# Patient Record
Sex: Female | Born: 1935 | Race: White | Hispanic: No | Smoking: Never smoker
Health system: Southern US, Community
[De-identification: ages and names within clinical notes are randomized; demographics above are authoritative.]

## PROBLEM LIST (undated history)

## (undated) DIAGNOSIS — M810 Age-related osteoporosis without current pathological fracture: Secondary | ICD-10-CM

## (undated) DIAGNOSIS — M1990 Unspecified osteoarthritis, unspecified site: Secondary | ICD-10-CM

## (undated) DIAGNOSIS — R7303 Prediabetes: Secondary | ICD-10-CM

## (undated) DIAGNOSIS — K219 Gastro-esophageal reflux disease without esophagitis: Secondary | ICD-10-CM

## (undated) DIAGNOSIS — Z9289 Personal history of other medical treatment: Secondary | ICD-10-CM

## (undated) DIAGNOSIS — I480 Paroxysmal atrial fibrillation: Secondary | ICD-10-CM

## (undated) DIAGNOSIS — I2510 Atherosclerotic heart disease of native coronary artery without angina pectoris: Secondary | ICD-10-CM

## (undated) DIAGNOSIS — R29898 Other symptoms and signs involving the musculoskeletal system: Secondary | ICD-10-CM

## (undated) DIAGNOSIS — Z79899 Other long term (current) drug therapy: Secondary | ICD-10-CM

## (undated) DIAGNOSIS — I739 Peripheral vascular disease, unspecified: Secondary | ICD-10-CM

## (undated) DIAGNOSIS — S22000A Wedge compression fracture of unspecified thoracic vertebra, initial encounter for closed fracture: Secondary | ICD-10-CM

## (undated) DIAGNOSIS — C541 Malignant neoplasm of endometrium: Secondary | ICD-10-CM

## (undated) DIAGNOSIS — I495 Sick sinus syndrome: Secondary | ICD-10-CM

## (undated) DIAGNOSIS — G459 Transient cerebral ischemic attack, unspecified: Secondary | ICD-10-CM

## (undated) DIAGNOSIS — R5382 Chronic fatigue, unspecified: Secondary | ICD-10-CM

## (undated) DIAGNOSIS — R609 Edema, unspecified: Secondary | ICD-10-CM

## (undated) DIAGNOSIS — Z950 Presence of cardiac pacemaker: Secondary | ICD-10-CM

## (undated) DIAGNOSIS — G629 Polyneuropathy, unspecified: Secondary | ICD-10-CM

## (undated) DIAGNOSIS — I517 Cardiomegaly: Secondary | ICD-10-CM

## (undated) DIAGNOSIS — I482 Chronic atrial fibrillation, unspecified: Secondary | ICD-10-CM

## (undated) DIAGNOSIS — I773 Arterial fibromuscular dysplasia: Secondary | ICD-10-CM

## (undated) DIAGNOSIS — E785 Hyperlipidemia, unspecified: Secondary | ICD-10-CM

## (undated) DIAGNOSIS — I150 Renovascular hypertension: Secondary | ICD-10-CM

## (undated) DIAGNOSIS — E871 Hypo-osmolality and hyponatremia: Secondary | ICD-10-CM

## (undated) DIAGNOSIS — G500 Trigeminal neuralgia: Secondary | ICD-10-CM

## (undated) DIAGNOSIS — L570 Actinic keratosis: Secondary | ICD-10-CM

## (undated) DIAGNOSIS — S72001A Fracture of unspecified part of neck of right femur, initial encounter for closed fracture: Secondary | ICD-10-CM

## (undated) DIAGNOSIS — I10 Essential (primary) hypertension: Secondary | ICD-10-CM

## (undated) DIAGNOSIS — IMO0002 Reserved for concepts with insufficient information to code with codable children: Secondary | ICD-10-CM

## (undated) HISTORY — DX: Transient cerebral ischemic attack, unspecified: G45.9

## (undated) HISTORY — DX: Renovascular hypertension: I15.0

## (undated) HISTORY — DX: Atherosclerotic heart disease of native coronary artery without angina pectoris: I25.10

## (undated) HISTORY — DX: Age-related osteoporosis without current pathological fracture: M81.0

## (undated) HISTORY — DX: Unspecified osteoarthritis, unspecified site: M19.90

## (undated) HISTORY — DX: Actinic keratosis: L57.0

## (undated) HISTORY — DX: Other symptoms and signs involving the musculoskeletal system: R29.898

## (undated) HISTORY — DX: Other long term (current) drug therapy: Z79.899

## (undated) HISTORY — DX: Peripheral vascular disease, unspecified: I73.9

## (undated) HISTORY — DX: Polyneuropathy, unspecified: G62.9

## (undated) HISTORY — DX: Trigeminal neuralgia: G50.0

## (undated) HISTORY — DX: Reserved for concepts with insufficient information to code with codable children: IMO0002

## (undated) HISTORY — DX: Personal history of other medical treatment: Z92.89

## (undated) HISTORY — DX: Hypo-osmolality and hyponatremia: E87.1

## (undated) HISTORY — DX: Prediabetes: R73.03

## (undated) HISTORY — DX: Essential (primary) hypertension: I10

## (undated) HISTORY — PX: CORONARY ANGIOPLASTY: SHX604

## (undated) HISTORY — DX: Sick sinus syndrome: I49.5

## (undated) HISTORY — PX: LUMBAR DISC SURGERY: SHX700

## (undated) HISTORY — DX: Gastro-esophageal reflux disease without esophagitis: K21.9

## (undated) HISTORY — DX: Paroxysmal atrial fibrillation: I48.0

## (undated) HISTORY — DX: Fracture of unspecified part of neck of right femur, initial encounter for closed fracture: S72.001A

## (undated) HISTORY — DX: Chronic atrial fibrillation, unspecified: I48.20

## (undated) HISTORY — DX: Chronic fatigue, unspecified: R53.82

## (undated) HISTORY — DX: Malignant neoplasm of endometrium: C54.1

## (undated) HISTORY — DX: Cardiomegaly: I51.7

## (undated) HISTORY — DX: Wedge compression fracture of unspecified thoracic vertebra, initial encounter for closed fracture: S22.000A

## (undated) HISTORY — DX: Hyperlipidemia, unspecified: E78.5

## (undated) HISTORY — DX: Arterial fibromuscular dysplasia: I77.3

## (undated) HISTORY — DX: Edema, unspecified: R60.9

---
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
## 1987-04-08 HISTORY — PX: CHOLECYSTECTOMY: SHX55

## 1988-04-07 HISTORY — PX: VAGINAL HYSTERECTOMY: SUR661

## 1988-04-07 HISTORY — PX: TOTAL ABDOMINAL HYSTERECTOMY W/ BILATERAL SALPINGOOPHORECTOMY: SHX83

## 1992-04-07 DIAGNOSIS — G500 Trigeminal neuralgia: Secondary | ICD-10-CM

## 1992-04-07 HISTORY — PX: RENAL ARTERY ANGIOPLASTY: SHX2317

## 1992-04-07 HISTORY — DX: Trigeminal neuralgia: G50.0

## 1997-09-13 HISTORY — PX: RENAL ARTERY ANGIOPLASTY: SHX2317

## 1997-09-14 HISTORY — PX: RENAL ANGIOGRAM: SHX6061

## 1998-10-03 ENCOUNTER — Ambulatory Visit: Admission: RE | Admit: 1998-10-03 | Discharge: 1998-10-03 | Payer: Self-pay | Admitting: Gynecology

## 1998-10-03 ENCOUNTER — Other Ambulatory Visit: Admission: RE | Admit: 1998-10-03 | Discharge: 1998-10-03 | Payer: Self-pay | Admitting: Gynecology

## 1998-10-18 ENCOUNTER — Ambulatory Visit (HOSPITAL_COMMUNITY): Admission: RE | Admit: 1998-10-18 | Discharge: 1998-10-18 | Payer: Self-pay | Admitting: Gynecology

## 1998-10-18 ENCOUNTER — Encounter: Payer: Self-pay | Admitting: Gynecology

## 2000-04-07 HISTORY — PX: SPINAL FUSION: SHX223

## 2001-09-05 HISTORY — PX: ABDOMINAL AORTAGRAM: SHX5706

## 2001-11-05 HISTORY — PX: CORONARY ANGIOPLASTY WITH STENT PLACEMENT: SHX49

## 2001-11-05 HISTORY — PX: CARDIAC CATHETERIZATION: SHX172

## 2004-12-11 ENCOUNTER — Ambulatory Visit: Payer: Self-pay | Admitting: Urgent Care

## 2004-12-17 ENCOUNTER — Ambulatory Visit: Payer: Self-pay | Admitting: Internal Medicine

## 2004-12-17 ENCOUNTER — Ambulatory Visit (HOSPITAL_COMMUNITY): Admission: RE | Admit: 2004-12-17 | Discharge: 2004-12-17 | Payer: Self-pay | Admitting: Internal Medicine

## 2004-12-23 ENCOUNTER — Encounter (HOSPITAL_COMMUNITY): Admission: RE | Admit: 2004-12-23 | Discharge: 2004-12-23 | Payer: Self-pay | Admitting: Internal Medicine

## 2005-02-03 ENCOUNTER — Ambulatory Visit: Payer: Self-pay | Admitting: Internal Medicine

## 2005-08-15 ENCOUNTER — Ambulatory Visit (HOSPITAL_COMMUNITY): Admission: RE | Admit: 2005-08-15 | Discharge: 2005-08-15 | Payer: Self-pay | Admitting: Gynecology

## 2005-08-21 ENCOUNTER — Ambulatory Visit: Payer: Self-pay | Admitting: Internal Medicine

## 2006-05-22 ENCOUNTER — Encounter: Admission: RE | Admit: 2006-05-22 | Discharge: 2006-06-23 | Payer: Self-pay | Admitting: Unknown Physician Specialty

## 2006-06-24 ENCOUNTER — Encounter: Admission: RE | Admit: 2006-06-24 | Discharge: 2006-07-03 | Payer: Self-pay | Admitting: Unknown Physician Specialty

## 2006-08-06 ENCOUNTER — Ambulatory Visit: Payer: Self-pay | Admitting: Cardiology

## 2006-08-06 DIAGNOSIS — S72001A Fracture of unspecified part of neck of right femur, initial encounter for closed fracture: Secondary | ICD-10-CM

## 2006-08-06 HISTORY — DX: Fracture of unspecified part of neck of right femur, initial encounter for closed fracture: S72.001A

## 2007-05-11 ENCOUNTER — Ambulatory Visit: Payer: Self-pay | Admitting: Internal Medicine

## 2008-08-02 ENCOUNTER — Ambulatory Visit: Payer: Self-pay | Admitting: Cardiology

## 2010-05-28 ENCOUNTER — Ambulatory Visit (INDEPENDENT_AMBULATORY_CARE_PROVIDER_SITE_OTHER): Payer: Self-pay | Admitting: Internal Medicine

## 2010-06-12 ENCOUNTER — Ambulatory Visit (INDEPENDENT_AMBULATORY_CARE_PROVIDER_SITE_OTHER): Payer: Self-pay | Admitting: Internal Medicine

## 2010-06-18 ENCOUNTER — Ambulatory Visit (INDEPENDENT_AMBULATORY_CARE_PROVIDER_SITE_OTHER): Payer: MEDICARE | Admitting: Internal Medicine

## 2010-06-18 DIAGNOSIS — R1013 Epigastric pain: Secondary | ICD-10-CM

## 2010-06-18 DIAGNOSIS — K219 Gastro-esophageal reflux disease without esophagitis: Secondary | ICD-10-CM

## 2010-08-20 NOTE — Assessment & Plan Note (Signed)
NAME:  Karina [name], Karina [name]              CHART#:  [id number]   DATE:                                   DOB:  [birth year]   FOLLOWUP:  History of GERD/reflux esophagitis, last seen Aug 21, 2005 at  which time she was doing well on Nexium 40 mg orally daily.  She stopped  taking that agent along the way and has had no recurrent reflux  symptoms, although she had cough previously that recurred.  She went on  some OTC Prilosec, and 2 months later the cough resolved.  She wonders  if the coughs are related to reflux.  No odynophagia, or dysphagia, no  abdominal pain.  She has been intermittently constipated and has not  passed any blood per rectum.   Past history of uterine cancer.  She has never had her lower GI tract  imaged.  We talked about this previously.  She previously was not  interested in having the colonoscopy and at this point is not interested  in having a colonoscopy.  We discussed virtual colonoscopy and option  last time, and again discussed it now and she states she wants to think  about it.,  There is no family history of colorectal neoplasia.  She may  go a couple of days now without a bowel movement, which is somewhat  unusual, but again has not passed any blood per rectum.  She has  actually gained 4 pounds since February 03, 2005.  She weighed 126.5  pounds on Aug 21, 2005.  She is now up 2 pounds from that weight.   CURRENT MEDICATIONS:  See updated list.   ALLERGIES:  FLAVORIL.   She tells me that she was on vacation in [place] since she was last seen  here.  She was seen by gastroenterologist in [place] who treated her for  impaction.   PHYSICAL EXAMINATION:  Exam today, a pleasant, 77 year old lady, resting  comfortably.  Weight 128.5.  Height 5 feet 1-1/2-inches.  Temp 98.  BP  150/90.  Pulse 56.  SKIN:  Warm and dry.  ABDOMEN:  Flat.  Positive bowel sounds.  Soft, nontender.  No  appreciable hepatosplenomegaly.  CHEST:  Lungs clear to auscultation.  CARDIAC:   Regular rate and rhythm, without murmur, gallop, or rub.  RECTAL:  Good sphincter tone.  No masses in the rectal vault.  No stool  in the rectal vault.  Mucus hemoccult negative.   ASSESSMENT:  1. Gastroesophageal reflux disease.  History of reflux esophagitis      quiescent on omeprazole.  She has got recurrent cough which      loosely, temporarily improves with acid suppression therapy.  I      told Ms. Propes she may or may not have supraesophageal reflux      to account for her cough, but I told her to continue on Prilosec      for another month or two and then she might try backing off  and      see if the cough occurs.  If it does, and it again resolves, once      going back on Prilosec, we pretty much have the answer, and if that      is the case, she will have to pretty much stay on Prilosec  indefinitely.  2. Altered bowel function without any alarm features.  I recommend      that she try Colace stool softener 100 mg orally twice daily and      MiraLax 1 capful with 8 ounces of water at bedtime if she has gone      the day without a bowel movement.  I again emphasize the importance      of having her colon imaged, if not a colonoscopy which is what I      primarily recommend.  She ought to get a virtual colonoscopy and if      she decides to have that, we can certainly set her up.  At this      point, however, she wants to think about.  She seems to understand      the risks, benefits and alternatives, the importance of imaging her      colon has been emphasized today and previously.  At this point do      not have any scheduled followup appointment with this nice lady.  I      told her touch base with Dr. Sherryll Burger as scheduled and if anything      changes she is to let me know.       Jonathon Bellows, M.D.  Electronically Signed     RMR/MEDQ  D:  05/11/2007  T:  05/12/2007  Job:  [id number]   cc:   Kirstie Peri, MD

## 2010-08-23 NOTE — Op Note (Signed)
NAME:  Karina [name], Karina [name]             ACCOUNT NO.:  [id number]   MEDICAL RECORD NO.:  [id number]          PATIENT TYPE:  AMB   LOCATION:  DAY                           FACILITY:  [facility]   PHYSICIAN:  R. Roetta Sessions, M.D. DATE OF BIRTH:  Oct 02, 1922   DATE OF PROCEDURE:  12/17/2004  DATE OF DISCHARGE:                                 OPERATIVE REPORT   PROCEDURE:  Diagnostic esophagogastroduodenoscopy.   INDICATIONS FOR PROCEDURE:  A 77 year old lady with chronic gastroesophageal  reflux disease, now with a 4-1/2 month history of anorexia and intermittent  epigastric pain and intermittent diarrhea. Her blood pressure is said to  have been labile recently which Dr. Raul Del asked that we hold off on doing a  colonoscopy (she has never had a colonoscopy). Stool studies reportedly came  back negative. She had a CT of the abdomen per her report recently when she  demonstrated no abnormalities.   She tells me she was started on Nexium 40 mg orally daily throughout our  office just a few days ago. Otherwise, she has not been on any acid-  suppression therapy apparently. EGD is now being done to further evaluate  her symptoms. She does not have any odynophagia or dysphagia.   PROCEDURE NOTE:  O2 saturation, blood pressure, pulse, and respirations were  monitored throughout the entire procedure. Conscious sedation with Versed  2.5 mg IV, fentanyl 50 mcg IV prior to the procedure. Cetacaine spray for  topical oropharyngeal anesthesia.   INSTRUMENT:  Olympus video chip system.   FINDINGS:  Examination of the tubular esophagus revealed 2 to 3 mm erosions  straddling the EG junction. Esophageal mucosa otherwise appeared normal. EG  junction easily traversed.   Stomach:  Gastric cavity insufflated well with air. The gastric mucosa had  almost a yellow appearance as there was copious amount of thick tenacious  bile staining a good portion of the gastric mucosa. There was no food in the  stomach.  Thorough examination of gastric mucosa including retroflexed view  of the proximal stomach demonstrated small hiatal hernia. Copious gastric  washing/lavage was performed to get a good view of the mucosa. There was  some fine mucosa hemorrhages somewhat diffusely and patchy erythema  underlying the bile. There was no erosion or ulcer crater or infiltrating  process seen. Pylorus patent and easily traversed. Examination of bulb and  second portion revealed no abnormalities.   THERAPEUTIC/DIAGNOSTIC MANEUVERS:  None.   The patient tolerated the procedure well and was reactive to endoscopy.   IMPRESSION:  Two quadrant distal esophageal erosions consistent with erosive  reflux esophagitis. Otherwise normal esophagus. Small hiatal hernia. Much  bile stained gastric mucosa with underlying inflammatory changes as  described above. No ulcer or evidence of gastric neoplasm. Patent pylorus.  Normal D1 and D2.   Gastric emptying may be an issue in this nice lady.   RECOMMENDATIONS:  1.  Continue Nexium 40 mg orally daily.  2.  Go ahead and check Helicobacter pylori serology.  3.  Next step will be solid-phase gastric emptying study to further access      gastric emptying.  It is notable Ms. Kintz told me that she weighed approximately 124  pounds 1 year ago, and she weighed 125.5 pounds in our office recently.  Further recommendations to follow.      Jonathon Bellows, M.D.  Electronically Signed     RMR/MEDQ  D:  12/17/2004  T:  12/17/2004  Job:  [id number]   cc:   Nena Jordan

## 2010-08-23 NOTE — Consult Note (Signed)
NAME:  Karina [name], Karina [name]             ACCOUNT NO.:  [id number]   MEDICAL RECORD NO.:  [id number]           PATIENT TYPE:  AMB   LOCATION:  DAY                           FACILITY:  [facility]   PHYSICIAN:  R. Roetta Sessions, M.D. DATE OF BIRTH:  1923/03/21   DATE OF CONSULTATION:  DATE OF DISCHARGE:                                   CONSULTATION   GASTROINTESTINAL CONSULTATION:   REQUESTING PHYSICIAN:  Dr. Eliberto Ivory   REASON FOR CONSULTATION:  Chronic diarrhea, upper abdominal pain.   HISTORY OF PRESENT ILLNESS:  [name] is a 77 year old, Caucasian  female who reports a 4-1/2 months history of upper abdominal pain along with  what she describes as an aching.  She also has postprandial diarrhea.  She  can have anywhere upwards of 5 stools per day.  She denies any rectal  bleeding or melena.  She is also complaining of anorexia.  She has had stool  studies through Dr. Magnus Ivan office which were negative per her report.  She  denies any nausea or vomiting.  She does have some heartburn and  indigestion.  She has had chronic GERD symptoms for years, currently off of  PPI therapy.  She denies any dysphagia or odynophagia.  She has never had a  screening colonoscopy.   She had a CT scan of the abdomen and pelvis with contrast on Aug 12, 2004.  She had atherosclerotic disease and mild aneurysmal dilatation of the  infrarenal abdominal aorta and very small low density lesions involving the  liver which were too small to fully characterize given streaked artifact.   PAST MEDICAL HISTORY:  1.  Hypertension.  2.  Angioplasty.  3.  Peripheral neuropathy.  4.  Tic douloureux neuralgia.  5.  Chronic low back pain, maintained on methadone.  6.  Uterine carcinoma diagnosed in 1990, status post complete hysterectomy      and treatment at [facility].  7.  Appendectomy.  8.  C-section x2.  9.  Cholecystectomy.  10. Back surgery in January of 2002.  11. She has history of ileus.  12. Tonsillectomy x2.  13. Left elbow surgery.   CURRENT MEDICATIONS:  1.  Diovan 300 mg daily.  2.  Cardizem CD 240 mg daily.  3.  Neurontin 100 mg twice daily.  4.  Clonidine 0.1 mg q.a.m. and nightly.  5.  Tegretol 100 mg twice daily.  6.  Zocor 40 mg daily.  7.  Toprol-XL 50 mg daily.  8.  Aspirin 81 mg daily.  9.  Multivitamin daily.  10. Tylenol 1-2 p.r.n.  11. Methadone 5 mg daily.  12. Mylanta p.r.n.   ALLERGIES:  FAV RIL.   FAMILY HISTORY:  Positive for a brother with Crohn's disease diagnosed in  his 61s or 75s, she is unsure.  Mother deceased at age 61.  Father deceased  at age 14 due to CVA.   SOCIAL HISTORY:  [name] has been married for 54 years.  She has 2  grown and healthy children.  She was an Animator.  She denies any  tobacco or drug use.  She  reports rare alcohol use.   REVIEW OF SYSTEMS:  CONSTITUTIONAL:  Weight is stable.  She is complaining  of some fatigue and malaise.  She denies any fevers or chills.  NEUROLOGIC:  She reports a fall about a week ago.  When she fell, she had more  significant epigastric pain.  She was seen by her PCP and given an order for  an x-ray which she has not done at this time as the pain has been better.  She denies any headaches or diplopia or visual changes.  CARDIOVASCULAR:  She denies any chest pain or palpitations.  PULMONARY:  No shortness of  breath, dizziness, cough or hemoptysis.  GI:  See HPI.   PHYSICAL EXAMINATION:  VITAL SIGNS:  Weight 125.5 pounds, height 62 inches,  temperature 98.4, blood pressure 146/80, pulse 68.  GENERAL:  [name] is a 77 year old, Caucasian female who is alert and  oriented, pleasant, and cooperative, in no acute distress.  HEENT:  Clear.  Conjunctivae pink.  Oropharynx pink and moist without  lesions.  NECK:  Supple without lymphadenopathy or thyromegaly.  CHEST:  Heart is a regular rate and rhythm with normal S1 and S2.  No  murmurs, rubs or gallops.  LUNGS:  Clear to auscultation  bilaterally.  ABDOMEN:  Positive bowel sounds, soft in 4 quadrants.  Non-tender.  Without  palpable masses or hepatosplenomegaly.  No rebound, tenderness or guarding.  EXTREMITIES:  Without edema bilaterally.  RECTAL:  Was deferred.   IMPRESSION:  [name] is a 77 year old, Caucasian female with history  of chronic gastroesophageal reflux disease who now presents with a 4-1/2  months history of anorexia, epigastric pain, and chronic diarrhea.  Her  diarrhea is intermittent, generally postprandially.  It could be related to  irritable bowel syndrome, however, new onset given her age warrants further  evaluation and she has never had a colonoscopy which needs to be done  initially.  She also has a family history of inflammatory bowel disease  which would be less likely.  As far as her chronic GERD is concerned, she is  going to need chronic GERD, anorexia, and epigastric pain she is going to  need an EGD for further evaluation to rule out complications of chronic  GERD.   PLAN:  1.  EGD and colonoscopy by R. Roetta Sessions, M.D. in the near future.  I      have discussed the procedure including the risks and benefits which      included _________, perforation, drug reaction.  She agrees __________      gives consent.  She was told to stop aspirin for 3 days prior to the      procedures.  2.  Further recommendations pending procedures.   We would like to thank Dr. Eliberto Ivory for referring [name].   Dictated by Nicholas Lose, N.P. for R. Roetta Sessions, M.D.      Nicholas Lose, N.P.      Elvera Lennox Roetta Sessions, M.D.  Electronically Signed    KC/MEDQ  D:  12/11/2004  T:  12/11/2004  Job:  [id number]

## 2010-09-06 DIAGNOSIS — Z9289 Personal history of other medical treatment: Secondary | ICD-10-CM

## 2010-09-06 HISTORY — DX: Personal history of other medical treatment: Z92.89

## 2010-09-06 HISTORY — PX: OTHER SURGICAL HISTORY: SHX169

## 2011-01-21 DIAGNOSIS — R7303 Prediabetes: Secondary | ICD-10-CM

## 2011-01-21 HISTORY — DX: Prediabetes: R73.03

## 2011-07-04 ENCOUNTER — Other Ambulatory Visit (INDEPENDENT_AMBULATORY_CARE_PROVIDER_SITE_OTHER): Payer: Self-pay | Admitting: Internal Medicine

## 2011-08-06 DIAGNOSIS — G459 Transient cerebral ischemic attack, unspecified: Secondary | ICD-10-CM

## 2011-08-06 HISTORY — DX: Transient cerebral ischemic attack, unspecified: G45.9

## 2011-10-15 DIAGNOSIS — R609 Edema, unspecified: Secondary | ICD-10-CM

## 2011-10-15 HISTORY — DX: Edema, unspecified: R60.9

## 2012-05-08 DIAGNOSIS — Z9289 Personal history of other medical treatment: Secondary | ICD-10-CM

## 2012-05-08 HISTORY — DX: Personal history of other medical treatment: Z92.89

## 2012-08-08 DIAGNOSIS — I495 Sick sinus syndrome: Secondary | ICD-10-CM

## 2012-08-10 DIAGNOSIS — I4891 Unspecified atrial fibrillation: Secondary | ICD-10-CM

## 2012-09-02 DIAGNOSIS — Z79899 Other long term (current) drug therapy: Secondary | ICD-10-CM

## 2012-09-02 HISTORY — DX: Other long term (current) drug therapy: Z79.899

## 2012-09-27 DIAGNOSIS — R29898 Other symptoms and signs involving the musculoskeletal system: Secondary | ICD-10-CM

## 2012-09-27 HISTORY — DX: Other symptoms and signs involving the musculoskeletal system: R29.898

## 2013-03-25 ENCOUNTER — Encounter: Payer: Self-pay | Admitting: Cardiology

## 2013-03-26 ENCOUNTER — Encounter: Payer: Self-pay | Admitting: Cardiology

## 2013-04-24 ENCOUNTER — Other Ambulatory Visit (INDEPENDENT_AMBULATORY_CARE_PROVIDER_SITE_OTHER): Payer: Self-pay | Admitting: Internal Medicine

## 2013-06-24 LAB — PROTIME-INR: INR: 2.3 — AB (ref 0.9–1.1)

## 2013-07-14 ENCOUNTER — Encounter: Payer: Self-pay | Admitting: *Deleted

## 2013-07-18 ENCOUNTER — Ambulatory Visit: Payer: Medicare Other | Admitting: Cardiology

## 2013-07-27 ENCOUNTER — Ambulatory Visit: Payer: Medicare Other | Admitting: Cardiology

## 2013-09-14 ENCOUNTER — Institutional Professional Consult (permissible substitution): Payer: Medicare Other | Admitting: Cardiology

## 2013-09-15 ENCOUNTER — Other Ambulatory Visit: Payer: Self-pay

## 2013-09-22 ENCOUNTER — Encounter: Payer: Self-pay | Admitting: Internal Medicine

## 2013-09-22 ENCOUNTER — Ambulatory Visit (INDEPENDENT_AMBULATORY_CARE_PROVIDER_SITE_OTHER): Payer: Medicare Other | Admitting: Internal Medicine

## 2013-09-22 VITALS — BP 127/84 | HR 78 | Ht 61.5 in | Wt 126.6 lb

## 2013-09-22 DIAGNOSIS — I498 Other specified cardiac arrhythmias: Secondary | ICD-10-CM

## 2013-09-22 DIAGNOSIS — I4819 Other persistent atrial fibrillation: Secondary | ICD-10-CM

## 2013-09-22 DIAGNOSIS — E785 Hyperlipidemia, unspecified: Secondary | ICD-10-CM | POA: Insufficient documentation

## 2013-09-22 DIAGNOSIS — Z950 Presence of cardiac pacemaker: Secondary | ICD-10-CM

## 2013-09-22 DIAGNOSIS — I4891 Unspecified atrial fibrillation: Secondary | ICD-10-CM

## 2013-09-22 DIAGNOSIS — I10 Essential (primary) hypertension: Secondary | ICD-10-CM | POA: Insufficient documentation

## 2013-09-22 DIAGNOSIS — R001 Bradycardia, unspecified: Secondary | ICD-10-CM

## 2013-09-22 DIAGNOSIS — I2510 Atherosclerotic heart disease of native coronary artery without angina pectoris: Secondary | ICD-10-CM

## 2013-09-22 NOTE — Assessment & Plan Note (Signed)
Her Biotronik DDD PM was interrogated. She has chronic atrial fib and her rates are well controlled. Her device is working normally.

## 2013-09-22 NOTE — Assessment & Plan Note (Signed)
She notes that she has had trouble with her blood pressure in the past. We discussed the possibility of switching to carvedilol but she tells me she has tried this medication in the past and not tolerated it. Her pressure is well controlled

## 2013-09-22 NOTE — Assessment & Plan Note (Addendum)
Her ventricular rates appear to be well controlled. She is pacing 88% of the time. She mentioned that she was scheduled to see a [facility] EP MD to discuss AV node ablation. Based on the PPM histograms I advised against AV node ablation. She stated that she was not inclined to keep this appointment. She is appropriately anti-coagulated with warfarin.

## 2013-09-22 NOTE — Assessment & Plan Note (Signed)
She denies any anginal symptoms. Because she appears to be having some fatigue on her metoprolol, and because her rates have been well controlled based on her PPM histograms, I have asked her to reduce her dose of metoprolol to 12.5 mg twice daily. She will call if she has any anginal symptoms.

## 2013-09-22 NOTE — Progress Notes (Signed)
HPI Karina [name] is referred today by Dr. Alethia Berthold for ongoing treatment of her atrial fibrillation. She is a very pleasant 80 yo [name] with a h/o atrial fibrillation, symptomatic bradycardia, CAD, s/p PTCA and stenting of her LAD remotely, who developed atrial fibrillation a couple of years ago which has no been persistent. She was tried on both Tikosyn and amiodarone without success and is now on a strategy of rate control. Overall she has very few complaints today except that she thinks the metoprolol makes her fatigued. She denies peripheral edema or syncope. She has peripheral neuropathy. She had a Biotronik DDD PM placed. Allergies  Allergen Reactions  . Amiodarone Other (See Comments)    Worsening peripheral neuropathy  . Ace Inhibitors Hives  . Meperidine And Related   . Prednisone   . Tikosyn [Dofetilide] Other (See Comments)    QT prolongation     Current Outpatient Prescriptions  Medication Sig Dispense Refill  . aspirin EC 81 MG tablet Take 81 mg by mouth daily.      . calcium carbonate (TUMS) 500 MG chewable tablet Chew 3 tablets by mouth daily.      . cloNIDine (CATAPRES) 0.1 MG tablet Take 1 tablet in the morning and 2 tablets at night      . diltiazem (CARDIZEM CD) 120 MG 24 hr capsule Take 1 capsule by mouth 2 (two) times daily.      . Ergocalciferol (VITAMIN D2) 2000 UNITS TABS Take 1 tablet by mouth daily. Pt states that she takes D2 & D3 interchangeably      . furosemide (LASIX) 20 MG tablet Take 20 mg by mouth as needed.       . metoprolol tartrate (LOPRESSOR) 25 MG tablet Take 1 tablet by mouth 2 (two) times daily.      . nitroGLYCERIN (NITROSTAT) 0.4 MG SL tablet Place 0.4 mg under the tongue as directed.      . pantoprazole (PROTONIX) 40 MG tablet Take 40 mg by mouth as needed.       Vladimir Faster Glycol-Propyl Glycol (SYSTANE) 0.4-0.3 % SOLN Apply 1 drop to eye 4 (four) times daily.      . potassium chloride (K-DUR) 10 MEQ tablet Take 10 mEq by mouth daily.       . rosuvastatin (CRESTOR) 5 MG tablet Take 5 mg by mouth daily.      . valsartan (DIOVAN) 320 MG tablet Take 320 mg by mouth daily.      . vitamin B-12 (CYANOCOBALAMIN) 100 MCG tablet Take 100 mcg by mouth daily.      Marland Kitchen warfarin (COUMADIN) 2 MG tablet Take 2 mg by mouth as directed.       No current facility-administered medications for this visit.     Past Medical History  Diagnosis Date  . Paroxysmal atrial fibrillation   . TIA (transient ischemic attack) 08/2011    OSH: flushing, left LE numbness, CT negative  . CAD (coronary artery disease)     EF 55% by 2 D Echo 2008 and 2012  . Peripheral neuropathy   . Peripheral vascular disease   . Fibromuscular dysplasia     S/P PCI right renal aretery 1999  . Hx of echocardiogram 05/2012     EF >55%, LA 3.9 cm, mild LVH  . LVH (left ventricular hypertrophy)     Mild  . Hx of cardiovascular stress test 09/2010    Nuclear stress testing showing no evidence of ischemia, EF=76%, No EKG changes &  no perfusion defects.  . Chronic atrial fibrillation   . Pre-diabetes 01/21/11    Chronic  . Renovascular hypertension   . Hyperlipidemia   . Tic douloureux 1994    S/P successful surgery  . Osteoporosis     With Hx of thoracic compression fractures  . Thoracic compression fracture   . DDD (degenerative disc disease)   . Osteoarthritis   . Endometrial carcinoma   . 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 Chronic fatigue   . Actinic keratosis   . Hip fracture, right 08/06/06    S/P surgery  . Chronic edema 10/15/11  . GERD (gastroesophageal reflux disease)   . Tachy-brady syndrome   . Bilateral leg weakness 09/27/12  . Encounter for long-term (current) use of other medications 09/02/12  . Hypertension     Difficult to control    ROS:   All systems reviewed and negative except as noted in the HPI.   Past Surgical History  Procedure Laterality Date  . Cardiac catheterization  11/05/01    EF 72%. RCA  25%. LCX 50%. Ramus 75/100%. LAD 95%. D2 75%.  . Cholecystectomy  1989  . Vaginal hysterectomy  1990  . Appendectomy  1946  . Lumbar disc surgery    . Renal artery angioplasty Right 1994    Renal artery stenosis secondary to fibromusclar dysplasia  . Renal artery angioplasty Right 09/13/97    PTA/Stent to ostial right renal artery, No distal fibromuscular hyperplasia  . Renal angiogram  09/14/97    25% diffuse left renal artery. 50% ostia; right renal artery.  . Abdominal aortagram  09/05/01    Right & left renals 25%  . Dipyridamole stress test  09/2010    Nuclear stress testing showing no evidence of ischemia, EF=76%, No EKG changes & no perfusion defects.  . Cesarean section  1959  . Cesarean section  1961  . Spinal fusion  2002    Percutaneous spinal fusion  . Coronary angioplasty    . Total abdominal hysterectomy w/ bilateral salpingoophorectomy  1990  . Coronary angioplasty with stent placement  11/05/01    PTCA/Stent to 95% mid LAD, Ramus lesion could not be crossed and was not treated.      Family History  Problem Relation Age of Onset  . COPD Brother   . Diabetes Brother     DM  . Hypertension Brother   . Stroke Father   . Stroke Other      History   Social History  . Marital Status: Married    Spouse Name: N/A    Number of Children: N/A  . Years of Education: N/A   Occupational History  . Not on file.   Social History Main Topics  . Smoking status: Never Smoker   . Smokeless tobacco: Never Used  . Alcohol Use: Yes     Comment: Rarely  . Drug Use: No  . Sexual Activity: Not on file   Other Topics Concern  . Not on file   Social History Narrative  . No narrative on file     BP 127/84  Pulse 78  Ht 5' 1.5" (1.562 m)  Wt 126 lb 9.6 oz (57.425 kg)  BMI 23.54 kg/m2  Physical Exam:  Well appearing Karina [name], NAD HEENT: Unremarkable Neck:  No JVD, no thyromegally Back:  No CVA tenderness Lungs:  Clear with no wheezes HEART:  Regular rate  rhythm, no murmurs, no rubs, no clicks Abd:  soft, positive bowel sounds, no organomegally, no rebound, no guarding Ext:  2 plus pulses, no edema, no cyanosis, no clubbing Skin:  No rashes no nodules Neuro:  CN II through XII intact, motor grossly intact  EKG -  Atrial fibrillation with ventricular pacing  DEVICE  Normal device function.  See PaceArt for details. She is pacing in the ventricle 88% of the time.  Assess/Plan:

## 2013-09-22 NOTE — Patient Instructions (Signed)
Your physician recommends that you schedule a follow-up appointment in: 3 months in [place]

## 2013-09-26 LAB — MDC_IDC_ENUM_SESS_TYPE_INCLINIC
Battery Remaining Longevity: 11
Brady Statistic RV Percent Paced: 88 %
Lead Channel Pacing Threshold Amplitude: 0.5 V
Lead Channel Pacing Threshold Pulse Width: 0.4 ms
Lead Channel Sensing Intrinsic Amplitude: 12.5 mV
Lead Channel Setting Pacing Amplitude: 2.5 V
Lead Channel Setting Pacing Pulse Width: 0.4 ms
Lead Channel Setting Sensing Sensitivity: 2.5 mV
MDC IDC MSMT LEADCHNL RV IMPEDANCE VALUE: 682 Ohm
MDC IDC PG SERIAL: 68133668

## 2013-12-22 ENCOUNTER — Encounter: Payer: Self-pay | Admitting: Internal Medicine

## 2013-12-22 ENCOUNTER — Ambulatory Visit (INDEPENDENT_AMBULATORY_CARE_PROVIDER_SITE_OTHER): Payer: Medicare Other | Admitting: Internal Medicine

## 2013-12-22 VITALS — BP 116/90 | HR 92 | Ht 61.0 in | Wt 124.0 lb

## 2013-12-22 DIAGNOSIS — I4891 Unspecified atrial fibrillation: Secondary | ICD-10-CM

## 2013-12-22 DIAGNOSIS — I10 Essential (primary) hypertension: Secondary | ICD-10-CM

## 2013-12-22 DIAGNOSIS — Z950 Presence of cardiac pacemaker: Secondary | ICD-10-CM

## 2013-12-22 NOTE — Patient Instructions (Addendum)
Remote monitoring is used to monitor your Pacemaker of ICD from home. This monitoring reduces the number of office visits required to check your device to one time per year. It allows [place] to keep an eye on the functioning of your device to ensure it is working properly. You are scheduled for a device check from home on December 17th. You may send your transmission at any time that day. If you have a wireless device, the transmission will be sent automatically. After your physician reviews your transmission, you will receive a postcard with your next transmission date.  Your physician wants you to follow-up in: 6 months with Dr. Knox Saliva will receive a reminder letter in the mail two months in advance. If you don't receive a letter, please call our office to schedule the follow-up appointment.  Your physician recommends that you continue on your current medications as directed. Please refer to the Current Medication list given to you today.  Thank you for choosing [facility]!!

## 2013-12-22 NOTE — Assessment & Plan Note (Signed)
Her biotronik PPM is working normally. Will recheck in several months. We will start her on remote monitoring through our office.

## 2013-12-22 NOTE — Assessment & Plan Note (Signed)
She is doing well with a well controlled ventricular rate. No change in meds.

## 2013-12-22 NOTE — Assessment & Plan Note (Signed)
Her blood pressure today is well controlled. No change in meds.

## 2013-12-22 NOTE — Progress Notes (Signed)
HPI Karina [name] returns today for ongoing treatment of her atrial fibrillation. She is a very pleasant 80 yo woman with a h/o atrial fibrillation, symptomatic bradycardia, CAD, s/p PTCA and stenting of her LAD remotely, who developed atrial fibrillation a couple of years ago which has no been persistent. She was tried on both Tikosyn and amiodarone without success and is now on a strategy of rate control. She denies peripheral edema or syncope. She has peripheral neuropathy. She had a Biotronik DDD PM placed. In the interim she has done well but has been caring for her husband who has had some difficulties with his health. Allergies  Allergen Reactions  . Amiodarone Other (See Comments)    Worsening peripheral neuropathy  . Ace Inhibitors Hives  . Meperidine And Related   . Prednisone   . Tikosyn [Dofetilide] Other (See Comments)    QT prolongation     Current Outpatient Prescriptions  Medication Sig Dispense Refill  . aspirin EC 81 MG tablet Take 81 mg by mouth daily.      . calcium carbonate (TUMS) 500 MG chewable tablet Chew 3 tablets by mouth daily.      . cloNIDine (CATAPRES) 0.1 MG tablet Take 1 tablet in the morning and 2 tablets at night      . diltiazem (CARDIZEM CD) 120 MG 24 hr capsule Take 1 capsule by mouth daily.       . Ergocalciferol (VITAMIN D2) 2000 UNITS TABS Take 1 tablet by mouth daily. Pt states that she takes D2 & D3 interchangeably      . furosemide (LASIX) 20 MG tablet Take 20 mg by mouth as needed.       . metoprolol tartrate (LOPRESSOR) 25 MG tablet Take 1 tablet by mouth 2 (two) times daily.      . nitroGLYCERIN (NITROSTAT) 0.4 MG SL tablet Place 0.4 mg under the tongue as directed.      . pantoprazole (PROTONIX) 40 MG tablet Take 40 mg by mouth as needed.       Vladimir Faster Glycol-Propyl Glycol (SYSTANE) 0.4-0.3 % SOLN Apply 1 drop to eye 4 (four) times daily.      . potassium chloride (K-DUR) 10 MEQ tablet Take 10 mEq by mouth as needed.       .  rosuvastatin (CRESTOR) 5 MG tablet Take 5 mg by mouth daily.      . valsartan (DIOVAN) 320 MG tablet Take 320 mg by mouth daily.      Marland Kitchen warfarin (COUMADIN) 2 MG tablet Take 2 mg by mouth as directed.       No current facility-administered medications for this visit.     Past Medical History  Diagnosis Date  . Paroxysmal atrial fibrillation   . TIA (transient ischemic attack) 08/2011    OSH: flushing, left LE numbness, CT negative  . CAD (coronary artery disease)     EF 55% by 2 D Echo 2008 and 2012  . Peripheral neuropathy   . Peripheral vascular disease   . Fibromuscular dysplasia     S/P PCI right renal aretery 1999  . Hx of echocardiogram 05/2012     EF >55%, LA 3.9 cm, mild LVH  . LVH (left ventricular hypertrophy)     Mild  . Hx of cardiovascular stress test 09/2010    Nuclear stress testing showing no evidence of ischemia, EF=76%, No EKG changes & no perfusion defects.  . Chronic atrial fibrillation   . Pre-diabetes 01/21/11  Chronic  . Renovascular hypertension   . Hyperlipidemia   . Tic [place] 1994    S/P successful surgery  . Osteoporosis     With Hx of thoracic compression fractures  . Thoracic compression fracture   . DDD (degenerative disc disease)   . Osteoarthritis   . Endometrial carcinoma   . 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 Chronic fatigue   . Actinic keratosis   . Hip fracture, right 08/06/06    S/P surgery  . Chronic edema 10/15/11  . GERD (gastroesophageal reflux disease)   . Tachy-brady syndrome   . Bilateral leg weakness 09/27/12  . Encounter for long-term (current) use of other medications 09/02/12  . Hypertension     Difficult to control    ROS:   All systems reviewed and negative except as noted in the HPI.   Past Surgical History  Procedure Laterality Date  . Cardiac catheterization  11/05/01    EF 72%. RCA 25%. LCX 50%. Ramus 75/100%. LAD 95%. D2 75%.  . Cholecystectomy  1989  . Vaginal  hysterectomy  1990  . Appendectomy  1946  . Lumbar disc surgery    . Renal artery angioplasty Right 1994    Renal artery stenosis secondary to fibromusclar dysplasia  . Renal artery angioplasty Right 09/13/97    PTA/Stent to ostial right renal artery, No distal fibromuscular hyperplasia  . Renal angiogram  09/14/97    25% diffuse left renal artery. 50% ostia; right renal artery.  . Abdominal aortagram  09/05/01    Right & left renals 25%  . Dipyridamole stress test  09/2010    Nuclear stress testing showing no evidence of ischemia, EF=76%, No EKG changes & no perfusion defects.  . Cesarean section  1959  . Cesarean section  1961  . Spinal fusion  2002    Percutaneous spinal fusion  . Coronary angioplasty    . Total abdominal hysterectomy w/ bilateral salpingoophorectomy  1990  . Coronary angioplasty with stent placement  11/05/01    PTCA/Stent to 95% mid LAD, Ramus lesion could not be crossed and was not treated.      Family History  Problem Relation Age of Onset  . COPD Brother   . Diabetes Brother     DM  . Hypertension Brother   . Stroke Father   . Stroke Other      History   Social History  . Marital Status: Married    Spouse Name: N/A    Number of Children: N/A  . Years of Education: N/A   Occupational History  . Not on file.   Social History Main Topics  . Smoking status: Never Smoker   . Smokeless tobacco: Never Used  . Alcohol Use: Yes     Comment: Rarely  . Drug Use: No  . Sexual Activity: Not on file   Other Topics Concern  . Not on file   Social History Narrative  . No narrative on file     BP 116/90  Pulse 92  Ht 5\' 1"  (1.549 m)  Wt 124 lb (56.246 kg)  BMI 23.44 kg/m2  SpO2 98%  Physical Exam:  Well appearing 80 yo woman, NAD HEENT: Unremarkable Neck:  No JVD, no thyromegally Back:  No CVA tenderness Lungs:  Clear with no wheezes HEART:  Regular rate rhythm, no murmurs, no rubs, no clicks Abd:  soft, positive bowel sounds, no  organomegally, no rebound, no guarding Ext:  2 plus pulses,  no edema, no cyanosis, no clubbing Skin:  No rashes no nodules Neuro:  CN II through XII intact, motor grossly intact   DEVICE  Normal device function.  See PaceArt for details. She is pacing in the ventricle 97% of the time.  Assess/Plan:

## 2013-12-23 ENCOUNTER — Encounter: Payer: Self-pay | Admitting: Internal Medicine

## 2013-12-23 LAB — MDC_IDC_ENUM_SESS_TYPE_INCLINIC
Brady Statistic RV Percent Paced: 97 %
Date Time Interrogation Session: 20150917133600
Implantable Pulse Generator Serial Number: 68133668
Lead Channel Impedance Value: 682 Ohm
Lead Channel Pacing Threshold Amplitude: 0.5 V
Lead Channel Pacing Threshold Amplitude: 1.5 V
Lead Channel Pacing Threshold Pulse Width: 0.4 ms
Lead Channel Pacing Threshold Pulse Width: 0.4 ms
Lead Channel Sensing Intrinsic Amplitude: 13 mV
Lead Channel Setting Pacing Amplitude: 2.5 V
Lead Channel Setting Sensing Sensitivity: 2.5 mV
MDC IDC MSMT LEADCHNL RV PACING THRESHOLD AMPLITUDE: 0.5 V
MDC IDC MSMT LEADCHNL RV PACING THRESHOLD PULSEWIDTH: 0.4 ms
MDC IDC MSMT LEADCHNL RV SENSING INTR AMPL: 13 mV
MDC IDC SET LEADCHNL RV PACING PULSEWIDTH: 0.4 ms

## 2013-12-23 LAB — PACEMAKER DEVICE OBSERVATION

## 2014-01-15 ENCOUNTER — Encounter (HOSPITAL_COMMUNITY): Payer: Self-pay | Admitting: Emergency Medicine

## 2014-01-15 ENCOUNTER — Emergency Department (HOSPITAL_COMMUNITY): Payer: Medicare Other

## 2014-01-15 ENCOUNTER — Emergency Department (HOSPITAL_COMMUNITY)
Admission: EM | Admit: 2014-01-15 | Discharge: 2014-01-15 | Disposition: A | Discharge status: Home / Self Care | Payer: Medicare Other | Attending: Emergency Medicine | Admitting: Emergency Medicine

## 2014-01-15 DIAGNOSIS — J209 Acute bronchitis, unspecified: Secondary | ICD-10-CM | POA: Insufficient documentation

## 2014-01-15 DIAGNOSIS — M1990 Unspecified osteoarthritis, unspecified site: Secondary | ICD-10-CM | POA: Diagnosis not present

## 2014-01-15 DIAGNOSIS — Z8669 Personal history of other diseases of the nervous system and sense organs: Secondary | ICD-10-CM | POA: Diagnosis not present

## 2014-01-15 DIAGNOSIS — I480 Paroxysmal atrial fibrillation: Secondary | ICD-10-CM | POA: Diagnosis not present

## 2014-01-15 DIAGNOSIS — E785 Hyperlipidemia, unspecified: Secondary | ICD-10-CM | POA: Insufficient documentation

## 2014-01-15 DIAGNOSIS — I10 Essential (primary) hypertension: Secondary | ICD-10-CM | POA: Diagnosis not present

## 2014-01-15 DIAGNOSIS — Z9889 Other specified postprocedural states: Secondary | ICD-10-CM | POA: Insufficient documentation

## 2014-01-15 DIAGNOSIS — Z7982 Long term (current) use of aspirin: Secondary | ICD-10-CM | POA: Insufficient documentation

## 2014-01-15 DIAGNOSIS — Z8673 Personal history of transient ischemic attack (TIA), and cerebral infarction without residual deficits: Secondary | ICD-10-CM | POA: Insufficient documentation

## 2014-01-15 DIAGNOSIS — Z7901 Long term (current) use of anticoagulants: Secondary | ICD-10-CM | POA: Diagnosis not present

## 2014-01-15 DIAGNOSIS — Z79899 Other long term (current) drug therapy: Secondary | ICD-10-CM | POA: Diagnosis not present

## 2014-01-15 DIAGNOSIS — R531 Weakness: Secondary | ICD-10-CM | POA: Diagnosis present

## 2014-01-15 DIAGNOSIS — Z8781 Personal history of (healed) traumatic fracture: Secondary | ICD-10-CM | POA: Diagnosis not present

## 2014-01-15 DIAGNOSIS — K219 Gastro-esophageal reflux disease without esophagitis: Secondary | ICD-10-CM | POA: Diagnosis not present

## 2014-01-15 DIAGNOSIS — J40 Bronchitis, not specified as acute or chronic: Secondary | ICD-10-CM

## 2014-01-15 LAB — URINALYSIS, ROUTINE W REFLEX MICROSCOPIC
Bilirubin Urine: NEGATIVE
Glucose, UA: NEGATIVE mg/dL
HGB URINE DIPSTICK: NEGATIVE
KETONES UR: NEGATIVE mg/dL
Leukocytes, UA: NEGATIVE
Nitrite: NEGATIVE
PROTEIN: NEGATIVE mg/dL
Specific Gravity, Urine: 1.01 (ref 1.005–1.030)
Urobilinogen, UA: 0.2 mg/dL (ref 0.0–1.0)
pH: 7.5 (ref 5.0–8.0)

## 2014-01-15 LAB — COMPREHENSIVE METABOLIC PANEL
ALT: 21 U/L (ref 0–35)
AST: 24 U/L (ref 0–37)
Albumin: 3.8 g/dL (ref 3.5–5.2)
Alkaline Phosphatase: 113 U/L (ref 39–117)
Anion gap: 15 (ref 5–15)
BILIRUBIN TOTAL: 0.7 mg/dL (ref 0.3–1.2)
BUN: 12 mg/dL (ref 6–23)
CO2: 26 mEq/L (ref 19–32)
CREATININE: 0.78 mg/dL (ref 0.50–1.10)
Calcium: 9.8 mg/dL (ref 8.4–10.5)
Chloride: 93 mEq/L — ABNORMAL LOW (ref 96–112)
GFR calc Af Amer: 83 mL/min — ABNORMAL LOW (ref >90)
GFR calc non Af Amer: 72 mL/min — ABNORMAL LOW (ref >90)
Glucose, Bld: 144 mg/dL — ABNORMAL HIGH (ref 70–99)
Potassium: 3.6 mEq/L — ABNORMAL LOW (ref 3.7–5.3)
Sodium: 134 mEq/L — ABNORMAL LOW (ref 137–147)
Total Protein: 7.8 g/dL (ref 6.0–8.3)

## 2014-01-15 LAB — CBC WITH DIFFERENTIAL/PLATELET
Basophils Absolute: 0 10*3/uL (ref 0.0–0.1)
Basophils Relative: 1 % (ref 0–1)
EOS PCT: 2 % (ref 0–5)
Eosinophils Absolute: 0.2 10*3/uL (ref 0.0–0.7)
HEMATOCRIT: 43.8 % (ref 36.0–46.0)
HEMOGLOBIN: 15.3 g/dL — AB (ref 12.0–15.0)
LYMPHS ABS: 1.2 10*3/uL (ref 0.7–4.0)
Lymphocytes Relative: 13 % (ref 12–46)
MCH: 30.7 pg (ref 26.0–34.0)
MCHC: 34.9 g/dL (ref 30.0–36.0)
MCV: 88 fL (ref 78.0–100.0)
MONOS PCT: 12 % (ref 3–12)
Monocytes Absolute: 1 10*3/uL (ref 0.1–1.0)
Neutro Abs: 6.3 10*3/uL (ref 1.7–7.7)
Neutrophils Relative %: 72 % (ref 43–77)
Platelets: 316 10*3/uL (ref 150–400)
RBC: 4.98 MIL/uL (ref 3.87–5.11)
RDW: 13.7 % (ref 11.5–15.5)
WBC: 8.7 10*3/uL (ref 4.0–10.5)

## 2014-01-15 LAB — TROPONIN I

## 2014-01-15 LAB — PROTIME-INR
INR: 2.21 — ABNORMAL HIGH (ref 0.00–1.49)
Prothrombin Time: 24.5 seconds — ABNORMAL HIGH (ref 11.6–15.2)

## 2014-01-15 MED ORDER — AZITHROMYCIN 250 MG PO TABS: ORAL_TABLET | ORAL | Status: DC

## 2014-01-15 MED ORDER — AMOXICILLIN 500 MG PO CAPS: 500.0000 mg | ORAL_CAPSULE | Freq: Three times a day (TID) | ORAL | Status: DC

## 2014-01-15 MED ORDER — SODIUM CHLORIDE 0.9 % IV BOLUS (SEPSIS)
500.0000 mL | Freq: Once | INTRAVENOUS | Status: AC
  Administered 2014-01-15: 500 mL via INTRAVENOUS

## 2014-01-15 NOTE — ED Provider Notes (Signed)
CSN: [id number]     Arrival date & time 01/15/14  7829 History  This chart was scribed for Karina Diego, MD by Zola Button, ED Scribe. This patient was seen in room APA02/APA02 and the patient's care was started at 9:23 AM.      Chief Complaint  Patient presents with  . Weakness     Patient is a 80 y.o. female presenting with cough. The history is provided by the patient. No language interpreter was used.  Cough Cough characteristics:  Productive Onset quality:  Gradual Timing:  Intermittent Progression:  Unchanged Chronicity:  New Relieved by:  None tried Worsened by:  Nothing tried Ineffective treatments:  None tried Associated symptoms: chills   Associated symptoms: no eye discharge and no rash    HPI Comments: Karina [name] is a 80 y.o. female with a hx of GERD, HTN and peripheral neuropathy who presents to the Emergency Department complaining of gradual onset, intermittent cough productive of mucus that began PTA. Patient also has generalized weakness that began this morning, congestion and difficulty sleeping last night. She notes being stressed out and exhausted lately, and she felt weak with tremors this morning. Also, she notes that her legs were weaker than baseline. Patient denies pain or discomfort, fever and chills. She saw Dr. Manuella Ghazi, her PCP, yesterday. Patient states she has not been eating and drinking much the past few days. She currently has a pacemaker.  Past Medical History  Diagnosis Date  . Paroxysmal atrial fibrillation   . TIA (transient ischemic attack) 08/2011    OSH: flushing, left LE numbness, CT negative  . CAD (coronary artery disease)     EF 55% by 2 D Echo 2008 and 2012  . Peripheral neuropathy   . Peripheral vascular disease   . Fibromuscular dysplasia     S/P PCI right renal aretery 1999  . Hx of echocardiogram 05/2012     EF >55%, LA 3.9 cm, mild LVH  . LVH (left ventricular hypertrophy)     Mild  . Hx of cardiovascular stress test  09/2010    Nuclear stress testing showing no evidence of ischemia, EF=76%, No EKG changes & no perfusion defects.  . Chronic atrial fibrillation   . Pre-diabetes 01/21/11    Chronic  . Renovascular hypertension   . Hyperlipidemia   . Tic [place] 1994    S/P successful surgery  . Osteoporosis     With Hx of thoracic compression fractures  . Thoracic compression fracture   . DDD (degenerative disc disease)   . Osteoarthritis   . Endometrial carcinoma   . 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 Chronic fatigue   . Actinic keratosis   . Hip fracture, right 08/06/06    S/P surgery  . Chronic edema 10/15/11  . GERD (gastroesophageal reflux disease)   . Tachy-brady syndrome   . Bilateral leg weakness 09/27/12  . Encounter for long-term (current) use of other medications 09/02/12  . Hypertension     Difficult to control   Past Surgical History  Procedure Laterality Date  . Cardiac catheterization  11/05/01    EF 72%. RCA 25%. LCX 50%. Ramus 75/100%. LAD 95%. D2 75%.  . Cholecystectomy  1989  . Vaginal hysterectomy  1990  . Appendectomy  1946  . Lumbar disc surgery    . Renal artery angioplasty Right 1994    Renal artery stenosis secondary to fibromusclar dysplasia  . Renal artery angioplasty Right 09/13/97  PTA/Stent to ostial right renal artery, No distal fibromuscular hyperplasia  . Renal angiogram  09/14/97    25% diffuse left renal artery. 50% ostia; right renal artery.  . Abdominal aortagram  09/05/01    Right & left renals 25%  . Dipyridamole stress test  09/2010    Nuclear stress testing showing no evidence of ischemia, EF=76%, No EKG changes & no perfusion defects.  . Cesarean section  1959  . Cesarean section  1961  . Spinal fusion  2002    Percutaneous spinal fusion  . Coronary angioplasty    . Total abdominal hysterectomy w/ bilateral salpingoophorectomy  1990  . Coronary angioplasty with stent placement  11/05/01    PTCA/Stent to 95% mid  LAD, Ramus lesion could not be crossed and was not treated.    Family History  Problem Relation Age of Onset  . COPD Brother   . Diabetes Brother     DM  . Hypertension Brother   . Stroke Father   . Stroke Other    History  Substance Use Topics  . Smoking status: Never Smoker   . Smokeless tobacco: Never Used  . Alcohol Use: Yes     Comment: Rarely   OB History   Grav Para Term Preterm Abortions TAB SAB Ect Mult Living                 Review of Systems  Constitutional: Positive for chills.  HENT: Positive for congestion. Negative for ear discharge and sinus pressure.   Eyes: Negative for discharge.  Respiratory: Positive for cough.   Gastrointestinal: Negative for diarrhea.  Genitourinary: Negative for frequency and hematuria.  Musculoskeletal: Negative for back pain.  Skin: Negative for rash.  Neurological: Positive for tremors.  Psychiatric/Behavioral: Positive for sleep disturbance. Negative for hallucinations.      Allergies  Amiodarone; Ace inhibitors; Meperidine and related; Prednisone; and Tikosyn  Home Medications   Prior to Admission medications   Medication Sig Start Date End Date Taking? Authorizing Provider  aspirin EC 81 MG tablet Take 81 mg by mouth daily.   Yes Historical Provider, MD  calcium carbonate (TUMS) 500 MG chewable tablet Chew 3 tablets by mouth daily.   Yes Historical Provider, MD  cloNIDine (CATAPRES) 0.1 MG tablet Take 1 tablet in the morning and 2 tablets at night 05/07/09  Yes Historical Provider, MD  diltiazem (CARDIZEM CD) 120 MG 24 hr capsule Take 1 capsule by mouth daily.  08/22/13 08/22/14 Yes Historical Provider, MD  Ergocalciferol (VITAMIN D2) 2000 UNITS TABS Take 1 tablet by mouth daily. Pt states that she takes D2 & D3 interchangeably   Yes Historical Provider, MD  furosemide (LASIX) 20 MG tablet Take 20 mg by mouth as needed.  06/13/13  Yes Historical Provider, MD  metoprolol tartrate (LOPRESSOR) 25 MG tablet Take 1 tablet by  mouth 2 (two) times daily.   Yes Historical Provider, MD  nitroGLYCERIN (NITROSTAT) 0.4 MG SL tablet Place 0.4 mg under the tongue as directed. 04/10/10  Yes Historical Provider, MD  pantoprazole (PROTONIX) 40 MG tablet Take 40 mg by mouth as needed.    Yes Historical Provider, MD  Polyethyl Glycol-Propyl Glycol (SYSTANE) 0.4-0.3 % SOLN Apply 1 drop to eye 4 (four) times daily.   Yes Historical Provider, MD  potassium chloride (K-DUR) 10 MEQ tablet Take 10 mEq by mouth as needed.    Yes Historical Provider, MD  rosuvastatin (CRESTOR) 5 MG tablet Take 5 mg by mouth daily. 03/07/13 03/07/14 Yes Historical  Provider, MD  valsartan (DIOVAN) 320 MG tablet Take 320 mg by mouth daily. 04/10/10  Yes Historical Provider, MD  warfarin (COUMADIN) 2 MG tablet Take 2-4 mg by mouth See admin instructions. Takes 2 pills (4 mg) on Mondays and Fridays. Take 1 pill (2 mg) all other days.   Yes Historical Provider, MD   BP 168/100  Pulse 84  Temp(Src) 97.8 F (36.6 C) (Oral)  Resp 18  Ht 5\' 5"  (1.651 m)  Wt 120 lb (54.432 kg)  BMI 19.97 kg/m2  SpO2 96% Physical Exam  Nursing note and vitals reviewed. Constitutional: She is oriented to person, place, and time. She appears well-developed.  HENT:  Head: Normocephalic.  Eyes: Conjunctivae and EOM are normal. No scleral icterus.  Neck: Neck supple. No thyromegaly present.  Cardiovascular: Normal rate and regular rhythm.  Exam reveals no gallop and no friction rub.   No murmur heard. Pulmonary/Chest: No stridor. She has no wheezes. She has no rales. She exhibits no tenderness.  Abdominal: She exhibits no distension. There is no tenderness. There is no rebound.  Musculoskeletal: Normal range of motion. She exhibits no edema.  Lymphadenopathy:    She has no cervical adenopathy.  Neurological: She is oriented to person, place, and time. She exhibits normal muscle tone. Coordination normal.  Skin: No rash noted. No erythema.  Psychiatric: She has a normal mood and  affect. Her behavior is normal.    ED Course  Procedures  DIAGNOSTIC STUDIES: Oxygen Saturation is 96% on RA, adequate by my interpretation.    COORDINATION OF CARE: 9:28 AM-Discussed treatment plan which includes labs and imaging with pt at bedside and pt agreed to plan.   Labs Review Labs Reviewed  PROTIME-INR - Abnormal; Notable for the following:    Prothrombin Time 24.5 (*)    INR 2.21 (*)    All other components within normal limits  CBC WITH DIFFERENTIAL - Abnormal; Notable for the following:    Hemoglobin 15.3 (*)    All other components within normal limits  TROPONIN I  COMPREHENSIVE METABOLIC PANEL  URINALYSIS, ROUTINE W REFLEX MICROSCOPIC    Imaging Review No results found.   EKG Interpretation None      MDM   Final diagnoses:  None    Bronchitis,  tx with amox  The chart was scribed for me under my direct supervision.  I personally performed the history, physical, and medical decision making and all procedures in the evaluation of this patient.Karina Diego, MD 01/15/14 [phone]

## 2014-01-15 NOTE — Discharge Instructions (Signed)
Follow up with your md this week for recheck  °

## 2014-01-15 NOTE — ED Notes (Signed)
Pt reports generalized weakness since last night and blurred vision since this am. Pt alert and oriented. Airway patent. No facial asymmetry.speech clear. Equal grips.

## 2014-01-29 ENCOUNTER — Encounter (HOSPITAL_COMMUNITY): Payer: Self-pay | Admitting: Emergency Medicine

## 2014-01-29 ENCOUNTER — Inpatient Hospital Stay (HOSPITAL_COMMUNITY)
Admission: EM | Admit: 2014-01-29 | Discharge: 2014-01-30 | DRG: 641 | Disposition: A | Discharge status: Home / Self Care | Payer: Medicare Other | Attending: Internal Medicine | Admitting: Internal Medicine

## 2014-01-29 ENCOUNTER — Emergency Department (HOSPITAL_COMMUNITY): Payer: Medicare Other

## 2014-01-29 DIAGNOSIS — Z825 Family history of asthma and other chronic lower respiratory diseases: Secondary | ICD-10-CM | POA: Diagnosis not present

## 2014-01-29 DIAGNOSIS — M810 Age-related osteoporosis without current pathological fracture: Secondary | ICD-10-CM | POA: Diagnosis present

## 2014-01-29 DIAGNOSIS — Z8673 Personal history of transient ischemic attack (TIA), and cerebral infarction without residual deficits: Secondary | ICD-10-CM | POA: Diagnosis not present

## 2014-01-29 DIAGNOSIS — I482 Chronic atrial fibrillation, unspecified: Secondary | ICD-10-CM

## 2014-01-29 DIAGNOSIS — E871 Hypo-osmolality and hyponatremia: Principal | ICD-10-CM | POA: Diagnosis present

## 2014-01-29 DIAGNOSIS — Z950 Presence of cardiac pacemaker: Secondary | ICD-10-CM | POA: Diagnosis not present

## 2014-01-29 DIAGNOSIS — Z981 Arthrodesis status: Secondary | ICD-10-CM

## 2014-01-29 DIAGNOSIS — Z833 Family history of diabetes mellitus: Secondary | ICD-10-CM

## 2014-01-29 DIAGNOSIS — K219 Gastro-esophageal reflux disease without esophagitis: Secondary | ICD-10-CM | POA: Diagnosis present

## 2014-01-29 DIAGNOSIS — E785 Hyperlipidemia, unspecified: Secondary | ICD-10-CM | POA: Diagnosis present

## 2014-01-29 DIAGNOSIS — I739 Peripheral vascular disease, unspecified: Secondary | ICD-10-CM | POA: Diagnosis present

## 2014-01-29 DIAGNOSIS — I10 Essential (primary) hypertension: Secondary | ICD-10-CM | POA: Diagnosis present

## 2014-01-29 DIAGNOSIS — R531 Weakness: Secondary | ICD-10-CM

## 2014-01-29 DIAGNOSIS — M1990 Unspecified osteoarthritis, unspecified site: Secondary | ICD-10-CM | POA: Diagnosis present

## 2014-01-29 DIAGNOSIS — Z823 Family history of stroke: Secondary | ICD-10-CM

## 2014-01-29 DIAGNOSIS — Z90710 Acquired absence of both cervix and uterus: Secondary | ICD-10-CM

## 2014-01-29 DIAGNOSIS — G629 Polyneuropathy, unspecified: Secondary | ICD-10-CM | POA: Diagnosis present

## 2014-01-29 DIAGNOSIS — Z7982 Long term (current) use of aspirin: Secondary | ICD-10-CM | POA: Diagnosis not present

## 2014-01-29 DIAGNOSIS — Z8249 Family history of ischemic heart disease and other diseases of the circulatory system: Secondary | ICD-10-CM

## 2014-01-29 DIAGNOSIS — I4891 Unspecified atrial fibrillation: Secondary | ICD-10-CM | POA: Diagnosis present

## 2014-01-29 DIAGNOSIS — I2510 Atherosclerotic heart disease of native coronary artery without angina pectoris: Secondary | ICD-10-CM | POA: Diagnosis present

## 2014-01-29 DIAGNOSIS — Z7901 Long term (current) use of anticoagulants: Secondary | ICD-10-CM

## 2014-01-29 DIAGNOSIS — Z79899 Other long term (current) drug therapy: Secondary | ICD-10-CM

## 2014-01-29 DIAGNOSIS — I480 Paroxysmal atrial fibrillation: Secondary | ICD-10-CM | POA: Diagnosis present

## 2014-01-29 HISTORY — DX: Presence of cardiac pacemaker: Z95.0

## 2014-01-29 LAB — COMPREHENSIVE METABOLIC PANEL
ALBUMIN: 3.4 g/dL — AB (ref 3.5–5.2)
ALT: 20 U/L (ref 0–35)
ANION GAP: 11 (ref 5–15)
AST: 25 U/L (ref 0–37)
Alkaline Phosphatase: 104 U/L (ref 39–117)
BUN: 9 mg/dL (ref 6–23)
CALCIUM: 9.9 mg/dL (ref 8.4–10.5)
CO2: 27 meq/L (ref 19–32)
CREATININE: 0.79 mg/dL (ref 0.50–1.10)
Chloride: 87 mEq/L — ABNORMAL LOW (ref 96–112)
GFR calc Af Amer: 83 mL/min — ABNORMAL LOW (ref >90)
GFR, EST NON AFRICAN AMERICAN: 72 mL/min — AB (ref >90)
Glucose, Bld: 118 mg/dL — ABNORMAL HIGH (ref 70–99)
Potassium: 4.5 mEq/L (ref 3.7–5.3)
Sodium: 125 mEq/L — ABNORMAL LOW (ref 137–147)
Total Bilirubin: 0.6 mg/dL (ref 0.3–1.2)
Total Protein: 7.1 g/dL (ref 6.0–8.3)

## 2014-01-29 LAB — URINALYSIS, ROUTINE W REFLEX MICROSCOPIC
Bilirubin Urine: NEGATIVE
Glucose, UA: NEGATIVE mg/dL
Hgb urine dipstick: NEGATIVE
Ketones, ur: NEGATIVE mg/dL
Leukocytes, UA: NEGATIVE
Nitrite: NEGATIVE
Protein, ur: NEGATIVE mg/dL
Specific Gravity, Urine: 1.01 (ref 1.005–1.030)
UROBILINOGEN UA: 0.2 mg/dL (ref 0.0–1.0)
pH: 7.5 (ref 5.0–8.0)

## 2014-01-29 LAB — CBC
HCT: 39.6 % (ref 36.0–46.0)
Hemoglobin: 13.9 g/dL (ref 12.0–15.0)
MCH: 30.8 pg (ref 26.0–34.0)
MCHC: 35.1 g/dL (ref 30.0–36.0)
MCV: 87.6 fL (ref 78.0–100.0)
Platelets: 302 10*3/uL (ref 150–400)
RBC: 4.52 MIL/uL (ref 3.87–5.11)
RDW: 13.3 % (ref 11.5–15.5)
WBC: 9 10*3/uL (ref 4.0–10.5)

## 2014-01-29 LAB — NA AND K (SODIUM & POTASSIUM), RAND UR
POTASSIUM UR: 23 meq/L
Sodium, Ur: 68 mEq/L

## 2014-01-29 LAB — TROPONIN I: Troponin I: <0.3 ng/mL (ref <0.30)

## 2014-01-29 LAB — PROTIME-INR
INR: 2.15 — AB (ref 0.00–1.49)
PROTHROMBIN TIME: 24.2 s — AB (ref 11.6–15.2)

## 2014-01-29 MED ORDER — SODIUM CHLORIDE 0.9 % IV SOLN
INTRAVENOUS | Status: DC
  Administered 2014-01-29: 18:00:00 via INTRAVENOUS

## 2014-01-29 MED ORDER — ONDANSETRON HCL 4 MG/2ML IJ SOLN: 4.0000 mg | Freq: Four times a day (QID) | INTRAMUSCULAR | Status: DC | PRN

## 2014-01-29 MED ORDER — METOPROLOL TARTRATE 25 MG PO TABS
25.0000 mg | ORAL_TABLET | Freq: Two times a day (BID) | ORAL | Status: DC
  Administered 2014-01-29 – 2014-01-30 (×2): 25 mg via ORAL
  Filled 2014-01-29 (×2): qty 1

## 2014-01-29 MED ORDER — CLONIDINE HCL 0.1 MG PO TABS
0.1000 mg | ORAL_TABLET | Freq: Two times a day (BID) | ORAL | Status: DC
Start: 2014-01-29 — End: 2014-01-30
  Administered 2014-01-29 – 2014-01-30 (×2): 0.1 mg via ORAL
  Filled 2014-01-29 (×2): qty 1

## 2014-01-29 MED ORDER — IRBESARTAN 75 MG PO TABS
75.0000 mg | ORAL_TABLET | Freq: Every day | ORAL | Status: DC
  Administered 2014-01-30: 75 mg via ORAL
  Filled 2014-01-29: qty 1

## 2014-01-29 MED ORDER — OXYCODONE HCL 5 MG PO TABS: 5.0000 mg | ORAL_TABLET | ORAL | Status: DC | PRN

## 2014-01-29 MED ORDER — WARFARIN - PHARMACIST DOSING INPATIENT: Status: DC

## 2014-01-29 MED ORDER — POLYVINYL ALCOHOL 1.4 % OP SOLN
1.0000 [drp] | Freq: Four times a day (QID) | OPHTHALMIC | Status: DC
  Administered 2014-01-29 – 2014-01-30 (×2): 1 [drp] via OPHTHALMIC
  Filled 2014-01-29: qty 15

## 2014-01-29 MED ORDER — WARFARIN SODIUM 2 MG PO TABS
2.0000 mg | ORAL_TABLET | Freq: Once | ORAL | Status: AC
  Administered 2014-01-29: 2 mg via ORAL
  Filled 2014-01-29: qty 1

## 2014-01-29 MED ORDER — DILTIAZEM HCL ER COATED BEADS 120 MG PO CP24
120.0000 mg | ORAL_CAPSULE | Freq: Two times a day (BID) | ORAL | Status: DC
  Administered 2014-01-29 – 2014-01-30 (×2): 120 mg via ORAL
  Filled 2014-01-29 (×2): qty 1

## 2014-01-29 MED ORDER — ASPIRIN EC 81 MG PO TBEC
81.0000 mg | DELAYED_RELEASE_TABLET | Freq: Every day | ORAL | Status: DC
Start: 2014-01-30 — End: 2014-01-30
  Administered 2014-01-30: 81 mg via ORAL
  Filled 2014-01-29: qty 1

## 2014-01-29 MED ORDER — SENNOSIDES-DOCUSATE SODIUM 8.6-50 MG PO TABS: 1.0000 | ORAL_TABLET | Freq: Every evening | ORAL | Status: DC | PRN

## 2014-01-29 MED ORDER — ROSUVASTATIN CALCIUM 10 MG PO TABS
5.0000 mg | ORAL_TABLET | Freq: Every day | ORAL | Status: DC
  Administered 2014-01-29: 5 mg via ORAL
  Filled 2014-01-29 (×2): qty 1

## 2014-01-29 MED ORDER — ACETAMINOPHEN 650 MG RE SUPP: 650.0000 mg | Freq: Four times a day (QID) | RECTAL | Status: DC | PRN

## 2014-01-29 MED ORDER — ACETAMINOPHEN 325 MG PO TABS: 650.0000 mg | ORAL_TABLET | Freq: Four times a day (QID) | ORAL | Status: DC | PRN

## 2014-01-29 MED ORDER — VITAMIN D 1000 UNITS PO TABS
2000.0000 [IU] | ORAL_TABLET | Freq: Every day | ORAL | Status: DC
  Administered 2014-01-30: 2000 [IU] via ORAL
  Filled 2014-01-29 (×5): qty 2

## 2014-01-29 MED ORDER — ONDANSETRON HCL 4 MG PO TABS: 4.0000 mg | ORAL_TABLET | Freq: Four times a day (QID) | ORAL | Status: DC | PRN

## 2014-01-29 NOTE — Progress Notes (Signed)
ANTICOAGULATION CONSULT NOTE - Initial Consult  Pharmacy Consult for Coumadin (chronic Rx PTA) Indication: atrial fibrillation  Allergies  Allergen Reactions  . Amiodarone Other (See Comments)    Worsening peripheral neuropathy  . Ace Inhibitors Hives  . Meperidine And Related   . Prednisone   . Tikosyn [Dofetilide] Other (See Comments)    QT prolongation    Patient Measurements: Height: 5' 1.5" (156.2 cm) Weight: 123 lb 7.3 oz (56 kg) IBW/kg (Calculated) : 48.95  Vital Signs: Temp: 97.7 F (36.5 C) (10/25 1506) Temp Source: Oral (10/25 1506) BP: 150/78 mmHg (10/25 1506) Pulse Rate: 80 (10/25 1506)  Labs:  Recent Labs  01/29/14 1033  HGB 13.9  HCT 39.6  PLT 302  LABPROT 24.2*  INR 2.15*  CREATININE 0.79  TROPONINI <0.30    Estimated Creatinine Clearance: 36.9 ml/min (by C-G formula based on Cr of 0.79).  Medical History: Past Medical History  Diagnosis Date  . Paroxysmal atrial fibrillation   . TIA (transient ischemic attack) 08/2011    OSH: flushing, left LE numbness, CT negative  . CAD (coronary artery disease)     EF 55% by 2 D Echo 2008 and 2012  . Peripheral neuropathy   . Peripheral vascular disease   . Fibromuscular dysplasia     S/P PCI right renal aretery 1999  . Hx of echocardiogram 05/2012     EF >55%, LA 3.9 cm, mild LVH  . LVH (left ventricular hypertrophy)     Mild  . Hx of cardiovascular stress test 09/2010    Nuclear stress testing showing no evidence of ischemia, EF=76%, No EKG changes & no perfusion defects.  . Chronic atrial fibrillation   . Pre-diabetes 01/21/11    Chronic  . Renovascular hypertension   . Hyperlipidemia   . Tic [place]ou[place] 1994    S/P successful surgery  . Osteoporosis     With Hx of thoracic compression fractures  . Thoracic compression fracture   . DDD (degenerative disc disease)   . Osteoarthritis   . Endometrial carcinoma   . 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 Chronic  fatigue   . Actinic keratosis   . Hip fracture, right 08/06/06    S/P surgery  . Chronic edema 10/15/11  . GERD (gastroesophageal reflux disease)   . Tachy-brady syndrome   . Bilateral leg weakness 09/27/12  . Encounter for long-term (current) use of other medications 09/02/12  . Hypertension     Difficult to control  . Pacemaker     Medications:  Prescriptions prior to admission  Medication Sig Dispense Refill  . aspirin EC 81 MG tablet Take 81 mg by mouth daily.      . calcium carbonate (TUMS) 500 MG chewable tablet Chew 3 tablets by mouth daily.      . cloNIDine (CATAPRES) 0.1 MG tablet Take 1 tablet in the morning and 2 tablets at night      . diltiazem (CARDIZEM CD) 120 MG 24 hr capsule Take 1 capsule by mouth 2 (two) times daily.       . Ergocalciferol (VITAMIN D2) 2000 UNITS TABS Take 1 tablet by mouth daily. Pt states that she takes D2 & D3 interchangeably      . metoprolol tartrate (LOPRESSOR) 25 MG tablet Take 1 tablet by mouth 2 (two) times daily.      Vladimir Faster Glycol-Propyl Glycol (SYSTANE) 0.4-0.3 % SOLN Apply 1 drop to eye 4 (four) times daily.      Marland Kitchen  rosuvastatin (CRESTOR) 5 MG tablet Take 5 mg by mouth daily.      . valsartan (DIOVAN) 320 MG tablet Take 320 mg by mouth daily.      Marland Kitchen warfarin (COUMADIN) 2 MG tablet Take 2-4 mg by mouth See admin instructions. Takes 2 pills (4 mg) on Mondays and Fridays. Take 1 pill (2 mg) all other days.      . furosemide (LASIX) 20 MG tablet Take 20 mg by mouth as needed for fluid.       . nitroGLYCERIN (NITROSTAT) 0.4 MG SL tablet Place 0.4 mg under the tongue every 5 (five) minutes as needed for chest pain.       . pantoprazole (PROTONIX) 40 MG tablet Take 40 mg by mouth as needed (acid reflux).       . potassium chloride (K-DUR) 10 MEQ tablet Take 10 mEq by mouth as needed (take with lasix).         Assessment: 80yo female on chronic Coumadin PTA for h/o afib.  INR therapeutic on admission.  Home dose listed above.    Goal of  Therapy:  INR 2-3 Monitor platelets by anticoagulation protocol: Yes   Plan:  Coumadin 2mg  po today x 1 (home dose) INR daily  [place] Crane, Wille Aubuchon A 01/29/2014,4:58 PM

## 2014-01-29 NOTE — ED Notes (Signed)
Pt. C/o of weakness and SOB for x 4 days.  Pt. Has hx of hypertension.  Pt. States SOB with movement and "verge of dizziness".

## 2014-01-29 NOTE — ED Provider Notes (Signed)
CSN: [id number]     Arrival date & time 01/29/14  3500 History  This chart was scribed for Dorie Rank, MD by Hilda Lias, ED Scribe. This patient was seen in room APA08/APA08 and the patient's care was started at 10:01 AM.    Chief Complaint  Patient presents with  . Hypertension   The history is provided by the patient. No language interpreter was used.    HPI Comments: Karina [name] is a 80 y.o. female who presents to the Emergency Department complaining of constant high blood pressure that began 4-5 days ago. Pt notes that she feels fatigued, has decreased visual acuity, generalized weakness, and has trouble ambulating. Pt came into ED two weeks ago for the similar symptoms. Pt reports that after coming into ED two weeks prior, her symptoms improved until 4-5 days ago. Pt notes that her appetite has been poor since the onset of the episode. Pt notes having loose bowels but denies diarrhea. Pt denies unilateral weakness, slurred speech, dysuria, sore throat, and vomiting.   Past Medical History  Diagnosis Date  . Paroxysmal atrial fibrillation   . TIA (transient ischemic attack) 08/2011    OSH: flushing, left LE numbness, CT negative  . CAD (coronary artery disease)     EF 55% by 2 D Echo 2008 and 2012  . Peripheral neuropathy   . Peripheral vascular disease   . Fibromuscular dysplasia     S/P PCI right renal aretery 1999  . Hx of echocardiogram 05/2012     EF >55%, LA 3.9 cm, mild LVH  . LVH (left ventricular hypertrophy)     Mild  . Hx of cardiovascular stress test 09/2010    Nuclear stress testing showing no evidence of ischemia, EF=76%, No EKG changes & no perfusion defects.  . Chronic atrial fibrillation   . Pre-diabetes 01/21/11    Chronic  . Renovascular hypertension   . Hyperlipidemia   . Tic douloureux 1994    S/P successful surgery  . Osteoporosis     With Hx of thoracic compression fractures  . Thoracic compression fracture   . DDD (degenerative disc disease)    . Osteoarthritis   . Endometrial carcinoma   . 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 Chronic fatigue   . Actinic keratosis   . Hip fracture, right 08/06/06    S/P surgery  . Chronic edema 10/15/11  . GERD (gastroesophageal reflux disease)   . Tachy-brady syndrome   . Bilateral leg weakness 09/27/12  . Encounter for long-term (current) use of other medications 09/02/12  . Hypertension     Difficult to control   Past Surgical History  Procedure Laterality Date  . Cardiac catheterization  11/05/01    EF 72%. RCA 25%. LCX 50%. Ramus 75/100%. LAD 95%. D2 75%.  . Cholecystectomy  1989  . Vaginal hysterectomy  1990  . Appendectomy  1946  . Lumbar disc surgery    . Renal artery angioplasty Right 1994    Renal artery stenosis secondary to fibromusclar dysplasia  . Renal artery angioplasty Right 09/13/97    PTA/Stent to ostial right renal artery, No distal fibromuscular hyperplasia  . Renal angiogram  09/14/97    25% diffuse left renal artery. 50% ostia; right renal artery.  . Abdominal aortagram  09/05/01    Right & left renals 25%  . Dipyridamole stress test  09/2010    Nuclear stress testing showing no evidence of ischemia, EF=76%, No EKG changes &  no perfusion defects.  . Cesarean section  1959  . Cesarean section  1961  . Spinal fusion  2002    Percutaneous spinal fusion  . Coronary angioplasty    . Total abdominal hysterectomy w/ bilateral salpingoophorectomy  1990  . Coronary angioplasty with stent placement  11/05/01    PTCA/Stent to 95% mid LAD, Ramus lesion could not be crossed and was not treated.    Family History  Problem Relation Age of Onset  . COPD Brother   . Diabetes Brother     DM  . Hypertension Brother   . Stroke Father   . Stroke Other    History  Substance Use Topics  . Smoking status: Never Smoker   . Smokeless tobacco: Never Used  . Alcohol Use: Yes     Comment: Rarely   OB History   Grav Para Term Preterm Abortions TAB  SAB Ect Mult Living   2 2 2       2      Review of Systems  All other systems reviewed and are negative.   A complete 10 system review of systems was obtained and all systems are negative except as noted in the HPI and PMH.    Allergies  Amiodarone; Ace inhibitors; Meperidine and related; Prednisone; and Tikosyn  Home Medications   Prior to Admission medications   Medication Sig Start Date End Date Taking? Authorizing Provider  aspirin EC 81 MG tablet Take 81 mg by mouth daily.   Yes Historical Provider, MD  calcium carbonate (TUMS) 500 MG chewable tablet Chew 3 tablets by mouth daily.   Yes Historical Provider, MD  cloNIDine (CATAPRES) 0.1 MG tablet Take 1 tablet in the morning and 2 tablets at night 05/07/09  Yes Historical Provider, MD  diltiazem (CARDIZEM CD) 120 MG 24 hr capsule Take 1 capsule by mouth 2 (two) times daily.  08/22/13 08/22/14 Yes Historical Provider, MD  Ergocalciferol (VITAMIN D2) 2000 UNITS TABS Take 1 tablet by mouth daily. Pt states that she takes D2 & D3 interchangeably   Yes Historical Provider, MD  metoprolol tartrate (LOPRESSOR) 25 MG tablet Take 1 tablet by mouth 2 (two) times daily.   Yes Historical Provider, MD  Polyethyl Glycol-Propyl Glycol (SYSTANE) 0.4-0.3 % SOLN Apply 1 drop to eye 4 (four) times daily.   Yes Historical Provider, MD  rosuvastatin (CRESTOR) 5 MG tablet Take 5 mg by mouth daily. 03/07/13 03/07/14 Yes Historical Provider, MD  valsartan (DIOVAN) 320 MG tablet Take 320 mg by mouth daily. 04/10/10  Yes Historical Provider, MD  warfarin (COUMADIN) 2 MG tablet Take 2-4 mg by mouth See admin instructions. Takes 2 pills (4 mg) on Mondays and Fridays. Take 1 pill (2 mg) all other days.   Yes Historical Provider, MD  furosemide (LASIX) 20 MG tablet Take 20 mg by mouth as needed for fluid.  06/13/13   Historical Provider, MD  nitroGLYCERIN (NITROSTAT) 0.4 MG SL tablet Place 0.4 mg under the tongue every 5 (five) minutes as needed for chest pain.  04/10/10    Historical Provider, MD  pantoprazole (PROTONIX) 40 MG tablet Take 40 mg by mouth as needed (acid reflux).     Historical Provider, MD  potassium chloride (K-DUR) 10 MEQ tablet Take 10 mEq by mouth as needed (take with lasix).     Historical Provider, MD   BP 139/95  Pulse 84  Temp(Src) 99 F (37.2 C) (Oral)  Resp 20  Ht 5' 1.5" (1.562 m)  Wt 121 lb (  54.885 kg)  BMI 22.50 kg/m2  SpO2 96% Physical Exam  Nursing note and vitals reviewed. Constitutional: She appears well-developed and well-nourished. No distress.  HENT:  Head: Normocephalic and atraumatic.  Right Ear: External ear normal.  Left Ear: External ear normal.  Eyes: Conjunctivae are normal. Right eye exhibits no discharge. Left eye exhibits no discharge. No scleral icterus.  Neck: Neck supple. No tracheal deviation present.  Cardiovascular: Normal rate, regular rhythm and intact distal pulses.   Pulmonary/Chest: Effort normal and breath sounds normal. No stridor. No respiratory distress. She has no wheezes. She has no rales.  Abdominal: Soft. Bowel sounds are normal. She exhibits no distension. There is no tenderness. There is no rebound and no guarding.  Musculoskeletal: She exhibits no edema and no tenderness.  Foot drop splints bilateral lower extremities  Neurological: She is alert. She has normal strength. No cranial nerve deficit (no facial droop, extraocular movements intact, no slurred speech) or sensory deficit. She exhibits normal muscle tone. She displays no seizure activity. Coordination normal.  Equal grip strength bilaterally Able to lift both legs off bed without drift   Skin: Skin is warm and dry. No rash noted.  Psychiatric: She has a normal mood and affect.    ED Course  Procedures (including critical care time)  DIAGNOSTIC STUDIES: Oxygen Saturation is 96% on room air, normal by my interpretation.    COORDINATION OF CARE: 10:05 AM Discussed treatment plan with pt at bedside and pt agreed to  plan.   Labs Review Labs Reviewed  COMPREHENSIVE METABOLIC PANEL - Abnormal; Notable for the following:    Sodium 125 (*)    Chloride 87 (*)    Glucose, Bld 118 (*)    Albumin 3.4 (*)    GFR calc non Af Amer 72 (*)    GFR calc Af Amer 83 (*)    All other components within normal limits  PROTIME-INR - Abnormal; Notable for the following:    Prothrombin Time 24.2 (*)    INR 2.15 (*)    All other components within normal limits  CBC  TROPONIN I  URINALYSIS, ROUTINE W REFLEX MICROSCOPIC    Imaging Review Dg Chest 2 View  01/29/2014   CLINICAL DATA:  Weakness, cough, history coronary artery disease, LEFT ventricular hypertrophy, hyperlipidemia, GERD, endometrial cancer, hypertension  EXAM: CHEST  2 VIEW  COMPARISON:  01/15/2014  FINDINGS: LEFT subclavian transvenous pacemaker lead projects at RIGHT ventricle.  Upper normal heart size.  Calcified tortuous aorta.  Mediastinal contours and pulmonary vascularity normal.  Chronic basilar interstitial changes and minimal central peribronchial thickening.  No definite acute infiltrate, pleural effusion or pneumothorax.  Bones demineralized.  Prior lumbar fusion.  IMPRESSION: Chronic basilar interstitial disease and chronic bronchitic changes.  No acute abnormalities.   Electronically Signed   By: Lavonia Dana M.D.   On: 01/29/2014 11:22     EKG Interpretation   Date/Time:  Sunday January 29 2014 10:27:09 EDT Ventricular Rate:  76 PR Interval:    QRS Duration: 139 QT Interval:  439 QTC Calculation: 494 R Axis:   116 Text Interpretation:  sinus rhythm with 1st degree A-V block Nonspecific  intraventricular conduction delay Lateral infarct, age indeterminate  Probable anteroseptal infarct, old Confirmed by Lakysha Kossman  MD-J, Auriel Kist ([id number])  on 01/29/2014 10:45:40 AM      MDM   Final diagnoses:  Hyponatremia   The patient's sodium level has dropped from 134 to 125 over the last 2 weeks. She does have a history  of SIADH presumably related to  Tegretol however she has not been on Tegretol recently. Patient also has furosemide listed under her medications but she has not been taking that recently. Because of the significant decline in her sodium level and her worsening symptoms I will consult with the medical service regarding possible admission and further treatment.  I personally performed the services described in this documentation, which was scribed in my presence.  The recorded information has been reviewed and is accurate.    Dorie Rank, MD 01/29/14 [phone]

## 2014-01-29 NOTE — H&P (Signed)
[facility]          History and Physical    PCP:   [name], MD   Chief Complaint:  Weakness, elevated blood pressure  HPI: Patient is a pleasant 80 year old woman with past medical history significant for atrial fibrillation status post pacemaker, hypertension, hyperlipidemia who presents to the hospital today with the above-mentioned complaints. She states that she has noted that for the past 2-3 days her blood pressure has been elevated in the range of 160/100. She is also noticed increased generalized weakness to the point she feels very fatigued and is having difficulty getting out of bed. 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 In the ED she was found to have a sodium level of 125 and is referred to [place] for admission for further evaluation and management.  Allergies:   Allergies  Allergen Reactions  . Amiodarone Other (See Comments)    Worsening peripheral neuropathy  . Ace Inhibitors Hives  . Meperidine And Related   . Prednisone   . Tikosyn [Dofetilide] Other (See Comments)    QT prolongation      Past Medical History  Diagnosis Date  . Paroxysmal atrial fibrillation   . TIA (transient ischemic attack) 08/2011    OSH: flushing, left LE numbness, CT negative  . CAD (coronary artery disease)     EF 55% by 2 D Echo 2008 and 2012  . Peripheral neuropathy   . Peripheral vascular disease   . Fibromuscular dysplasia     S/P PCI right renal aretery 1999  . Hx of echocardiogram 05/2012     EF >55%, LA 3.9 cm, mild LVH  . LVH (left ventricular hypertrophy)     Mild  . Hx of cardiovascular stress test 09/2010    Nuclear stress testing showing no evidence of ischemia, EF=76%, No EKG changes & no perfusion defects.  . Chronic atrial fibrillation   . Pre-diabetes 01/21/11   Chronic  . Renovascular hypertension   . Hyperlipidemia   . Tic douloureux 1994    S/P successful surgery  . Osteoporosis     With Hx of thoracic compression fractures  . Thoracic compression fracture   . DDD (degenerative disc disease)   . Osteoarthritis   . Endometrial carcinoma   . 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 Chronic fatigue   . Actinic keratosis   . Hip fracture, right 08/06/06    S/P surgery  . Chronic edema 10/15/11  . GERD (gastroesophageal reflux disease)   . Tachy-brady syndrome   . Bilateral leg weakness 09/27/12  . Encounter for long-term (current) use of other medications 09/02/12  . Hypertension     Difficult to control  . Pacemaker     Past Surgical History  Procedure Laterality Date  . Cardiac catheterization  11/05/01    EF 72%. RCA 25%. LCX 50%. Ramus 75/100%. LAD 95%. D2 75%.  . Cholecystectomy  1989  . Vaginal hysterectomy  1990  . Appendectomy  1946  . Lumbar disc surgery    . Renal artery angioplasty Right 1994    Renal artery stenosis secondary to fibromusclar dysplasia  . Renal artery angioplasty Right 09/13/97    PTA/Stent to ostial right  renal artery, No distal fibromuscular hyperplasia  . Renal angiogram  09/14/97    25% diffuse left renal artery. 50% ostia; right renal artery.  . Abdominal aortagram  09/05/01    Right & left renals 25%  . Dipyridamole stress test  09/2010    Nuclear stress testing showing no evidence of ischemia, EF=76%, No EKG changes & no perfusion defects.  . Cesarean section  1959  . Cesarean section  1961  . Spinal fusion  2002    Percutaneous spinal fusion  . Coronary angioplasty    . Total abdominal hysterectomy w/ bilateral salpingoophorectomy  1990  . Coronary angioplasty with stent placement  11/05/01    PTCA/Stent to 95% mid LAD, Ramus lesion could not be crossed and was not treated.     Prior to Admission medications   Medication Sig Start Date End Date Taking? Authorizing  Provider  aspirin EC 81 MG tablet Take 81 mg by mouth daily.   Yes Historical Provider, MD  calcium carbonate (TUMS) 500 MG chewable tablet Chew 3 tablets by mouth daily.   Yes Historical Provider, MD  cloNIDine (CATAPRES) 0.1 MG tablet Take 1 tablet in the morning and 2 tablets at night 05/07/09  Yes Historical Provider, MD  diltiazem (CARDIZEM CD) 120 MG 24 hr capsule Take 1 capsule by mouth 2 (two) times daily.  08/22/13 08/22/14 Yes Historical Provider, MD  Ergocalciferol (VITAMIN D2) 2000 UNITS TABS Take 1 tablet by mouth daily. Pt states that she takes D2 & D3 interchangeably   Yes Historical Provider, MD  metoprolol tartrate (LOPRESSOR) 25 MG tablet Take 1 tablet by mouth 2 (two) times daily.   Yes Historical Provider, MD  Polyethyl Glycol-Propyl Glycol (SYSTANE) 0.4-0.3 % SOLN Apply 1 drop to eye 4 (four) times daily.   Yes Historical Provider, MD  rosuvastatin (CRESTOR) 5 MG tablet Take 5 mg by mouth daily. 03/07/13 03/07/14 Yes Historical Provider, MD  valsartan (DIOVAN) 320 MG tablet Take 320 mg by mouth daily. 04/10/10  Yes Historical Provider, MD  warfarin (COUMADIN) 2 MG tablet Take 2-4 mg by mouth See admin instructions. Takes 2 pills (4 mg) on Mondays and Fridays. Take 1 pill (2 mg) all other days.   Yes Historical Provider, MD  furosemide (LASIX) 20 MG tablet Take 20 mg by mouth as needed for fluid.  06/13/13   Historical Provider, MD  nitroGLYCERIN (NITROSTAT) 0.4 MG SL tablet Place 0.4 mg under the tongue every 5 (five) minutes as needed for chest pain.  04/10/10   Historical Provider, MD  pantoprazole (PROTONIX) 40 MG tablet Take 40 mg by mouth as needed (acid reflux).     Historical Provider, MD  potassium chloride (K-DUR) 10 MEQ tablet Take 10 mEq by mouth as needed (take with lasix).     Historical Provider, MD    Social History:  reports that she has never smoked. She has never used smokeless tobacco. She reports that she drinks alcohol. She reports that she does not use illicit  drugs.  Family History  Problem Relation Age of Onset  . COPD Brother   . Diabetes Brother     DM  . Hypertension Brother   . Stroke Father   . Stroke Other     Review of Systems:  Constitutional: Denies fever, chills, diaphoresis, appetite change and fatigue.  HEENT: Denies photophobia, eye pain, redness, hearing loss, ear pain, congestion, sore throat, rhinorrhea, sneezing, mouth sores, trouble swallowing, neck pain, neck stiffness and tinnitus.   Respiratory: Denies SOB,  DOE, cough, chest tightness,  and wheezing.   Cardiovascular: Denies chest pain, palpitations and leg swelling.  Gastrointestinal: Denies nausea, vomiting, abdominal pain, diarrhea, constipation, blood in stool and abdominal distention.  Genitourinary: Denies dysuria, urgency, frequency, hematuria, flank pain and difficulty urinating.  Endocrine: Denies: hot or cold intolerance, sweats, changes in hair or nails, polyuria, polydipsia. Musculoskeletal: Denies myalgias, back pain, joint swelling, arthralgias and gait problem.  Skin: Denies pallor, rash and wound.  Neurological: Denies dizziness, seizures, syncope, light-headedness, numbness and headaches.  Hematological: Denies adenopathy. Easy bruising, personal or family bleeding history  Psychiatric/Behavioral: Denies suicidal ideation, mood changes, confusion, nervousness, sleep disturbance and agitation   Physical Exam: Blood pressure 150/78, pulse 80, temperature 97.7 F (36.5 C), temperature source Oral, resp. rate 13, height 5' 1.5" (1.562 m), weight 56 kg (123 lb 7.3 oz), SpO2 95.00%. General: Alert, awake, oriented x3 HEENT: Normocephalic, atraumatic, pupils equal and reactive to light, extraocular movements intact. Neck: Supple, no JVD, no lymphadenopathy, no bruits, no goiter. Cardiovascular: Irregular, no murmurs, rubs or gallops. Lungs: Clear to auscultation bilaterally. Abdomen: Soft, nontender, nondistended, positive bowel sounds, no masses or  organomegaly noted. Extremities: No clubbing, cyanosis or edema, positive pulses. Neurologic: Nonfocal, I have not ambulated her  Labs on Admission:  Results for orders placed during the hospital encounter of 01/29/14 (from the past 48 hour(s))  CBC     Status: None   Collection Time    01/29/14 10:33 AM      Result Value Ref Range   WBC 9.0  4.0 - 10.5 K/uL   RBC 4.52  3.87 - 5.11 MIL/uL   Hemoglobin 13.9  12.0 - 15.0 g/dL   HCT 39.6  36.0 - 46.0 %   MCV 87.6  78.0 - 100.0 fL   MCH 30.8  26.0 - 34.0 pg   MCHC 35.1  30.0 - 36.0 g/dL   RDW 13.3  11.5 - 15.5 %   Platelets 302  150 - 400 K/uL  COMPREHENSIVE METABOLIC PANEL     Status: Abnormal   Collection Time    01/29/14 10:33 AM      Result Value Ref Range   Sodium 125 (*) 137 - 147 mEq/L   Potassium 4.5  3.7 - 5.3 mEq/L   Chloride 87 (*) 96 - 112 mEq/L   CO2 27  19 - 32 mEq/L   Glucose, Bld 118 (*) 70 - 99 mg/dL   BUN 9  6 - 23 mg/dL   Creatinine, Ser 0.79  0.50 - 1.10 mg/dL   Calcium 9.9  8.4 - 10.5 mg/dL   Total Protein 7.1  6.0 - 8.3 g/dL   Albumin 3.4 (*) 3.5 - 5.2 g/dL   AST 25  0 - 37 U/L   ALT 20  0 - 35 U/L   Alkaline Phosphatase 104  39 - 117 U/L   Total Bilirubin 0.6  0.3 - 1.2 mg/dL   GFR calc non Af Amer 72 (*) >90 mL/min   GFR calc Af Amer 83 (*) >90 mL/min   Comment: (NOTE)     The eGFR has been calculated using the CKD EPI equation.     This calculation has not been validated in all clinical situations.     eGFR's persistently <90 mL/min signify possible Chronic Kidney     Disease.   Anion gap 11  5 - 15  PROTIME-INR     Status: Abnormal   Collection Time    01/29/14 10:33 AM  Result Value Ref Range   Prothrombin Time 24.2 (*) 11.6 - 15.2 seconds   INR 2.15 (*) 0.00 - 1.49  TROPONIN I     Status: None   Collection Time    01/29/14 10:33 AM      Result Value Ref Range   Troponin I <0.30  <0.30 ng/mL   Comment:            Due to the release kinetics of cTnI,     a negative result within the  first hours     of the onset of symptoms does not rule out     myocardial infarction with certainty.     If myocardial infarction is still suspected,     repeat the test at appropriate intervals.  URINALYSIS, ROUTINE W REFLEX MICROSCOPIC     Status: None   Collection Time    01/29/14 11:48 AM      Result Value Ref Range   Color, Urine YELLOW  YELLOW   APPearance CLEAR  CLEAR   Specific Gravity, Urine 1.010  1.005 - 1.030   pH 7.5  5.0 - 8.0   Glucose, UA NEGATIVE  NEGATIVE mg/dL   Hgb urine dipstick NEGATIVE  NEGATIVE   Bilirubin Urine NEGATIVE  NEGATIVE   Ketones, ur NEGATIVE  NEGATIVE mg/dL   Protein, ur NEGATIVE  NEGATIVE mg/dL   Urobilinogen, UA 0.2  0.0 - 1.0 mg/dL   Nitrite NEGATIVE  NEGATIVE   Leukocytes, UA NEGATIVE  NEGATIVE   Comment: MICROSCOPIC NOT DONE ON URINES WITH NEGATIVE PROTEIN, BLOOD, LEUKOCYTES, NITRITE, OR GLUCOSE <1000 mg/dL.  NA AND K (SODIUM & POTASSIUM), RAND UR     Status: None   Collection Time    01/29/14 11:48 AM      Result Value Ref Range   Sodium, Ur 68     Potassium Urine Timed 23      Radiological Exams on Admission: Dg Chest 2 View  01/29/2014   CLINICAL DATA:  Weakness, cough, history coronary artery disease, LEFT ventricular hypertrophy, hyperlipidemia, GERD, endometrial cancer, hypertension  EXAM: CHEST  2 VIEW  COMPARISON:  01/15/2014  FINDINGS: LEFT subclavian transvenous pacemaker lead projects at RIGHT ventricle.  Upper normal heart size.  Calcified tortuous aorta.  Mediastinal contours and pulmonary vascularity normal.  Chronic basilar interstitial changes and minimal central peribronchial thickening.  No definite acute infiltrate, pleural effusion or pneumothorax.  Bones demineralized.  Prior lumbar fusion.  IMPRESSION: Chronic basilar interstitial disease and chronic bronchitic changes.  No acute abnormalities.   Electronically Signed   By: Lavonia Dana M.D.   On: 01/29/2014 11:22    Assessment/Plan Principal Problem:    Hyponatremia Active Problems:   Generalized weakness   Atrial fibrillation   Essential hypertension   Dyslipidemia   Cardiac pacemaker in situ    Generalized weakness -Suspect secondary to hyponatremia. -Will check a TSH and vitamin B 12 for completeness. -Obtain PT/OT evaluations.  Hyponatremia -Sodium is 125 and was 134 on October 11. -Patient admits to decreased by mouth fluid and food intake since her husband has been in rehabilitation. -Start normal saline, check TSH/B12/urine osmolality. -At some point she had developed hyponatremia before and it was thought to be SIADH due to Tegretol which she is no longer on.  Atrial fibrillation -Rate controlled. -Continue anticoagulation with Coumadin, pharmacy to dose.  Hyperlipidemia -Continue statin  Hypertension -Blood pressure is currently controlled. -Continue home medications.  DVT prophylaxis -Is fully anticoagulated on Coumadin  CODE STATUS -Full code  Time Spent on Admission: 80 minutes  HERNANDEZ ACOSTA,ESTELA [facility] Pager: [phone] 01/29/2014, 4:24 PM

## 2014-01-29 NOTE — ED Notes (Signed)
Patient reports hypertension with malaise feeling since Wednesday. Denies any headaches or dizziness.  Patient reports taking Metoprolol, Diovan, and Cardizem for blood pressure. Per patient highest blood pressure noted 169/109.

## 2014-01-29 NOTE — ED Notes (Signed)
Pt. Given water.  Approved with MD prior.

## 2014-01-30 LAB — CBC
HEMATOCRIT: 39.1 % (ref 36.0–46.0)
Hemoglobin: 13.5 g/dL (ref 12.0–15.0)
MCH: 30.4 pg (ref 26.0–34.0)
MCHC: 34.5 g/dL (ref 30.0–36.0)
MCV: 88.1 fL (ref 78.0–100.0)
PLATELETS: 313 10*3/uL (ref 150–400)
RBC: 4.44 MIL/uL (ref 3.87–5.11)
RDW: 13.4 % (ref 11.5–15.5)
WBC: 9.3 10*3/uL (ref 4.0–10.5)

## 2014-01-30 LAB — BASIC METABOLIC PANEL
Anion gap: 8 (ref 5–15)
BUN: 10 mg/dL (ref 6–23)
CALCIUM: 9 mg/dL (ref 8.4–10.5)
CO2: 28 mEq/L (ref 19–32)
CREATININE: 0.84 mg/dL (ref 0.50–1.10)
Chloride: 97 mEq/L (ref 96–112)
GFR calc Af Amer: 69 mL/min — ABNORMAL LOW (ref >90)
GFR, EST NON AFRICAN AMERICAN: 60 mL/min — AB (ref >90)
GLUCOSE: 118 mg/dL — AB (ref 70–99)
Potassium: 5 mEq/L (ref 3.7–5.3)
SODIUM: 133 meq/L — AB (ref 137–147)

## 2014-01-30 LAB — PROTIME-INR
INR: 1.9 — ABNORMAL HIGH (ref 0.00–1.49)
Prothrombin Time: 21.9 seconds — ABNORMAL HIGH (ref 11.6–15.2)

## 2014-01-30 LAB — OSMOLALITY, URINE: Osmolality, Ur: 273 mOsm/kg — ABNORMAL LOW (ref 390–1090)

## 2014-01-30 LAB — VITAMIN B12: VITAMIN B 12: 568 pg/mL (ref 211–911)

## 2014-01-30 LAB — TSH: TSH: 1.26 u[IU]/mL (ref 0.350–4.500)

## 2014-01-30 LAB — CORTISOL: Cortisol, Plasma: 6.6 ug/dL

## 2014-01-30 MED ORDER — ALUM & MAG HYDROXIDE-SIMETH 200-200-20 MG/5ML PO SUSP
15.0000 mL | ORAL | Status: DC | PRN
  Administered 2014-01-30: 15 mL via ORAL
  Filled 2014-01-30: qty 30

## 2014-01-30 MED ORDER — WARFARIN SODIUM 2 MG PO TABS: 4.0000 mg | ORAL_TABLET | Freq: Once | ORAL | Status: DC

## 2014-01-30 NOTE — Progress Notes (Signed)
ANTICOAGULATION CONSULT NOTE - follow up  Pharmacy Consult for Coumadin (chronic Rx PTA) Indication: atrial fibrillation  Allergies  Allergen Reactions  . Amiodarone Other (See Comments)    Worsening peripheral neuropathy  . Ace Inhibitors Hives  . Meperidine And Related   . Prednisone   . Tikosyn [Dofetilide] Other (See Comments)    QT prolongation   Patient Measurements: Height: 5' 1.5" (156.2 cm) Weight: 123 lb 7.3 oz (56 kg) IBW/kg (Calculated) : 48.95  Vital Signs: Temp: 98.4 F (36.9 C) (10/26 0527) Temp Source: Oral (10/26 0527) BP: 134/87 mmHg (10/26 0527) Pulse Rate: 89 (10/26 0527)  Labs:  Recent Labs  01/29/14 1033 01/30/14 0628  HGB 13.9 13.5  HCT 39.6 39.1  PLT 302 313  LABPROT 24.2* 21.9*  INR 2.15* 1.90*  CREATININE 0.79 0.84  TROPONINI <0.30  --    Estimated Creatinine Clearance: 35.1 ml/min (by C-G formula based on Cr of 0.84).  Medical History: Past Medical History  Diagnosis Date  . Paroxysmal atrial fibrillation   . TIA (transient ischemic attack) 08/2011    OSH: flushing, left LE numbness, CT negative  . CAD (coronary artery disease)     EF 55% by 2 D Echo 2008 and 2012  . Peripheral neuropathy   . Peripheral vascular disease   . Fibromuscular dysplasia     S/P PCI right renal aretery 1999  . Hx of echocardiogram 05/2012     EF >55%, LA 3.9 cm, mild LVH  . LVH (left ventricular hypertrophy)     Mild  . Hx of cardiovascular stress test 09/2010    Nuclear stress testing showing no evidence of ischemia, EF=76%, No EKG changes & no perfusion defects.  . Chronic atrial fibrillation   . Pre-diabetes 01/21/11    Chronic  . Renovascular hypertension   . Hyperlipidemia   . Tic [place] 1994    S/P successful surgery  . Osteoporosis     With Hx of thoracic compression fractures  . Thoracic compression fracture   . DDD (degenerative disc disease)   . Osteoarthritis   . Endometrial carcinoma   . Hyponatremia     Hypomatremia/SIADH,  possibly secondary to Tegretol  . Chronic fatigue   . Actinic keratosis   . Hip fracture, right 08/06/06    S/P surgery  . Chronic edema 10/15/11  . GERD (gastroesophageal reflux disease)   . Tachy-brady syndrome   . Bilateral leg weakness 09/27/12  . Encounter for long-term (current) use of other medications 09/02/12  . Hypertension     Difficult to control  . Pacemaker    Medications:  Prescriptions prior to admission  Medication Sig Dispense Refill  . aspirin EC 81 MG tablet Take 81 mg by mouth daily.      . calcium carbonate (TUMS) 500 MG chewable tablet Chew 3 tablets by mouth daily.      . cloNIDine (CATAPRES) 0.1 MG tablet Take 1 tablet in the morning and 2 tablets at night      . diltiazem (CARDIZEM CD) 120 MG 24 hr capsule Take 1 capsule by mouth 2 (two) times daily.       . Ergocalciferol (VITAMIN D2) 2000 UNITS TABS Take 1 tablet by mouth daily. Pt states that she takes D2 & D3 interchangeably      . metoprolol tartrate (LOPRESSOR) 25 MG tablet Take 1 tablet by mouth 2 (two) times daily.      Vladimir Faster Glycol-Propyl Glycol (SYSTANE) 0.4-0.3 % SOLN Apply 1 drop to eye  4 (four) times daily.      . rosuvastatin (CRESTOR) 5 MG tablet Take 5 mg by mouth daily.      . valsartan (DIOVAN) 320 MG tablet Take 320 mg by mouth daily.      Marland Kitchen warfarin (COUMADIN) 2 MG tablet Take 2-4 mg by mouth See admin instructions. Takes 2 pills (4 mg) on Mondays and Fridays. Take 1 pill (2 mg) all other days.      . furosemide (LASIX) 20 MG tablet Take 20 mg by mouth as needed for fluid.       . nitroGLYCERIN (NITROSTAT) 0.4 MG SL tablet Place 0.4 mg under the tongue every 5 (five) minutes as needed for chest pain.       . pantoprazole (PROTONIX) 40 MG tablet Take 40 mg by mouth as needed (acid reflux).       . potassium chloride (K-DUR) 10 MEQ tablet Take 10 mEq by mouth as needed (take with lasix).        Assessment: 80yo female on chronic Coumadin PTA for h/o afib.  INR has trended down to slightly  below goal.  Home dose listed above.    Goal of Therapy:  INR 2-3 Monitor platelets by anticoagulation protocol: Yes   Plan:  Coumadin 4mg  po today x 1 (home dose) INR daily  [place] Crane, Rainna Nearhood A 01/30/2014,8:55 AM

## 2014-01-30 NOTE — Care Management Note (Signed)
    Page 1 of 1   01/30/2014     4:09:12 PM CARE MANAGEMENT NOTE 01/30/2014  Patient:  Karina [name], Karina [name]   Account Number:  [id number]  Date Initiated:  01/30/2014  Documentation initiated by:  Vladimir Creeks  Subjective/Objective Assessment:   Admitted with  low NA+. Pt is from home, with spouse, and will return home at D/C.  Spouse is actually in the [facility] at present. PT recommned OP PT, and pt would like to go to [facility].     Action/Plan:   Rx faxed to [facility], and called to set upp also, and they will call her for appt.   Anticipated DC Date:  01/30/2014   Anticipated DC Plan:  [place]  CM consult      Choice offered to / List presented to:             Status of service:  Completed, signed off Medicare Important Message given?  NA - LOS <3 / Initial given by admissions (If response is "NO", the following Medicare IM given date fields will be blank) Date Medicare IM given:   Medicare IM given by:   Date Additional Medicare IM given:   Additional Medicare IM given by:    Discharge Disposition:  HOME/SELF CARE  Per UR Regulation:  Reviewed for med. necessity/level of care/duration of stay  If discussed at [place] of Stay Meetings, dates discussed:    Comments:  01/30/14 [place] RN/CM

## 2014-01-30 NOTE — Discharge Summary (Signed)
Physician Discharge Summary  Karina [name] YBO:[id number] DOB: 1922-05-13 DOA: 01/29/2014  PCP: [name], MD  Admit date: 01/29/2014 Discharge date: 01/30/2014  Time spent: 45 minutes  Recommendations for Outpatient Follow-up:  -Will be discharged home today. -Advised to follow up with PCP in 2 weeks.   Discharge Diagnoses:  Principal Problem:   Hyponatremia Active Problems:   Generalized weakness   Atrial fibrillation   Essential hypertension   Dyslipidemia   Cardiac pacemaker in situ   Discharge Condition: Stable and improved  Filed Weights   01/29/14 1016 01/29/14 1331  Weight: 54.885 kg (121 lb) 56 kg (123 lb 7.3 oz)    History of present illness:  Patient is a pleasant 80 year old woman with past medical history significant for atrial fibrillation status post pacemaker, hypertension, hyperlipidemia who presents to the hospital today with the above-mentioned complaints. She states that she has noted that for the past 2-3 days her blood pressure has been elevated in the range of 160/100. She is also noticed increased generalized weakness to the point she feels very fatigued and is having difficulty getting out of bed. 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 In the ED she was found to have a sodium level of 125 and is referred to [place] for admission for further evaluation and management.   Hospital Course:   Generalized Weakness -2/2 hyponatremia. -Resolved. -PT/OT recommend OP therapy. -B12 level ok.  Hyponatremia -Na up to 133 from 125 on admission with IVF. -No further intervention.  Atrial fibrillation  -Rate controlled.  -Continue anticoagulation with Coumadin, pharmacy to dose.   Hyperlipidemia  -Continue statin   Hypertension  -Blood pressure is currently controlled.   -Continue home medications.      Procedures:  None   Consultations:  None  Discharge Instructions  Discharge Instructions   Increase activity slowly    Complete by:  As directed             Medication List    STOP taking these medications       furosemide 20 MG tablet  Commonly known as:  LASIX     potassium chloride 10 MEQ tablet  Commonly known as:  K-DUR      TAKE these medications       aspirin EC 81 MG tablet  Take 81 mg by mouth daily.     cloNIDine 0.1 MG tablet  Commonly known as:  CATAPRES  Take 1 tablet in the morning and 2 tablets at night     diltiazem 120 MG 24 hr capsule  Commonly known as:  CARDIZEM CD  Take 1 capsule by mouth 2 (two) times daily.     DIOVAN 320 MG tablet  Generic drug:  valsartan  Take 320 mg by mouth daily.     metoprolol tartrate 25 MG tablet  Commonly known as:  LOPRESSOR  Take 1 tablet by mouth 2 (two) times daily.     NITROSTAT 0.4 MG SL tablet  Generic drug:  nitroGLYCERIN  Place 0.4 mg under the tongue every 5 (five) minutes as needed for chest pain.     pantoprazole 40 MG tablet  Commonly known as:  PROTONIX  Take 40 mg by mouth as needed (acid reflux).     rosuvastatin 5 MG tablet  Commonly known as:  CRESTOR  Take 5 mg by mouth daily.     SYSTANE 0.4-0.3 % Soln  Generic drug:  Polyethyl Glycol-Propyl Glycol  Apply 1 drop to eye 4 (four) times daily.     TUMS 500 MG chewable tablet  Generic drug:  calcium carbonate  Chew 3 tablets by mouth daily.     Vitamin D2 2000 UNITS Tabs  Take 1 tablet by mouth daily. Pt states that she takes D2 & D3 interchangeably     warfarin 2 MG tablet  Commonly known as:  COUMADIN  Take 2-4 mg by mouth See admin instructions. Takes 2 pills (4 mg) on Mondays and Fridays. Take 1 pill (2 mg) all other days.       Allergies  Allergen Reactions  . Amiodarone Other (See Comments)    Worsening peripheral neuropathy  . Ace Inhibitors Hives  . Meperidine And Related    . Prednisone   . Tikosyn [Dofetilide] Other (See Comments)    QT prolongation       Follow-up Information   Follow up with [facility], [place]. Schedule an appointment as soon as possible for a visit in 2 weeks.   Specialty:  Internal Medicine   Contact information:   [address] [address] [phone]        The results of significant diagnostics from this hospitalization (including imaging, microbiology, ancillary and laboratory) are listed below for reference.    Significant Diagnostic Studies: Dg Chest 2 View  01/29/2014   CLINICAL DATA:  Weakness, cough, history coronary artery disease, LEFT ventricular hypertrophy, hyperlipidemia, GERD, endometrial cancer, hypertension  EXAM: CHEST  2 VIEW  COMPARISON:  01/15/2014  FINDINGS: LEFT subclavian transvenous pacemaker lead projects at RIGHT ventricle.  Upper normal heart size.  Calcified tortuous aorta.  Mediastinal contours and pulmonary vascularity normal.  Chronic basilar interstitial changes and minimal central peribronchial thickening.  No definite acute infiltrate, pleural effusion or pneumothorax.  Bones demineralized.  Prior lumbar fusion.  IMPRESSION: Chronic basilar interstitial disease and chronic bronchitic changes.  No acute abnormalities.   Electronically Signed   By: Lavonia Dana M.D.   On: 01/29/2014 11:22   Dg Chest 2 View  01/15/2014   CLINICAL DATA:  Weakness.  EXAM: CHEST - 2 VIEW  COMPARISON:  03/11/2013  FINDINGS: Interval placement of single chamber pacemaker with lead extending to the right ventricular apex. The heart size and mediastinal contours are normal. There is no evidence of pulmonary edema, consolidation, pneumothorax, nodule or pleural fluid. The bony thorax is unremarkable.  IMPRESSION: No active disease.   Electronically Signed   By: Aletta Edouard M.D.   On: 01/15/2014 10:26    Microbiology: No results found for this or any previous visit (from the past 240 hour(s)).   Labs: Basic  Metabolic Panel:  Recent Labs Lab 01/29/14 1033 01/30/14 0628  NA 125* 133*  K 4.5 5.0  CL 87* 97  CO2 27 28  GLUCOSE 118* 118*  BUN 9 10  CREATININE 0.79 0.84  CALCIUM 9.9 9.0   Liver Function Tests:  Recent Labs Lab 01/29/14 1033  AST 25  ALT 20  ALKPHOS 104  BILITOT 0.6  PROT 7.1  ALBUMIN 3.4*   No results found for this basename: LIPASE, AMYLASE,  in the last 168 hours No results found for this basename: AMMONIA,  in the last 168 hours CBC:  Recent Labs Lab 01/29/14 1033 01/30/14 0628  WBC 9.0 9.3  HGB 13.9 13.5  HCT 39.6 39.1  MCV 87.6 88.1  PLT 302 313   Cardiac Enzymes:  Recent Labs Lab 01/29/14 1033  TROPONINI <0.30   BNP: BNP (last 3 results) No results found for this basename: PROBNP,  in the last 8760 hours CBG: No results found for this basename: GLUCAP,  in the last 168 hours     Signed:  Lelon Frohlich  [facility] Pager: [phone] 01/30/2014, 2:50 PM

## 2014-01-30 NOTE — Care Management Utilization Note (Signed)
UR completed 

## 2014-01-30 NOTE — Evaluation (Signed)
Physical Therapy Evaluation Patient Details Name: Karina [name] MRN: [id number] DOB: 1923/02/01 Today's Date: 01/30/2014   History of Present Illness  Patient is a pleasant 80 year old woman with past medical history significant for atrial fibrillation status post pacemaker, hypertension, hyperlipidemia who presents to the hospital today with the above-mentioned complaints. She states that she has noted that for the past 2-3 days her blood pressure has been elevated in the range of 160/100. She is also noticed increased generalized weakness to the point she feels very fatigued and is having difficulty getting out of bed. 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 In the ED she was found to have a sodium level of 125 and is referred to [place] for admission for further evaluation and management.  Clinical Impression  Pt is a 80 year old female who presents to PT with dx of hyponatremia.  During evaluation, pt (I) with bed mobility skills, transfers, and mod (I)/supervision with gait for 450 feet without AD.  Pt occassionally held onto handrail in hallways during gait, particularly with turning corners or traffic in hallways, and reports occasional furniture walking at home usually when not wearing orthotics.  Pt has a hx of peripheral neuropathy, and reports wear of (B) orthotics secondary to foot drop for the past 8 years.  Pt is at baseline level of function and will be d/c from acute PT services. Pt would benefit from OOPT services for continued strengthening, secondary to reports of feeling "weak as a kitten" and hx of peripheral neuropathy.  No DME recommendations.     Follow Up Recommendations Outpatient PT    Equipment Recommendations  None recommended by PT    Recommendations for Other Services OT consult      Precautions / Restrictions Precautions Precautions: Fall Precaution Comments: Wear of (B) orthotics for functional mobility skills Restrictions Weight Bearing Restrictions: No      Mobility  Bed Mobility Overal bed mobility: Independent                Transfers Overall transfer level: Independent               General transfer comment: (I) with (B) braces on  Ambulation/Gait Ambulation/Gait assistance: Modified independent (Device/Increase time);Supervision Ambulation Distance (Feet): 450 Feet Assistive device: 1 person hand held assist (Ocassional 1 HHA on rail in hallway, particularly with turning corners/traffic in hallways) Gait Pattern/deviations: Decreased dorsiflexion - right;Decreased dorsiflexion - left   Gait velocity interpretation: Below normal speed for age/gender General Gait Details: Decreased DF (B) LE secondary to 8 year hx of peripheral neuropathy/wear of orthotics secondry to foot drop     Balance Overall balance assessment: No apparent balance deficits (not formally assessed)                                           Pertinent Vitals/Pain Pain Assessment: No/denies pain    Home Living Family/patient expects to be discharged to:: Private residence Living Arrangements: Spouse/significant other (Lives with husband (who is currenlty in SNF for rehab), son is down from [place] right now to assist as needed, pt reports aide to assist with shopping, cleaning, cooking M-F) Available Help at Discharge: Family;Available 24 hours/day;Friend(s) Type  of Home: House Home Access: Stairs to enter Entrance Stairs-Rails: None Entrance Stairs-Number of Steps: 1 Home Layout: Multi-level Home Equipment: Environmental consultant - 2 wheels;Walker - standard;Cane - single point;Shower seat Additional Comments: Walk In Shower    Prior Function Level of Independence: Independent;Needs assistance   Gait / Transfers Assistance Needed: Pt reports she was  (I)/mod (I) with bed mobility skills, transfers, and ambulation skills without assistance.  Pt reports  ocassional furniture walking when not wearing (B) orthotics.   ADL's / Homemaking Assistance Needed: Pt reports she has an aide for shopping, cooking, cleaning M-F.        Hand Dominance        Extremity/Trunk Assessment   Upper Extremity Assessment: Defer to OT evaluation           Lower Extremity Assessment: Generalized weakness         Communication   Communication: No difficulties  Cognition Arousal/Alertness: Awake/alert Behavior During Therapy: WFL for tasks assessed/performed Overall Cognitive Status: Within Functional Limits for tasks assessed                      General Comments      Exercises        Assessment/Plan    PT Assessment All further PT needs can be met in the next venue of care  PT Diagnosis Abnormality of gait;Generalized weakness   PT Problem List Decreased strength;Decreased activity tolerance;Decreased mobility  PT Treatment Interventions     PT Goals (Current goals can be found in the Care Plan section) Acute Rehab PT Goals PT Goal Formulation: All assessment and education complete, DC therapy     End of Session Equipment Utilized During Treatment: Gait belt Activity Tolerance: Patient tolerated treatment well Patient left: in chair;with call bell/phone within reach;with chair alarm set           Time: [phone] PT Time Calculation (min): 26 min   Charges:   PT Evaluation $Initial PT Evaluation Tier I: 1 Procedure     Hannahgrace Lalli 01/30/2014, 8:59 AM

## 2014-01-30 NOTE — Progress Notes (Signed)
OT Cancellation Note  Patient Details Name: Karina [name] MRN: [id number] DOB: 10-14-1922   Cancelled Treatment:    Reason Eval/Treat Not Completed: OT screened, no needs identified, will sign off.  Pt is demonstrating WFL strength and ROM in BUE.  She demonstrated good skills with ADLs and has no concerns about continuing to complete upon d/c home. Pt needs no further OT services at this time.  Bea Graff Biff Rutigliano, MS, OTR/L [facility] [phone] 01/30/2014, 8:59 AM

## 2014-01-30 NOTE — Progress Notes (Signed)
Discharged home with instructions given on medications,and follow up visits,patient verbalized understanding. No C/O pain or discomfort noted. Accompanied by staff to an awaiting vehicle. 

## 2014-02-06 ENCOUNTER — Encounter (HOSPITAL_COMMUNITY): Payer: Self-pay | Admitting: Emergency Medicine

## 2014-03-23 ENCOUNTER — Ambulatory Visit (INDEPENDENT_AMBULATORY_CARE_PROVIDER_SITE_OTHER): Payer: Medicare Other | Admitting: *Deleted

## 2014-03-23 DIAGNOSIS — I481 Persistent atrial fibrillation: Secondary | ICD-10-CM

## 2014-03-23 DIAGNOSIS — I4819 Other persistent atrial fibrillation: Secondary | ICD-10-CM

## 2014-03-23 NOTE — Progress Notes (Signed)
Remote pacemaker transmission.   

## 2014-03-24 LAB — MDC_IDC_ENUM_SESS_TYPE_REMOTE
Brady Statistic RV Percent Paced: 93 %
Lead Channel Pacing Threshold Amplitude: 0.6 V
Lead Channel Pacing Threshold Pulse Width: 0.4 ms
Lead Channel Setting Pacing Pulse Width: 0.4 ms
MDC IDC MSMT LEADCHNL RV IMPEDANCE VALUE: 605 Ohm
MDC IDC MSMT LEADCHNL RV SENSING INTR AMPL: 13.5 mV
MDC IDC PG SERIAL: 68133668
MDC IDC SET LEADCHNL RV PACING AMPLITUDE: 2.5 V
MDC IDC SET LEADCHNL RV SENSING SENSITIVITY: 2.5 mV

## 2014-04-04 ENCOUNTER — Encounter: Payer: Self-pay | Admitting: Cardiology

## 2014-04-19 ENCOUNTER — Encounter: Payer: Self-pay | Admitting: Internal Medicine

## 2014-05-01 ENCOUNTER — Encounter: Payer: Self-pay | Admitting: Internal Medicine

## 2014-07-14 ENCOUNTER — Encounter: Payer: Self-pay | Admitting: Internal Medicine

## 2014-07-14 ENCOUNTER — Ambulatory Visit (INDEPENDENT_AMBULATORY_CARE_PROVIDER_SITE_OTHER): Payer: Medicare Other | Admitting: Internal Medicine

## 2014-07-14 VITALS — BP 110/72 | HR 70 | Wt 128.5 lb

## 2014-07-14 DIAGNOSIS — I482 Chronic atrial fibrillation, unspecified: Secondary | ICD-10-CM

## 2014-07-14 DIAGNOSIS — Z950 Presence of cardiac pacemaker: Secondary | ICD-10-CM

## 2014-07-14 DIAGNOSIS — I2510 Atherosclerotic heart disease of native coronary artery without angina pectoris: Secondary | ICD-10-CM | POA: Diagnosis not present

## 2014-07-14 DIAGNOSIS — R001 Bradycardia, unspecified: Secondary | ICD-10-CM | POA: Diagnosis not present

## 2014-07-14 DIAGNOSIS — R0600 Dyspnea, unspecified: Secondary | ICD-10-CM

## 2014-07-14 DIAGNOSIS — I10 Essential (primary) hypertension: Secondary | ICD-10-CM | POA: Diagnosis not present

## 2014-07-14 LAB — MDC_IDC_ENUM_SESS_TYPE_INCLINIC
Brady Statistic RV Percent Paced: 91 %
Implantable Pulse Generator Serial Number: 68133668
Lead Channel Impedance Value: 585 Ohm
Lead Channel Pacing Threshold Amplitude: 0.6 V
Lead Channel Setting Pacing Amplitude: 1.1 V
Lead Channel Setting Pacing Pulse Width: 0.4 ms
Lead Channel Setting Sensing Sensitivity: 2.5 mV
MDC IDC MSMT LEADCHNL RV PACING THRESHOLD PULSEWIDTH: 0.4 ms
MDC IDC MSMT LEADCHNL RV SENSING INTR AMPL: 14 mV
MDC IDC SESS DTM: 20160408110806

## 2014-07-14 NOTE — Assessment & Plan Note (Signed)
Her ventricular rate is well controlled. No change in meds.

## 2014-07-14 NOTE — Assessment & Plan Note (Signed)
She has occaisional episodes. I have asked to take a diuretic as needed. She is encouraged to maintain a low sodium diet.

## 2014-07-14 NOTE — Assessment & Plan Note (Signed)
Her blood pressure is well controlled. No change in meds. 

## 2014-07-14 NOTE — Progress Notes (Signed)
HPI Karina [name] returns today for ongoing treatment of her atrial fibrillation. She is a very pleasant 81 yo woman with a h/o atrial fibrillation, symptomatic bradycardia, CAD, s/p PTCA and stenting of her LAD remotely, who developed atrial fibrillation a couple of years ago which has no been persistent. She was tried on both Tikosyn and amiodarone without success and is now on a strategy of rate control. She denies peripheral edema or syncope. She has peripheral neuropathy. She had a Biotronik DDD PM placed. In the interim, her husband has passed away.  Allergies  Allergen Reactions  . Amiodarone Other (See Comments)    Worsening peripheral neuropathy  . Ace Inhibitors Hives  . Meperidine And Related   . Prednisone   . Tikosyn [Dofetilide] Other (See Comments)    QT prolongation     Current Outpatient Prescriptions  Medication Sig Dispense Refill  . aspirin EC 81 MG tablet Take 81 mg by mouth daily.    . calcium carbonate (TUMS) 500 MG chewable tablet Chew 3 tablets by mouth daily.    . cloNIDine (CATAPRES) 0.1 MG tablet Take 1 tablet in the morning and 2 tablets at night    . diltiazem (CARDIZEM CD) 120 MG 24 hr capsule Take 1 capsule by mouth 2 (two) times daily.     . Ergocalciferol (VITAMIN D2) 2000 UNITS TABS Take 1 tablet by mouth daily. Pt states that she takes D2 & D3 interchangeably    . metoprolol tartrate (LOPRESSOR) 25 MG tablet Take 1 tablet by mouth 2 (two) times daily.    . nitroGLYCERIN (NITROSTAT) 0.4 MG SL tablet Place 0.4 mg under the tongue every 5 (five) minutes as needed for chest pain.     . pantoprazole (PROTONIX) 40 MG tablet Take 40 mg by mouth as needed (acid reflux).     Vladimir Faster Glycol-Propyl Glycol (SYSTANE) 0.4-0.3 % SOLN Apply 1 drop to eye 4 (four) times daily.    . rosuvastatin (CRESTOR) 5 MG tablet Take 5 mg by mouth daily.    . valsartan (DIOVAN) 320 MG tablet Take 320 mg by mouth daily.    Marland Kitchen warfarin (COUMADIN) 2 MG tablet Take 2-4 mg  by mouth See admin instructions. Takes 2 pills (4 mg) on Mondays and Fridays. Take 1 pill (2 mg) all other days.     No current facility-administered medications for this visit.     Past Medical History  Diagnosis Date  . Paroxysmal atrial fibrillation   . TIA (transient ischemic attack) 08/2011    OSH: flushing, left LE numbness, CT negative  . CAD (coronary artery disease)     EF 55% by 2 D Echo 2008 and 2012  . Peripheral neuropathy   . Peripheral vascular disease   . Fibromuscular dysplasia     S/P PCI right renal aretery 1999  . Hx of echocardiogram 05/2012     EF >55%, LA 3.9 cm, mild LVH  . LVH (left ventricular hypertrophy)     Mild  . Hx of cardiovascular stress test 09/2010    Nuclear stress testing showing no evidence of ischemia, EF=76%, No EKG changes & no perfusion defects.  . Chronic atrial fibrillation   . Pre-diabetes 01/21/11    Chronic  . Renovascular hypertension   . Hyperlipidemia   . Tic dou[place]oure[place] 1994    S/P successful surgery  . Osteoporosis     With Hx of thoracic compression fractures  . Thoracic compression fracture   . DDD (degenerative  disc disease)   . Osteoarthritis   . Endometrial carcinoma   . 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 Chronic fatigue   . Actinic keratosis   . Hip fracture, right 08/06/06    S/P surgery  . Chronic edema 10/15/11  . GERD (gastroesophageal reflux disease)   . Tachy-brady syndrome   . Bilateral leg weakness 09/27/12  . Encounter for long-term (current) use of other medications 09/02/12  . Hypertension     Difficult to control  . Pacemaker     ROS:   All systems reviewed and negative except as noted in the HPI.   Past Surgical History  Procedure Laterality Date  . Cardiac catheterization  11/05/01    EF 72%. RCA 25%. LCX 50%. Ramus 75/100%. LAD 95%. D2 75%.  . Cholecystectomy  1989  . Vaginal hysterectomy  1990  . Appendectomy  1946  . Lumbar disc surgery    . Renal  artery angioplasty Right 1994    Renal artery stenosis secondary to fibromusclar dysplasia  . Renal artery angioplasty Right 09/13/97    PTA/Stent to ostial right renal artery, No distal fibromuscular hyperplasia  . Renal angiogram  09/14/97    25% diffuse left renal artery. 50% ostia; right renal artery.  . Abdominal aortagram  09/05/01    Right & left renals 25%  . Dipyridamole stress test  09/2010    Nuclear stress testing showing no evidence of ischemia, EF=76%, No EKG changes & no perfusion defects.  . Cesarean section  1959  . Cesarean section  1961  . Spinal fusion  2002    Percutaneous spinal fusion  . Coronary angioplasty    . Total abdominal hysterectomy w/ bilateral salpingoophorectomy  1990  . Coronary angioplasty with stent placement  11/05/01    PTCA/Stent to 95% mid LAD, Ramus lesion could not be crossed and was not treated.      Family History  Problem Relation Age of Onset  . COPD Brother   . Diabetes Brother     DM  . Hypertension Brother   . Stroke Father   . Stroke Other      History   Social History  . Marital Status: Married    Spouse Name: N/A  . Number of Children: N/A  . Years of Education: N/A   Occupational History  . Not on file.   Social History Main Topics  . Smoking status: Never Smoker   . Smokeless tobacco: Never Used  . Alcohol Use: Yes     Comment: Rarely  . Drug Use: No  . Sexual Activity: Not Currently   Other Topics Concern  . Not on file   Social History Narrative     BP 110/72 mmHg  Pulse 70  Wt 128 lb 8 oz (58.287 kg)  Physical Exam:  Well appearing 81 yo woman, NAD HEENT: Unremarkable Neck:  No JVD, no thyromegally Back:  No CVA tenderness Lungs:  Clear with no wheezes HEART:  Regular rate rhythm, no murmurs, no rubs, no clicks Abd:  soft, positive bowel sounds, no organomegally, no rebound, no guarding Ext:  2 plus pulses, no edema, no cyanosis, no clubbing Skin:  No rashes no nodules Neuro:  CN II through XII  intact, motor grossly intact   DEVICE  Normal device function.  See PaceArt for details. She is pacing in the ventricle 91% of the time.  Assess/Plan:

## 2014-07-14 NOTE — Patient Instructions (Addendum)

## 2014-07-14 NOTE — Assessment & Plan Note (Signed)
Her Biotronik single chamber PM is working normally. She is pacing 91% of the time. Will follow.

## 2014-07-14 NOTE — Assessment & Plan Note (Signed)
She denies anginal symptoms. Will follow.  

## 2014-08-30 ENCOUNTER — Other Ambulatory Visit: Payer: Self-pay | Admitting: Ophthalmology

## 2014-10-16 ENCOUNTER — Ambulatory Visit (INDEPENDENT_AMBULATORY_CARE_PROVIDER_SITE_OTHER): Payer: Medicare Other | Admitting: *Deleted

## 2014-10-16 ENCOUNTER — Encounter: Payer: Self-pay | Admitting: Internal Medicine

## 2014-10-16 DIAGNOSIS — I4891 Unspecified atrial fibrillation: Secondary | ICD-10-CM

## 2014-10-16 NOTE — Progress Notes (Signed)
Remote pacemaker transmission.   

## 2014-10-18 LAB — CUP PACEART REMOTE DEVICE CHECK
Date Time Interrogation Session: 20160713164957
Lead Channel Pacing Threshold Amplitude: 0.6 V
Lead Channel Pacing Threshold Pulse Width: 0.4 ms
Lead Channel Sensing Intrinsic Amplitude: 12.1 mV
Lead Channel Setting Pacing Amplitude: 1.1 V
Lead Channel Setting Pacing Pulse Width: 0.4 ms
MDC IDC MSMT LEADCHNL RV IMPEDANCE VALUE: 585 Ohm
MDC IDC SET LEADCHNL RV SENSING SENSITIVITY: 2.5 mV
MDC IDC STAT BRADY RV PERCENT PACED: 85 %
Pulse Gen Serial Number: 68133668

## 2014-10-23 ENCOUNTER — Encounter (HOSPITAL_COMMUNITY): Payer: Self-pay | Admitting: Emergency Medicine

## 2014-10-23 ENCOUNTER — Emergency Department (HOSPITAL_COMMUNITY)
Admission: EM | Admit: 2014-10-23 | Discharge: 2014-10-23 | Disposition: A | Discharge status: Home / Self Care | Payer: Medicare Other | Attending: Emergency Medicine | Admitting: Emergency Medicine

## 2014-10-23 ENCOUNTER — Emergency Department (HOSPITAL_COMMUNITY): Payer: Medicare Other

## 2014-10-23 ENCOUNTER — Telehealth: Payer: Self-pay

## 2014-10-23 DIAGNOSIS — Z8542 Personal history of malignant neoplasm of other parts of uterus: Secondary | ICD-10-CM | POA: Insufficient documentation

## 2014-10-23 DIAGNOSIS — K219 Gastro-esophageal reflux disease without esophagitis: Secondary | ICD-10-CM | POA: Insufficient documentation

## 2014-10-23 DIAGNOSIS — M810 Age-related osteoporosis without current pathological fracture: Secondary | ICD-10-CM | POA: Insufficient documentation

## 2014-10-23 DIAGNOSIS — Z7901 Long term (current) use of anticoagulants: Secondary | ICD-10-CM | POA: Diagnosis not present

## 2014-10-23 DIAGNOSIS — Z7982 Long term (current) use of aspirin: Secondary | ICD-10-CM | POA: Diagnosis not present

## 2014-10-23 DIAGNOSIS — Z8669 Personal history of other diseases of the nervous system and sense organs: Secondary | ICD-10-CM | POA: Insufficient documentation

## 2014-10-23 DIAGNOSIS — R079 Chest pain, unspecified: Secondary | ICD-10-CM | POA: Diagnosis present

## 2014-10-23 DIAGNOSIS — Z872 Personal history of diseases of the skin and subcutaneous tissue: Secondary | ICD-10-CM | POA: Diagnosis not present

## 2014-10-23 DIAGNOSIS — I150 Renovascular hypertension: Secondary | ICD-10-CM | POA: Insufficient documentation

## 2014-10-23 DIAGNOSIS — Z950 Presence of cardiac pacemaker: Secondary | ICD-10-CM | POA: Insufficient documentation

## 2014-10-23 DIAGNOSIS — I480 Paroxysmal atrial fibrillation: Secondary | ICD-10-CM | POA: Insufficient documentation

## 2014-10-23 DIAGNOSIS — I2510 Atherosclerotic heart disease of native coronary artery without angina pectoris: Secondary | ICD-10-CM | POA: Insufficient documentation

## 2014-10-23 DIAGNOSIS — R0789 Other chest pain: Secondary | ICD-10-CM

## 2014-10-23 DIAGNOSIS — E785 Hyperlipidemia, unspecified: Secondary | ICD-10-CM | POA: Diagnosis not present

## 2014-10-23 DIAGNOSIS — Z8673 Personal history of transient ischemic attack (TIA), and cerebral infarction without residual deficits: Secondary | ICD-10-CM | POA: Insufficient documentation

## 2014-10-23 DIAGNOSIS — Z79899 Other long term (current) drug therapy: Secondary | ICD-10-CM | POA: Diagnosis not present

## 2014-10-23 DIAGNOSIS — M1990 Unspecified osteoarthritis, unspecified site: Secondary | ICD-10-CM | POA: Diagnosis not present

## 2014-10-23 DIAGNOSIS — Z9889 Other specified postprocedural states: Secondary | ICD-10-CM | POA: Insufficient documentation

## 2014-10-23 DIAGNOSIS — Z9861 Coronary angioplasty status: Secondary | ICD-10-CM | POA: Insufficient documentation

## 2014-10-23 DIAGNOSIS — Z8781 Personal history of (healed) traumatic fracture: Secondary | ICD-10-CM | POA: Diagnosis not present

## 2014-10-23 LAB — BASIC METABOLIC PANEL
Anion gap: 9 (ref 5–15)
BUN: 16 mg/dL (ref 6–20)
CO2: 29 mmol/L (ref 22–32)
Calcium: 9.3 mg/dL (ref 8.9–10.3)
Chloride: 95 mmol/L — ABNORMAL LOW (ref 101–111)
Creatinine, Ser: 0.88 mg/dL (ref 0.44–1.00)
GFR calc Af Amer: >60 mL/min (ref >60)
GFR, EST NON AFRICAN AMERICAN: 57 mL/min — AB (ref >60)
Glucose, Bld: 107 mg/dL — ABNORMAL HIGH (ref 65–99)
POTASSIUM: 4.1 mmol/L (ref 3.5–5.1)
Sodium: 133 mmol/L — ABNORMAL LOW (ref 135–145)

## 2014-10-23 LAB — CBC
HEMATOCRIT: 43.1 % (ref 36.0–46.0)
Hemoglobin: 14.4 g/dL (ref 12.0–15.0)
MCH: 29.8 pg (ref 26.0–34.0)
MCHC: 33.4 g/dL (ref 30.0–36.0)
MCV: 89.2 fL (ref 78.0–100.0)
PLATELETS: 248 10*3/uL (ref 150–400)
RBC: 4.83 MIL/uL (ref 3.87–5.11)
RDW: 13.2 % (ref 11.5–15.5)
WBC: 7.7 10*3/uL (ref 4.0–10.5)

## 2014-10-23 LAB — TROPONIN I: Troponin I: <0.03 ng/mL (ref <0.031)

## 2014-10-23 LAB — PROTIME-INR
INR: 2.43 — AB (ref 0.00–1.49)
PROTHROMBIN TIME: 26.1 s — AB (ref 11.6–15.2)

## 2014-10-23 NOTE — ED Provider Notes (Signed)
CSN: [id number]     Arrival date & time 10/23/14  1511 History   First MD Initiated Contact with Patient 10/23/14 1606     Chief Complaint  Patient presents with  . Chest Pain     (Consider location/radiation/quality/duration/timing/severity/associated sxs/prior Treatment) HPI Comments: Patient with previous history of coronary artery disease and paroxysmal atrial fibrillation presents to the emergency department for evaluation of chest pain. Patient reports that she has been experiencing intermittent episodes of chest pain for about a week. Patient reports that the symptoms do not last long. When it occurs it usually lasts for a minute or less. It is not a severe pain, she cannot describe it. Initially it was in the left lateral maxillary area, now it is central. She has not had any pain since she came to the ER, but has had several episodes earlier today. There is no associated shortness of breath. No nausea or diaphoresis.  Patient is a 81 y.o. female presenting with chest pain.  Chest Pain   Past Medical History  Diagnosis Date  . Paroxysmal atrial fibrillation   . TIA (transient ischemic attack) 08/2011    OSH: flushing, left LE numbness, CT negative  . CAD (coronary artery disease)     EF 55% by 2 D Echo 2008 and 2012  . Peripheral neuropathy   . Peripheral vascular disease   . Fibromuscular dysplasia     S/P PCI right renal aretery 1999  . Hx of echocardiogram 05/2012     EF >55%, LA 3.9 cm, mild LVH  . LVH (left ventricular hypertrophy)     Mild  . Hx of cardiovascular stress test 09/2010    Nuclear stress testing showing no evidence of ischemia, EF=76%, No EKG changes & no perfusion defects.  . Chronic atrial fibrillation   . Pre-diabetes 01/21/11    Chronic  . Renovascular hypertension   . Hyperlipidemia   . Tic [place] 1994    S/P successful surgery  . Osteoporosis     With Hx of thoracic compression fractures  . Thoracic compression fracture   . DDD  (degenerative disc disease)   . Osteoarthritis   . Endometrial carcinoma   . 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 Chronic fatigue   . Actinic keratosis   . Hip fracture, right 08/06/06    S/P surgery  . Chronic edema 10/15/11  . GERD (gastroesophageal reflux disease)   . Tachy-brady syndrome   . Bilateral leg weakness 09/27/12  . Encounter for long-term (current) use of other medications 09/02/12  . Hypertension     Difficult to control  . Pacemaker    Past Surgical History  Procedure Laterality Date  . Cardiac catheterization  11/05/01    EF 72%. RCA 25%. LCX 50%. Ramus 75/100%. LAD 95%. D2 75%.  . Cholecystectomy  1989  . Vaginal hysterectomy  1990  . Appendectomy  1946  . Lumbar disc surgery    . Renal artery angioplasty Right 1994    Renal artery stenosis secondary to fibromusclar dysplasia  . Renal artery angioplasty Right 09/13/97    PTA/Stent to ostial right renal artery, No distal fibromuscular hyperplasia  . Renal angiogram  09/14/97    25% diffuse left renal artery. 50% ostia; right renal artery.  . Abdominal aortagram  09/05/01    Right & left renals 25%  . Dipyridamole stress test  09/2010    Nuclear stress testing showing no evidence of ischemia, EF=76%, No EKG changes &  no perfusion defects.  . Cesarean section  1959  . Cesarean section  1961  . Spinal fusion  2002    Percutaneous spinal fusion  . Coronary angioplasty    . Total abdominal hysterectomy w/ bilateral salpingoophorectomy  1990  . Coronary angioplasty with stent placement  11/05/01    PTCA/Stent to 95% mid LAD, Ramus lesion could not be crossed and was not treated.    Family History  Problem Relation Age of Onset  . COPD Brother   . Diabetes Brother     DM  . Hypertension Brother   . Stroke Father   . Stroke Other    History  Substance Use Topics  . Smoking status: Never Smoker   . Smokeless tobacco: Never Used  . Alcohol Use: Yes     Comment: Rarely   OB  History    Gravida Para Term Preterm AB TAB SAB Ectopic Multiple Living   2 2 2       2      Review of Systems  Cardiovascular: Positive for chest pain.  All other systems reviewed and are negative.     Allergies  Amiodarone; Ace inhibitors; Meperidine and related; and Tikosyn  Home Medications   Prior to Admission medications   Medication Sig Start Date End Date Taking? Authorizing Provider  aspirin EC 81 MG tablet Take 81 mg by mouth daily.   Yes Historical Provider, MD  calcium carbonate (TUMS) 500 MG chewable tablet Chew 3 tablets by mouth daily.   Yes Historical Provider, MD  cloNIDine (CATAPRES) 0.1 MG tablet Take 0.1-0.2 mg by mouth 2 (two) times daily. Take 1 tablet in the morning and 2 tablets at night 05/07/09  Yes Historical Provider, MD  diltiazem (CARDIZEM CD) 120 MG 24 hr capsule Take 1 capsule by mouth 2 (two) times daily.  08/22/13 10/23/14 Yes Historical Provider, MD  Ergocalciferol (VITAMIN D2) 2000 UNITS TABS Take 1 tablet by mouth daily.    Yes Historical Provider, MD  furosemide (LASIX) 20 MG tablet Take 20 mg by mouth daily as needed for fluid.   Yes Historical Provider, MD  magnesium oxide (MAG-OX) 400 MG tablet Take 400 mg by mouth daily.   Yes Historical Provider, MD  metoprolol tartrate (LOPRESSOR) 25 MG tablet Take 1 tablet by mouth 2 (two) times daily.   Yes Historical Provider, MD  nitroGLYCERIN (NITROSTAT) 0.4 MG SL tablet Place 0.4 mg under the tongue every 5 (five) minutes as needed for chest pain.  04/10/10  Yes Historical Provider, MD  Polyethyl Glycol-Propyl Glycol (SYSTANE) 0.4-0.3 % SOLN Apply 1 drop to eye 4 (four) times daily.   Yes Historical Provider, MD  rosuvastatin (CRESTOR) 5 MG tablet Take 5 mg by mouth every evening.    Yes Historical Provider, MD  valsartan (DIOVAN) 320 MG tablet Take 320 mg by mouth daily. 04/10/10  Yes Historical Provider, MD  warfarin (COUMADIN) 2 MG tablet Take 2-4 mg by mouth See admin instructions. 2mg  on all days except  Mondays and Fridays. Take 5mg  on Mondays and Fridays   Yes Historical Provider, MD   BP 131/92 mmHg  Pulse 76  Temp(Src) 98.2 F (36.8 C) (Oral)  Resp 16  Ht 5' 1.5" (1.562 m)  Wt 123 lb (55.792 kg)  BMI 22.87 kg/m2  SpO2 98% Physical Exam  Constitutional: She is oriented to person, place, and time. She appears well-developed and well-nourished. No distress.  HENT:  Head: Normocephalic and atraumatic.  Right Ear: Hearing normal.  Left Ear:  Hearing normal.  Nose: Nose normal.  Mouth/Throat: Oropharynx is clear and moist and mucous membranes are normal.  Eyes: Conjunctivae and EOM are normal. Pupils are equal, round, and reactive to light.  Neck: Normal range of motion. Neck supple.  Cardiovascular: Regular rhythm, S1 normal and S2 normal.  Exam reveals no gallop and no friction rub.   No murmur heard. Pulmonary/Chest: Effort normal and breath sounds normal. No respiratory distress. She exhibits no tenderness.  Abdominal: Soft. Normal appearance and bowel sounds are normal. There is no hepatosplenomegaly. There is no tenderness. There is no rebound, no guarding, no tenderness at McBurney's point and negative Murphy's sign. No hernia.  Musculoskeletal: Normal range of motion.  Neurological: She is alert and oriented to person, place, and time. She has normal strength. No cranial nerve deficit or sensory deficit. Coordination normal. GCS eye subscore is 4. GCS verbal subscore is 5. GCS motor subscore is 6.  Skin: Skin is warm, dry and intact. No rash noted. No cyanosis.  Psychiatric: She has a normal mood and affect. Her speech is normal and behavior is normal. Thought content normal.  Nursing note and vitals reviewed.   ED Course  Procedures (including critical care time) Labs Review Labs Reviewed  BASIC METABOLIC PANEL - Abnormal; Notable for the following:    Sodium 133 (*)    Chloride 95 (*)    Glucose, Bld 107 (*)    GFR calc non Af Amer 57 (*)    All other components within  normal limits  PROTIME-INR - Abnormal; Notable for the following:    Prothrombin Time 26.1 (*)    INR 2.43 (*)    All other components within normal limits  CBC  TROPONIN I  TROPONIN I    Imaging Review Dg Chest 2 View  10/23/2014   CLINICAL DATA:  Chest pain.  EXAM: CHEST  2 VIEW  COMPARISON:  01/29/2014  FINDINGS: Pacemaker in place. Heart size and pulmonary vascularity are normal and the lungs are clear. No effusions. Tortuosity and calcification of the thoracic aorta. No acute osseous abnormality.  IMPRESSION: No active cardiopulmonary disease.  Aortic atherosclerosis.   Electronically Signed   By: Lorriane Shire M.D.   On: 10/23/2014 15:37     EKG Interpretation   Date/Time:  Monday October 23 2014 15:15:30 EDT Ventricular Rate:  85 PR Interval:    QRS Duration: 156 QT Interval:  432 QTC Calculation: 514 R Axis:   100 Text Interpretation:  VENTRICULAR PACING Confirmed by Betsey Holiday  MD,  CHRISTOPHER [phone]) on 10/23/2014 4:20:36 PM      MDM   Final diagnoses:  None   atypical chest pain  Patient with previous history of atrial fibrillation and coronary artery disease presents to the ER with very atypical chest pain. Patient has been having 1 week of intermittent chest pain. Symptoms last for 1 minute or less at a time and occur randomly. There is no exertional component. She has not had any pain here in the ER. Her workup has been entirely normal. EKG was not helpful, she does have ventricular pacing. She did have 2 troponins, however, both of which were normal. Patient was reassured, can follow-up with her doctor as an outpatient.    Orpah Greek, MD 10/23/14 [phone]

## 2014-10-23 NOTE — Discharge Instructions (Signed)

## 2014-10-23 NOTE — Telephone Encounter (Signed)
Pt called to report CP started over a week ago comes and goes with or without exertion ,Started to the left of chest and is now is the center of the chest.Has NTG but will not use it. She states she is going to drive herself to [facility]

## 2014-10-23 NOTE — ED Notes (Signed)
Patient ambulatory to restroom  ?

## 2014-10-23 NOTE — ED Notes (Signed)
PT c/o left sided intermittent chest pain x1 week with no radiation. PT also c/o productive cough x1-2 weeks with clear phlegm. PT states she called her cardiologist and was told to come to ED for eval.

## 2014-11-01 ENCOUNTER — Encounter: Payer: Self-pay | Admitting: *Deleted

## 2014-12-13 ENCOUNTER — Emergency Department (HOSPITAL_COMMUNITY)
Admission: EM | Admit: 2014-12-13 | Discharge: 2014-12-14 | Disposition: A | Discharge status: Home / Self Care | Payer: Medicare Other | Attending: Emergency Medicine | Admitting: Emergency Medicine

## 2014-12-13 ENCOUNTER — Encounter (HOSPITAL_COMMUNITY): Payer: Self-pay | Admitting: Emergency Medicine

## 2014-12-13 ENCOUNTER — Emergency Department (HOSPITAL_COMMUNITY): Payer: Medicare Other

## 2014-12-13 DIAGNOSIS — Z7901 Long term (current) use of anticoagulants: Secondary | ICD-10-CM | POA: Insufficient documentation

## 2014-12-13 DIAGNOSIS — K219 Gastro-esophageal reflux disease without esophagitis: Secondary | ICD-10-CM | POA: Diagnosis not present

## 2014-12-13 DIAGNOSIS — Z8781 Personal history of (healed) traumatic fracture: Secondary | ICD-10-CM | POA: Insufficient documentation

## 2014-12-13 DIAGNOSIS — Z7982 Long term (current) use of aspirin: Secondary | ICD-10-CM | POA: Diagnosis not present

## 2014-12-13 DIAGNOSIS — M1990 Unspecified osteoarthritis, unspecified site: Secondary | ICD-10-CM | POA: Insufficient documentation

## 2014-12-13 DIAGNOSIS — E785 Hyperlipidemia, unspecified: Secondary | ICD-10-CM | POA: Insufficient documentation

## 2014-12-13 DIAGNOSIS — Z8639 Personal history of other endocrine, nutritional and metabolic disease: Secondary | ICD-10-CM | POA: Diagnosis not present

## 2014-12-13 DIAGNOSIS — Z79899 Other long term (current) drug therapy: Secondary | ICD-10-CM | POA: Diagnosis not present

## 2014-12-13 DIAGNOSIS — Z90710 Acquired absence of both cervix and uterus: Secondary | ICD-10-CM | POA: Diagnosis not present

## 2014-12-13 DIAGNOSIS — Z8673 Personal history of transient ischemic attack (TIA), and cerebral infarction without residual deficits: Secondary | ICD-10-CM | POA: Insufficient documentation

## 2014-12-13 DIAGNOSIS — R109 Unspecified abdominal pain: Secondary | ICD-10-CM

## 2014-12-13 DIAGNOSIS — R1084 Generalized abdominal pain: Secondary | ICD-10-CM | POA: Diagnosis not present

## 2014-12-13 DIAGNOSIS — Z950 Presence of cardiac pacemaker: Secondary | ICD-10-CM | POA: Diagnosis not present

## 2014-12-13 DIAGNOSIS — Z872 Personal history of diseases of the skin and subcutaneous tissue: Secondary | ICD-10-CM | POA: Insufficient documentation

## 2014-12-13 DIAGNOSIS — Z9889 Other specified postprocedural states: Secondary | ICD-10-CM | POA: Insufficient documentation

## 2014-12-13 DIAGNOSIS — Z8542 Personal history of malignant neoplasm of other parts of uterus: Secondary | ICD-10-CM | POA: Insufficient documentation

## 2014-12-13 DIAGNOSIS — I482 Chronic atrial fibrillation: Secondary | ICD-10-CM | POA: Insufficient documentation

## 2014-12-13 DIAGNOSIS — R197 Diarrhea, unspecified: Secondary | ICD-10-CM | POA: Diagnosis not present

## 2014-12-13 DIAGNOSIS — I10 Essential (primary) hypertension: Secondary | ICD-10-CM | POA: Diagnosis not present

## 2014-12-13 DIAGNOSIS — Z9049 Acquired absence of other specified parts of digestive tract: Secondary | ICD-10-CM | POA: Insufficient documentation

## 2014-12-13 DIAGNOSIS — R110 Nausea: Secondary | ICD-10-CM | POA: Diagnosis not present

## 2014-12-13 DIAGNOSIS — I2510 Atherosclerotic heart disease of native coronary artery without angina pectoris: Secondary | ICD-10-CM | POA: Diagnosis not present

## 2014-12-13 LAB — CBC WITH DIFFERENTIAL/PLATELET
BASOS ABS: 0.1 10*3/uL (ref 0.0–0.1)
BASOS PCT: 0 % (ref 0–1)
EOS ABS: 0.2 10*3/uL (ref 0.0–0.7)
EOS PCT: 1 % (ref 0–5)
HCT: 43.8 % (ref 36.0–46.0)
HEMOGLOBIN: 14.7 g/dL (ref 12.0–15.0)
LYMPHS ABS: 1.2 10*3/uL (ref 0.7–4.0)
Lymphocytes Relative: 7 % — ABNORMAL LOW (ref 12–46)
MCH: 30.3 pg (ref 26.0–34.0)
MCHC: 33.6 g/dL (ref 30.0–36.0)
MCV: 90.3 fL (ref 78.0–100.0)
Monocytes Absolute: 1.3 10*3/uL — ABNORMAL HIGH (ref 0.1–1.0)
Monocytes Relative: 7 % (ref 3–12)
NEUTROS PCT: 85 % — AB (ref 43–77)
Neutro Abs: 16.3 10*3/uL — ABNORMAL HIGH (ref 1.7–7.7)
PLATELETS: 275 10*3/uL (ref 150–400)
RBC: 4.85 MIL/uL (ref 3.87–5.11)
RDW: 13.3 % (ref 11.5–15.5)
WBC MORPHOLOGY: INCREASED
WBC: 19.1 10*3/uL — AB (ref 4.0–10.5)

## 2014-12-13 LAB — COMPREHENSIVE METABOLIC PANEL
ALBUMIN: 3.9 g/dL (ref 3.5–5.0)
ALT: 20 U/L (ref 14–54)
ANION GAP: 10 (ref 5–15)
AST: 26 U/L (ref 15–41)
Alkaline Phosphatase: 103 U/L (ref 38–126)
BILIRUBIN TOTAL: 0.7 mg/dL (ref 0.3–1.2)
BUN: 20 mg/dL (ref 6–20)
CO2: 21 mmol/L — ABNORMAL LOW (ref 22–32)
Calcium: 9.3 mg/dL (ref 8.9–10.3)
Chloride: 97 mmol/L — ABNORMAL LOW (ref 101–111)
Creatinine, Ser: 0.89 mg/dL (ref 0.44–1.00)
GFR calc non Af Amer: 56 mL/min — ABNORMAL LOW (ref >60)
GLUCOSE: 131 mg/dL — AB (ref 65–99)
POTASSIUM: 3.3 mmol/L — AB (ref 3.5–5.1)
SODIUM: 128 mmol/L — AB (ref 135–145)
TOTAL PROTEIN: 7.1 g/dL (ref 6.5–8.1)

## 2014-12-13 LAB — LACTIC ACID, PLASMA: LACTIC ACID, VENOUS: 1.2 mmol/L (ref 0.5–2.0)

## 2014-12-13 LAB — URINALYSIS, ROUTINE W REFLEX MICROSCOPIC
BILIRUBIN URINE: NEGATIVE
Glucose, UA: NEGATIVE mg/dL
HGB URINE DIPSTICK: NEGATIVE
KETONES UR: NEGATIVE mg/dL
NITRITE: NEGATIVE
PH: 7 (ref 5.0–8.0)
Protein, ur: NEGATIVE mg/dL
Specific Gravity, Urine: 1.015 (ref 1.005–1.030)
UROBILINOGEN UA: 0.2 mg/dL (ref 0.0–1.0)

## 2014-12-13 LAB — URINE MICROSCOPIC-ADD ON

## 2014-12-13 LAB — LIPASE, BLOOD: Lipase: 41 U/L (ref 22–51)

## 2014-12-13 LAB — PROTIME-INR
INR: 2.07 — ABNORMAL HIGH (ref 0.00–1.49)
Prothrombin Time: 23.2 seconds — ABNORMAL HIGH (ref 11.6–15.2)

## 2014-12-13 MED ORDER — ALUM & MAG HYDROXIDE-SIMETH 200-200-20 MG/5ML PO SUSP
30.0000 mL | Freq: Once | ORAL | Status: AC
  Administered 2014-12-13: 30 mL via ORAL
  Filled 2014-12-13: qty 30

## 2014-12-13 MED ORDER — MORPHINE SULFATE (PF) 2 MG/ML IV SOLN: 2.0000 mg | INTRAVENOUS | Status: DC | PRN

## 2014-12-13 MED ORDER — IOHEXOL 300 MG/ML  SOLN
50.0000 mL | Freq: Once | INTRAMUSCULAR | Status: AC | PRN
  Administered 2014-12-13: 50 mL via ORAL

## 2014-12-13 MED ORDER — IOHEXOL 300 MG/ML  SOLN
100.0000 mL | Freq: Once | INTRAMUSCULAR | Status: AC | PRN
  Administered 2014-12-13: 100 mL via INTRAVENOUS

## 2014-12-13 MED ORDER — IOHEXOL 300 MG/ML  SOLN: 50.0000 mL | Freq: Once | INTRAMUSCULAR | Status: DC | PRN

## 2014-12-13 MED ORDER — SODIUM CHLORIDE 0.9 % IV SOLN
INTRAVENOUS | Status: DC
  Administered 2014-12-13: 20:00:00 via INTRAVENOUS

## 2014-12-13 MED ORDER — ONDANSETRON HCL 4 MG/2ML IJ SOLN
4.0000 mg | INTRAMUSCULAR | Status: DC | PRN
  Administered 2014-12-13: 4 mg via INTRAVENOUS
  Filled 2014-12-13: qty 2

## 2014-12-13 NOTE — ED Notes (Signed)
Patient reports abdominal pains that come and go since 1400 today. Reports similar symptoms before when she had a blockage. Also reports she believes she is constipated and cannot remember the last time she had a bowel movement. Reports nausea, denies vomiting.

## 2014-12-13 NOTE — ED Notes (Signed)
Ambulatory to restroom at this time without difficulty.

## 2014-12-13 NOTE — ED Provider Notes (Signed)
CSN: [id number]     Arrival date & time 12/13/14  1917 History   First MD Initiated Contact with Patient 12/13/14 1936     Chief Complaint  Patient presents with  . Abdominal Pain      HPI Pt was seen at [facility].  Per pt, c/o gradual onset and persistence of waxing and waning generalized abd "pain" since 1400 today.  Has been associated with nausea.  Describes the abd pain as "cramping."  Pt is concerned she "has another bowel blockage." Denies vomiting/diarrhea, no fevers, no back pain, no rash, no CP/SOB, no black or blood in stools, no dysuria/hematuria.       Past Medical History  Diagnosis Date  . Paroxysmal atrial fibrillation   . TIA (transient ischemic attack) 08/2011    OSH: flushing, left LE numbness, CT negative  . CAD (coronary artery disease)     EF 55% by 2 D Echo 2008 and 2012  . Peripheral neuropathy   . Peripheral vascular disease   . Fibromuscular dysplasia     S/P PCI right renal aretery 1999  . Hx of echocardiogram 05/2012     EF >55%, LA 3.9 cm, mild LVH  . LVH (left ventricular hypertrophy)     Mild  . Hx of cardiovascular stress test 09/2010    Nuclear stress testing showing no evidence of ischemia, EF=76%, No EKG changes & no perfusion defects.  . Chronic atrial fibrillation   . Pre-diabetes 01/21/11    Chronic  . Renovascular hypertension   . Hyperlipidemia   . Tic douloureux 1994    S/P successful surgery  . Osteoporosis     With Hx of thoracic compression fractures  . Thoracic compression fracture   . DDD (degenerative disc disease)   . Osteoarthritis   . Endometrial carcinoma   . 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 Chronic fatigue   . Actinic keratosis   . Hip fracture, right 08/06/06    S/P surgery  . Chronic edema 10/15/11  . GERD (gastroesophageal reflux disease)   . Tachy-brady syndrome   . Bilateral leg weakness 09/27/12  . Encounter for long-term (current) use of other medications 09/02/12  . Hypertension      Difficult to control  . Pacemaker    Past Surgical History  Procedure Laterality Date  . Cardiac catheterization  11/05/01    EF 72%. RCA 25%. LCX 50%. Ramus 75/100%. LAD 95%. D2 75%.  . Cholecystectomy  1989  . Vaginal hysterectomy  1990  . Appendectomy  1946  . Lumbar disc surgery    . Renal artery angioplasty Right 1994    Renal artery stenosis secondary to fibromusclar dysplasia  . Renal artery angioplasty Right 09/13/97    PTA/Stent to ostial right renal artery, No distal fibromuscular hyperplasia  . Renal angiogram  09/14/97    25% diffuse left renal artery. 50% ostia; right renal artery.  . Abdominal aortagram  09/05/01    Right & left renals 25%  . Dipyridamole stress test  09/2010    Nuclear stress testing showing no evidence of ischemia, EF=76%, No EKG changes & no perfusion defects.  . Cesarean section  1959  . Cesarean section  1961  . Spinal fusion  2002    Percutaneous spinal fusion  . Coronary angioplasty    . Total abdominal hysterectomy w/ bilateral salpingoophorectomy  1990  . Coronary angioplasty with stent placement  11/05/01    PTCA/Stent to 95% mid LAD, Ramus  lesion could not be crossed and was not treated.    Family History  Problem Relation Age of Onset  . COPD Brother   . Diabetes Brother     DM  . Hypertension Brother   . Stroke Father   . Stroke Other    Social History  Substance Use Topics  . Smoking status: Never Smoker   . Smokeless tobacco: Never Used  . Alcohol Use: Yes     Comment: Rarely   OB History    Gravida Para Term Preterm AB TAB SAB Ectopic Multiple Living   2 2 2       2      Review of Systems ROS: Statement: All systems negative except as marked or noted in the HPI; Constitutional: Negative for fever and chills. ; ; Eyes: Negative for eye pain, redness and discharge. ; ; ENMT: Negative for ear pain, hoarseness, nasal congestion, sinus pressure and sore throat. ; ; Cardiovascular: Negative for chest pain, palpitations,  diaphoresis, dyspnea and peripheral edema. ; ; Respiratory: Negative for cough, wheezing and stridor. ; ; Gastrointestinal: +abd pain, nausea. Negative for vomiting, diarrhea, blood in stool, hematemesis, jaundice and rectal bleeding. . ; ; Genitourinary: Negative for dysuria, flank pain and hematuria. ; ; Musculoskeletal: Negative for back pain and neck pain. Negative for swelling and trauma.; ; Skin: Negative for pruritus, rash, abrasions, blisters, bruising and skin lesion.; ; Neuro: Negative for headache, lightheadedness and neck stiffness. Negative for weakness, altered level of consciousness , altered mental status, extremity weakness, paresthesias, involuntary movement, seizure and syncope.     Allergies  Amiodarone; Ace inhibitors; Meperidine and related; and Tikosyn  Home Medications   Prior to Admission medications   Medication Sig Start Date End Date Taking? Authorizing Provider  aspirin EC 81 MG tablet Take 81 mg by mouth daily.    Historical Provider, MD  calcium carbonate (TUMS) 500 MG chewable tablet Chew 3 tablets by mouth daily.    Historical Provider, MD  cloNIDine (CATAPRES) 0.1 MG tablet Take 0.1-0.2 mg by mouth 2 (two) times daily. Take 1 tablet in the morning and 2 tablets at night 05/07/09   Historical Provider, MD  diltiazem (CARDIZEM CD) 120 MG 24 hr capsule Take 1 capsule by mouth 2 (two) times daily.  08/22/13 10/23/14  Historical Provider, MD  Ergocalciferol (VITAMIN D2) 2000 UNITS TABS Take 1 tablet by mouth daily.     Historical Provider, MD  furosemide (LASIX) 20 MG tablet Take 20 mg by mouth daily as needed for fluid.    Historical Provider, MD  magnesium oxide (MAG-OX) 400 MG tablet Take 400 mg by mouth daily.    Historical Provider, MD  metoprolol tartrate (LOPRESSOR) 25 MG tablet Take 1 tablet by mouth 2 (two) times daily.    Historical Provider, MD  nitroGLYCERIN (NITROSTAT) 0.4 MG SL tablet Place 0.4 mg under the tongue every 5 (five) minutes as needed for chest  pain.  04/10/10   Historical Provider, MD  Polyethyl Glycol-Propyl Glycol (SYSTANE) 0.4-0.3 % SOLN Apply 1 drop to eye 4 (four) times daily.    Historical Provider, MD  rosuvastatin (CRESTOR) 5 MG tablet Take 5 mg by mouth every evening.     Historical Provider, MD  valsartan (DIOVAN) 320 MG tablet Take 320 mg by mouth daily. 04/10/10   Historical Provider, MD  warfarin (COUMADIN) 2 MG tablet Take 2-4 mg by mouth See admin instructions. 4mg  on all days except Mondays,Wed. Fridays. Take 2mg  all other days  Historical Provider, MD   BP 133/81 mmHg  Pulse 76  Temp(Src) 97.7 F (36.5 C) (Oral)  Resp 16  Ht 5' 1.5" (1.562 m)  Wt 123 lb (55.792 kg)  BMI 22.87 kg/m2  SpO2 96% Physical Exam  1950: Physical examination:  Nursing notes reviewed; Vital signs and O2 SAT reviewed;  Constitutional: Well developed, Well nourished, Well hydrated, In no acute distress; Head:  Normocephalic, atraumatic; Eyes: EOMI, PERRL, No scleral icterus; ENMT: Mouth and pharynx normal, Mucous membranes moist; Neck: Supple, Full range of motion, No lymphadenopathy; Cardiovascular: Regular rate and rhythm, No gallop; Respiratory: Breath sounds clear & equal bilaterally, No wheezes.  Speaking full sentences with ease, Normal respiratory effort/excursion; Chest: Nontender, Movement normal; Abdomen: Soft, +mild diffuse tenderness to palp. No rebound or guarding. Nondistended, Normal bowel sounds; Genitourinary: No CVA tenderness; Extremities: Pulses normal, No tenderness, No edema, No calf edema or asymmetry.; Neuro: AA&Ox3, Major CN grossly intact.  Speech clear. No gross focal motor or sensory deficits in extremities.; Skin: Color normal, Warm, Dry.   ED Course  Procedures (including critical care time) Labs Review   Imaging Review  I have personally reviewed and evaluated these images and lab results as part of my medical decision-making.   EKG Interpretation None      MDM  MDM Reviewed: previous chart, nursing  note and vitals Reviewed previous: labs Interpretation: labs and x-ray      Results for orders placed or performed during the hospital encounter of 12/13/14  Urinalysis, Routine w reflex microscopic (not at [facility])  Result Value Ref Range   Color, Urine YELLOW YELLOW   APPearance CLEAR CLEAR   Specific Gravity, Urine 1.015 1.005 - 1.030   pH 7.0 5.0 - 8.0   Glucose, UA NEGATIVE NEGATIVE mg/dL   Hgb urine dipstick NEGATIVE NEGATIVE   Bilirubin Urine NEGATIVE NEGATIVE   Ketones, ur NEGATIVE NEGATIVE mg/dL   Protein, ur NEGATIVE NEGATIVE mg/dL   Urobilinogen, UA 0.2 0.0 - 1.0 mg/dL   Nitrite NEGATIVE NEGATIVE   Leukocytes, UA SMALL (A) NEGATIVE  Comprehensive metabolic panel  Result Value Ref Range   Sodium 128 (L) 135 - 145 mmol/L   Potassium 3.3 (L) 3.5 - 5.1 mmol/L   Chloride 97 (L) 101 - 111 mmol/L   CO2 21 (L) 22 - 32 mmol/L   Glucose, Bld 131 (H) 65 - 99 mg/dL   BUN 20 6 - 20 mg/dL   Creatinine, Ser 0.89 0.44 - 1.00 mg/dL   Calcium 9.3 8.9 - 10.3 mg/dL   Total Protein 7.1 6.5 - 8.1 g/dL   Albumin 3.9 3.5 - 5.0 g/dL   AST 26 15 - 41 U/L   ALT 20 14 - 54 U/L   Alkaline Phosphatase 103 38 - 126 U/L   Total Bilirubin 0.7 0.3 - 1.2 mg/dL   GFR calc non Af Amer 56 (L) >60 mL/min   GFR calc Af Amer >60 >60 mL/min   Anion gap 10 5 - 15  Lipase, blood  Result Value Ref Range   Lipase 41 22 - 51 U/L  Lactic acid, plasma  Result Value Ref Range   Lactic Acid, Venous 1.2 0.5 - 2.0 mmol/L  CBC with Differential  Result Value Ref Range   WBC 19.1 (H) 4.0 - 10.5 K/uL   RBC 4.85 3.87 - 5.11 MIL/uL   Hemoglobin 14.7 12.0 - 15.0 g/dL   HCT 43.8 36.0 - 46.0 %   MCV 90.3 78.0 - 100.0 fL   MCH 30.3  26.0 - 34.0 pg   MCHC 33.6 30.0 - 36.0 g/dL   RDW 13.3 11.5 - 15.5 %   Platelets 275 150 - 400 K/uL   Neutrophils Relative % 85 (H) 43 - 77 %   Neutro Abs 16.3 (H) 1.7 - 7.7 K/uL   Lymphocytes Relative 7 (L) 12 - 46 %   Lymphs Abs 1.2 0.7 - 4.0 K/uL   Monocytes Relative 7 3 - 12 %    Monocytes Absolute 1.3 (H) 0.1 - 1.0 K/uL   Eosinophils Relative 1 0 - 5 %   Eosinophils Absolute 0.2 0.0 - 0.7 K/uL   Basophils Relative 0 0 - 1 %   Basophils Absolute 0.1 0.0 - 0.1 K/uL   WBC Morphology INCREASED BANDS (>20% BANDS)    RBC Morphology TEARDROP CELLS    Smear Review LARGE PLATELETS PRESENT   Urine microscopic-add on  Result Value Ref Range   Squamous Epithelial / LPF RARE RARE   WBC, UA 3-6 <3 WBC/hpf   Bacteria, UA FEW (A) RARE   Casts GRANULAR CAST (A) NEGATIVE   Dg Chest 2 View 12/13/2014   CLINICAL DATA:  81 year old female with nausea and abdominal pain  EXAM: CHEST  2 VIEW  COMPARISON:  None.  FINDINGS: The heart size and mediastinal contours are within normal limits. Left pectoral pacemaker device. Both lungs are clear. The visualized skeletal structures are unremarkable.  IMPRESSION: No active cardiopulmonary disease.   Electronically Signed   By: Anner Crete M.D.   On: 12/13/2014 20:37    2200:  INR and CT A/P pending. Dispo based on results. Sign out to Dr. Eulis Foster.   Francine Graven, DO 12/13/14 2201

## 2014-12-13 NOTE — ED Notes (Signed)
Assist patiient batroon

## 2014-12-14 NOTE — Discharge Instructions (Signed)
Follow-up with your primary care doctor for a checkup, and repeat CBC, in 2 days. Drink plenty of fluids and gradually advance your diet.    Abdominal Pain Many things can cause abdominal pain. Usually, abdominal pain is not caused by a disease and will improve without treatment. It can often be observed and treated at home. Your health care provider will do a physical exam and possibly order blood tests and X-rays to help determine the seriousness of your pain. However, in many cases, more time must pass before a clear cause of the pain can be found. Before that point, your health care provider may not know if you need more testing or further treatment. HOME CARE INSTRUCTIONS  Monitor your abdominal pain for any changes. The following actions may help to alleviate any discomfort you are experiencing:  Only take over-the-counter or prescription medicines as directed by your health care provider.  Do not take laxatives unless directed to do so by your health care provider.  Try a clear liquid diet (broth, tea, or water) as directed by your health care provider. Slowly move to a bland diet as tolerated. SEEK MEDICAL CARE IF:  You have unexplained abdominal pain.  You have abdominal pain associated with nausea or diarrhea.  You have pain when you urinate or have a bowel movement.  You experience abdominal pain that wakes you in the night.  You have abdominal pain that is worsened or improved by eating food.  You have abdominal pain that is worsened with eating fatty foods.  You have a fever. SEEK IMMEDIATE MEDICAL CARE IF:   Your pain does not go away within 2 hours.  You keep throwing up (vomiting).  Your pain is felt only in portions of the abdomen, such as the right side or the left lower portion of the abdomen.  You pass bloody or black tarry stools. MAKE SURE YOU:  Understand these instructions.   Will watch your condition.   Will get help right away if you are not  doing well or get worse.  Document Released: 01/01/2005 Document Revised: 03/29/2013 Document Reviewed: 12/01/2012 [facility] Patient Information 2015 [place], [place]. This information is not intended to replace advice given to you by your health care provider. Make sure you discuss any questions you have with your health care provider.  Diarrhea Diarrhea is frequent loose and watery bowel movements. It can cause you to feel weak and dehydrated. Dehydration can cause you to become tired and thirsty, have a dry mouth, and have decreased urination that often is dark yellow. Diarrhea is a sign of another problem, most often an infection that will not last long. In most cases, diarrhea typically lasts 2-3 days. However, it can last longer if it is a sign of something more serious. It is important to treat your diarrhea as directed by your caregiver to lessen or prevent future episodes of diarrhea. CAUSES  Some common causes include:  Gastrointestinal infections caused by viruses, bacteria, or parasites.  Food poisoning or food allergies.  Certain medicines, such as antibiotics, chemotherapy, and laxatives.  Artificial sweeteners and fructose.  Digestive disorders. HOME CARE INSTRUCTIONS  Ensure adequate fluid intake (hydration): Have 1 cup (8 oz) of fluid for each diarrhea episode. Avoid fluids that contain simple sugars or sports drinks, fruit juices, whole milk products, and sodas. Your urine should be clear or pale yellow if you are drinking enough fluids. Hydrate with an oral rehydration solution that you can purchase at pharmacies, retail stores, and online.  You can prepare an oral rehydration solution at home by mixing the following ingredients together:   - tsp table salt.   tsp baking soda.   tsp salt substitute containing potassium chloride.  1  tablespoons sugar.  1 L (34 oz) of water.  Certain foods and beverages may increase the speed at which food moves through the  gastrointestinal (GI) tract. These foods and beverages should be avoided and include:  Caffeinated and alcoholic beverages.  High-fiber foods, such as raw fruits and vegetables, nuts, seeds, and whole grain breads and cereals.  Foods and beverages sweetened with sugar alcohols, such as xylitol, sorbitol, and mannitol.  Some foods may be well tolerated and may help thicken stool including:  Starchy foods, such as rice, toast, pasta, low-sugar cereal, oatmeal, grits, baked potatoes, crackers, and bagels.  Bananas.  Applesauce.  Add probiotic-rich foods to help increase healthy bacteria in the GI tract, such as yogurt and fermented milk products.  Wash your hands well after each diarrhea episode.  Only take over-the-counter or prescription medicines as directed by your caregiver.  Take a warm bath to relieve any burning or pain from frequent diarrhea episodes. SEEK IMMEDIATE MEDICAL CARE IF:   You are unable to keep fluids down.  You have persistent vomiting.  You have blood in your stool, or your stools are black and tarry.  You do not urinate in 6-8 hours, or there is only a small amount of very dark urine.  You have abdominal pain that increases or localizes.  You have weakness, dizziness, confusion, or light-headedness.  You have a severe headache.  Your diarrhea gets worse or does not get better.  You have a fever or persistent symptoms for more than 2-3 days.  You have a fever and your symptoms suddenly get worse. MAKE SURE YOU:   Understand these instructions.  Will watch your condition.  Will get help right away if you are not doing well or get worse. Document Released: 03/14/2002 Document Revised: 08/08/2013 Document Reviewed: 11/30/2011 [employer] Patient Information 2015 [place], [place]. This information is not intended to replace advice given to you by your health care provider. Make sure you discuss any questions you have with your health care provider.

## 2014-12-14 NOTE — ED Provider Notes (Addendum)
12:00- since having the CT scan. She has had numerous episodes of rare, and her abdominal pain is improved. Vital signs are repeated and are reassuring. Findings discussed with patient, all questions were answered.  Medical decision making- abdominal pain likely secondary to transient bowel blockage, improved after CT imaging with oral contrast. Diarrhea is likely related to the transitional change after relief of blockage. There is no evidence for serious bacterial infection. Metabolic instability or suggestion for impending vascular collapse.  Plan- discharge with symptomatic treatment of fluids , Tylenol and he gradually advance diet. Recommended PCP follow-up in one week.  Daleen Bo, MD 12/14/14 [phone]

## 2014-12-16 LAB — URINE CULTURE

## 2015-01-17 ENCOUNTER — Ambulatory Visit (INDEPENDENT_AMBULATORY_CARE_PROVIDER_SITE_OTHER): Payer: Medicare Other | Admitting: *Deleted

## 2015-01-17 DIAGNOSIS — I4891 Unspecified atrial fibrillation: Secondary | ICD-10-CM | POA: Diagnosis not present

## 2015-01-17 NOTE — Progress Notes (Signed)
Remote pacemaker transmission.   

## 2015-01-18 ENCOUNTER — Observation Stay (HOSPITAL_COMMUNITY)
Admission: EM | Admit: 2015-01-18 | Discharge: 2015-01-20 | Disposition: A | Discharge status: Home / Self Care | Payer: Medicare Other | Attending: Internal Medicine | Admitting: Internal Medicine

## 2015-01-18 ENCOUNTER — Observation Stay (HOSPITAL_COMMUNITY): Payer: Medicare Other

## 2015-01-18 ENCOUNTER — Encounter (HOSPITAL_COMMUNITY): Payer: Self-pay | Admitting: *Deleted

## 2015-01-18 ENCOUNTER — Emergency Department (HOSPITAL_COMMUNITY): Payer: Medicare Other

## 2015-01-18 DIAGNOSIS — G500 Trigeminal neuralgia: Secondary | ICD-10-CM | POA: Diagnosis not present

## 2015-01-18 DIAGNOSIS — K219 Gastro-esophageal reflux disease without esophagitis: Secondary | ICD-10-CM | POA: Diagnosis not present

## 2015-01-18 DIAGNOSIS — I160 Hypertensive urgency: Principal | ICD-10-CM | POA: Diagnosis present

## 2015-01-18 DIAGNOSIS — R109 Unspecified abdominal pain: Secondary | ICD-10-CM

## 2015-01-18 DIAGNOSIS — I482 Chronic atrial fibrillation, unspecified: Secondary | ICD-10-CM

## 2015-01-18 DIAGNOSIS — G629 Polyneuropathy, unspecified: Secondary | ICD-10-CM | POA: Insufficient documentation

## 2015-01-18 DIAGNOSIS — I495 Sick sinus syndrome: Secondary | ICD-10-CM

## 2015-01-18 DIAGNOSIS — M810 Age-related osteoporosis without current pathological fracture: Secondary | ICD-10-CM | POA: Insufficient documentation

## 2015-01-18 DIAGNOSIS — E871 Hypo-osmolality and hyponatremia: Secondary | ICD-10-CM | POA: Insufficient documentation

## 2015-01-18 DIAGNOSIS — I10 Essential (primary) hypertension: Secondary | ICD-10-CM | POA: Diagnosis present

## 2015-01-18 DIAGNOSIS — Z8673 Personal history of transient ischemic attack (TIA), and cerebral infarction without residual deficits: Secondary | ICD-10-CM | POA: Insufficient documentation

## 2015-01-18 DIAGNOSIS — Z872 Personal history of diseases of the skin and subcutaneous tissue: Secondary | ICD-10-CM | POA: Insufficient documentation

## 2015-01-18 DIAGNOSIS — I2510 Atherosclerotic heart disease of native coronary artery without angina pectoris: Secondary | ICD-10-CM | POA: Insufficient documentation

## 2015-01-18 DIAGNOSIS — Z7982 Long term (current) use of aspirin: Secondary | ICD-10-CM | POA: Diagnosis not present

## 2015-01-18 DIAGNOSIS — Z7901 Long term (current) use of anticoagulants: Secondary | ICD-10-CM | POA: Insufficient documentation

## 2015-01-18 DIAGNOSIS — R5381 Other malaise: Secondary | ICD-10-CM | POA: Diagnosis not present

## 2015-01-18 DIAGNOSIS — R260 Ataxic gait: Secondary | ICD-10-CM | POA: Diagnosis not present

## 2015-01-18 DIAGNOSIS — M1990 Unspecified osteoarthritis, unspecified site: Secondary | ICD-10-CM | POA: Insufficient documentation

## 2015-01-18 DIAGNOSIS — R609 Edema, unspecified: Secondary | ICD-10-CM

## 2015-01-18 DIAGNOSIS — I480 Paroxysmal atrial fibrillation: Secondary | ICD-10-CM | POA: Insufficient documentation

## 2015-01-18 DIAGNOSIS — R270 Ataxia, unspecified: Secondary | ICD-10-CM | POA: Diagnosis not present

## 2015-01-18 DIAGNOSIS — I739 Peripheral vascular disease, unspecified: Secondary | ICD-10-CM | POA: Diagnosis not present

## 2015-01-18 DIAGNOSIS — E785 Hyperlipidemia, unspecified: Secondary | ICD-10-CM

## 2015-01-18 DIAGNOSIS — Z79899 Other long term (current) drug therapy: Secondary | ICD-10-CM | POA: Insufficient documentation

## 2015-01-18 LAB — CUP PACEART REMOTE DEVICE CHECK
Implantable Lead Serial Number: 29601447
Lead Channel Pacing Threshold Amplitude: 0.6 V
Lead Channel Sensing Intrinsic Amplitude: 12.5 mV
Lead Channel Setting Pacing Amplitude: 1.1 V
Lead Channel Setting Pacing Pulse Width: 0.4 ms
Lead Channel Setting Sensing Sensitivity: 2.5 mV
MDC IDC LEAD IMPLANT DT: 20150413
MDC IDC LEAD LOCATION: 753860
MDC IDC MSMT LEADCHNL RV IMPEDANCE VALUE: 644 Ohm
MDC IDC MSMT LEADCHNL RV PACING THRESHOLD PULSEWIDTH: 0.4 ms
MDC IDC SESS DTM: 20161013182445
MDC IDC STAT BRADY RV PERCENT PACED: 86 %
Pulse Gen Serial Number: 68133668

## 2015-01-18 LAB — DIFFERENTIAL
BASOS ABS: 0.1 10*3/uL (ref 0.0–0.1)
BASOS PCT: 1 %
Eosinophils Absolute: 0.3 10*3/uL (ref 0.0–0.7)
Eosinophils Relative: 2 %
Lymphocytes Relative: 19 %
Lymphs Abs: 2.2 10*3/uL (ref 0.7–4.0)
MONOS PCT: 11 %
Monocytes Absolute: 1.3 10*3/uL — ABNORMAL HIGH (ref 0.1–1.0)
NEUTROS ABS: 7.9 10*3/uL — AB (ref 1.7–7.7)
Neutrophils Relative %: 67 %

## 2015-01-18 LAB — APTT: APTT: 33 s (ref 24–37)

## 2015-01-18 LAB — COMPREHENSIVE METABOLIC PANEL
ALT: 19 U/L (ref 14–54)
AST: 28 U/L (ref 15–41)
Albumin: 4.4 g/dL (ref 3.5–5.0)
Alkaline Phosphatase: 93 U/L (ref 38–126)
Anion gap: 8 (ref 5–15)
BUN: 14 mg/dL (ref 6–20)
CHLORIDE: 96 mmol/L — AB (ref 101–111)
CO2: 26 mmol/L (ref 22–32)
CREATININE: 0.8 mg/dL (ref 0.44–1.00)
Calcium: 9.6 mg/dL (ref 8.9–10.3)
GFR calc Af Amer: >60 mL/min (ref >60)
GFR calc non Af Amer: >60 mL/min (ref >60)
Glucose, Bld: 109 mg/dL — ABNORMAL HIGH (ref 65–99)
Potassium: 3.7 mmol/L (ref 3.5–5.1)
SODIUM: 130 mmol/L — AB (ref 135–145)
Total Bilirubin: 1.1 mg/dL (ref 0.3–1.2)
Total Protein: 8.1 g/dL (ref 6.5–8.1)

## 2015-01-18 LAB — I-STAT CHEM 8, ED
BUN: 14 mg/dL (ref 6–20)
CHLORIDE: 94 mmol/L — AB (ref 101–111)
CREATININE: 0.8 mg/dL (ref 0.44–1.00)
Calcium, Ion: 1.16 mmol/L (ref 1.13–1.30)
GLUCOSE: 109 mg/dL — AB (ref 65–99)
HCT: 53 % — ABNORMAL HIGH (ref 36.0–46.0)
Hemoglobin: 18 g/dL — ABNORMAL HIGH (ref 12.0–15.0)
POTASSIUM: 3.6 mmol/L (ref 3.5–5.1)
Sodium: 132 mmol/L — ABNORMAL LOW (ref 135–145)
TCO2: 23 mmol/L (ref 0–100)

## 2015-01-18 LAB — URINALYSIS, ROUTINE W REFLEX MICROSCOPIC
Bilirubin Urine: NEGATIVE
GLUCOSE, UA: NEGATIVE mg/dL
HGB URINE DIPSTICK: NEGATIVE
KETONES UR: NEGATIVE mg/dL
Nitrite: NEGATIVE
PROTEIN: 30 mg/dL — AB
Specific Gravity, Urine: 1.02 (ref 1.005–1.030)
Urobilinogen, UA: 0.2 mg/dL (ref 0.0–1.0)
pH: 6 (ref 5.0–8.0)

## 2015-01-18 LAB — CBC
HEMATOCRIT: 47.2 % — AB (ref 36.0–46.0)
Hemoglobin: 16.5 g/dL — ABNORMAL HIGH (ref 12.0–15.0)
MCH: 31.3 pg (ref 26.0–34.0)
MCHC: 35 g/dL (ref 30.0–36.0)
MCV: 89.6 fL (ref 78.0–100.0)
Platelets: 258 10*3/uL (ref 150–400)
RBC: 5.27 MIL/uL — AB (ref 3.87–5.11)
RDW: 13.3 % (ref 11.5–15.5)
WBC: 11.8 10*3/uL — AB (ref 4.0–10.5)

## 2015-01-18 LAB — LIPASE, BLOOD: Lipase: 51 U/L (ref 22–51)

## 2015-01-18 LAB — URINE MICROSCOPIC-ADD ON

## 2015-01-18 LAB — RAPID URINE DRUG SCREEN, HOSP PERFORMED
AMPHETAMINES: NOT DETECTED
BARBITURATES: NOT DETECTED
BENZODIAZEPINES: NOT DETECTED
Cocaine: NOT DETECTED
Opiates: NOT DETECTED
Tetrahydrocannabinol: NOT DETECTED

## 2015-01-18 LAB — ETHANOL

## 2015-01-18 LAB — PROTIME-INR
INR: 2.08 — ABNORMAL HIGH (ref 0.00–1.49)
Prothrombin Time: 23.2 seconds — ABNORMAL HIGH (ref 11.6–15.2)

## 2015-01-18 LAB — I-STAT TROPONIN, ED: Troponin i, poc: 0 ng/mL (ref 0.00–0.08)

## 2015-01-18 MED ORDER — SODIUM CHLORIDE 0.9 % IV SOLN
INTRAVENOUS | Status: DC
  Administered 2015-01-18: 23:00:00 via INTRAVENOUS
  Administered 2015-01-19: 1000 mL via INTRAVENOUS

## 2015-01-18 MED ORDER — ONDANSETRON HCL 4 MG/2ML IJ SOLN: 4.0000 mg | Freq: Four times a day (QID) | INTRAMUSCULAR | Status: DC | PRN

## 2015-01-18 MED ORDER — ACETAMINOPHEN 650 MG RE SUPP: 650.0000 mg | Freq: Four times a day (QID) | RECTAL | Status: DC | PRN

## 2015-01-18 MED ORDER — ONDANSETRON HCL 4 MG PO TABS: 4.0000 mg | ORAL_TABLET | Freq: Four times a day (QID) | ORAL | Status: DC | PRN

## 2015-01-18 MED ORDER — GI COCKTAIL ~~LOC~~
30.0000 mL | Freq: Three times a day (TID) | ORAL | Status: DC | PRN
  Administered 2015-01-18: 30 mL via ORAL
  Filled 2015-01-18: qty 30

## 2015-01-18 MED ORDER — ROSUVASTATIN CALCIUM 10 MG PO TABS
5.0000 mg | ORAL_TABLET | Freq: Every day | ORAL | Status: DC
  Administered 2015-01-19: 5 mg via ORAL
  Filled 2015-01-18: qty 1

## 2015-01-18 MED ORDER — IRBESARTAN 300 MG PO TABS
300.0000 mg | ORAL_TABLET | Freq: Every day | ORAL | Status: DC
  Administered 2015-01-19 – 2015-01-20 (×2): 300 mg via ORAL
  Filled 2015-01-18 (×2): qty 1

## 2015-01-18 MED ORDER — CLONIDINE HCL 0.1 MG PO TABS
0.1000 mg | ORAL_TABLET | Freq: Every day | ORAL | Status: DC
  Administered 2015-01-19 – 2015-01-20 (×2): 0.1 mg via ORAL
  Filled 2015-01-18 (×2): qty 1

## 2015-01-18 MED ORDER — WARFARIN SODIUM 2 MG PO TABS
2.0000 mg | ORAL_TABLET | Freq: Once | ORAL | Status: AC
  Administered 2015-01-18: 2 mg via ORAL
  Filled 2015-01-18: qty 1

## 2015-01-18 MED ORDER — ACETAMINOPHEN 325 MG PO TABS: 650.0000 mg | ORAL_TABLET | Freq: Four times a day (QID) | ORAL | Status: DC | PRN

## 2015-01-18 MED ORDER — METOPROLOL TARTRATE 25 MG PO TABS
25.0000 mg | ORAL_TABLET | Freq: Two times a day (BID) | ORAL | Status: DC
  Administered 2015-01-18 – 2015-01-20 (×4): 25 mg via ORAL
  Filled 2015-01-18 (×4): qty 1

## 2015-01-18 MED ORDER — SODIUM CHLORIDE 0.9 % IV BOLUS (SEPSIS)
500.0000 mL | Freq: Once | INTRAVENOUS | Status: AC
  Administered 2015-01-18: 500 mL via INTRAVENOUS

## 2015-01-18 MED ORDER — ASPIRIN EC 81 MG PO TBEC
81.0000 mg | DELAYED_RELEASE_TABLET | Freq: Every day | ORAL | Status: DC
  Administered 2015-01-19 – 2015-01-20 (×2): 81 mg via ORAL
  Filled 2015-01-18 (×2): qty 1

## 2015-01-18 MED ORDER — HYDRALAZINE HCL 20 MG/ML IJ SOLN: 5.0000 mg | INTRAMUSCULAR | Status: DC | PRN

## 2015-01-18 MED ORDER — CLONIDINE HCL 0.2 MG PO TABS
0.2000 mg | ORAL_TABLET | Freq: Every day | ORAL | Status: DC
  Administered 2015-01-18 – 2015-01-19 (×2): 0.2 mg via ORAL
  Filled 2015-01-18 (×2): qty 1

## 2015-01-18 MED ORDER — HYDRALAZINE HCL 20 MG/ML IJ SOLN
5.0000 mg | Freq: Once | INTRAMUSCULAR | Status: AC
  Administered 2015-01-18: 5 mg via INTRAVENOUS
  Filled 2015-01-18: qty 1

## 2015-01-18 MED ORDER — WARFARIN - PHARMACIST DOSING INPATIENT
Status: DC
  Administered 2015-01-19: 16:00:00

## 2015-01-18 MED ORDER — CLONIDINE HCL 0.1 MG PO TABS: 0.1000 mg | ORAL_TABLET | Freq: Two times a day (BID) | ORAL | Status: DC

## 2015-01-18 MED ORDER — DILTIAZEM HCL ER COATED BEADS 120 MG PO CP24
120.0000 mg | ORAL_CAPSULE | Freq: Two times a day (BID) | ORAL | Status: DC
  Administered 2015-01-18 – 2015-01-20 (×4): 120 mg via ORAL
  Filled 2015-01-18 (×4): qty 1

## 2015-01-18 NOTE — ED Notes (Signed)
Pt states hypertension today with BP of 168/114. Denies blurred vision but states, "My eyes just don't want to work in sync" Pt states generalized weakness. Pt states she has been feeling bad since Sunday and has been more off-balance than usual since Sunday as well.

## 2015-01-18 NOTE — ED Provider Notes (Signed)
CSN: [id number]     Arrival date & time 01/18/15  1544 History   First MD Initiated Contact with Patient 01/18/15 1755     Chief Complaint  Patient presents with  . Hypertension     (Consider location/radiation/quality/duration/timing/severity/associated sxs/prior Treatment) HPI Comments: Patient from home with elevated blood pressure 168/114. States she's not been feeling well for the past 4 days. She states she did feel generally weak, "in another world" with some difficulty walking and feeling more off balance. She also feels like her "eyes are not working in sync". She denies any headache, chills, nausea, vomiting. No chest pain or shortness of breath. No falls. She has chronic weakness and foot drop in her bilateral legs which is unchanged. Denies any difficulty speaking or swallowing. She endorses poor appetite and generalized weakness. No nausea, vomiting, fever or urinary symptoms. She does endorse a dry cough.  The history is provided by the patient and a relative.    Past Medical History  Diagnosis Date  . Paroxysmal atrial fibrillation (HCC)   . TIA (transient ischemic attack) 08/2011    OSH: flushing, left LE numbness, CT negative  . CAD (coronary artery disease)     EF 55% by 2 D Echo 2008 and 2012  . Peripheral neuropathy ([place])   . Peripheral vascular disease ([place])   . Fibromuscular dysplasia (HCC)     S/P PCI right renal aretery 1999  . Hx of echocardiogram 05/2012     EF >55%, LA 3.9 cm, mild LVH  . LVH (left ventricular hypertrophy)     Mild  . Hx of cardiovascular stress test 09/2010    Nuclear stress testing showing no evidence of ischemia, EF=76%, No EKG changes & no perfusion defects.  . Chronic atrial fibrillation ([place])   . Pre-diabetes 01/21/11    Chronic  . Renovascular hypertension   . Hyperlipidemia   . Tic douloureux 1994    S/P successful surgery  . Osteoporosis     With Hx of thoracic compression fractures  . Thoracic compression fracture ([place])   .  DDD (degenerative disc disease)   . Osteoarthritis   . Endometrial carcinoma ([place])   . 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 Chronic fatigue   . Actinic keratosis   . Hip fracture, right ([place]) 08/06/06    S/P surgery  . Chronic edema 10/15/11  . GERD (gastroesophageal reflux disease)   . Tachy-brady syndrome ([place])   . Bilateral leg weakness 09/27/12  . Encounter for long-term (current) use of other medications 09/02/12  . Hypertension     Difficult to control  . Pacemaker    Past Surgical History  Procedure Laterality Date  . Cardiac catheterization  11/05/01    EF 72%. RCA 25%. LCX 50%. Ramus 75/100%. LAD 95%. D2 75%.  . Cholecystectomy  1989  . Vaginal hysterectomy  1990  . Appendectomy  1946  . Lumbar disc surgery    . Renal artery angioplasty Right 1994    Renal artery stenosis secondary to fibromusclar dysplasia  . Renal artery angioplasty Right 09/13/97    PTA/Stent to ostial right renal artery, No distal fibromuscular hyperplasia  . Renal angiogram  09/14/97    25% diffuse left renal artery. 50% ostia; right renal artery.  . Abdominal aortagram  09/05/01    Right & left renals 25%  . Dipyridamole stress test  09/2010    Nuclear stress testing showing no evidence of ischemia, EF=76%, No EKG changes &  no perfusion defects.  . Cesarean section  1959  . Cesarean section  1961  . Spinal fusion  2002    Percutaneous spinal fusion  . Coronary angioplasty    . Total abdominal hysterectomy w/ bilateral salpingoophorectomy  1990  . Coronary angioplasty with stent placement  11/05/01    PTCA/Stent to 95% mid LAD, Ramus lesion could not be crossed and was not treated.    Family History  Problem Relation Age of Onset  . COPD Brother   . Diabetes Brother     DM  . Hypertension Brother   . Stroke Father   . Stroke Other    Social History  Substance Use Topics  . Smoking status: Never Smoker   . Smokeless tobacco: Never Used  . Alcohol Use: Yes      Comment: Rarely   OB History    Gravida Para Term Preterm AB TAB SAB Ectopic Multiple Living   2 2 2       2      Review of Systems  Constitutional: Positive for activity change, appetite change and fatigue. Negative for fever.  HENT: Negative for congestion and rhinorrhea.   Eyes: Negative for visual disturbance.  Respiratory: Negative for cough, chest tightness and shortness of breath.   Cardiovascular: Negative for chest pain.  Gastrointestinal: Negative for nausea, vomiting and abdominal pain.  Genitourinary: Negative for dysuria, vaginal bleeding and vaginal discharge.  Musculoskeletal: Negative for myalgias and arthralgias.  Neurological: Positive for dizziness, weakness and light-headedness.  A complete 10 system review of systems was obtained and all systems are negative except as noted in the HPI and PMH.      Allergies  Amiodarone; Ace inhibitors; Meperidine and related; and Tikosyn  Home Medications   Prior to Admission medications   Medication Sig Start Date End Date Taking? Authorizing Provider  aspirin EC 81 MG tablet Take 81 mg by mouth daily.   Yes Historical Provider, MD  calcium carbonate (TUMS) 500 MG chewable tablet Chew 3 tablets by mouth daily.   Yes Historical Provider, MD  cloNIDine (CATAPRES) 0.1 MG tablet Take 0.1-0.2 mg by mouth 2 (two) times daily. Take 1 tablet in the morning and 2 tablets at night 05/07/09  Yes Historical Provider, MD  diltiazem (CARDIZEM CD) 120 MG 24 hr capsule Take 120 mg by mouth 2 (two) times daily. 11/15/14  Yes Historical Provider, MD  Ergocalciferol (VITAMIN D2) 2000 UNITS TABS Take 1 tablet by mouth daily.    Yes Historical Provider, MD  furosemide (LASIX) 20 MG tablet Take 20 mg by mouth daily as needed for fluid.   Yes Historical Provider, MD  magnesium oxide (MAG-OX) 400 MG tablet Take 400 mg by mouth daily.   Yes Historical Provider, MD  metoprolol tartrate (LOPRESSOR) 25 MG tablet Take 25 mg by mouth 2 (two) times daily.     Yes Historical Provider, MD  Polyethyl Glycol-Propyl Glycol (SYSTANE) 0.4-0.3 % SOLN Apply 1 drop to eye 4 (four) times daily.   Yes Historical Provider, MD  rosuvastatin (CRESTOR) 5 MG tablet Take 5 mg by mouth daily.    Yes Historical Provider, MD  valsartan (DIOVAN) 320 MG tablet Take 320 mg by mouth daily. 04/10/10  Yes Historical Provider, MD  warfarin (COUMADIN) 2 MG tablet Take 2-4 mg by mouth See admin instructions. 4mg  on all days except Mondays,Wed. Fridays. Take 2mg  all other days   Yes Historical Provider, MD  nitroGLYCERIN (NITROSTAT) 0.4 MG SL tablet Place 0.4 mg under the tongue  every 5 (five) minutes as needed for chest pain.  04/10/10   Historical Provider, MD   BP 170/102 mmHg  Pulse 92  Temp(Src) 98.1 F (36.7 C) (Oral)  Resp 16  Ht 5\' 1"  (1.549 m)  Wt 125 lb 3.2 oz (56.79 kg)  BMI 23.67 kg/m2  SpO2 98% Physical Exam  Constitutional: She is oriented to person, place, and time. She appears well-developed and well-nourished. No distress.  HENT:  Head: Normocephalic and atraumatic.  Mouth/Throat: Oropharynx is clear and moist. No oropharyngeal exudate.  Eyes: Conjunctivae and EOM are normal. Pupils are equal, round, and reactive to light.  Neck: Normal range of motion. Neck supple.  No meningismus.  Cardiovascular: Normal rate, regular rhythm, normal heart sounds and intact distal pulses.   No murmur heard. Pulmonary/Chest: Effort normal and breath sounds normal. No respiratory distress.  Abdominal: Soft. There is no tenderness. There is no rebound and no guarding.  Musculoskeletal: Normal range of motion. She exhibits no edema or tenderness.  Neurological: She is alert and oriented to person, place, and time. No cranial nerve deficit. She exhibits normal muscle tone. Coordination normal.  No ataxia on finger to nose bilaterally. No pronator drift. 5/5 strength throughout. CN 2-12 intact. positive Romberg. Equal grip strength. Sensation intact. Gait is ataxic.   Patient  wearing leg raises bilaterally for chronic foot drop. She has minimal ankle flexion and extension.  Skin: Skin is warm.  Psychiatric: She has a normal mood and affect. Her behavior is normal.  Nursing note and vitals reviewed.   ED Course  Procedures (including critical care time) Labs Review Labs Reviewed  PROTIME-INR - Abnormal; Notable for the following:    Prothrombin Time 23.2 (*)    INR 2.08 (*)    All other components within normal limits  CBC - Abnormal; Notable for the following:    WBC 11.8 (*)    RBC 5.27 (*)    Hemoglobin 16.5 (*)    HCT 47.2 (*)    All other components within normal limits  DIFFERENTIAL - Abnormal; Notable for the following:    Neutro Abs 7.9 (*)    Monocytes Absolute 1.3 (*)    All other components within normal limits  COMPREHENSIVE METABOLIC PANEL - Abnormal; Notable for the following:    Sodium 130 (*)    Chloride 96 (*)    Glucose, Bld 109 (*)    All other components within normal limits  URINALYSIS, ROUTINE W REFLEX MICROSCOPIC (NOT AT [facility]) - Abnormal; Notable for the following:    Protein, ur 30 (*)    Leukocytes, UA TRACE (*)    All other components within normal limits  URINE MICROSCOPIC-ADD ON - Abnormal; Notable for the following:    Casts HYALINE CASTS (*)    All other components within normal limits  I-STAT CHEM 8, ED - Abnormal; Notable for the following:    Sodium 132 (*)    Chloride 94 (*)    Glucose, Bld 109 (*)    Hemoglobin 18.0 (*)    HCT 53.0 (*)    All other components within normal limits  ETHANOL  APTT  URINE RAPID DRUG SCREEN, HOSP PERFORMED  LIPASE, BLOOD  CBC  BASIC METABOLIC PANEL  PROTIME-INR  I-STAT TROPOININ, ED    Imaging Review Dg Chest 2 View  01/18/2015  CLINICAL DATA:  Hypertension today EXAM: CHEST  2 VIEW COMPARISON:  December 13, 2014 FINDINGS: The heart size and mediastinal contours are stable. There is no focal infiltrate,  pulmonary edema, or pleural effusion. The visualized skeletal  structures are stable. IMPRESSION: No active cardiopulmonary disease. Electronically Signed   By: Abelardo Diesel M.D.   On: 01/18/2015 18:52   Dg Abd 1 View  01/18/2015  CLINICAL DATA:  81 year old female with hypertension an generalized weakness. Abdominal pain. Initial encounter. EXAM: ABDOMEN - 1 VIEW COMPARISON:  CT Abdomen and Pelvis 12/13/2014. FINDINGS: Portable AP supine view at 2207 hrs. Postoperative changes to the lumbar spine and extensive paraspinal and pelvic sidewall surgical clips re- demonstrated. Stable visualized lung bases. Cardiac pacemaker lead. Non obstructed bowel gas pattern. Osteopenia. Stable visualized osseous structures. No definite pneumoperitoneum on this supine view. Abdominal and pelvic visceral contours appear stable. 4.3 cm infrarenal abdominal aortic aneurysm demonstrated in September. IMPRESSION: 1.  Non obstructed bowel gas pattern. 2. Chronic infrarenal abdominal aortic aneurysm, Recommend followup by ultrasound in 1 year. This recommendation follows ACR consensus guidelines: White Paper of the ACR Incidental Findings Committee II on Vascular Findings. J Am Coll Radiol 2013; 10:789-794. Electronically Signed   By: Genevie Ann M.D.   On: 01/18/2015 22:32   Ct Head Wo Contrast  01/18/2015  CLINICAL DATA:  Hypertension today. EXAM: CT HEAD WITHOUT CONTRAST TECHNIQUE: Contiguous axial images were obtained from the base of the skull through the vertex without intravenous contrast. COMPARISON:  Aug 20, 2011 FINDINGS: There is chronic diffuse atrophy. Chronic bilateral periventricular white matter small vessel ischemic changes identified. There is no midline shift, mild hydrocephalus, or mass. No acute hemorrhage or acute transcortical infarct is identified. The bony calvarium is intact. The visualized sinuses are clear. IMPRESSION: No focal acute intracranial abnormality identified. Chronic diffuse atrophy. Chronic bilateral periventricular white matter small vessel ischemic  change. Electronically Signed   By: Abelardo Diesel M.D.   On: 01/18/2015 18:45   I have personally reviewed and evaluated these images and lab results as part of my medical decision-making.   EKG Interpretation   Date/Time:  Thursday January 18 2015 20:15:06 EDT Ventricular Rate:  82 PR Interval:    QRS Duration: 138 QT Interval:  416 QTC Calculation: 486 R Axis:   106 Text Interpretation:  Accelerated junctional rhythm Nonspecific  intraventricular conduction delay Lateral infarct, age indeterminate  Anteroseptal infarct, old pacer spikes not seen Confirmed by Jacque Garrels  MD,  Tymia Streb [phone]) on 01/18/2015 8:19:05 PM      MDM   Final diagnoses:  Hypertensive urgency  Ataxia   Generalized weakness, difficulty walking, feeling off balance for the past 4 or 5 days.  We'll obtain head CT. Unable to get MRI due to her pacemaker. CT negative for CVA.  Orthostatics negative. UA negative. INR therapeutic.  Hypertensive urgency with worsening ataxia, concern for possible CVA but unable to get MRI.   Interrogate pacer.  Supportive care for now. Observation admission d/w Dr. Marily Memos.     Ezequiel Essex, MD 01/18/15 [phone]

## 2015-01-18 NOTE — Progress Notes (Signed)
[facility] for Warfarin Indication: atrial fibrillation  Allergies  Allergen Reactions  . Amiodarone Other (See Comments)    Worsening peripheral neuropathy  . Ace Inhibitors Hives  . Meperidine And Related   . Tikosyn [Dofetilide] Other (See Comments)    QT prolongation    Patient Measurements: Height: 5\' 1"  (154.9 cm) Weight: 125 lb 3.2 oz (56.79 kg) IBW/kg (Calculated) : 47.8  Vital Signs: Temp: 98.1 F (36.7 C) (10/13 2135) Temp Source: Oral (10/13 2135) BP: 170/102 mmHg (10/13 2135) Pulse Rate: 92 (10/13 2135)  Labs:  Recent Labs  01/18/15 1815 01/18/15 1825 01/18/15 1845  HGB 16.5* 18.0*  --   HCT 47.2* 53.0*  --   PLT 258  --   --   APTT  --   --  33  LABPROT  --   --  23.2*  INR  --   --  2.08*  CREATININE 0.80 0.80  --     Estimated Creatinine Clearance: 35.3 mL/min (by C-G formula based on Cr of 0.8).   Medical History: Past Medical History  Diagnosis Date  . Paroxysmal atrial fibrillation (HCC)   . TIA (transient ischemic attack) 08/2011    OSH: flushing, left LE numbness, CT negative  . CAD (coronary artery disease)     EF 55% by 2 D Echo 2008 and 2012  . Peripheral neuropathy ([place])   . Peripheral vascular disease ([place])   . Fibromuscular dysplasia (HCC)     S/P PCI right renal aretery 1999  . Hx of echocardiogram 05/2012     EF >55%, LA 3.9 cm, mild LVH  . LVH (left ventricular hypertrophy)     Mild  . Hx of cardiovascular stress test 09/2010    Nuclear stress testing showing no evidence of ischemia, EF=76%, No EKG changes & no perfusion defects.  . Chronic atrial fibrillation ([place])   . Pre-diabetes 01/21/11    Chronic  . Renovascular hypertension   . Hyperlipidemia   . Tic douloureux 1994    S/P successful surgery  . Osteoporosis     With Hx of thoracic compression fractures  . Thoracic compression fracture ([place])   . DDD (degenerative disc disease)   . Osteoarthritis   . Endometrial carcinoma  ([place])   . 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 Chronic fatigue   . Actinic keratosis   . Hip fracture, right ([place]) 08/06/06    S/P surgery  . Chronic edema 10/15/11  . GERD (gastroesophageal reflux disease)   . Tachy-brady syndrome ([place])   . Bilateral leg weakness 09/27/12  . Encounter for long-term (current) use of other medications 09/02/12  . Hypertension     Difficult to control  . Pacemaker     Medications:  Prescriptions prior to admission  Medication Sig Dispense Refill Last Dose  . aspirin EC 81 MG tablet Take 81 mg by mouth daily.   01/18/2015 at Unknown time  . calcium carbonate (TUMS) 500 MG chewable tablet Chew 3 tablets by mouth daily.   Past Week at Unknown time  . cloNIDine (CATAPRES) 0.1 MG tablet Take 0.1-0.2 mg by mouth 2 (two) times daily. Take 1 tablet in the morning and 2 tablets at night   01/18/2015 at Unknown time  . diltiazem (CARDIZEM CD) 120 MG 24 hr capsule Take 120 mg by mouth 2 (two) times daily.   01/18/2015 at Unknown time  . Ergocalciferol (VITAMIN D2) 2000 UNITS TABS Take 1 tablet  by mouth daily.    01/18/2015 at Unknown time  . furosemide (LASIX) 20 MG tablet Take 20 mg by mouth daily as needed for fluid.   Past Month at Unknown time  . magnesium oxide (MAG-OX) 400 MG tablet Take 400 mg by mouth daily.   Past Week at Unknown time  . metoprolol tartrate (LOPRESSOR) 25 MG tablet Take 25 mg by mouth 2 (two) times daily.    01/18/2015 at 800  . Polyethyl Glycol-Propyl Glycol (SYSTANE) 0.4-0.3 % SOLN Apply 1 drop to eye 4 (four) times daily.   01/18/2015 at Unknown time  . rosuvastatin (CRESTOR) 5 MG tablet Take 5 mg by mouth daily.    01/18/2015 at Unknown time  . valsartan (DIOVAN) 320 MG tablet Take 320 mg by mouth daily.   01/18/2015 at Unknown time  . warfarin (COUMADIN) 2 MG tablet Take 2-4 mg by mouth See admin instructions. 4mg  on all days except Mondays,Wed. Fridays. Take 2mg  all other days   01/17/2015 at 2100  .  nitroGLYCERIN (NITROSTAT) 0.4 MG SL tablet Place 0.4 mg under the tongue every 5 (five) minutes as needed for chest pain.    unknown    Assessment: Okay for Protocol, INR @ goal.  Has not received dose today per Home Med list.  Goal of Therapy:  INR 2-3   Plan:  Warfarin 2mg  PO x 1 Daily PT/INR Monitor for signs and symptoms of bleeding.   Karina [name] 01/18/2015,10:21 PM

## 2015-01-18 NOTE — ED Notes (Signed)
Contacted [facility] for cardiology for Dr. Wyvonnia Dusky and contacted [employer] to have the pace maker interrogated. Rep will be here within the hour. JGW

## 2015-01-18 NOTE — H&P (Signed)
[facility] History and Physical  Karina [name] RDE:[id number] DOB: Sep 14, 1922 DOA: 01/18/2015  Referring physician: Dr. Wyvonnia Dusky - [facility] PCP: [name], MD   Chief Complaint: Generalized weakness   HPI: Karina [name] is a 81 y.o. female  4-5 days generalized weakness. Constant. Getting worse. No focal deficits. Patient is complaining of written double pain but denies any nausea, diarrhea, or emesis.  Patient states her blood pressure has been elevated over the last several days. Nothing makes her symptoms better. Nothing makes her symptoms worse. Poor by mouth during this period time. States she feels like she is in another world, and that she just doesn't feel like herself. Patient is fairly vague in her description of symptoms and is of very poor historian.   Review of Systems:  Constitutional:  No  night sweats, Fevers, chills,  HEENT:  No headaches, Difficulty swallowing,Tooth/dental problems,Sore throat, Cardio-vascular:  No chest pain, Orthopnea, PND, swelling in lower extremities, anasarca, dizziness, palpitations  GI:  No nausea, vomiting, diarrhea, change in bowel habits,  Resp:   No shortness of breath with exertion or at rest. No excess mucus, no productive cough, No non-productive cough, No coughing up of blood.No change in color of mucus.No wheezing.No chest wall deformity  Skin:  no rash or lesions.  GU:  no dysuria, change in color of urine, no urgency or frequency. No flank pain.  Musculoskeletal:   No joint pain or swelling. No decreased range of motion. No back pain.  Psych:  No change in mood or affect. No depression or anxiety. No memory loss.  Neuro:  No change in sensation, unilateral strength, or cognitive abilities  All other systems were reviewed and are negative.  Past Medical History  Diagnosis Date  . Paroxysmal atrial fibrillation (HCC)   . TIA (transient ischemic attack) 08/2011    OSH: flushing, left LE numbness, CT negative  .  CAD (coronary artery disease)     EF 55% by 2 D Echo 2008 and 2012  . Peripheral neuropathy ([place])   . Peripheral vascular disease ([place])   . Fibromuscular dysplasia (HCC)     S/P PCI right renal aretery 1999  . Hx of echocardiogram 05/2012     EF >55%, LA 3.9 cm, mild LVH  . LVH (left ventricular hypertrophy)     Mild  . Hx of cardiovascular stress test 09/2010    Nuclear stress testing showing no evidence of ischemia, EF=76%, No EKG changes & no perfusion defects.  . Chronic atrial fibrillation ([place])   . Pre-diabetes 01/21/11    Chronic  . Renovascular hypertension   . Hyperlipidemia   . Tic douloureux 1994    S/P successful surgery  . Osteoporosis     With Hx of thoracic compression fractures  . Thoracic compression fracture ([place])   . DDD (degenerative disc disease)   . Osteoarthritis   . Endometrial carcinoma ([place])   . 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 Chronic fatigue   . Actinic keratosis   . Hip fracture, right ([place]) 08/06/06    S/P surgery  . Chronic edema 10/15/11  . GERD (gastroesophageal reflux disease)   . Tachy-brady syndrome ([place])   . Bilateral leg weakness 09/27/12  . Encounter for long-term (current) use of other medications 09/02/12  . Hypertension     Difficult to control  . Pacemaker    Past Surgical History  Procedure Laterality Date  . Cardiac catheterization  11/05/01    EF 72%. RCA 25%.  LCX 50%. Ramus 75/100%. LAD 95%. D2 75%.  . Cholecystectomy  1989  . Vaginal hysterectomy  1990  . Appendectomy  1946  . Lumbar disc surgery    . Renal artery angioplasty Right 1994    Renal artery stenosis secondary to fibromusclar dysplasia  . Renal artery angioplasty Right 09/13/97    PTA/Stent to ostial right renal artery, No distal fibromuscular hyperplasia  . Renal angiogram  09/14/97    25% diffuse left renal artery. 50% ostia; right renal artery.  . Abdominal aortagram  09/05/01    Right & left renals 25%  . Dipyridamole stress  test  09/2010    Nuclear stress testing showing no evidence of ischemia, EF=76%, No EKG changes & no perfusion defects.  . Cesarean section  1959  . Cesarean section  1961  . Spinal fusion  2002    Percutaneous spinal fusion  . Coronary angioplasty    . Total abdominal hysterectomy w/ bilateral salpingoophorectomy  1990  . Coronary angioplasty with stent placement  11/05/01    PTCA/Stent to 95% mid LAD, Ramus lesion could not be crossed and was not treated.    Social History:  reports that she has never smoked. She has never used smokeless tobacco. She reports that she drinks alcohol. She reports that she does not use illicit drugs.  Allergies  Allergen Reactions  . Amiodarone Other (See Comments)    Worsening peripheral neuropathy  . Ace Inhibitors Hives  . Meperidine And Related   . Tikosyn [Dofetilide] Other (See Comments)    QT prolongation    Family History  Problem Relation Age of Onset  . COPD Brother   . Diabetes Brother     DM  . Hypertension Brother   . Stroke Father   . Stroke Other      Prior to Admission medications   Medication Sig Start Date End Date Taking? Authorizing Provider  aspirin EC 81 MG tablet Take 81 mg by mouth daily.   Yes Historical Provider, MD  calcium carbonate (TUMS) 500 MG chewable tablet Chew 3 tablets by mouth daily.   Yes Historical Provider, MD  cloNIDine (CATAPRES) 0.1 MG tablet Take 0.1-0.2 mg by mouth 2 (two) times daily. Take 1 tablet in the morning and 2 tablets at night 05/07/09  Yes Historical Provider, MD  diltiazem (CARDIZEM CD) 120 MG 24 hr capsule Take 120 mg by mouth 2 (two) times daily. 11/15/14  Yes Historical Provider, MD  Ergocalciferol (VITAMIN D2) 2000 UNITS TABS Take 1 tablet by mouth daily.    Yes Historical Provider, MD  furosemide (LASIX) 20 MG tablet Take 20 mg by mouth daily as needed for fluid.   Yes Historical Provider, MD  magnesium oxide (MAG-OX) 400 MG tablet Take 400 mg by mouth daily.   Yes Historical Provider,  MD  metoprolol tartrate (LOPRESSOR) 25 MG tablet Take 25 mg by mouth 2 (two) times daily.    Yes Historical Provider, MD  Polyethyl Glycol-Propyl Glycol (SYSTANE) 0.4-0.3 % SOLN Apply 1 drop to eye 4 (four) times daily.   Yes Historical Provider, MD  rosuvastatin (CRESTOR) 5 MG tablet Take 5 mg by mouth daily.    Yes Historical Provider, MD  valsartan (DIOVAN) 320 MG tablet Take 320 mg by mouth daily. 04/10/10  Yes Historical Provider, MD  warfarin (COUMADIN) 2 MG tablet Take 2-4 mg by mouth See admin instructions. 4mg  on all days except Mondays,Wed. Fridays. Take 2mg  all other days   Yes Historical Provider, MD  nitroGLYCERIN (  NITROSTAT) 0.4 MG SL tablet Place 0.4 mg under the tongue every 5 (five) minutes as needed for chest pain.  04/10/10   Historical Provider, MD   Physical Exam: Filed Vitals:   01/18/15 2030 01/18/15 2100 01/18/15 2115 01/18/15 2135  BP: 175/91 169/96  170/102  Pulse: 82 70 93 92  Temp:    98.1 F (36.7 C)  TempSrc:    Oral  Resp: 10 13 15 16   Height:    5\' 1"  (1.549 m)  Weight:    56.79 kg (125 lb 3.2 oz)  SpO2: 97% 96% 96% 98%    Wt Readings from Last 3 Encounters:  01/18/15 56.79 kg (125 lb 3.2 oz)  12/13/14 55.792 kg (123 lb)  10/23/14 55.792 kg (123 lb)    General:  Appears calm and comfortable Eyes:  PERRL, EOMI, normal lids, iris ENT: Dry mm Neck:  no LAD, masses or thyromegaly Cardiovascular:  RRR, no m/r/g. No LE edema.  Respiratory:  CTA bilaterally, no w/r/r. Normal respiratory effort. Abdomen:  soft, ntnd Skin:  no rash or induration seen on limited exam Musculoskeletal: Global weakness. No focal deficits. Leg braces bilat Psychiatric:  grossly normal mood and affect, speech fluent and appropriate Neurologic:  CN 2-12 grossly intact, ataxic gait which is worse for patient the normal..           Labs on Admission:  Basic Metabolic Panel:  Recent Labs Lab 01/18/15 1815 01/18/15 1825  NA 130* 132*  K 3.7 3.6  CL 96* 94*  CO2 26  --     GLUCOSE 109* 109*  BUN 14 14  CREATININE 0.80 0.80  CALCIUM 9.6  --    Liver Function Tests:  Recent Labs Lab 01/18/15 1815  AST 28  ALT 19  ALKPHOS 93  BILITOT 1.1  PROT 8.1  ALBUMIN 4.4   No results for input(s): LIPASE, AMYLASE in the last 168 hours. No results for input(s): AMMONIA in the last 168 hours. CBC:  Recent Labs Lab 01/18/15 1815 01/18/15 1825  WBC 11.8*  --   NEUTROABS 7.9*  --   HGB 16.5* 18.0*  HCT 47.2* 53.0*  MCV 89.6  --   PLT 258  --    Cardiac Enzymes: No results for input(s): CKTOTAL, CKMB, CKMBINDEX, TROPONINI in the last 168 hours.  BNP (last 3 results) No results for input(s): BNP in the last 8760 hours.  ProBNP (last 3 results) No results for input(s): PROBNP in the last 8760 hours.  CBG: No results for input(s): GLUCAP in the last 168 hours.  Radiological Exams on Admission: Dg Chest 2 View  01/18/2015  CLINICAL DATA:  Hypertension today EXAM: CHEST  2 VIEW COMPARISON:  December 13, 2014 FINDINGS: The heart size and mediastinal contours are stable. There is no focal infiltrate, pulmonary edema, or pleural effusion. The visualized skeletal structures are stable. IMPRESSION: No active cardiopulmonary disease. Electronically Signed   By: Abelardo Diesel M.D.   On: 01/18/2015 18:52   Ct Head Wo Contrast  01/18/2015  CLINICAL DATA:  Hypertension today. EXAM: CT HEAD WITHOUT CONTRAST TECHNIQUE: Contiguous axial images were obtained from the base of the skull through the vertex without intravenous contrast. COMPARISON:  Aug 20, 2011 FINDINGS: There is chronic diffuse atrophy. Chronic bilateral periventricular white matter small vessel ischemic changes identified. There is no midline shift, mild hydrocephalus, or mass. No acute hemorrhage or acute transcortical infarct is identified. The bony calvarium is intact. The visualized sinuses are clear. IMPRESSION: No focal acute  intracranial abnormality identified. Chronic diffuse atrophy. Chronic  bilateral periventricular white matter small vessel ischemic change. Electronically Signed   By: Abelardo Diesel M.D.   On: 01/18/2015 18:45      Assessment/Plan Principal Problem:   Physical deconditioning Active Problems:   Hypertensive urgency   Ataxia   Tachy-brady syndrome (HCC)   Chronic a-fib (HCC)   Dependent edema   HLD (hyperlipidemia)   Physical deconditioning/general weakness: Etiology not immediately clear. Viral etiology???. Unlikely strep given negative CT and ongoing symptoms for 4-5 days. No focal abnormality. History of "neuropathy" causing poor function lower extremities requiring patient to wear braces to prevent foot drop. WBC 11.8 grade UA normal. Unable to obtain MRI due to pacemaker.  - MedSurg - Nutrition consult - PT/OT - +/- neuro consult  - KUB  - Lipase  - Gi cocktail - Consider Echo - IVF 57ml/hr  Hypetensive Urgency:Well above normal. Hydralazine in ED to control. Not taking medications as prescribed in part due to #1 - continue home clonidine, avapro, dilt - hydralazine prn  Tachy/Brady syndrome: pacemaker in place. Interogated in ED, awaiting report  Dependent Edema: chronic condition for pt. Currently no edema - Hold lasix  Hyponatremia: - IVF  Chronic Afib: - continue Coumadin, dilt  HLD - continue crestor  Constipation - continue miralax  Code Status: FULL  DVT Prophylaxis: Warfarin Family Communication: None Disposition Plan:  Pending Improvement    Inanna Telford Lenna Sciara, MD Family Medicine [facility] [email or web address] Password [email or web address]

## 2015-01-19 DIAGNOSIS — I482 Chronic atrial fibrillation: Secondary | ICD-10-CM

## 2015-01-19 DIAGNOSIS — R5381 Other malaise: Secondary | ICD-10-CM | POA: Diagnosis not present

## 2015-01-19 DIAGNOSIS — I160 Hypertensive urgency: Secondary | ICD-10-CM

## 2015-01-19 DIAGNOSIS — R609 Edema, unspecified: Secondary | ICD-10-CM | POA: Diagnosis not present

## 2015-01-19 DIAGNOSIS — E785 Hyperlipidemia, unspecified: Secondary | ICD-10-CM

## 2015-01-19 LAB — CBC
HCT: 42.2 % (ref 36.0–46.0)
Hemoglobin: 14.4 g/dL (ref 12.0–15.0)
MCH: 30.4 pg (ref 26.0–34.0)
MCHC: 34.1 g/dL (ref 30.0–36.0)
MCV: 89.2 fL (ref 78.0–100.0)
PLATELETS: 267 10*3/uL (ref 150–400)
RBC: 4.73 MIL/uL (ref 3.87–5.11)
RDW: 13.5 % (ref 11.5–15.5)
WBC: 9.1 10*3/uL (ref 4.0–10.5)

## 2015-01-19 LAB — BASIC METABOLIC PANEL
ANION GAP: 9 (ref 5–15)
BUN: 15 mg/dL (ref 6–20)
CALCIUM: 8.6 mg/dL — AB (ref 8.9–10.3)
CO2: 25 mmol/L (ref 22–32)
Chloride: 98 mmol/L — ABNORMAL LOW (ref 101–111)
Creatinine, Ser: 0.74 mg/dL (ref 0.44–1.00)
Glucose, Bld: 116 mg/dL — ABNORMAL HIGH (ref 65–99)
POTASSIUM: 3.9 mmol/L (ref 3.5–5.1)
Sodium: 132 mmol/L — ABNORMAL LOW (ref 135–145)

## 2015-01-19 LAB — PROTIME-INR
INR: 2.07 — AB (ref 0.00–1.49)
Prothrombin Time: 23.2 seconds — ABNORMAL HIGH (ref 11.6–15.2)

## 2015-01-19 LAB — SODIUM, URINE, RANDOM: SODIUM UR: 15 mmol/L

## 2015-01-19 MED ORDER — WARFARIN SODIUM 2 MG PO TABS
4.0000 mg | ORAL_TABLET | Freq: Once | ORAL | Status: AC
  Administered 2015-01-19: 4 mg via ORAL
  Filled 2015-01-19: qty 2

## 2015-01-19 MED ORDER — PANTOPRAZOLE SODIUM 40 MG PO TBEC
40.0000 mg | DELAYED_RELEASE_TABLET | Freq: Every day | ORAL | Status: DC
  Administered 2015-01-19 – 2015-01-20 (×2): 40 mg via ORAL
  Filled 2015-01-19 (×2): qty 1

## 2015-01-19 NOTE — Progress Notes (Signed)
[place] for Warfarin Indication: atrial fibrillation  Allergies  Allergen Reactions  . Amiodarone Other (See Comments)    Worsening peripheral neuropathy  . Ace Inhibitors Hives  . Meperidine And Related   . Tikosyn [Dofetilide] Other (See Comments)    QT prolongation    Patient Measurements: Height: 5\' 1"  (154.9 cm) Weight: 125 lb 3.2 oz (56.79 kg) IBW/kg (Calculated) : 47.8  Vital Signs: Temp: 98.1 F (36.7 C) (10/14 0730) Temp Source: Oral (10/14 0730) BP: 123/74 mmHg (10/14 0730) Pulse Rate: 90 (10/14 0730)  Labs:  Recent Labs  01/18/15 1815 01/18/15 1825 01/18/15 1845 01/19/15 0557  HGB 16.5* 18.0*  --  14.4  HCT 47.2* 53.0*  --  42.2  PLT 258  --   --  267  APTT  --   --  33  --   LABPROT  --   --  23.2* 23.2*  INR  --   --  2.08* 2.07*  CREATININE 0.80 0.80  --  0.74    Estimated Creatinine Clearance: 35.3 mL/min (by C-G formula based on Cr of 0.74).   Medical History: Past Medical History  Diagnosis Date  . Paroxysmal atrial fibrillation (HCC)   . TIA (transient ischemic attack) 08/2011    OSH: flushing, left LE numbness, CT negative  . CAD (coronary artery disease)     EF 55% by 2 D Echo 2008 and 2012  . Peripheral neuropathy ([place])   . Peripheral vascular disease ([place])   . Fibromuscular dysplasia (HCC)     S/P PCI right renal aretery 1999  . Hx of echocardiogram 05/2012     EF >55%, LA 3.9 cm, mild LVH  . LVH (left ventricular hypertrophy)     Mild  . Hx of cardiovascular stress test 09/2010    Nuclear stress testing showing no evidence of ischemia, EF=76%, No EKG changes & no perfusion defects.  . Chronic atrial fibrillation ([place])   . Pre-diabetes 01/21/11    Chronic  . Renovascular hypertension   . Hyperlipidemia   . Tic douloureux 1994    S/P successful surgery  . Osteoporosis     With Hx of thoracic compression fractures  . Thoracic compression fracture ([place])   . DDD (degenerative disc disease)    . Osteoarthritis   . Endometrial carcinoma ([place])   . 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 Chronic fatigue   . Actinic keratosis   . Hip fracture, right ([place]) 08/06/06    S/P surgery  . Chronic edema 10/15/11  . GERD (gastroesophageal reflux disease)   . Tachy-brady syndrome ([place])   . Bilateral leg weakness 09/27/12  . Encounter for long-term (current) use of other medications 09/02/12  . Hypertension     Difficult to control  . Pacemaker     Medications:  Prescriptions prior to admission  Medication Sig Dispense Refill Last Dose  . aspirin EC 81 MG tablet Take 81 mg by mouth daily.   01/18/2015 at Unknown time  . calcium carbonate (TUMS) 500 MG chewable tablet Chew 3 tablets by mouth daily.   Past Week at Unknown time  . cloNIDine (CATAPRES) 0.1 MG tablet Take 0.1-0.2 mg by mouth 2 (two) times daily. Take 1 tablet in the morning and 2 tablets at night   01/18/2015 at Unknown time  . diltiazem (CARDIZEM CD) 120 MG 24 hr capsule Take 120 mg by mouth 2 (two) times daily.   01/18/2015 at Unknown time  .  Ergocalciferol (VITAMIN D2) 2000 UNITS TABS Take 1 tablet by mouth daily.    01/18/2015 at Unknown time  . furosemide (LASIX) 20 MG tablet Take 20 mg by mouth daily as needed for fluid.   Past Month at Unknown time  . magnesium oxide (MAG-OX) 400 MG tablet Take 400 mg by mouth daily.   Past Week at Unknown time  . metoprolol tartrate (LOPRESSOR) 25 MG tablet Take 25 mg by mouth 2 (two) times daily.    01/18/2015 at 800  . Polyethyl Glycol-Propyl Glycol (SYSTANE) 0.4-0.3 % SOLN Apply 1 drop to eye 4 (four) times daily.   01/18/2015 at Unknown time  . rosuvastatin (CRESTOR) 5 MG tablet Take 5 mg by mouth daily.    01/18/2015 at Unknown time  . valsartan (DIOVAN) 320 MG tablet Take 320 mg by mouth daily.   01/18/2015 at Unknown time  . warfarin (COUMADIN) 2 MG tablet Take 2-4 mg by mouth See admin instructions. 4mg  on all days except Mondays,Wed. Fridays. Take  2mg  all other days   01/17/2015 at 2100  . nitroGLYCERIN (NITROSTAT) 0.4 MG SL tablet Place 0.4 mg under the tongue every 5 (five) minutes as needed for chest pain.    unknown    Assessment: INR remains therapeutic.  No bleeding complications noted  Goal of Therapy:  INR 2-3   Plan:  Warfarin 4mg  PO x 1 Daily PT/INR Monitor for signs and symptoms of bleeding.   Cotton Beckley Poteet 01/19/2015,7:51 AM

## 2015-01-19 NOTE — Progress Notes (Signed)
Initial Nutrition Assessment  DOCUMENTATION CODES:  Not applicable  INTERVENTION:  Pt declined supps/snacks. Will monitor oral intake and re-assess as warranted.   NUTRITION DIAGNOSIS:  Increased protein needs related to Maintenance of LBM in elderly as evidenced by  Estimated requirements for this population.  GOAL:  Patient will meet greater than or equal to 90% of their needs  MONITOR:  PO intake, Labs, I & O's, Work up  REASON FOR ASSESSMENT:  Consult Assessment of nutrition requirement/status  ASSESSMENT:  81 y/o female PMhx: PVD, HTN, HLD, A-Fib, GERD, Osteoporosis, Neurpathy, CAD, TIA who presents w/ 4-5 days of general weakness, etiology unclear.   Pt reports that, in genera, her appetite has been decreased for 6 months. However, she has still been eating 2 meals each day (she was eating 3/day prior). She does not really follow a diet though she watches the ed meat/fried foods which is desired given her cardiac history. Takes a Vitamin D supplement. No ensure boost  Pt denies n/v/d. She does have ongoing constipation and does not have regular bowel movements. Discussed how hydration can impact bowel regularity and she admits she does not drink near enough fluids.   Her weight is stable.   She states she feels much better. Ate 100% of breakfast and appeared to eat 50% lunch. Declined Supplements/snacks  Frankly, she appears to be very well nourished, especially considering her age.   NFPE: No muscle/fat wasting noted.   Diet Order:  Diet Heart Room service appropriate?: Yes; Fluid consistency:: Thin  Skin:  Reviewed, no issues  Last BM:  10/13  Height:  Ht Readings from Last 1 Encounters:  01/18/15 5\' 1"  (1.549 m)   Weight:  Wt Readings from Last 1 Encounters:  01/18/15 125 lb 3.2 oz (56.79 kg)   Wt Readings from Last 10 Encounters:  01/18/15 125 lb 3.2 oz (56.79 kg)  12/13/14 123 lb (55.792 kg)  10/23/14 123 lb (55.792 kg)  07/14/14 128 lb 8 oz (58.287  kg)  01/29/14 123 lb 7.3 oz (56 kg)  01/15/14 120 lb (54.432 kg)  12/22/13 124 lb (56.246 kg)  09/22/13 126 lb 9.6 oz (57.425 kg)   Ideal Body Weight:  47.73 kg  BMI:  Body mass index is 23.67 kg/(m^2).  Estimated Nutritional Needs:  Kcal:  1300-1450 (23-25 kcal/kg) Protein:  57-63 g (1-1.2 g/kg bw) Fluid:  1.3-1.5 liters flud  EDUCATION NEEDS:  No education needs identified at this time  Burtis Junes RD, LDN Nutrition Pager: [phone] 01/19/2015 1:39 PM

## 2015-01-19 NOTE — Evaluation (Signed)
Occupational Therapy Evaluation Patient Details Name: Karina [name] MRN: [id number] DOB: [birth year] Today's Date: 01/19/2015    History of Present Illness Pt is a 81 y/o female with 4-5 days generalized weakness. Constant. Getting worse. No focal deficits. Patient is complaining of written double pain but denies any nausea, diarrhea, or emesis.    Clinical Impression   Pt awake, alert, and oriented this am. Pt reports independence in ADL tasks, has assistance for household tasks. Pt demonstrates BUE range of motion WNL, strength 4+/5. Pt appears to be at baseline with ADL and functional mobility tasks. No further OT services required at this time.     Follow Up Recommendations  No OT follow up    Equipment Recommendations  None recommended by OT       Precautions / Restrictions Precautions Precautions: None Required Braces or Orthoses: Other Brace/Splint (bilateral AFOs for use during gait) Other Brace/Splint: bilateral AFO Restrictions Weight Bearing Restrictions: No      Mobility Bed Mobility Overal bed mobility: Modified Independent                Transfers Overall transfer level: Modified independent Equipment used: None                  Balance Overall balance assessment: No apparent balance deficits (not formally assessed)                                          ADL Overall ADL's : Modified independent                                             Vision Vision Assessment?: No apparent visual deficits          Pertinent Vitals/Pain Pain Assessment: No/denies pain     Hand Dominance Right   Extremity/Trunk Assessment Upper Extremity Assessment Upper Extremity Assessment: Overall WFL for tasks assessed   Lower Extremity Assessment Lower Extremity Assessment: Generalized weakness (severe neuropathy of ankles with no muscle bulk.Marland KitchenMarland KitchenWears AFOs)   Cervical / Trunk Assessment Cervical / Trunk  Assessment: Normal   Communication Communication Communication: No difficulties   Cognition Arousal/Alertness: Awake/alert Behavior During Therapy: WFL for tasks assessed/performed Overall Cognitive Status: Within Functional Limits for tasks assessed                                Home Living Family/patient expects to be discharged to:: Private residence Living Arrangements: Alone Available Help at Discharge: Personal care attendant;Available PRN/intermittently Type of Home: House Home Access: Level entry     Home Layout: Multi-level Alternate Level Stairs-Number of Steps: 3 story home with stair lift to get upstairs to bedroom. Alternate Level Stairs-Rails: Right Bathroom Shower/Tub: Teacher, early years/pre: Standard     Home Equipment: Cane - single point;Shower seat;Hand held Tourist information centre manager - 2 wheels          Prior Functioning/Environment Level of Independence: Needs assistance  Gait / Transfers Assistance Needed: independent with gait and transfers, occasionally uses a cane when tired...does "furniture walk" ADL's / Homemaking Assistance Needed: daytime aide for household tasks M-F                 OT Goals(Current goals can  be found in the care plan section) Acute Rehab OT Goals Patient Stated Goal: wants to get a little stronger...feels weak  OT Frequency:      End of Session    Activity Tolerance: Patient tolerated treatment well Patient left: in chair;with call bell/phone within reach   Time: 0930-0958 OT Time Calculation (min): 28 min Charges:  OT General Charges $OT Visit: 1 Procedure OT Evaluation $Initial OT Evaluation Tier I: 1 Procedure G-Codes: OT G-codes **NOT FOR INPATIENT CLASS** Functional Assessment Tool Used: clinical judgement Functional Limitation: Self care Self Care Current Status (M5784): At least 1 percent but less than 20 percent impaired, limited or restricted Self Care Goal Status (O9629): At least 1  percent but less than 20 percent impaired, limited or restricted Self Care Discharge Status [phone]): At least 1 percent but less than 20 percent impaired, limited or restricted  Guadelupe Sabin, OTR/L  [phone]  01/19/2015, 11:30 AM

## 2015-01-19 NOTE — Progress Notes (Signed)
[facility] PROGRESS NOTE  Karina [name] QPY:[id number] DOB: 1922-05-23 DOA: 01/18/2015 PCP: [name], MD  HPI/Brief narrative 81yo who presented with 4-5 days generalized weakness. No focal deficits noted on exam and pt denied any nausea, diarrhea, or emesis. The patient was admitted for further workup.  Assessment/Plan: 1. Deconditioning/weakness 1. Seen by PT/OT with recs for HHPT 2. Suspect weakness secondary to dehydration with resultant hyponatremia as pt has admitted to not staying well hydrated and states "I feel bad whenever my sodium goes low" 3. Cont IVF per below  4. Of note, patient was admitted exactly one year ago for similar presenting issues and was found to have symptomatic hyponatremia. Symptoms resolved with hydration and correction of sodium 2. HTN urgency 1. BP stable at present 2. Cont to monitor 3. Hx tachy/brady syndrome 1. Pt is s/p pacemaker placement 2. Rate is controlled 4. Dependent edema 1. Stable thus far 5. Hyponatremia 1. Presenting Na of 130 2. Na improved to 132 today 3. Cont IVF as tolerated 4. Have ordered urine and serum osm and urine sodium, pending 6. Chronic afib 1. Rate controlled 2. On coumadin, dosing per pharmacy 7. HLD 1. Continue on statin per home regimen 8. Constipation 1. Continued on miralax 9. DVT prophylaxis 1. Cont coumadin per above  Code Status: Full Family Communication: Pt in room Disposition Plan: Pending   Consultants:    Procedures:    Antibiotics: Anti-infectives    None      HPI/Subjective: Still feels generally weak  Objective: Filed Vitals:   01/19/15 0330 01/19/15 0530 01/19/15 0730 01/19/15 1209  BP: 99/65 118/79 123/74   Pulse: 96 88 90 88  Temp: 97.6 F (36.4 C) 97.8 F (36.6 C) 98.1 F (36.7 C)   TempSrc: Oral Oral Oral   Resp: 18 18 16    Height:      Weight:      SpO2: 97% 95% 97%     Intake/Output Summary (Last 24 hours) at 01/19/15 1430 Last data filed at  01/19/15 0916  Gross per 24 hour  Intake 1323.75 ml  Output      0 ml  Net 1323.75 ml   Filed Weights   01/18/15 1547 01/18/15 2135  Weight: 55.792 kg (123 lb) 56.79 kg (125 lb 3.2 oz)    Exam:   General:  Awake, in nad  Cardiovascular: regular, s1, s2  Respiratory: normal resp effort, no wheezing  Abdomen: soft,nondistended  Musculoskeletal: perfused, no clubbing   Data Reviewed: Basic Metabolic Panel:  Recent Labs Lab 01/18/15 1815 01/18/15 1825 01/19/15 0557  NA 130* 132* 132*  K 3.7 3.6 3.9  CL 96* 94* 98*  CO2 26  --  25  GLUCOSE 109* 109* 116*  BUN 14 14 15   CREATININE 0.80 0.80 0.74  CALCIUM 9.6  --  8.6*   Liver Function Tests:  Recent Labs Lab 01/18/15 1815  AST 28  ALT 19  ALKPHOS 93  BILITOT 1.1  PROT 8.1  ALBUMIN 4.4    Recent Labs Lab 01/18/15 1845  LIPASE 51   No results for input(s): AMMONIA in the last 168 hours. CBC:  Recent Labs Lab 01/18/15 1815 01/18/15 1825 01/19/15 0557  WBC 11.8*  --  9.1  NEUTROABS 7.9*  --   --   HGB 16.5* 18.0* 14.4  HCT 47.2* 53.0* 42.2  MCV 89.6  --  89.2  PLT 258  --  267   Cardiac Enzymes: No results for input(s): CKTOTAL, CKMB, CKMBINDEX, TROPONINI in the last  168 hours. BNP (last 3 results) No results for input(s): BNP in the last 8760 hours.  ProBNP (last 3 results) No results for input(s): PROBNP in the last 8760 hours.  CBG: No results for input(s): GLUCAP in the last 168 hours.  No results found for this or any previous visit (from the past 240 hour(s)).   Studies: Dg Chest 2 View  01/18/2015  CLINICAL DATA:  Hypertension today EXAM: CHEST  2 VIEW COMPARISON:  December 13, 2014 FINDINGS: The heart size and mediastinal contours are stable. There is no focal infiltrate, pulmonary edema, or pleural effusion. The visualized skeletal structures are stable. IMPRESSION: No active cardiopulmonary disease. Electronically Signed   By: Abelardo Diesel M.D.   On: 01/18/2015 18:52   Dg  Abd 1 View  01/18/2015  CLINICAL DATA:  81 year old female with hypertension an generalized weakness. Abdominal pain. Initial encounter. EXAM: ABDOMEN - 1 VIEW COMPARISON:  CT Abdomen and Pelvis 12/13/2014. FINDINGS: Portable AP supine view at 2207 hrs. Postoperative changes to the lumbar spine and extensive paraspinal and pelvic sidewall surgical clips re- demonstrated. Stable visualized lung bases. Cardiac pacemaker lead. Non obstructed bowel gas pattern. Osteopenia. Stable visualized osseous structures. No definite pneumoperitoneum on this supine view. Abdominal and pelvic visceral contours appear stable. 4.3 cm infrarenal abdominal aortic aneurysm demonstrated in September. IMPRESSION: 1.  Non obstructed bowel gas pattern. 2. Chronic infrarenal abdominal aortic aneurysm, Recommend followup by ultrasound in 1 year. This recommendation follows ACR consensus guidelines: White Paper of the ACR Incidental Findings Committee II on Vascular Findings. J Am Coll Radiol 2013; 10:789-794. Electronically Signed   By: Genevie Ann M.D.   On: 01/18/2015 22:32   Ct Head Wo Contrast  01/18/2015  CLINICAL DATA:  Hypertension today. EXAM: CT HEAD WITHOUT CONTRAST TECHNIQUE: Contiguous axial images were obtained from the base of the skull through the vertex without intravenous contrast. COMPARISON:  Aug 20, 2011 FINDINGS: There is chronic diffuse atrophy. Chronic bilateral periventricular white matter small vessel ischemic changes identified. There is no midline shift, mild hydrocephalus, or mass. No acute hemorrhage or acute transcortical infarct is identified. The bony calvarium is intact. The visualized sinuses are clear. IMPRESSION: No focal acute intracranial abnormality identified. Chronic diffuse atrophy. Chronic bilateral periventricular white matter small vessel ischemic change. Electronically Signed   By: Abelardo Diesel M.D.   On: 01/18/2015 18:45    Scheduled Meds: . aspirin EC  81 mg Oral Daily  . cloNIDine  0.1  mg Oral Daily   And  . cloNIDine  0.2 mg Oral QHS  . diltiazem  120 mg Oral BID  . irbesartan  300 mg Oral Daily  . metoprolol tartrate  25 mg Oral BID  . pantoprazole  40 mg Oral Daily  . rosuvastatin  5 mg Oral q1800  . warfarin  4 mg Oral Once  . Warfarin - Pharmacist Dosing Inpatient   Does not apply Q24H   Continuous Infusions: . sodium chloride 1,000 mL (01/19/15 1235)    Principal Problem:   Physical deconditioning Active Problems:   Hypertensive urgency   Ataxia   Tachy-brady syndrome (HCC)   Chronic a-fib (HCC)   Dependent edema   HLD (hyperlipidemia)   Cortnie Ringel K  [facility] Pager [phone]. If 7PM-7AM, please contact night-coverage at [email or web address], password [facility] 01/19/2015, 2:30 PM

## 2015-01-19 NOTE — Care Management Note (Signed)
Case Management Note  Patient Details  Name: Karina [name] MRN: [id number] Date of Birth: [birth year]   Expected Discharge Date:   01/20/2015               Expected Discharge Plan:  [place]  In-House Referral:  NA  Discharge planning Services  CM Consult  Post Acute Care Choice:  Home Health Choice offered to:  Patient  DME Arranged:    DME Agency:     HH Arranged:  PT [place]:  [place]  Status of Service:  Completed, signed off  Medicare Important Message Given:    Date Medicare IM Given:    Medicare IM give by:    Date Additional Medicare IM Given:    Additional Medicare Important Message give by:     If discussed at [place] of Stay Meetings, dates discussed:    Additional Comments: Pt is from home, ind at baseline. Pt uses a cane for ambulation and is not interested in a walker. PT has recommended HH PT and pt is agreeable. Pt has used [facility] in the past and would like them again. Romualdo Bolk, of [facility], made aware of referral and will obtain pt info from chart. Pt aware [facility] has 48 hours to make first visit. No further CM needs anticipated.  Sherald Barge, RN 01/19/2015, 3:20 PM

## 2015-01-19 NOTE — Evaluation (Addendum)
Physical Therapy Evaluation Patient Details Name: Karina [name] MRN: [id number] DOB: Nov 18, 1922 Today's Date: 01/19/2015   History of Present Illness  Pt is a 81 y/o female with 4-5 days generalized weakness. Constant. Getting worse. No focal deficits. Patient is complaining of written double pain but denies any nausea, diarrhea, or emesis. She lives alone and has daily (M-F) assist with household activities.  Pt normally ambulates with no assistive device but does "furniture walk", sometimes uses a cane.  Clinical Impression  Pt was seen for evaluation.  She was alert and oriented, very cooperative.  She reported feeling much better than yesterday.  Yesterday she had symptomatic orthostatic hypotension so BP was taken sitting and standing.  Sitting BP=154/74 and standing BP=136/75.  She had no dizziness.  She does wear bilateral AFOs due to severe peripheral neuropathy.  She is found to be mildly deconditioned from baseline but was able to ambulate 100' with no significant instability.  I am recommending HHPT for general strengthening and she is agreeable.  We will ask Nursing service to ambulate her over the weekend.  We will follow for general strengthening.    Follow Up Recommendations Home health PT    Equipment Recommendations  None recommended by PT    Recommendations for Other Services   none    Precautions / Restrictions Precautions Precautions: None Required Braces or Orthoses: Other Brace/Splint (bilateral AFOs for use during gait) Other Brace/Splint: bilateral AFO Restrictions Weight Bearing Restrictions: No      Mobility  Bed Mobility Overal bed mobility: Modified Independent                Transfers Overall transfer level: Modified independent Equipment used: None                Ambulation/Gait Ambulation/Gait assistance: Supervision Ambulation Distance (Feet): 100 Feet Assistive device: Straight cane Gait Pattern/deviations: Wide base of  support;Step-through pattern;Steppage   Gait velocity interpretation: >2.62 ft/sec, indicative of independent community ambulator General Gait Details: gait deviations due to use of AFOs  Stairs            Wheelchair Mobility    Modified Rankin (Stroke Patients Only)       Balance Overall balance assessment: No apparent balance deficits (not formally assessed)                                           Pertinent Vitals/Pain Pain Assessment: No/denies pain    Home Living Family/patient expects to be discharged to:: Private residence Living Arrangements: Alone Available Help at Discharge: Personal care attendant;Available PRN/intermittently Type of Home: House Home Access: Level entry     Home Layout: Multi-level Home Equipment: Cane - single point;Shower seat;Hand held Tourist information centre manager - 2 wheels      Prior Function Level of Independence: Needs assistance   Gait / Transfers Assistance Needed: independent with gait and transfers, occasionally uses a cane when tired...does "furniture walk"  ADL's / Homemaking Assistance Needed: daytime aide for household tasks M-F        Hand Dominance   Dominant Hand: Right    Extremity/Trunk Assessment   Upper Extremity Assessment: Overall WFL for tasks assessed           Lower Extremity Assessment: Generalized weakness (severe neuropathy of ankles with no muscle bulk.Marland KitchenMarland KitchenWears AFOs)      Cervical / Trunk Assessment: Normal  Communication   Communication: No  difficulties  Cognition Arousal/Alertness: Awake/alert Behavior During Therapy: WFL for tasks assessed/performed Overall Cognitive Status: Within Functional Limits for tasks assessed                      General Comments      Exercises        Assessment/Plan    PT Assessment Patient needs continued PT services  PT Diagnosis Generalized weakness   PT Problem List Decreased strength;Decreased activity tolerance;Decreased  mobility  PT Treatment Interventions Gait training;Therapeutic exercise   PT Goals (Current goals can be found in the Care Plan section) Acute Rehab PT Goals Patient Stated Goal: wants to get a little stronger...feels weak PT Goal Formulation: With patient Time For Goal Achievement: 01/26/15 Potential to Achieve Goals: Good    Frequency Min 3X/week   Barriers to discharge   none    Co-evaluation               End of Session Equipment Utilized During Treatment: Gait belt Activity Tolerance: Patient tolerated treatment well Patient left: with call bell/phone within reach;in chair      Functional Assessment Tool Used: clinical judgement Functional Limitation: Mobility: Walking and moving around Mobility: Walking and Moving Around Current Status (V6979): At least 1 percent but less than 20 percent impaired, limited or restricted Mobility: Walking and Moving Around Goal Status [phone]): 0 percent impaired, limited or restricted    Time: 1030-1100 PT Time Calculation (min) (ACUTE ONLY): 30 min   Charges:   PT Evaluation $Initial PT Evaluation Tier I: 1 Procedure     PT G Codes:   PT G-Codes **NOT FOR INPATIENT CLASS** Functional Assessment Tool Used: clinical judgement Functional Limitation: Mobility: Walking and moving around Mobility: Walking and Moving Around Current Status (P5374): At least 1 percent but less than 20 percent impaired, limited or restricted Mobility: Walking and Moving Around Goal Status [phone]): 0 percent impaired, limited or restricted    Sable Feil  PT 01/19/2015, 11:18 AM [phone]

## 2015-01-20 DIAGNOSIS — R5381 Other malaise: Secondary | ICD-10-CM | POA: Diagnosis not present

## 2015-01-20 DIAGNOSIS — I495 Sick sinus syndrome: Secondary | ICD-10-CM

## 2015-01-20 DIAGNOSIS — I482 Chronic atrial fibrillation: Secondary | ICD-10-CM | POA: Diagnosis not present

## 2015-01-20 DIAGNOSIS — I160 Hypertensive urgency: Secondary | ICD-10-CM | POA: Diagnosis not present

## 2015-01-20 DIAGNOSIS — R609 Edema, unspecified: Secondary | ICD-10-CM | POA: Diagnosis not present

## 2015-01-20 LAB — PROTIME-INR
INR: 2.54 — AB (ref 0.00–1.49)
Prothrombin Time: 27 seconds — ABNORMAL HIGH (ref 11.6–15.2)

## 2015-01-20 LAB — BASIC METABOLIC PANEL
Anion gap: 5 (ref 5–15)
BUN: 14 mg/dL (ref 6–20)
CALCIUM: 8.4 mg/dL — AB (ref 8.9–10.3)
CO2: 24 mmol/L (ref 22–32)
Chloride: 105 mmol/L (ref 101–111)
Creatinine, Ser: 0.81 mg/dL (ref 0.44–1.00)
GFR calc Af Amer: >60 mL/min (ref >60)
Glucose, Bld: 106 mg/dL — ABNORMAL HIGH (ref 65–99)
POTASSIUM: 3.9 mmol/L (ref 3.5–5.1)
SODIUM: 134 mmol/L — AB (ref 135–145)

## 2015-01-20 LAB — OSMOLALITY: Osmolality: 271 mOsm/kg — ABNORMAL LOW (ref 275–300)

## 2015-01-20 MED ORDER — WARFARIN SODIUM 2 MG PO TABS: 2.0000 mg | ORAL_TABLET | Freq: Once | ORAL | Status: DC

## 2015-01-20 NOTE — Progress Notes (Signed)
Patient was discharged with instructions given on medications,and follow up visits,patient verbalized understanding. No c/o pain or discomfort noted. Accompanied by staff to an awaiting vehicle.Marland Kitchen

## 2015-01-20 NOTE — Progress Notes (Signed)
[place] for Warfarin Indication: atrial fibrillation  Allergies  Allergen Reactions  . Amiodarone Other (See Comments)    Worsening peripheral neuropathy  . Ace Inhibitors Hives  . Meperidine And Related   . Tikosyn [Dofetilide] Other (See Comments)    QT prolongation    Patient Measurements: Height: 5\' 1"  (154.9 cm) Weight: 125 lb 3.2 oz (56.79 kg) IBW/kg (Calculated) : 47.8  Vital Signs: Temp: 98.2 F (36.8 C) (10/15 0454) Temp Source: Oral (10/15 0454) BP: 117/73 mmHg (10/15 0454) Pulse Rate: 90 (10/15 0454)  Labs:  Recent Labs  01/18/15 1815 01/18/15 1825 01/18/15 1845 01/19/15 0557 01/20/15 0616  HGB 16.5* 18.0*  --  14.4  --   HCT 47.2* 53.0*  --  42.2  --   PLT 258  --   --  267  --   APTT  --   --  33  --   --   LABPROT  --   --  23.2* 23.2* 27.0*  INR  --   --  2.08* 2.07* 2.54*  CREATININE 0.80 0.80  --  0.74 0.81    Estimated Creatinine Clearance: 34.8 mL/min (by C-G formula based on Cr of 0.81).   Medical History: Past Medical History  Diagnosis Date  . Paroxysmal atrial fibrillation (HCC)   . TIA (transient ischemic attack) 08/2011    OSH: flushing, left LE numbness, CT negative  . CAD (coronary artery disease)     EF 55% by 2 D Echo 2008 and 2012  . Peripheral neuropathy ([place])   . Peripheral vascular disease ([place])   . Fibromuscular dysplasia (HCC)     S/P PCI right renal aretery 1999  . Hx of echocardiogram 05/2012     EF >55%, LA 3.9 cm, mild LVH  . LVH (left ventricular hypertrophy)     Mild  . Hx of cardiovascular stress test 09/2010    Nuclear stress testing showing no evidence of ischemia, EF=76%, No EKG changes & no perfusion defects.  . Chronic atrial fibrillation ([place])   . Pre-diabetes 01/21/11    Chronic  . Renovascular hypertension   . Hyperlipidemia   . Tic [place] 1994    S/P successful surgery  . Osteoporosis     With Hx of thoracic compression fractures  . Thoracic compression  fracture ([place])   . DDD (degenerative disc disease)   . Osteoarthritis   . Endometrial carcinoma ([place])   . 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 Chronic fatigue   . Actinic keratosis   . Hip fracture, right ([place]) 08/06/06    S/P surgery  . Chronic edema 10/15/11  . GERD (gastroesophageal reflux disease)   . Tachy-brady syndrome ([place])   . Bilateral leg weakness 09/27/12  . Encounter for long-term (current) use of other medications 09/02/12  . Hypertension     Difficult to control  . Pacemaker     Medications:  Prescriptions prior to admission  Medication Sig Dispense Refill Last Dose  . aspirin EC 81 MG tablet Take 81 mg by mouth daily.   01/18/2015 at Unknown time  . calcium carbonate (TUMS) 500 MG chewable tablet Chew 3 tablets by mouth daily.   Past Week at Unknown time  . cloNIDine (CATAPRES) 0.1 MG tablet Take 0.1-0.2 mg by mouth 2 (two) times daily. Take 1 tablet in the morning and 2 tablets at night   01/18/2015 at Unknown time  . diltiazem (CARDIZEM CD) 120 MG 24  hr capsule Take 120 mg by mouth 2 (two) times daily.   01/18/2015 at Unknown time  . Ergocalciferol (VITAMIN D2) 2000 UNITS TABS Take 1 tablet by mouth daily.    01/18/2015 at Unknown time  . furosemide (LASIX) 20 MG tablet Take 20 mg by mouth daily as needed for fluid.   Past Month at Unknown time  . magnesium oxide (MAG-OX) 400 MG tablet Take 400 mg by mouth daily.   Past Week at Unknown time  . metoprolol tartrate (LOPRESSOR) 25 MG tablet Take 25 mg by mouth 2 (two) times daily.    01/18/2015 at 800  . Polyethyl Glycol-Propyl Glycol (SYSTANE) 0.4-0.3 % SOLN Apply 1 drop to eye 4 (four) times daily.   01/18/2015 at Unknown time  . rosuvastatin (CRESTOR) 5 MG tablet Take 5 mg by mouth daily.    01/18/2015 at Unknown time  . valsartan (DIOVAN) 320 MG tablet Take 320 mg by mouth daily.   01/18/2015 at Unknown time  . warfarin (COUMADIN) 2 MG tablet Take 2-4 mg by mouth See admin  instructions. 4mg  on all days except Mondays,Wed. Fridays. Take 2mg  all other days   01/17/2015 at 2100  . nitroGLYCERIN (NITROSTAT) 0.4 MG SL tablet Place 0.4 mg under the tongue every 5 (five) minutes as needed for chest pain.    unknown    Assessment: INR remains therapeutic.  No bleeding complications noted  Goal of Therapy:  INR 2-3   Plan:  Warfarin 2mg  PO x 1 Daily PT/INR Monitor for signs and symptoms of bleeding.   Trampas Stettner [place] 01/20/2015,9:10 AM

## 2015-01-20 NOTE — Discharge Summary (Addendum)
Physician Discharge Summary  Karina [name] IDP:[id number] DOB: October 27, 1922 DOA: 01/18/2015  PCP: [name], MD  Admit date: 01/18/2015 Discharge date: 01/20/2015  Time spent: 20 minutes  Recommendations for Outpatient Follow-up:  1. Follow up with PCP in 1-2 weeks 2. Please repeat BMET in 1 week, focus on sodium  Discharge Diagnoses:  Principal Problem:   Physical deconditioning Active Problems:   Hypertensive urgency   Ataxia   Tachy-brady syndrome (HCC)   Chronic a-fib (HCC)   Dependent edema   HLD (hyperlipidemia)   Discharge Condition: Improved  Diet recommendation: Regular  Filed Weights   01/18/15 1547 01/18/15 2135  Weight: 55.792 kg (123 lb) 56.79 kg (125 lb 3.2 oz)    History of present illness:  Please review h and p from 10/13 for details. Briefly, pt presented with 4-5 days generalized weakness. No focal deficits noted on exam and pt denied any nausea, diarrhea, or emesis. The patient was admitted for further workup.  Hospital Course:  1. Deconditioning/weakness 1. Pt was seen by PT/OT with recs for HHPT 2. Suspect weakness secondary to dehydration with resultant hyponatremia as pt has admitted to not staying well hydrated and states "I feel bad whenever my sodium goes low" 3. Cont IVF per below  4. Of note, patient was admitted exactly one year ago for similar presenting issues and was found to have symptomatic hyponatremia. Symptoms resolved with hydration and correction of sodium 5. Advised to remain off lasix until seen by PCP - does she need to continue lasix? On further questioning, pt reports episodic dependent LE edema that improves with LE elevation. Suspect possible venous stasis and advised patient to keep LE elevated periodically 2. HTN urgency 1. BP remained stable at present 2. Cont to monitor 3. Hx tachy/brady syndrome 1. Pt is s/p pacemaker placement 2. Rate is controlled 4. Dependent edema 1. Stable thus  far 5. Hyponatremia 1. Presenting Na of 130 2. Na improved to 134 by day of discharge with hydration 3. Pt with low urine Na, again suggesting dehydration 6. Chronic afib 1. Rate controlled 2. On coumadin, dosing per pharmacy 7. HLD 1. Continued on statin per home regimen 8. Constipation 1. Continued on miralax 9. DVT prophylaxis 1. Cont coumadin per above  Discharge Exam: Filed Vitals:   01/19/15 1209 01/19/15 1518 01/19/15 2058 01/20/15 0454  BP:  119/78 134/78 117/73  Pulse: 88 70 87 90  Temp:  97.5 F (36.4 C) 98.4 F (36.9 C) 98.2 F (36.8 C)  TempSrc:  Oral Oral Oral  Resp:  18 16 16   Height:      Weight:      SpO2:  98% 96% 99%    General: Awake, in nad Cardiovascular: regular, s1, s2 Respiratory: normal resp effort, no wheezing  Discharge Instructions     Medication List    STOP taking these medications        furosemide 20 MG tablet  Commonly known as:  LASIX      TAKE these medications        aspirin EC 81 MG tablet  Take 81 mg by mouth daily.     cloNIDine 0.1 MG tablet  Commonly known as:  CATAPRES  Take 0.1-0.2 mg by mouth 2 (two) times daily. Take 1 tablet in the morning and 2 tablets at night     diltiazem 120 MG 24 hr capsule  Commonly known as:  CARDIZEM CD  Take 120 mg by mouth 2 (two) times daily.     DIOVAN 320  MG tablet  Generic drug:  valsartan  Take 320 mg by mouth daily.     magnesium oxide 400 MG tablet  Commonly known as:  MAG-OX  Take 400 mg by mouth daily.     metoprolol tartrate 25 MG tablet  Commonly known as:  LOPRESSOR  Take 25 mg by mouth 2 (two) times daily.     NITROSTAT 0.4 MG SL tablet  Generic drug:  nitroGLYCERIN  Place 0.4 mg under the tongue every 5 (five) minutes as needed for chest pain.     rosuvastatin 5 MG tablet  Commonly known as:  CRESTOR  Take 5 mg by mouth daily.     SYSTANE 0.4-0.3 % Soln  Generic drug:  Polyethyl Glycol-Propyl Glycol  Apply 1 drop to eye 4 (four) times daily.      TUMS 500 MG chewable tablet  Generic drug:  calcium carbonate  Chew 3 tablets by mouth daily.     Vitamin D2 2000 UNITS Tabs  Take 1 tablet by mouth daily.     warfarin 2 MG tablet  Commonly known as:  COUMADIN  Take 2-4 mg by mouth See admin instructions. 4mg  on all days except Mondays,Wed. Fridays. Take 2mg  all other days       Allergies  Allergen Reactions  . Amiodarone Other (See Comments)    Worsening peripheral neuropathy  . Ace Inhibitors Hives  . Meperidine And Related   . Tikosyn [Dofetilide] Other (See Comments)    QT prolongation   Follow-up Information    Follow up with [facility].   Contact information:   [place] [address] [phone]       Follow up with [facility], MD. Schedule an appointment as soon as possible for a visit in 1 week.   Specialty:  Internal Medicine   Why:  Hospital follow up   Contact information:   [place]  [phone]        The results of significant diagnostics from this hospitalization (including imaging, microbiology, ancillary and laboratory) are listed below for reference.    Significant Diagnostic Studies: Dg Chest 2 View  01/18/2015  CLINICAL DATA:  Hypertension today EXAM: CHEST  2 VIEW COMPARISON:  December 13, 2014 FINDINGS: The heart size and mediastinal contours are stable. There is no focal infiltrate, pulmonary edema, or pleural effusion. The visualized skeletal structures are stable. IMPRESSION: No active cardiopulmonary disease. Electronically Signed   By: Abelardo Diesel M.D.   On: 01/18/2015 18:52   Dg Abd 1 View  01/18/2015  CLINICAL DATA:  81 year old female with hypertension an generalized weakness. Abdominal pain. Initial encounter. EXAM: ABDOMEN - 1 VIEW COMPARISON:  CT Abdomen and Pelvis 12/13/2014. FINDINGS: Portable AP supine view at 2207 hrs. Postoperative changes to the lumbar spine and extensive paraspinal and pelvic sidewall surgical  clips re- demonstrated. Stable visualized lung bases. Cardiac pacemaker lead. Non obstructed bowel gas pattern. Osteopenia. Stable visualized osseous structures. No definite pneumoperitoneum on this supine view. Abdominal and pelvic visceral contours appear stable. 4.3 cm infrarenal abdominal aortic aneurysm demonstrated in September. IMPRESSION: 1.  Non obstructed bowel gas pattern. 2. Chronic infrarenal abdominal aortic aneurysm, Recommend followup by ultrasound in 1 year. This recommendation follows ACR consensus guidelines: White Paper of the ACR Incidental Findings Committee II on Vascular Findings. J Am Coll Radiol 2013; 10:789-794. Electronically Signed   By: Genevie Ann M.D.   On: 01/18/2015 22:32   Ct Head Wo Contrast  01/18/2015  CLINICAL DATA:  Hypertension today. EXAM: CT HEAD WITHOUT CONTRAST TECHNIQUE: Contiguous axial images were obtained from the base of the skull through the vertex without intravenous contrast. COMPARISON:  Aug 20, 2011 FINDINGS: There is chronic diffuse atrophy. Chronic bilateral periventricular white matter small vessel ischemic changes identified. There is no midline shift, mild hydrocephalus, or mass. No acute hemorrhage or acute transcortical infarct is identified. The bony calvarium is intact. The visualized sinuses are clear. IMPRESSION: No focal acute intracranial abnormality identified. Chronic diffuse atrophy. Chronic bilateral periventricular white matter small vessel ischemic change. Electronically Signed   By: Abelardo Diesel M.D.   On: 01/18/2015 18:45    Microbiology: No results found for this or any previous visit (from the past 240 hour(s)).   Labs: Basic Metabolic Panel:  Recent Labs Lab 01/18/15 1815 01/18/15 1825 01/19/15 0557 01/20/15 0616  NA 130* 132* 132* 134*  K 3.7 3.6 3.9 3.9  CL 96* 94* 98* 105  CO2 26  --  25 24  GLUCOSE 109* 109* 116* 106*  BUN 14 14 15 14   CREATININE 0.80 0.80 0.74 0.81  CALCIUM 9.6  --  8.6* 8.4*   Liver Function  Tests:  Recent Labs Lab 01/18/15 1815  AST 28  ALT 19  ALKPHOS 93  BILITOT 1.1  PROT 8.1  ALBUMIN 4.4    Recent Labs Lab 01/18/15 1845  LIPASE 51   No results for input(s): AMMONIA in the last 168 hours. CBC:  Recent Labs Lab 01/18/15 1815 01/18/15 1825 01/19/15 0557  WBC 11.8*  --  9.1  NEUTROABS 7.9*  --   --   HGB 16.5* 18.0* 14.4  HCT 47.2* 53.0* 42.2  MCV 89.6  --  89.2  PLT 258  --  267   Cardiac Enzymes: No results for input(s): CKTOTAL, CKMB, CKMBINDEX, TROPONINI in the last 168 hours. BNP: BNP (last 3 results) No results for input(s): BNP in the last 8760 hours.  ProBNP (last 3 results) No results for input(s): PROBNP in the last 8760 hours.  CBG: No results for input(s): GLUCAP in the last 168 hours.   Signed:  CHIU, Orpah Melter  [facility] 01/20/2015, 3:36 PM

## 2015-01-21 LAB — OSMOLALITY, URINE: Osmolality, Ur: 150 mOsm/kg — ABNORMAL LOW (ref 390–1090)

## 2015-01-23 ENCOUNTER — Encounter: Payer: Self-pay | Admitting: Cardiology

## 2015-02-02 ENCOUNTER — Encounter: Payer: Self-pay | Admitting: Cardiology

## 2015-02-07 ENCOUNTER — Encounter: Payer: Self-pay | Admitting: Internal Medicine

## 2015-04-18 ENCOUNTER — Ambulatory Visit (INDEPENDENT_AMBULATORY_CARE_PROVIDER_SITE_OTHER): Payer: Medicare Other | Admitting: *Deleted

## 2015-04-18 DIAGNOSIS — I4891 Unspecified atrial fibrillation: Secondary | ICD-10-CM | POA: Diagnosis not present

## 2015-04-18 NOTE — Progress Notes (Signed)
Remote pacemaker transmission.   

## 2015-05-06 ENCOUNTER — Encounter (HOSPITAL_COMMUNITY): Payer: Self-pay | Admitting: Emergency Medicine

## 2015-05-06 ENCOUNTER — Emergency Department (HOSPITAL_COMMUNITY)
Admission: EM | Admit: 2015-05-06 | Discharge: 2015-05-06 | Disposition: A | Discharge status: Home / Self Care | Payer: Medicare Other | Attending: Emergency Medicine | Admitting: Emergency Medicine

## 2015-05-06 DIAGNOSIS — R531 Weakness: Secondary | ICD-10-CM | POA: Diagnosis present

## 2015-05-06 DIAGNOSIS — I480 Paroxysmal atrial fibrillation: Secondary | ICD-10-CM | POA: Insufficient documentation

## 2015-05-06 DIAGNOSIS — Z872 Personal history of diseases of the skin and subcutaneous tissue: Secondary | ICD-10-CM | POA: Diagnosis not present

## 2015-05-06 DIAGNOSIS — Z8669 Personal history of other diseases of the nervous system and sense organs: Secondary | ICD-10-CM | POA: Insufficient documentation

## 2015-05-06 DIAGNOSIS — Z8781 Personal history of (healed) traumatic fracture: Secondary | ICD-10-CM | POA: Diagnosis not present

## 2015-05-06 DIAGNOSIS — M1990 Unspecified osteoarthritis, unspecified site: Secondary | ICD-10-CM | POA: Insufficient documentation

## 2015-05-06 DIAGNOSIS — K219 Gastro-esophageal reflux disease without esophagitis: Secondary | ICD-10-CM | POA: Insufficient documentation

## 2015-05-06 DIAGNOSIS — Z8542 Personal history of malignant neoplasm of other parts of uterus: Secondary | ICD-10-CM | POA: Diagnosis not present

## 2015-05-06 DIAGNOSIS — Z7901 Long term (current) use of anticoagulants: Secondary | ICD-10-CM | POA: Diagnosis not present

## 2015-05-06 DIAGNOSIS — Z7982 Long term (current) use of aspirin: Secondary | ICD-10-CM | POA: Diagnosis not present

## 2015-05-06 DIAGNOSIS — I10 Essential (primary) hypertension: Secondary | ICD-10-CM | POA: Insufficient documentation

## 2015-05-06 DIAGNOSIS — Z9861 Coronary angioplasty status: Secondary | ICD-10-CM | POA: Insufficient documentation

## 2015-05-06 DIAGNOSIS — M810 Age-related osteoporosis without current pathological fracture: Secondary | ICD-10-CM | POA: Diagnosis not present

## 2015-05-06 DIAGNOSIS — Z79899 Other long term (current) drug therapy: Secondary | ICD-10-CM | POA: Diagnosis not present

## 2015-05-06 DIAGNOSIS — E785 Hyperlipidemia, unspecified: Secondary | ICD-10-CM | POA: Diagnosis not present

## 2015-05-06 DIAGNOSIS — Z9889 Other specified postprocedural states: Secondary | ICD-10-CM | POA: Insufficient documentation

## 2015-05-06 DIAGNOSIS — I2510 Atherosclerotic heart disease of native coronary artery without angina pectoris: Secondary | ICD-10-CM | POA: Insufficient documentation

## 2015-05-06 DIAGNOSIS — Z8673 Personal history of transient ischemic attack (TIA), and cerebral infarction without residual deficits: Secondary | ICD-10-CM | POA: Diagnosis not present

## 2015-05-06 DIAGNOSIS — Z950 Presence of cardiac pacemaker: Secondary | ICD-10-CM | POA: Insufficient documentation

## 2015-05-06 LAB — CBC WITH DIFFERENTIAL/PLATELET
Basophils Absolute: 0.1 10*3/uL (ref 0.0–0.1)
Basophils Relative: 1 %
Eosinophils Absolute: 0.4 10*3/uL (ref 0.0–0.7)
Eosinophils Relative: 4 %
HCT: 39.6 % (ref 36.0–46.0)
Hemoglobin: 13.2 g/dL (ref 12.0–15.0)
Lymphocytes Relative: 21 %
Lymphs Abs: 1.9 10*3/uL (ref 0.7–4.0)
MCH: 29.6 pg (ref 26.0–34.0)
MCHC: 33.3 g/dL (ref 30.0–36.0)
MCV: 88.8 fL (ref 78.0–100.0)
Monocytes Absolute: 1 10*3/uL (ref 0.1–1.0)
Monocytes Relative: 12 %
Neutro Abs: 5.7 10*3/uL (ref 1.7–7.7)
Neutrophils Relative %: 62 %
Platelets: 277 10*3/uL (ref 150–400)
RBC: 4.46 MIL/uL (ref 3.87–5.11)
RDW: 12.7 % (ref 11.5–15.5)
WBC: 9 10*3/uL (ref 4.0–10.5)

## 2015-05-06 LAB — TROPONIN I: Troponin I: <0.03 ng/mL (ref <0.031)

## 2015-05-06 LAB — BASIC METABOLIC PANEL
Anion gap: 9 (ref 5–15)
BUN: 13 mg/dL (ref 6–20)
CO2: 27 mmol/L (ref 22–32)
Calcium: 9.1 mg/dL (ref 8.9–10.3)
Chloride: 100 mmol/L — ABNORMAL LOW (ref 101–111)
Creatinine, Ser: 0.88 mg/dL (ref 0.44–1.00)
GFR calc Af Amer: >60 mL/min (ref >60)
GFR calc non Af Amer: 56 mL/min — ABNORMAL LOW (ref >60)
Glucose, Bld: 129 mg/dL — ABNORMAL HIGH (ref 65–99)
Potassium: 3.8 mmol/L (ref 3.5–5.1)
Sodium: 136 mmol/L (ref 135–145)

## 2015-05-06 NOTE — ED Provider Notes (Signed)
CSN: [id number]     Arrival date & time 05/06/15  1850 History  By signing my name below, I, [facility], attest that this documentation has been prepared under the direction and in the presence of Virgel Manifold, MD. Electronically Signed: Virgel Bouquet, ED Scribe. 05/06/2015. 8:24 PM.   Chief Complaint  Patient presents with  . Weakness   The history is provided by the patient. No language interpreter was used.   HPI Comments: Karina [name] is a 82 y.o. female with an hx of HTN, CAD, A-Fib, HLN, and peripheral neuropathy who presents to the Emergency Department complaining of constant, unchanging, weakness onset 2 hours ago. Patient reports that she took her blood pressure earlier tonight and noted that it was elevated to 170/117 and a HR of 92. She endorses tremors in her bilateral hands that resolved PTA. She has taken Lopressor, most recently 2 hours ago, slight relief. Per patient she states, that she was especially active on Friday. She notes similar symptoms in the past that she states was associated with elevated BP. She denies any pains, nausea, and difficulty urinating.  Past Medical History  Diagnosis Date  . Paroxysmal atrial fibrillation (HCC)   . TIA (transient ischemic attack) 08/2011    OSH: flushing, left LE numbness, CT negative  . CAD (coronary artery disease)     EF 55% by 2 D Echo 2008 and 2012  . Peripheral neuropathy ([place])   . Peripheral vascular disease ([place])   . Fibromuscular dysplasia (HCC)     S/P PCI right renal aretery 1999  . Hx of echocardiogram 05/2012     EF >55%, LA 3.9 cm, mild LVH  . LVH (left ventricular hypertrophy)     Mild  . Hx of cardiovascular stress test 09/2010    Nuclear stress testing showing no evidence of ischemia, EF=76%, No EKG changes & no perfusion defects.  . Chronic atrial fibrillation ([place])   . Pre-diabetes 01/21/11    Chronic  . Renovascular hypertension   . Hyperlipidemia   . Tic douloureux 1994    S/P  successful surgery  . Osteoporosis     With Hx of thoracic compression fractures  . Thoracic compression fracture ([place])   . DDD (degenerative disc disease)   . Osteoarthritis   . Endometrial carcinoma ([place])   . 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 Chronic fatigue   . Actinic keratosis   . Hip fracture, right ([place]) 08/06/06    S/P surgery  . Chronic edema 10/15/11  . GERD (gastroesophageal reflux disease)   . Tachy-brady syndrome ([place])   . Bilateral leg weakness 09/27/12  . Encounter for long-term (current) use of other medications 09/02/12  . Hypertension     Difficult to control  . Pacemaker    Past Surgical History  Procedure Laterality Date  . Cardiac catheterization  11/05/01    EF 72%. RCA 25%. LCX 50%. Ramus 75/100%. LAD 95%. D2 75%.  . Cholecystectomy  1989  . Vaginal hysterectomy  1990  . Appendectomy  1946  . Lumbar disc surgery    . Renal artery angioplasty Right 1994    Renal artery stenosis secondary to fibromusclar dysplasia  . Renal artery angioplasty Right 09/13/97    PTA/Stent to ostial right renal artery, No distal fibromuscular hyperplasia  . Renal angiogram  09/14/97    25% diffuse left renal artery. 50% ostia; right renal artery.  . Abdominal aortagram  09/05/01    Right & left  renals 25%  . Dipyridamole stress test  09/2010    Nuclear stress testing showing no evidence of ischemia, EF=76%, No EKG changes & no perfusion defects.  . Cesarean section  1959  . Cesarean section  1961  . Spinal fusion  2002    Percutaneous spinal fusion  . Coronary angioplasty    . Total abdominal hysterectomy w/ bilateral salpingoophorectomy  1990  . Coronary angioplasty with stent placement  11/05/01    PTCA/Stent to 95% mid LAD, Ramus lesion could not be crossed and was not treated.    Family History  Problem Relation Age of Onset  . COPD Brother   . Diabetes Brother     DM  . Hypertension Brother   . Stroke Father   . Stroke Other     Social History  Substance Use Topics  . Smoking status: Never Smoker   . Smokeless tobacco: Never Used  . Alcohol Use: Yes     Comment: Rarely   OB History    Gravida Para Term Preterm AB TAB SAB Ectopic Multiple Living   2 2 2       2      Review of Systems  Gastrointestinal: Negative for nausea.  Genitourinary: Negative for difficulty urinating.  Neurological: Positive for weakness. Negative for tremors (Resolved PTA).  All other systems reviewed and are negative.     Allergies  Amiodarone; Ace inhibitors; Meperidine and related; and Tikosyn  Home Medications   Prior to Admission medications   Medication Sig Start Date End Date Taking? Authorizing Provider  aspirin EC 81 MG tablet Take 81 mg by mouth daily.    Historical Provider, MD  calcium carbonate (TUMS) 500 MG chewable tablet Chew 3 tablets by mouth daily.    Historical Provider, MD  cloNIDine (CATAPRES) 0.1 MG tablet Take 0.1-0.2 mg by mouth 2 (two) times daily. Take 1 tablet in the morning and 2 tablets at night 05/07/09   Historical Provider, MD  diltiazem (CARDIZEM CD) 120 MG 24 hr capsule Take 120 mg by mouth 2 (two) times daily. 11/15/14   Historical Provider, MD  Ergocalciferol (VITAMIN D2) 2000 UNITS TABS Take 1 tablet by mouth daily.     Historical Provider, MD  magnesium oxide (MAG-OX) 400 MG tablet Take 400 mg by mouth daily.    Historical Provider, MD  metoprolol tartrate (LOPRESSOR) 25 MG tablet Take 25 mg by mouth 2 (two) times daily.     Historical Provider, MD  nitroGLYCERIN (NITROSTAT) 0.4 MG SL tablet Place 0.4 mg under the tongue every 5 (five) minutes as needed for chest pain.  04/10/10   Historical Provider, MD  Polyethyl Glycol-Propyl Glycol (SYSTANE) 0.4-0.3 % SOLN Apply 1 drop to eye 4 (four) times daily.    Historical Provider, MD  rosuvastatin (CRESTOR) 5 MG tablet Take 5 mg by mouth daily.     Historical Provider, MD  valsartan (DIOVAN) 320 MG tablet Take 320 mg by mouth daily. 04/10/10    Historical Provider, MD  warfarin (COUMADIN) 2 MG tablet Take 2-4 mg by mouth See admin instructions. 4mg  on all days except Mondays,Wed. Fridays. Take 2mg  all other days    Historical Provider, MD   BP 141/99 mmHg  Pulse 89  Temp(Src) 98.2 F (36.8 C) (Temporal)  Resp 20  Ht 5\' 1"  (1.549 m)  Wt 125 lb (56.7 kg)  BMI 23.63 kg/m2  SpO2 94% Physical Exam  Constitutional: She is oriented to person, place, and time. She appears well-developed and well-nourished. No  distress.  HENT:  Head: Normocephalic and atraumatic.  Eyes: EOM are normal.  Neck: Normal range of motion.  Cardiovascular: Normal rate, regular rhythm and normal heart sounds.   Pulmonary/Chest: Effort normal and breath sounds normal.  Abdominal: Soft. She exhibits no distension. There is no tenderness.  Musculoskeletal: Normal range of motion.  Neurological: She is alert and oriented to person, place, and time. She displays normal reflexes. No cranial nerve deficit. She exhibits normal muscle tone. Coordination normal.  Good finger-to-nose test bilaterally.  Skin: Skin is warm and dry.  Psychiatric: She has a normal mood and affect. Judgment normal.  Nursing note and vitals reviewed.   ED Course  Procedures  DIAGNOSTIC STUDIES: Oxygen Saturation is 94% on RA, adequate by my interpretation.    COORDINATION OF CARE: 8:00 PM Will order labs and EKG and return to discuss results. Discussed treatment plan with pt at bedside and pt agreed to plan.  Labs Review Labs Reviewed  BASIC METABOLIC PANEL - Abnormal; Notable for the following:    Chloride 100 (*)    Glucose, Bld 129 (*)    GFR calc non Af Amer 56 (*)    All other components within normal limits  CBC WITH DIFFERENTIAL/PLATELET  TROPONIN I    Imaging Review No results found. I have personally reviewed and evaluated these images and lab results as part of my medical decision-making.   EKG Interpretation   Date/Time:  Sunday May 06 2015 20:27:26  EST Ventricular Rate:  77 PR Interval:  198 QRS Duration: 140 QT Interval:  464 QTC Calculation: 525 R Axis:   107 Text Interpretation:  suspect paced rhythm although spikes not  consistently seen Confirmed by Wilson Singer  MD, Liev Brockbank ([id number]) on 05/06/2015  10:13:25 PM      MDM   Final diagnoses:  Generalized weakness    82 year old female with generalized weakness of unclear etiology. I have a low suspicion for emergent process though. She is actually very well appearing for her age. Nonfocal exam. Fairly unremarkable workup. Hemodynamically stable. At this point I feel she is safe for discharge. Return precautions were discussed.PCP follow-up she has  Persistent symptoms.  I personally preformed the services scribed in my presence. The recorded information has been reviewed is accurate. Virgel Manifold, MD.   Virgel Manifold, MD 05/09/15 [phone]

## 2015-05-06 NOTE — ED Notes (Signed)
Pt states she was feeling fine until about 2 hours ago and then started feeling weak.  Says her BP was 170/117 and HR of 92 and "I didn't like those numbers"

## 2015-05-06 NOTE — Discharge Instructions (Signed)

## 2015-05-08 LAB — CUP PACEART REMOTE DEVICE CHECK
Implantable Lead Location: 753860
Implantable Lead Serial Number: 29601447
Lead Channel Impedance Value: 624 Ohm
Lead Channel Pacing Threshold Pulse Width: 0.4 ms
Lead Channel Sensing Intrinsic Amplitude: 13.1 mV
Lead Channel Setting Pacing Amplitude: 1.1 V
MDC IDC LEAD IMPLANT DT: 20150413
MDC IDC MSMT LEADCHNL RV PACING THRESHOLD AMPLITUDE: 0.6 V
MDC IDC PG SERIAL: 68133668
MDC IDC SESS DTM: 20170131102227
MDC IDC SET LEADCHNL RV PACING PULSEWIDTH: 0.4 ms
MDC IDC SET LEADCHNL RV SENSING SENSITIVITY: 2.5 mV
MDC IDC STAT BRADY RV PERCENT PACED: 86 %

## 2015-05-09 ENCOUNTER — Encounter: Payer: Self-pay | Admitting: Cardiology

## 2015-07-18 ENCOUNTER — Ambulatory Visit (INDEPENDENT_AMBULATORY_CARE_PROVIDER_SITE_OTHER): Payer: Medicare Other | Admitting: Internal Medicine

## 2015-07-18 ENCOUNTER — Encounter: Payer: Self-pay | Admitting: Internal Medicine

## 2015-07-18 VITALS — BP 126/80 | HR 79 | Ht 61.5 in | Wt 128.0 lb

## 2015-07-18 DIAGNOSIS — I482 Chronic atrial fibrillation, unspecified: Secondary | ICD-10-CM

## 2015-07-18 NOTE — Patient Instructions (Signed)

## 2015-07-18 NOTE — Progress Notes (Signed)
HPI Karina [name] returns today for ongoing treatment of her atrial fibrillation. She is a very pleasant 82 yo woman with a h/o atrial fibrillation, symptomatic bradycardia, CAD, s/p PTCA and stenting of her LAD remotely, who developed atrial fibrillation a couple of years ago which has no become chronic. She was tried on both Tikosyn and amiodarone without success and is now on a strategy of rate control. She denies peripheral edema or syncope. She has peripheral neuropathy. She had a Biotronik DDD PM placed. In the interim, she has had some periods of increased blood pressure requiring ER  Visits.  Allergies  Allergen Reactions  . Amiodarone Other (See Comments)    Worsening peripheral neuropathy  . Ace Inhibitors Hives  . Meperidine And Related Other (See Comments)    Unknown  . Tikosyn [Dofetilide] Other (See Comments)    QT prolongation     Current Outpatient Prescriptions  Medication Sig Dispense Refill  . aspirin EC 81 MG tablet Take 81 mg by mouth daily.    . calcium carbonate (TUMS) 500 MG chewable tablet Chew 3 tablets by mouth daily.    . cloNIDine (CATAPRES) 0.1 MG tablet Take 0.1-0.2 mg by mouth 2 (two) times daily. Take 1 tablet in the morning and 2 tablets at night    . cycloSPORINE (RESTASIS) 0.05 % ophthalmic emulsion Place 1 drop into both eyes 2 (two) times daily.    Marland Kitchen diltiazem (CARDIZEM CD) 120 MG 24 hr capsule Take 120 mg by mouth 2 (two) times daily.    . Ergocalciferol (VITAMIN D2) 2000 UNITS TABS Take 1 tablet by mouth daily.     . metoprolol tartrate (LOPRESSOR) 25 MG tablet Take 25 mg by mouth 2 (two) times daily.     . nitroGLYCERIN (NITROSTAT) 0.4 MG SL tablet Place 0.4 mg under the tongue every 5 (five) minutes as needed for chest pain.     . pantoprazole (PROTONIX) 40 MG tablet Take 40 mg by mouth daily.    . rosuvastatin (CRESTOR) 5 MG tablet Take 5 mg by mouth daily.     . traMADol (ULTRAM) 50 MG tablet Take 50 mg by mouth every 6 (six) hours as  needed.    . valsartan (DIOVAN) 320 MG tablet Take 320 mg by mouth daily.    Marland Kitchen warfarin (COUMADIN) 2 MG tablet Take 2-4 mg by mouth See admin instructions. 4mg  on all days except Mondays,Wed. Fridays, patient takes 2mg .     No current facility-administered medications for this visit.     Past Medical History  Diagnosis Date  . Paroxysmal atrial fibrillation (HCC)   . TIA (transient ischemic attack) 08/2011    OSH: flushing, left LE numbness, CT negative  . CAD (coronary artery disease)     EF 55% by 2 D Echo 2008 and 2012  . Peripheral neuropathy ([place])   . Peripheral vascular disease ([place])   . Fibromuscular dysplasia (HCC)     S/P PCI right renal aretery 1999  . Hx of echocardiogram 05/2012     EF >55%, LA 3.9 cm, mild LVH  . LVH (left ventricular hypertrophy)     Mild  . Hx of cardiovascular stress test 09/2010    Nuclear stress testing showing no evidence of ischemia, EF=76%, No EKG changes & no perfusion defects.  . Chronic atrial fibrillation ([place])   . Pre-diabetes 01/21/11    Chronic  . Renovascular hypertension   . Hyperlipidemia   . Tic douloureux 1994  S/P successful surgery  . Osteoporosis     With Hx of thoracic compression fractures  . Thoracic compression fracture ([place])   . DDD (degenerative disc disease)   . Osteoarthritis   . Endometrial carcinoma ([place])   . 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 Chronic fatigue   . Actinic keratosis   . Hip fracture, right ([place]) 08/06/06    S/P surgery  . Chronic edema 10/15/11  . GERD (gastroesophageal reflux disease)   . Tachy-brady syndrome ([place])   . Bilateral leg weakness 09/27/12  . Encounter for long-term (current) use of other medications 09/02/12  . Hypertension     Difficult to control  . Pacemaker     ROS:   All systems reviewed and negative except as noted in the HPI.   Past Surgical History  Procedure Laterality Date  . Cardiac catheterization  11/05/01    EF 72%. RCA 25%.  LCX 50%. Ramus 75/100%. LAD 95%. D2 75%.  . Cholecystectomy  1989  . Vaginal hysterectomy  1990  . Appendectomy  1946  . Lumbar disc surgery    . Renal artery angioplasty Right 1994    Renal artery stenosis secondary to fibromusclar dysplasia  . Renal artery angioplasty Right 09/13/97    PTA/Stent to ostial right renal artery, No distal fibromuscular hyperplasia  . Renal angiogram  09/14/97    25% diffuse left renal artery. 50% ostia; right renal artery.  . Abdominal aortagram  09/05/01    Right & left renals 25%  . Dipyridamole stress test  09/2010    Nuclear stress testing showing no evidence of ischemia, EF=76%, No EKG changes & no perfusion defects.  . Cesarean section  1959  . Cesarean section  1961  . Spinal fusion  2002    Percutaneous spinal fusion  . Coronary angioplasty    . Total abdominal hysterectomy w/ bilateral salpingoophorectomy  1990  . Coronary angioplasty with stent placement  11/05/01    PTCA/Stent to 95% mid LAD, Ramus lesion could not be crossed and was not treated.      Family History  Problem Relation Age of Onset  . COPD Brother   . Diabetes Brother     DM  . Hypertension Brother   . Stroke Father   . Stroke Other      Social History   Social History  . Marital Status: Widowed    Spouse Name: N/A  . Number of Children: N/A  . Years of Education: N/A   Occupational History  . Not on file.   Social History Main Topics  . Smoking status: Never Smoker   . Smokeless tobacco: Never Used  . Alcohol Use: 0.0 oz/week    0 Standard drinks or equivalent per week     Comment: Rarely  . Drug Use: No  . Sexual Activity: Not Currently   Other Topics Concern  . Not on file   Social History Narrative     BP 126/80 mmHg  Pulse 79  Ht 5' 1.5" (1.562 m)  Wt 128 lb (58.06 kg)  BMI 23.80 kg/m2  SpO2 94%  Physical Exam:  Well appearing 82 yo woman, NAD HEENT: Unremarkable Neck:  6 cm JVD, no thyromegally Back:  No CVA tenderness Lungs:  Clear  with no wheezes HEART:  Regular rate rhythm, no murmurs, no rubs, no clicks Abd:  soft, positive bowel sounds, no organomegally, no rebound, no guarding Ext:  2 plus pulses, no edema, no cyanosis,  no clubbing Skin:  No rashes no nodules Neuro:  CN II through XII intact, motor grossly intact   DEVICE  Normal device function.  See PaceArt for details. She is pacing in the ventricle 88% of the time.   Assess/Plan: 1. Atrial fib - her ventricular rate is well controlled. She will continue her current meds 2. HTN - her blood pressure is good today. She does note that at times she is high and I have asked that she take an extra clonidine as needed 3. CAD - she denies anginal symptoms. Will follow. 4. PPM - her Biotronik device is working normally and has over 11 years of battery longevity. Will recheck in several months.  Mikle Bosworth.D.

## 2015-07-19 LAB — CUP PACEART INCLINIC DEVICE CHECK
Lead Channel Impedance Value: 624 Ohm
Lead Channel Pacing Threshold Amplitude: 0.6 V
Lead Channel Sensing Intrinsic Amplitude: 13.3 mV
Lead Channel Setting Pacing Amplitude: 1.1 V
Lead Channel Setting Pacing Pulse Width: 0.4 ms
MDC IDC LEAD IMPLANT DT: 20150413
MDC IDC LEAD LOCATION: 753860
MDC IDC LEAD SERIAL: 29601447
MDC IDC MSMT BATTERY REMAINING LONGEVITY: 137 mo
MDC IDC MSMT BATTERY REMAINING PERCENTAGE: 85 %
MDC IDC MSMT LEADCHNL RV PACING THRESHOLD PULSEWIDTH: 0.4 ms
MDC IDC SESS DTM: 20170413101608
MDC IDC SET LEADCHNL RV SENSING SENSITIVITY: 2.5 mV
MDC IDC STAT BRADY RV PERCENT PACED: 88 %
Pulse Gen Serial Number: 68133668

## 2015-10-17 ENCOUNTER — Ambulatory Visit (INDEPENDENT_AMBULATORY_CARE_PROVIDER_SITE_OTHER): Payer: Medicare Other | Admitting: *Deleted

## 2015-10-17 DIAGNOSIS — I4891 Unspecified atrial fibrillation: Secondary | ICD-10-CM | POA: Diagnosis not present

## 2015-10-17 NOTE — Progress Notes (Signed)
Remote pacemaker transmission.   

## 2015-10-19 ENCOUNTER — Encounter: Payer: Self-pay | Admitting: Cardiology

## 2015-10-23 LAB — CUP PACEART REMOTE DEVICE CHECK
Brady Statistic RV Percent Paced: 97 %
Implantable Lead Implant Date: 20150413
Implantable Lead Serial Number: 29601447
Lead Channel Pacing Threshold Amplitude: 0.7 V
Lead Channel Pacing Threshold Pulse Width: 0.4 ms
Lead Channel Setting Pacing Amplitude: 1.1 V
MDC IDC LEAD LOCATION: 753860
MDC IDC MSMT LEADCHNL RV IMPEDANCE VALUE: 605 Ohm
MDC IDC MSMT LEADCHNL RV SENSING INTR AMPL: 11 mV
MDC IDC PG SERIAL: 68133668
MDC IDC SESS DTM: 20170718134710
MDC IDC SET LEADCHNL RV PACING PULSEWIDTH: 0.4 ms
MDC IDC SET LEADCHNL RV SENSING SENSITIVITY: 2.5 mV

## 2016-01-16 ENCOUNTER — Ambulatory Visit (INDEPENDENT_AMBULATORY_CARE_PROVIDER_SITE_OTHER): Payer: Medicare Other | Admitting: *Deleted

## 2016-01-16 DIAGNOSIS — I482 Chronic atrial fibrillation, unspecified: Secondary | ICD-10-CM

## 2016-01-18 ENCOUNTER — Encounter: Payer: Self-pay | Admitting: Cardiology

## 2016-01-18 NOTE — Progress Notes (Signed)
Remote pacemaker transmission.   

## 2016-02-13 ENCOUNTER — Ambulatory Visit: Payer: Medicare Other | Admitting: Orthopaedic Surgery

## 2016-02-15 LAB — CUP PACEART REMOTE DEVICE CHECK
Date Time Interrogation Session: 20171110145103
Lead Channel Impedance Value: 624 Ohm
Lead Channel Pacing Threshold Amplitude: 0.6 V
Lead Channel Pacing Threshold Pulse Width: 0.4 ms
MDC IDC LEAD IMPLANT DT: 20150413
MDC IDC LEAD LOCATION: 753860
MDC IDC LEAD SERIAL: 29601447
MDC IDC PG IMPLANT DT: 20150413
MDC IDC PG SERIAL: 68133668
MDC IDC STAT BRADY RV PERCENT PACED: 98 %

## 2016-04-21 ENCOUNTER — Ambulatory Visit (INDEPENDENT_AMBULATORY_CARE_PROVIDER_SITE_OTHER): Payer: Medicare Other | Admitting: *Deleted

## 2016-04-21 DIAGNOSIS — R001 Bradycardia, unspecified: Secondary | ICD-10-CM | POA: Diagnosis not present

## 2016-04-21 LAB — CUP PACEART REMOTE DEVICE CHECK
Brady Statistic RV Percent Paced: 98 %
Implantable Lead Implant Date: 20150413
Implantable Lead Serial Number: 29601447
Implantable Pulse Generator Implant Date: 20150413
Lead Channel Pacing Threshold Amplitude: 0.6 V
Lead Channel Pacing Threshold Pulse Width: 0.4 ms
Lead Channel Sensing Intrinsic Amplitude: 13 mV
Lead Channel Setting Sensing Sensitivity: 2.5 mV
MDC IDC LEAD LOCATION: 753860
MDC IDC MSMT LEADCHNL RV IMPEDANCE VALUE: 624 Ohm
MDC IDC PG SERIAL: 68133668
MDC IDC SESS DTM: 20180115175019
MDC IDC SET LEADCHNL RV PACING AMPLITUDE: 1.1 V
MDC IDC SET LEADCHNL RV PACING PULSEWIDTH: 0.4 ms

## 2016-04-21 NOTE — Progress Notes (Signed)
Remote pacemaker transmission.   

## 2016-04-22 ENCOUNTER — Telehealth: Payer: Self-pay

## 2016-04-22 NOTE — Telephone Encounter (Signed)
Patient called in to discuss getting her remote transmissions over to her [place]. I explained that her transmission has not processed (per Fabio Asa RN) yet and that her [place] cardiologist office will be able to view it tomorrow. She stated her understanding and that she was going to call [facility] because she is concerned about her blood pressure and then patient hung up.

## 2016-04-30 ENCOUNTER — Emergency Department (HOSPITAL_COMMUNITY): Payer: Medicare Other

## 2016-04-30 ENCOUNTER — Emergency Department (HOSPITAL_COMMUNITY)
Admission: EM | Admit: 2016-04-30 | Discharge: 2016-04-30 | Disposition: A | Discharge status: Home / Self Care | Payer: Medicare Other | Attending: Emergency Medicine | Admitting: Emergency Medicine

## 2016-04-30 ENCOUNTER — Encounter (HOSPITAL_COMMUNITY): Payer: Self-pay | Admitting: Emergency Medicine

## 2016-04-30 ENCOUNTER — Encounter: Payer: Self-pay | Admitting: Cardiology

## 2016-04-30 DIAGNOSIS — Z79899 Other long term (current) drug therapy: Secondary | ICD-10-CM | POA: Insufficient documentation

## 2016-04-30 DIAGNOSIS — Z7982 Long term (current) use of aspirin: Secondary | ICD-10-CM | POA: Diagnosis not present

## 2016-04-30 DIAGNOSIS — R0602 Shortness of breath: Secondary | ICD-10-CM | POA: Insufficient documentation

## 2016-04-30 DIAGNOSIS — R42 Dizziness and giddiness: Secondary | ICD-10-CM | POA: Diagnosis not present

## 2016-04-30 DIAGNOSIS — I2510 Atherosclerotic heart disease of native coronary artery without angina pectoris: Secondary | ICD-10-CM | POA: Insufficient documentation

## 2016-04-30 DIAGNOSIS — I10 Essential (primary) hypertension: Secondary | ICD-10-CM | POA: Insufficient documentation

## 2016-04-30 DIAGNOSIS — Z7901 Long term (current) use of anticoagulants: Secondary | ICD-10-CM | POA: Insufficient documentation

## 2016-04-30 DIAGNOSIS — Z950 Presence of cardiac pacemaker: Secondary | ICD-10-CM | POA: Diagnosis not present

## 2016-04-30 DIAGNOSIS — Z8542 Personal history of malignant neoplasm of other parts of uterus: Secondary | ICD-10-CM | POA: Diagnosis not present

## 2016-04-30 DIAGNOSIS — R55 Syncope and collapse: Secondary | ICD-10-CM | POA: Diagnosis present

## 2016-04-30 LAB — I-STAT CHEM 8, ED
BUN: 13 mg/dL (ref 6–20)
CHLORIDE: 100 mmol/L — AB (ref 101–111)
Calcium, Ion: 1.24 mmol/L (ref 1.15–1.40)
Creatinine, Ser: 0.8 mg/dL (ref 0.44–1.00)
GLUCOSE: 120 mg/dL — AB (ref 65–99)
HCT: 46 % (ref 36.0–46.0)
Hemoglobin: 15.6 g/dL — ABNORMAL HIGH (ref 12.0–15.0)
Potassium: 4 mmol/L (ref 3.5–5.1)
Sodium: 138 mmol/L (ref 135–145)
TCO2: 26 mmol/L (ref 0–100)

## 2016-04-30 NOTE — ED Triage Notes (Signed)
Pt states she has been dizzy today, states she has not felt 100 % lately and related that to her blood pressure being up.  Pt denies pain or other complaint, states she is feeling a little better now.

## 2016-04-30 NOTE — ED Provider Notes (Signed)
[facility] DEPT Provider Note   CSN: [id number] Arrival date & time: 04/30/16  1905 By signing my name below, I, Dyke Brackett, attest that this documentation has been prepared under the direction and in the presence of Merrily Pew, MD . Electronically Signed: Dyke Brackett, Scribe. 04/30/2016. 8:23 PM.   History   Chief Complaint Chief Complaint  Patient presents with  . Dizziness   HPI Karina [name] is a 83 y.o. female with a history of HTN, hypertensive urgency, A-fib, and CAD who presents to the Emergency Department complaining of sudden onset, waxing and waning dizziness today. She describes this as feeling near-syncope. Pt was seen at her PCP for this and was instructed to take clonidine which she has taken with some relief. Pt reports some associated SOB this afternoon which has resolved and generalized weakness. She states "I just kept feeling worse." Pt states her symptoms have improved while in the ED. Per pt, her blood pressure has been elevated for several months. Pt states she typically feels generalized malaise when her blood pressure is elevated, but reports feeling dizzy for the first time last week.  She denies any CP, palpitations, leg swelling, arthralgias, myalgias, or any other associated symptoms. She is followed by Samella Parr at [facility] cardiology and has an appointment on 05/05/16.   The history is provided by the patient. No language interpreter was used.   Past Medical History:  Diagnosis Date  . Actinic keratosis   . Bilateral leg weakness 09/27/12  . CAD (coronary artery disease)    EF 55% by 2 D Echo 2008 and 2012  . Chronic atrial fibrillation ([place])   . Chronic edema 10/15/11  . Chronic fatigue   . DDD (degenerative disc disease)   . Encounter for long-term (current) use of other medications 09/02/12  . Endometrial carcinoma ([place])   . Fibromuscular dysplasia (HCC)    S/P PCI right renal aretery 1999  . GERD (gastroesophageal reflux disease)   . Hip  fracture, right ([place]) 08/06/06   S/P surgery  . Hx of cardiovascular stress test 09/2010   Nuclear stress testing showing no evidence of ischemia, EF=76%, No EKG changes & no perfusion defects.  Marland Kitchen Hx of echocardiogram 05/2012    EF >55%, LA 3.9 cm, mild LVH  . Hyperlipidemia   . Hypertension    Difficult to control  . Hyponatremia    Hypomatremia/SIADH, possibly secondary to Tegretol  . LVH (left ventricular hypertrophy)    Mild  . Osteoarthritis   . Osteoporosis    With Hx of thoracic compression fractures  . Pacemaker   . Paroxysmal atrial fibrillation (HCC)   . Peripheral neuropathy ([place])   . Peripheral vascular disease ([place])   . Pre-diabetes 01/21/11   Chronic  . Renovascular hypertension   . Tachy-brady syndrome ([place])   . Thoracic compression fracture ([place])   . TIA (transient ischemic attack) 08/2011   OSH: flushing, left LE numbness, CT negative  . Tic douloureux 1994   S/P successful surgery    Patient Active Problem List   Diagnosis Date Noted  . Hypertensive urgency 01/18/2015  . Physical deconditioning 01/18/2015  . Ataxia 01/18/2015  . Tachy-brady syndrome ([place]) 01/18/2015  . Chronic a-fib ([place]) 01/18/2015  . Dependent edema 01/18/2015  . HLD (hyperlipidemia) 01/18/2015  . Dyspnea 07/14/2014  . Hyponatremia 01/29/2014  . Generalized weakness 01/29/2014  . Atrial fibrillation ([place]) 09/22/2013  . Symptomatic bradycardia 09/22/2013  . Atherosclerosis of native coronary artery 09/22/2013  . Essential hypertension 09/22/2013  .  Dyslipidemia 09/22/2013  . Cardiac pacemaker in situ 09/22/2013    Past Surgical History:  Procedure Laterality Date  . ABDOMINAL AORTAGRAM  09/05/01   Right & left renals 25%  . APPENDECTOMY  1946  . CARDIAC CATHETERIZATION  11/05/01   EF 72%. RCA 25%. LCX 50%. Ramus 75/100%. LAD 95%. D2 75%.  . [place]  . [place]  . CHOLECYSTECTOMY  1989  . CORONARY ANGIOPLASTY    . CORONARY ANGIOPLASTY WITH STENT  PLACEMENT  11/05/01   PTCA/Stent to 95% mid LAD, Ramus lesion could not be crossed and was not treated.   Marland Kitchen DIPYRIDAMOLE STRESS TEST  09/2010   Nuclear stress testing showing no evidence of ischemia, EF=76%, No EKG changes & no perfusion defects.  . LUMBAR [place]    . RENAL ANGIOGRAM  09/14/97   25% diffuse left renal artery. 50% ostia; right renal artery.  Marland Kitchen RENAL ARTERY ANGIOPLASTY Right 1994   Renal artery stenosis secondary to fibromusclar dysplasia  . RENAL ARTERY ANGIOPLASTY Right 09/13/97   PTA/Stent to ostial right renal artery, No distal fibromuscular hyperplasia  . SPINAL FUSION  2002   Percutaneous spinal fusion  . TOTAL ABDOMINAL HYSTERECTOMY W/ BILATERAL SALPINGOOPHORECTOMY  1990  . VAGINAL HYSTERECTOMY  1990    OB History    Gravida Para Term Preterm AB Living   2 2 2     2    SAB TAB Ectopic Multiple Live Births                   Home Medications    Prior to Admission medications   Medication Sig Start Date End Date Taking? Authorizing Provider  aspirin EC 81 MG tablet Take 81 mg by mouth daily.   Yes Historical Provider, MD  calcium carbonate (TUMS) 500 MG chewable tablet Chew 3 tablets by mouth daily.   Yes Historical Provider, MD  cloNIDine (CATAPRES) 0.1 MG tablet Take 0.1-0.2 mg by mouth 2 (two) times daily. Take 1 tablet in the morning and 2 tablets at night 05/07/09  Yes Historical Provider, MD  cycloSPORINE (RESTASIS) 0.05 % ophthalmic emulsion Place 1 drop into both eyes 2 (two) times daily.   Yes Historical Provider, MD  diltiazem (CARDIZEM CD) 120 MG 24 hr capsule Take 120 mg by mouth 2 (two) times daily. 11/15/14  Yes Historical Provider, MD  Ergocalciferol (VITAMIN D2) 2000 UNITS TABS Take 1 tablet by mouth daily.    Yes Historical Provider, MD  metoprolol tartrate (LOPRESSOR) 25 MG tablet Take 25 mg by mouth 2 (two) times daily.    Yes Historical Provider, MD  pantoprazole (PROTONIX) 40 MG tablet Take 40 mg by mouth daily.   Yes Historical Provider, MD    rosuvastatin (CRESTOR) 5 MG tablet Take 5 mg by mouth daily.    Yes Historical Provider, MD  valsartan (DIOVAN) 320 MG tablet Take 320 mg by mouth daily. 04/10/10  Yes Historical Provider, MD  warfarin (COUMADIN) 2 MG tablet Take 2-4 mg by mouth See admin instructions. 4mg  on Sunday and 2mg  all other days.   Yes Historical Provider, MD  nitroGLYCERIN (NITROSTAT) 0.4 MG SL tablet Place 0.4 mg under the tongue every 5 (five) minutes as needed for chest pain.  04/10/10   Historical Provider, MD  traMADol (ULTRAM) 50 MG tablet Take 50 mg by mouth every 6 (six) hours as needed for moderate pain.     Historical Provider, MD    Family History Family History  Problem  Relation Age of Onset  . COPD Brother   . Diabetes Brother     DM  . Hypertension Brother   . Stroke Father   . Stroke Other     Social History Social History  Substance Use Topics  . Smoking status: Never Smoker  . Smokeless tobacco: Never Used  . Alcohol use 0.0 oz/week     Comment: Rarely     Allergies   Amiodarone; Ace inhibitors; Meperidine and related; and Tikosyn [dofetilide]   Review of Systems Review of Systems  Respiratory: Positive for shortness of breath.   Cardiovascular: Negative for chest pain, palpitations and leg swelling.  Musculoskeletal: Negative for arthralgias and myalgias.  Neurological: Positive for dizziness and weakness.  All other systems reviewed and are negative.  Physical Exam Updated Vital Signs BP 138/94   Pulse 75   Resp 13   Ht 5' 1.5" (1.562 m)   Wt 125 lb (56.7 kg)   SpO2 97%   BMI 23.24 kg/m   Physical Exam  Constitutional: She is oriented to person, place, and time. She appears well-developed and well-nourished. No distress.  HENT:  Head: Normocephalic and atraumatic.  Eyes: Conjunctivae are normal.  Cardiovascular: Normal rate.   Slightly irregular but otherwise normal  Pulmonary/Chest: Effort normal.  Abdominal: Soft. She exhibits no distension.  Musculoskeletal:   Decreased plantar flexion, but has braces on. Decreased patellar reflexes.   Neurological: She is alert and oriented to person, place, and time. No cranial nerve deficit or sensory deficit. She exhibits normal muscle tone. Coordination normal.  Good strength. Good brachioradialis reflexes.  Skin: Skin is warm and dry.  Psychiatric: She has a normal mood and affect.  Nursing note and vitals reviewed.  ED Treatments / Results  DIAGNOSTIC STUDIES:  Oxygen Saturation is 98% on RA, normal by my interpretation.    COORDINATION OF CARE:  8:18 PM Discussed treatment plan with pt at bedside and pt agreed to plan.   Labs (all labs ordered are listed, but only abnormal results are displayed) Labs Reviewed  I-STAT CHEM 8, ED - Abnormal; Notable for the following:       Result Value   Chloride 100 (*)    Glucose, Bld 120 (*)    Hemoglobin 15.6 (*)    All other components within normal limits    EKG  EKG Interpretation None       Radiology Dg Chest 2 View  Result Date: 04/30/2016 CLINICAL DATA:  Dizziness with hypertension. EXAM: CHEST  2 VIEW COMPARISON:  01/18/2015 FINDINGS: Lungs are hyperexpanded. The lungs are clear wiithout focal pneumonia, edema, pneumothorax or pleural effusion. Cardiopericardial silhouette is at upper limits of normal for size. Left-sided single lead pacemaker remains in place. The visualized bony structures of the thorax are intact. Telemetry leads overlie the chest. IMPRESSION: Stable exam.  Hyperexpansion without acute cardiopulmonary findings. Electronically Signed   By: Misty Stanley M.D.   On: 04/30/2016 21:47    Procedures Procedures (including critical care time)  Medications Ordered in ED Medications - No data to display   Initial Impression / Assessment and Plan / ED Course  I have reviewed the triage vital signs and the nursing notes.  Pertinent labs & imaging results that were available during my care of the patient were reviewed by me and  considered in my medical decision making (see chart for details).     HTN likely as cause for her dizziness. No symptoms here. Workup negative. BP stable and normal whole time  here. Normal neuro/cardiac/pulmonary exam. Will continue to work on better BP control with her cardiologist and PCP.   Final Clinical Impressions(s) / ED Diagnoses   Final diagnoses:  Hypertension, unspecified type  Dizziness    I personally performed the services described in this documentation, which was scribed in my presence. The recorded information has been reviewed and is accurate.    Merrily Pew, MD 04/30/16 [id number]

## 2016-08-04 ENCOUNTER — Ambulatory Visit (INDEPENDENT_AMBULATORY_CARE_PROVIDER_SITE_OTHER): Payer: Medicare Other | Admitting: Internal Medicine

## 2016-08-04 ENCOUNTER — Encounter: Payer: Self-pay | Admitting: Internal Medicine

## 2016-08-04 DIAGNOSIS — I4821 Permanent atrial fibrillation: Secondary | ICD-10-CM

## 2016-08-04 DIAGNOSIS — I482 Chronic atrial fibrillation: Secondary | ICD-10-CM | POA: Diagnosis not present

## 2016-08-04 LAB — CUP PACEART INCLINIC DEVICE CHECK
Battery Remaining Longevity: 126 mo
Brady Statistic RV Percent Paced: 91 %
Implantable Lead Location: 753860
Lead Channel Pacing Threshold Amplitude: 0.5 V
Lead Channel Pacing Threshold Pulse Width: 0.4 ms
Lead Channel Setting Pacing Amplitude: 1 V
Lead Channel Setting Pacing Pulse Width: 0.4 ms
MDC IDC LEAD IMPLANT DT: 20150413
MDC IDC LEAD SERIAL: 29601447
MDC IDC MSMT LEADCHNL RV IMPEDANCE VALUE: 604 Ohm
MDC IDC MSMT LEADCHNL RV PACING THRESHOLD AMPLITUDE: 0.5 V
MDC IDC MSMT LEADCHNL RV PACING THRESHOLD PULSEWIDTH: 0.4 ms
MDC IDC MSMT LEADCHNL RV SENSING INTR AMPL: 14.4 mV
MDC IDC MSMT LEADCHNL RV SENSING INTR AMPL: 14.4 mV
MDC IDC PG IMPLANT DT: 20150413
MDC IDC PG SERIAL: 68133668
MDC IDC SESS DTM: 20180430111234
MDC IDC SET LEADCHNL RV SENSING SENSITIVITY: 2.5 mV

## 2016-08-04 NOTE — Patient Instructions (Signed)

## 2016-08-04 NOTE — Progress Notes (Signed)
HPI Karina [name] returns today for ongoing treatment of her atrial fibrillation. She is a very pleasant 83 yo woman with a h/o atrial fibrillation, symptomatic bradycardia, CAD, s/p PTCA and stenting of her LAD remotely, who developed atrial fibrillation a couple of years ago which has no become chronic. She was tried on both Tikosyn and amiodarone without success and is now on a strategy of rate control. She denies peripheral edema or syncope. She has peripheral neuropathy. She had a Biotronik DDD PM placed. In the interim, she has had some problems with her peripheral neuropathy and has become a bit less active.  Allergies  Allergen Reactions  . Amiodarone Other (See Comments)    Worsening peripheral neuropathy  . Ace Inhibitors Hives  . Meperidine And Related Other (See Comments)    Unknown  . Tikosyn [Dofetilide] Other (See Comments)    QT prolongation     Current Outpatient Prescriptions  Medication Sig Dispense Refill  . aspirin EC 81 MG tablet Take 81 mg by mouth daily.    . calcium carbonate (TUMS) 500 MG chewable tablet Chew 3 tablets by mouth daily.    . carvedilol (COREG) 3.125 MG tablet Take 3.125 mg by mouth 2 (two) times daily.    . cloNIDine (CATAPRES) 0.1 MG tablet Take 0.1-0.2 mg by mouth 2 (two) times daily. Take 1 tablet in the morning and 2 tablets at night    . cycloSPORINE (RESTASIS) 0.05 % ophthalmic emulsion Place 1 drop into both eyes 2 (two) times daily.    Marland Kitchen diltiazem (CARDIZEM CD) 120 MG 24 hr capsule Take 120 mg by mouth 2 (two) times daily.    . Ergocalciferol (VITAMIN D2) 2000 UNITS TABS Take 1 tablet by mouth daily.     . nitroGLYCERIN (NITROSTAT) 0.4 MG SL tablet Place 0.4 mg under the tongue every 5 (five) minutes as needed for chest pain.     . pantoprazole (PROTONIX) 40 MG tablet Take 40 mg by mouth daily.    . rosuvastatin (CRESTOR) 5 MG tablet Take 5 mg by mouth daily.     . traMADol (ULTRAM) 50 MG tablet Take 50 mg by mouth every 6 (six)  hours as needed for moderate pain.     . valsartan (DIOVAN) 320 MG tablet Take 320 mg by mouth daily.    Marland Kitchen warfarin (COUMADIN) 2 MG tablet Take 2-4 mg by mouth See admin instructions. 4mg  on Sunday and 2mg  all other days.     No current facility-administered medications for this visit.      Past Medical History:  Diagnosis Date  . Actinic keratosis   . Bilateral leg weakness 09/27/12  . CAD (coronary artery disease)    EF 55% by 2 D Echo 2008 and 2012  . Chronic atrial fibrillation ([place])   . Chronic edema 10/15/11  . Chronic fatigue   . DDD (degenerative disc disease)   . Encounter for long-term (current) use of other medications 09/02/12  . Endometrial carcinoma ([place])   . Fibromuscular dysplasia (HCC)    S/P PCI right renal aretery 1999  . GERD (gastroesophageal reflux disease)   . Hip fracture, right ([place]) 08/06/06   S/P surgery  . Hx of cardiovascular stress test 09/2010   Nuclear stress testing showing no evidence of ischemia, EF=76%, No EKG changes & no perfusion defects.  Marland Kitchen Hx of echocardiogram 05/2012    EF >55%, LA 3.9 cm, mild LVH  . Hyperlipidemia   . Hypertension  Difficult to control  . Hyponatremia    Hypomatremia/SIADH, possibly secondary to Tegretol  . LVH (left ventricular hypertrophy)    Mild  . Osteoarthritis   . Osteoporosis    With Hx of thoracic compression fractures  . Pacemaker   . Paroxysmal atrial fibrillation (HCC)   . Peripheral neuropathy   . Peripheral vascular disease ([place])   . Pre-diabetes 01/21/11   Chronic  . Renovascular hypertension   . Tachy-brady syndrome ([place])   . Thoracic compression fracture ([place])   . TIA (transient ischemic attack) 08/2011   OSH: flushing, left LE numbness, CT negative  . Tic douloureux 1994   S/P successful surgery    ROS:   All systems reviewed and negative except as noted in the HPI.   Past Surgical History:  Procedure Laterality Date  . ABDOMINAL AORTAGRAM  09/05/01   Right & left renals 25%  .  APPENDECTOMY  1946  . CARDIAC CATHETERIZATION  11/05/01   EF 72%. RCA 25%. LCX 50%. Ramus 75/100%. LAD 95%. D2 75%.  . [place]  . [place]  . CHOLECYSTECTOMY  1989  . CORONARY ANGIOPLASTY    . CORONARY ANGIOPLASTY WITH STENT PLACEMENT  11/05/01   PTCA/Stent to 95% mid LAD, Ramus lesion could not be crossed and was not treated.   Marland Kitchen DIPYRIDAMOLE STRESS TEST  09/2010   Nuclear stress testing showing no evidence of ischemia, EF=76%, No EKG changes & no perfusion defects.  . LUMBAR [place]    . RENAL ANGIOGRAM  09/14/97   25% diffuse left renal artery. 50% ostia; right renal artery.  Marland Kitchen RENAL ARTERY ANGIOPLASTY Right 1994   Renal artery stenosis secondary to fibromusclar dysplasia  . RENAL ARTERY ANGIOPLASTY Right 09/13/97   PTA/Stent to ostial right renal artery, No distal fibromuscular hyperplasia  . SPINAL FUSION  2002   Percutaneous spinal fusion  . TOTAL ABDOMINAL HYSTERECTOMY W/ BILATERAL SALPINGOOPHORECTOMY  1990  . VAGINAL HYSTERECTOMY  1990     Family History  Problem Relation Age of Onset  . COPD Brother   . Diabetes Brother     DM  . Hypertension Brother   . Stroke Father   . Stroke Other      Social History   Social History  . Marital status: Widowed    Spouse name: N/A  . Number of children: N/A  . Years of education: N/A   Occupational History  . Not on file.   Social History Main Topics  . Smoking status: Never Smoker  . Smokeless tobacco: Never Used  . Alcohol use 0.0 oz/week     Comment: Rarely  . Drug use: No  . Sexual activity: Not Currently   Other Topics Concern  . Not on file   Social History Narrative  . No narrative on file     BP 138/82   Pulse 74   Ht 5' 1.5" (1.562 m)   Wt 128 lb (58.1 kg)   SpO2 98%   BMI 23.79 kg/m   Physical Exam:  Well appearing 83 yo woman, NAD HEENT: Unremarkable Neck:  6 cm JVD, no thyromegally Back:  No CVA tenderness Lungs:  Clear with no wheezes HEART:  Regular rate  rhythm, no murmurs, no rubs, no clicks Abd:  soft, positive bowel sounds, no organomegally, no rebound, no guarding Ext:  2 plus pulses, no edema, no cyanosis, no clubbing Skin:  No rashes no nodules Neuro:  CN II through XII intact, motor grossly  intact   DEVICE  Normal device function.  See PaceArt for details. She is pacing in the ventricle 88% of the time.   Assess/Plan: 1. Atrial fib - her ventricular rate is well controlled. She will continue her current meds 2. HTN - her blood pressure is minimally elevated today. She does note that at times she is high and she will take an extra clonidine as needed 3. CAD - she denies anginal symptoms. Will follow. 4. PPM - her Biotronik device is working normally and has over 10 years of battery longevity. Will recheck in several months.  Mikle Bosworth.D.

## 2016-11-03 ENCOUNTER — Ambulatory Visit (INDEPENDENT_AMBULATORY_CARE_PROVIDER_SITE_OTHER): Payer: Medicare Other | Admitting: *Deleted

## 2016-11-03 DIAGNOSIS — R001 Bradycardia, unspecified: Secondary | ICD-10-CM

## 2016-11-03 NOTE — Progress Notes (Signed)
Remote pacemaker transmission.   

## 2016-11-07 ENCOUNTER — Encounter: Payer: Self-pay | Admitting: Cardiology

## 2016-11-18 ENCOUNTER — Emergency Department (HOSPITAL_COMMUNITY)
Admission: EM | Admit: 2016-11-18 | Discharge: 2016-11-18 | Disposition: A | Discharge status: Home / Self Care | Payer: Medicare Other | Attending: Emergency Medicine | Admitting: Emergency Medicine

## 2016-11-18 ENCOUNTER — Encounter (HOSPITAL_COMMUNITY): Payer: Self-pay | Admitting: *Deleted

## 2016-11-18 DIAGNOSIS — I10 Essential (primary) hypertension: Secondary | ICD-10-CM | POA: Insufficient documentation

## 2016-11-18 DIAGNOSIS — Z955 Presence of coronary angioplasty implant and graft: Secondary | ICD-10-CM | POA: Diagnosis not present

## 2016-11-18 DIAGNOSIS — Z7901 Long term (current) use of anticoagulants: Secondary | ICD-10-CM | POA: Insufficient documentation

## 2016-11-18 DIAGNOSIS — Z79899 Other long term (current) drug therapy: Secondary | ICD-10-CM | POA: Diagnosis not present

## 2016-11-18 DIAGNOSIS — Z7982 Long term (current) use of aspirin: Secondary | ICD-10-CM | POA: Insufficient documentation

## 2016-11-18 DIAGNOSIS — Z950 Presence of cardiac pacemaker: Secondary | ICD-10-CM | POA: Diagnosis not present

## 2016-11-18 DIAGNOSIS — C541 Malignant neoplasm of endometrium: Secondary | ICD-10-CM | POA: Diagnosis not present

## 2016-11-18 DIAGNOSIS — I2510 Atherosclerotic heart disease of native coronary artery without angina pectoris: Secondary | ICD-10-CM | POA: Insufficient documentation

## 2016-11-18 DIAGNOSIS — R531 Weakness: Secondary | ICD-10-CM | POA: Diagnosis not present

## 2016-11-18 LAB — CBC
HCT: 41.7 % (ref 36.0–46.0)
Hemoglobin: 14.1 g/dL (ref 12.0–15.0)
MCH: 29.6 pg (ref 26.0–34.0)
MCHC: 33.8 g/dL (ref 30.0–36.0)
MCV: 87.6 fL (ref 78.0–100.0)
Platelets: 257 10*3/uL (ref 150–400)
RBC: 4.76 MIL/uL (ref 3.87–5.11)
RDW: 13.2 % (ref 11.5–15.5)
WBC: 8.5 10*3/uL (ref 4.0–10.5)

## 2016-11-18 LAB — BASIC METABOLIC PANEL
Anion gap: 10 (ref 5–15)
BUN: 12 mg/dL (ref 6–20)
CALCIUM: 9.4 mg/dL (ref 8.9–10.3)
CO2: 26 mmol/L (ref 22–32)
Chloride: 92 mmol/L — ABNORMAL LOW (ref 101–111)
Creatinine, Ser: 0.78 mg/dL (ref 0.44–1.00)
GFR calc Af Amer: >60 mL/min (ref >60)
GLUCOSE: 97 mg/dL (ref 65–99)
Potassium: 4.1 mmol/L (ref 3.5–5.1)
Sodium: 128 mmol/L — ABNORMAL LOW (ref 135–145)

## 2016-11-18 LAB — URINALYSIS, ROUTINE W REFLEX MICROSCOPIC
BACTERIA UA: NONE SEEN
BILIRUBIN URINE: NEGATIVE
Glucose, UA: NEGATIVE mg/dL
HGB URINE DIPSTICK: NEGATIVE
KETONES UR: NEGATIVE mg/dL
NITRITE: NEGATIVE
PH: 7 (ref 5.0–8.0)
PROTEIN: NEGATIVE mg/dL
SPECIFIC GRAVITY, URINE: 1.005 (ref 1.005–1.030)
Squamous Epithelial / LPF: NONE SEEN

## 2016-11-18 LAB — CBG MONITORING, ED: Glucose-Capillary: 95 mg/dL (ref 65–99)

## 2016-11-18 LAB — TROPONIN I: Troponin I: <0.03 ng/mL (ref <0.03)

## 2016-11-18 NOTE — Discharge Instructions (Signed)
Your tests were good. Recommend taking one half the dose of your new blood pressure medicine. Follow-up with your primary care doctor.

## 2016-11-18 NOTE — ED Triage Notes (Signed)
Pt c/o weakness x 4 days. Pt was recently changed to a new BP medication on Aug. 4. Pt reports bilateral arms, hands and legs itching x several days. Pt saw PCP for this and was given prescription for a cream.

## 2016-11-19 NOTE — ED Provider Notes (Signed)
[facility] DEPT Provider Note   CSN: [id number] Arrival date & time: 11/18/16  1444     History   Chief Complaint Chief Complaint  Patient presents with  . Weakness    HPI Karina [name] is a 83 y.o. female.  Patient complains of generalized weakness for 3-4 days. No specific chest pain, dyspnea, fever, sweats,dysuria, change in appetite, cough.she was recently started on a new dose of blood pressure medicine called candesartan.  She thinks this may be the cause of her symptoms. She is able to do her activities of daily living. She walked into the emergency department.      Past Medical History:  Diagnosis Date  . Actinic keratosis   . Bilateral leg weakness 09/27/12  . CAD (coronary artery disease)    EF 55% by 2 D Echo 2008 and 2012  . Chronic atrial fibrillation ([place])   . Chronic edema 10/15/11  . Chronic fatigue   . DDD (degenerative disc disease)   . Encounter for long-term (current) use of other medications 09/02/12  . Endometrial carcinoma ([place])   . Fibromuscular dysplasia (HCC)    S/P PCI right renal aretery 1999  . GERD (gastroesophageal reflux disease)   . Hip fracture, right ([place]) 08/06/06   S/P surgery  . Hx of cardiovascular stress test 09/2010   Nuclear stress testing showing no evidence of ischemia, EF=76%, No EKG changes & no perfusion defects.  Marland Kitchen Hx of echocardiogram 05/2012    EF >55%, LA 3.9 cm, mild LVH  . Hyperlipidemia   . Hypertension    Difficult to control  . Hyponatremia    Hypomatremia/SIADH, possibly secondary to Tegretol  . LVH (left ventricular hypertrophy)    Mild  . Osteoarthritis   . Osteoporosis    With Hx of thoracic compression fractures  . Pacemaker   . Paroxysmal atrial fibrillation (HCC)   . Peripheral neuropathy   . Peripheral vascular disease ([place])   . Pre-diabetes 01/21/11   Chronic  . Renovascular hypertension   . Tachy-brady syndrome ([place])   . Thoracic compression fracture ([place])   . TIA (transient ischemic  attack) 08/2011   OSH: flushing, left LE numbness, CT negative  . Tic dou[place]oureux 1994   S/P successful surgery    Patient Active Problem List   Diagnosis Date Noted  . Hypertensive urgency 01/18/2015  . Physical deconditioning 01/18/2015  . Ataxia 01/18/2015  . Tachy-brady syndrome ([place]) 01/18/2015  . Chronic a-fib ([place]) 01/18/2015  . Dependent edema 01/18/2015  . HLD (hyperlipidemia) 01/18/2015  . Dyspnea 07/14/2014  . Hyponatremia 01/29/2014  . Generalized weakness 01/29/2014  . Atrial fibrillation ([place]) 09/22/2013  . Symptomatic bradycardia 09/22/2013  . Atherosclerosis of native coronary artery 09/22/2013  . Essential hypertension 09/22/2013  . Dyslipidemia 09/22/2013  . Cardiac pacemaker in situ 09/22/2013    Past Surgical History:  Procedure Laterality Date  . ABDOMINAL AORTAGRAM  09/05/01   Right & left renals 25%  . APPENDECTOMY  1946  . CARDIAC CATHETERIZATION  11/05/01   EF 72%. RCA 25%. LCX 50%. Ramus 75/100%. LAD 95%. D2 75%.  . [place]  . [place]  . CHOLECYSTECTOMY  1989  . CORONARY ANGIOPLASTY    . CORONARY ANGIOPLASTY WITH STENT PLACEMENT  11/05/01   PTCA/Stent to 95% mid LAD, Ramus lesion could not be crossed and was not treated.   Marland Kitchen DIPYRIDAMOLE STRESS TEST  09/2010   Nuclear stress testing showing no evidence of ischemia, EF=76%, No EKG changes &  no perfusion defects.  . LUMBAR [place]    . RENAL ANGIOGRAM  09/14/97   25% diffuse left renal artery. 50% ostia; right renal artery.  Marland Kitchen RENAL ARTERY ANGIOPLASTY Right 1994   Renal artery stenosis secondary to fibromusclar dysplasia  . RENAL ARTERY ANGIOPLASTY Right 09/13/97   PTA/Stent to ostial right renal artery, No distal fibromuscular hyperplasia  . SPINAL FUSION  2002   Percutaneous spinal fusion  . TOTAL ABDOMINAL HYSTERECTOMY W/ BILATERAL SALPINGOOPHORECTOMY  1990  . VAGINAL HYSTERECTOMY  1990    OB History    Gravida Para Term Preterm AB Living   2 2 2     2    SAB  TAB Ectopic Multiple Live Births                   Home Medications    Prior to Admission medications   Medication Sig Start Date End Date Taking? Authorizing Provider  aspirin EC 81 MG tablet Take 81 mg by mouth daily.    [provider]  calcium carbonate (TUMS) 500 MG chewable tablet Chew 3 tablets by mouth daily.    [provider]  carvedilol (COREG) 3.125 MG tablet Take 3.125 mg by mouth 2 (two) times daily. 05/05/16 05/05/17  [provider]  cloNIDine (CATAPRES) 0.1 MG tablet Take 0.1-0.2 mg by mouth 2 (two) times daily. Take 1 tablet in the morning and 2 tablets at night 05/07/09   [provider]  cycloSPORINE (RESTASIS) 0.05 % ophthalmic emulsion Place 1 drop into both eyes 2 (two) times daily.    [provider]  diltiazem (CARDIZEM CD) 120 MG 24 hr capsule Take 120 mg by mouth 2 (two) times daily. 11/15/14   [provider]  Ergocalciferol (VITAMIN D2) 2000 UNITS TABS Take 1 tablet by mouth daily.     [provider]  nitroGLYCERIN (NITROSTAT) 0.4 MG SL tablet Place 0.4 mg under the tongue every 5 (five) minutes as needed for chest pain.  04/10/10   [provider]  pantoprazole (PROTONIX) 40 MG tablet Take 40 mg by mouth daily.    [provider]  rosuvastatin (CRESTOR) 5 MG tablet Take 5 mg by mouth daily.     [provider]  traMADol (ULTRAM) 50 MG tablet Take 50 mg by mouth every 6 (six) hours as needed for moderate pain.     [provider]  valsartan (DIOVAN) 320 MG tablet Take 320 mg by mouth daily. 04/10/10   [provider]  warfarin (COUMADIN) 2 MG tablet Take 2-4 mg by mouth See admin instructions. 4mg  on Sunday and 2mg  all other days.    [provider]    Family History Family History  Problem Relation Age of Onset  . COPD Brother   . Diabetes Brother        DM  . Hypertension Brother   . Stroke Father   . Stroke Other     Social History Social  History  Substance Use Topics  . Smoking status: Never Smoker  . Smokeless tobacco: Never Used  . Alcohol use 0.0 oz/week     Comment: Rarely     Allergies   Amiodarone; Ace inhibitors; Meperidine and related; and Tikosyn [dofetilide]   Review of Systems Review of Systems  All other systems reviewed and are negative.    Physical Exam Updated Vital Signs BP (!) 175/98   Pulse 80   Temp 97.9 F (36.6 C) (Oral)   Resp 16  Ht 5\' 2"  (1.575 m)   Wt 56.7 kg (125 lb)   SpO2 98%   BMI 22.86 kg/m   Physical Exam  Constitutional: She is oriented to person, place, and time.  Appears well  HENT:  Head: Normocephalic and atraumatic.  Eyes: Conjunctivae are normal.  Neck: Neck supple.  Cardiovascular: Normal rate and regular rhythm.   Pulmonary/Chest: Effort normal and breath sounds normal.  Abdominal: Soft. Bowel sounds are normal.  Musculoskeletal: Normal range of motion.  Neurological: She is alert and oriented to person, place, and time.  Skin: Skin is warm and dry.  Psychiatric: She has a normal mood and affect. Her behavior is normal.  Nursing note and vitals reviewed.    ED Treatments / Results  Labs (all labs ordered are listed, but only abnormal results are displayed) Labs Reviewed  BASIC METABOLIC PANEL - Abnormal; Notable for the following:       Result Value   Sodium 128 (*)    Chloride 92 (*)    All other components within normal limits  URINALYSIS, ROUTINE W REFLEX MICROSCOPIC - Abnormal; Notable for the following:    Color, Urine STRAW (*)    Leukocytes, UA LARGE (*)    All other components within normal limits  CBC  TROPONIN I  CBG MONITORING, ED    EKG  EKG Interpretation  Date/Time:  Tuesday November 18 2016 15:01:03 EDT Ventricular Rate:  72 PR Interval:    QRS Duration: 148 QT Interval:  448 QTC Calculation: 490 R Axis:   119 Text Interpretation:  Ventricular-paced rhythm Abnormal ECG Confirmed by Nat Christen [phone]) on 11/18/2016  4:29:34 PM       Radiology No results found.  Procedures Procedures (including critical care time)  Medications Ordered in ED Medications - No data to display   Initial Impression / Assessment and Plan / ED Course  I have reviewed the triage vital signs and the nursing notes.  Pertinent labs & imaging results that were available during my care of the patient were reviewed by me and considered in my medical decision making (see chart for details).     Patient is in no acute distress.Recommended taking one half of her new dose of blood pressure medicine. She has primary care follow-up.  Final Clinical Impressions(s) / ED Diagnoses   Final diagnoses:  Weakness    New Prescriptions Discharge Medication List as of 11/18/2016  6:05 PM       Nat Christen, MD 11/19/16 [phone]

## 2016-12-09 LAB — CUP PACEART REMOTE DEVICE CHECK
Brady Statistic RV Percent Paced: 100 %
Implantable Lead Location: 753860
Implantable Pulse Generator Implant Date: 20150413
Lead Channel Impedance Value: 609 Ohm
Lead Channel Pacing Threshold Amplitude: 0.6 V
Lead Channel Setting Pacing Pulse Width: 0.4 ms
MDC IDC LEAD IMPLANT DT: 20150413
MDC IDC LEAD SERIAL: 29601447
MDC IDC MSMT LEADCHNL RV PACING THRESHOLD PULSEWIDTH: 0.4 ms
MDC IDC SESS DTM: 20180903054550
MDC IDC SET LEADCHNL RV SENSING SENSITIVITY: 2.5 mV
Pulse Gen Serial Number: 68133668

## 2016-12-22 ENCOUNTER — Ambulatory Visit (INDEPENDENT_AMBULATORY_CARE_PROVIDER_SITE_OTHER): Payer: Medicare Other | Admitting: Otolaryngology

## 2016-12-22 DIAGNOSIS — H9121 Sudden idiopathic hearing loss, right ear: Secondary | ICD-10-CM | POA: Diagnosis not present

## 2016-12-22 DIAGNOSIS — H903 Sensorineural hearing loss, bilateral: Secondary | ICD-10-CM

## 2017-02-02 ENCOUNTER — Ambulatory Visit (INDEPENDENT_AMBULATORY_CARE_PROVIDER_SITE_OTHER): Payer: Medicare Other | Admitting: *Deleted

## 2017-02-02 DIAGNOSIS — R001 Bradycardia, unspecified: Secondary | ICD-10-CM | POA: Diagnosis not present

## 2017-02-02 NOTE — Progress Notes (Signed)
Remote pacemaker transmission.   

## 2017-02-10 ENCOUNTER — Encounter: Payer: Self-pay | Admitting: Cardiology

## 2017-02-10 LAB — CUP PACEART REMOTE DEVICE CHECK
Date Time Interrogation Session: 20181106140142
Implantable Lead Location: 753860
Implantable Pulse Generator Implant Date: 20150413
MDC IDC LEAD IMPLANT DT: 20150413
MDC IDC LEAD SERIAL: 29601447
MDC IDC PG SERIAL: 68133668

## 2017-02-14 ENCOUNTER — Emergency Department (HOSPITAL_COMMUNITY): Payer: Medicare Other

## 2017-02-14 ENCOUNTER — Inpatient Hospital Stay (HOSPITAL_COMMUNITY)
Admission: EM | Admit: 2017-02-14 | Discharge: 2017-02-23 | DRG: 481 | Disposition: A | Discharge status: Rehabilitation Facility | Payer: Medicare Other | Attending: Family Medicine | Admitting: Family Medicine

## 2017-02-14 ENCOUNTER — Encounter (HOSPITAL_COMMUNITY): Payer: Self-pay | Admitting: Emergency Medicine

## 2017-02-14 DIAGNOSIS — Z8673 Personal history of transient ischemic attack (TIA), and cerebral infarction without residual deficits: Secondary | ICD-10-CM

## 2017-02-14 DIAGNOSIS — R7303 Prediabetes: Secondary | ICD-10-CM

## 2017-02-14 DIAGNOSIS — Z7901 Long term (current) use of anticoagulants: Secondary | ICD-10-CM

## 2017-02-14 DIAGNOSIS — R5382 Chronic fatigue, unspecified: Secondary | ICD-10-CM | POA: Diagnosis present

## 2017-02-14 DIAGNOSIS — S82041A Displaced comminuted fracture of right patella, initial encounter for closed fracture: Secondary | ICD-10-CM

## 2017-02-14 DIAGNOSIS — G8918 Other acute postprocedural pain: Secondary | ICD-10-CM

## 2017-02-14 DIAGNOSIS — I2510 Atherosclerotic heart disease of native coronary artery without angina pectoris: Secondary | ICD-10-CM

## 2017-02-14 DIAGNOSIS — S72401A Unspecified fracture of lower end of right femur, initial encounter for closed fracture: Secondary | ICD-10-CM

## 2017-02-14 DIAGNOSIS — Z90710 Acquired absence of both cervix and uterus: Secondary | ICD-10-CM

## 2017-02-14 DIAGNOSIS — S12191A Other nondisplaced fracture of second cervical vertebra, initial encounter for closed fracture: Secondary | ICD-10-CM | POA: Diagnosis present

## 2017-02-14 DIAGNOSIS — Z950 Presence of cardiac pacemaker: Secondary | ICD-10-CM

## 2017-02-14 DIAGNOSIS — I495 Sick sinus syndrome: Secondary | ICD-10-CM | POA: Diagnosis present

## 2017-02-14 DIAGNOSIS — Z419 Encounter for procedure for purposes other than remedying health state, unspecified: Secondary | ICD-10-CM

## 2017-02-14 DIAGNOSIS — D62 Acute posthemorrhagic anemia: Secondary | ICD-10-CM

## 2017-02-14 DIAGNOSIS — K219 Gastro-esophageal reflux disease without esophagitis: Secondary | ICD-10-CM | POA: Diagnosis present

## 2017-02-14 DIAGNOSIS — Z96641 Presence of right artificial hip joint: Secondary | ICD-10-CM

## 2017-02-14 DIAGNOSIS — Z981 Arthrodesis status: Secondary | ICD-10-CM

## 2017-02-14 DIAGNOSIS — Y92410 Unspecified street and highway as the place of occurrence of the external cause: Secondary | ICD-10-CM

## 2017-02-14 DIAGNOSIS — S42409A Unspecified fracture of lower end of unspecified humerus, initial encounter for closed fracture: Secondary | ICD-10-CM

## 2017-02-14 DIAGNOSIS — S12100A Unspecified displaced fracture of second cervical vertebra, initial encounter for closed fracture: Secondary | ICD-10-CM | POA: Diagnosis present

## 2017-02-14 DIAGNOSIS — S42421A Displaced comminuted supracondylar fracture without intercondylar fracture of right humerus, initial encounter for closed fracture: Secondary | ICD-10-CM

## 2017-02-14 DIAGNOSIS — Z7982 Long term (current) use of aspirin: Secondary | ICD-10-CM

## 2017-02-14 DIAGNOSIS — H9190 Unspecified hearing loss, unspecified ear: Secondary | ICD-10-CM | POA: Diagnosis present

## 2017-02-14 DIAGNOSIS — S72451A Displaced supracondylar fracture without intracondylar extension of lower end of right femur, initial encounter for closed fracture: Secondary | ICD-10-CM | POA: Diagnosis not present

## 2017-02-14 DIAGNOSIS — S82001A Unspecified fracture of right patella, initial encounter for closed fracture: Secondary | ICD-10-CM | POA: Diagnosis present

## 2017-02-14 DIAGNOSIS — Z888 Allergy status to other drugs, medicaments and biological substances status: Secondary | ICD-10-CM

## 2017-02-14 DIAGNOSIS — Z9889 Other specified postprocedural states: Secondary | ICD-10-CM

## 2017-02-14 DIAGNOSIS — E876 Hypokalemia: Secondary | ICD-10-CM | POA: Diagnosis present

## 2017-02-14 DIAGNOSIS — S12391A Other nondisplaced fracture of fourth cervical vertebra, initial encounter for closed fracture: Secondary | ICD-10-CM

## 2017-02-14 DIAGNOSIS — E785 Hyperlipidemia, unspecified: Secondary | ICD-10-CM | POA: Diagnosis present

## 2017-02-14 DIAGNOSIS — S42411A Displaced simple supracondylar fracture without intercondylar fracture of right humerus, initial encounter for closed fracture: Secondary | ICD-10-CM | POA: Diagnosis present

## 2017-02-14 DIAGNOSIS — I150 Renovascular hypertension: Secondary | ICD-10-CM | POA: Diagnosis present

## 2017-02-14 DIAGNOSIS — Z8249 Family history of ischemic heart disease and other diseases of the circulatory system: Secondary | ICD-10-CM

## 2017-02-14 DIAGNOSIS — Z79899 Other long term (current) drug therapy: Secondary | ICD-10-CM

## 2017-02-14 DIAGNOSIS — Z885 Allergy status to narcotic agent status: Secondary | ICD-10-CM

## 2017-02-14 DIAGNOSIS — S72461A Displaced supracondylar fracture with intracondylar extension of lower end of right femur, initial encounter for closed fracture: Secondary | ICD-10-CM

## 2017-02-14 DIAGNOSIS — I5032 Chronic diastolic (congestive) heart failure: Secondary | ICD-10-CM | POA: Diagnosis present

## 2017-02-14 DIAGNOSIS — E119 Type 2 diabetes mellitus without complications: Secondary | ICD-10-CM

## 2017-02-14 DIAGNOSIS — R112 Nausea with vomiting, unspecified: Secondary | ICD-10-CM | POA: Diagnosis present

## 2017-02-14 DIAGNOSIS — Z90722 Acquired absence of ovaries, bilateral: Secondary | ICD-10-CM

## 2017-02-14 DIAGNOSIS — I480 Paroxysmal atrial fibrillation: Secondary | ICD-10-CM

## 2017-02-14 DIAGNOSIS — G629 Polyneuropathy, unspecified: Secondary | ICD-10-CM | POA: Diagnosis present

## 2017-02-14 DIAGNOSIS — Z8542 Personal history of malignant neoplasm of other parts of uterus: Secondary | ICD-10-CM

## 2017-02-14 DIAGNOSIS — T148XXA Other injury of unspecified body region, initial encounter: Secondary | ICD-10-CM

## 2017-02-14 DIAGNOSIS — Z955 Presence of coronary angioplasty implant and graft: Secondary | ICD-10-CM

## 2017-02-14 DIAGNOSIS — D72829 Elevated white blood cell count, unspecified: Secondary | ICD-10-CM

## 2017-02-14 DIAGNOSIS — I482 Chronic atrial fibrillation: Secondary | ICD-10-CM | POA: Diagnosis present

## 2017-02-14 DIAGNOSIS — S82091A Other fracture of right patella, initial encounter for closed fracture: Secondary | ICD-10-CM | POA: Diagnosis present

## 2017-02-14 DIAGNOSIS — M810 Age-related osteoporosis without current pathological fracture: Secondary | ICD-10-CM | POA: Diagnosis present

## 2017-02-14 LAB — BASIC METABOLIC PANEL
Anion gap: 11 (ref 5–15)
BUN: 11 mg/dL (ref 6–20)
CHLORIDE: 99 mmol/L — AB (ref 101–111)
CO2: 24 mmol/L (ref 22–32)
Calcium: 9.7 mg/dL (ref 8.9–10.3)
Creatinine, Ser: 0.92 mg/dL (ref 0.44–1.00)
GFR calc non Af Amer: 52 mL/min — ABNORMAL LOW (ref >60)
Glucose, Bld: 182 mg/dL — ABNORMAL HIGH (ref 65–99)
POTASSIUM: 3.7 mmol/L (ref 3.5–5.1)
SODIUM: 134 mmol/L — AB (ref 135–145)

## 2017-02-14 LAB — CBC
HCT: 45.6 % (ref 36.0–46.0)
HEMOGLOBIN: 15.4 g/dL — AB (ref 12.0–15.0)
MCH: 30.3 pg (ref 26.0–34.0)
MCHC: 33.8 g/dL (ref 30.0–36.0)
MCV: 89.6 fL (ref 78.0–100.0)
Platelets: 294 10*3/uL (ref 150–400)
RBC: 5.09 MIL/uL (ref 3.87–5.11)
RDW: 13.5 % (ref 11.5–15.5)
WBC: 15.9 10*3/uL — ABNORMAL HIGH (ref 4.0–10.5)

## 2017-02-14 LAB — PROTIME-INR
INR: 1.9
PROTHROMBIN TIME: 21.6 s — AB (ref 11.4–15.2)

## 2017-02-14 MED ORDER — FENTANYL CITRATE (PF) 100 MCG/2ML IJ SOLN
50.0000 ug | Freq: Once | INTRAMUSCULAR | Status: AC
  Administered 2017-02-14: 50 ug via INTRAVENOUS
  Filled 2017-02-14: qty 2

## 2017-02-14 MED ORDER — ONDANSETRON HCL 4 MG/2ML IJ SOLN
4.0000 mg | Freq: Once | INTRAMUSCULAR | Status: AC
  Administered 2017-02-14: 4 mg via INTRAVENOUS
  Filled 2017-02-14: qty 2

## 2017-02-14 MED ORDER — SODIUM CHLORIDE 0.9 % IV BOLUS (SEPSIS)
500.0000 mL | Freq: Once | INTRAVENOUS | Status: AC
  Administered 2017-02-14: 500 mL via INTRAVENOUS
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
## 2017-02-14 NOTE — ED Notes (Signed)
Ortho tech notified of need for splint 

## 2017-02-14 NOTE — ED Notes (Signed)
ED Provider at bedside. 
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
## 2017-02-14 NOTE — ED Notes (Signed)
Ortho tech at the bedside.  
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
## 2017-02-14 NOTE — ED Notes (Signed)
Patient transported to CT 

## 2017-02-15 ENCOUNTER — Inpatient Hospital Stay (HOSPITAL_COMMUNITY): Payer: Medicare Other

## 2017-02-15 DIAGNOSIS — S7292XG Unspecified fracture of left femur, subsequent encounter for closed fracture with delayed healing: Secondary | ICD-10-CM | POA: Diagnosis not present

## 2017-02-15 DIAGNOSIS — M79674 Pain in right toe(s): Secondary | ICD-10-CM | POA: Diagnosis not present

## 2017-02-15 DIAGNOSIS — R5381 Other malaise: Secondary | ICD-10-CM | POA: Diagnosis not present

## 2017-02-15 DIAGNOSIS — M810 Age-related osteoporosis without current pathological fracture: Secondary | ICD-10-CM | POA: Diagnosis present

## 2017-02-15 DIAGNOSIS — R7303 Prediabetes: Secondary | ICD-10-CM | POA: Diagnosis not present

## 2017-02-15 DIAGNOSIS — Z9889 Other specified postprocedural states: Secondary | ICD-10-CM | POA: Diagnosis not present

## 2017-02-15 DIAGNOSIS — S72401A Unspecified fracture of lower end of right femur, initial encounter for closed fracture: Secondary | ICD-10-CM | POA: Diagnosis not present

## 2017-02-15 DIAGNOSIS — T07XXXA Unspecified multiple injuries, initial encounter: Secondary | ICD-10-CM | POA: Diagnosis not present

## 2017-02-15 DIAGNOSIS — I482 Chronic atrial fibrillation: Secondary | ICD-10-CM | POA: Diagnosis not present

## 2017-02-15 DIAGNOSIS — E871 Hypo-osmolality and hyponatremia: Secondary | ICD-10-CM | POA: Diagnosis not present

## 2017-02-15 DIAGNOSIS — Z955 Presence of coronary angioplasty implant and graft: Secondary | ICD-10-CM | POA: Diagnosis not present

## 2017-02-15 DIAGNOSIS — T148XXA Other injury of unspecified body region, initial encounter: Secondary | ICD-10-CM | POA: Diagnosis not present

## 2017-02-15 DIAGNOSIS — I2510 Atherosclerotic heart disease of native coronary artery without angina pectoris: Secondary | ICD-10-CM | POA: Diagnosis present

## 2017-02-15 DIAGNOSIS — S42421S Displaced comminuted supracondylar fracture without intercondylar fracture of right humerus, sequela: Secondary | ICD-10-CM | POA: Diagnosis not present

## 2017-02-15 DIAGNOSIS — Z7901 Long term (current) use of anticoagulants: Secondary | ICD-10-CM | POA: Diagnosis not present

## 2017-02-15 DIAGNOSIS — E785 Hyperlipidemia, unspecified: Secondary | ICD-10-CM | POA: Diagnosis present

## 2017-02-15 DIAGNOSIS — N390 Urinary tract infection, site not specified: Secondary | ICD-10-CM | POA: Diagnosis not present

## 2017-02-15 DIAGNOSIS — S12100A Unspecified displaced fracture of second cervical vertebra, initial encounter for closed fracture: Secondary | ICD-10-CM | POA: Diagnosis present

## 2017-02-15 DIAGNOSIS — I480 Paroxysmal atrial fibrillation: Secondary | ICD-10-CM | POA: Diagnosis present

## 2017-02-15 DIAGNOSIS — S72451A Displaced supracondylar fracture without intracondylar extension of lower end of right femur, initial encounter for closed fracture: Secondary | ICD-10-CM | POA: Diagnosis present

## 2017-02-15 DIAGNOSIS — E876 Hypokalemia: Secondary | ICD-10-CM | POA: Diagnosis not present

## 2017-02-15 DIAGNOSIS — R4589 Other symptoms and signs involving emotional state: Secondary | ICD-10-CM | POA: Diagnosis not present

## 2017-02-15 DIAGNOSIS — S82041A Displaced comminuted fracture of right patella, initial encounter for closed fracture: Secondary | ICD-10-CM | POA: Diagnosis not present

## 2017-02-15 DIAGNOSIS — Y92410 Unspecified street and highway as the place of occurrence of the external cause: Secondary | ICD-10-CM | POA: Diagnosis not present

## 2017-02-15 DIAGNOSIS — E46 Unspecified protein-calorie malnutrition: Secondary | ICD-10-CM | POA: Diagnosis not present

## 2017-02-15 DIAGNOSIS — S7292XS Unspecified fracture of left femur, sequela: Secondary | ICD-10-CM | POA: Diagnosis not present

## 2017-02-15 DIAGNOSIS — S42421D Displaced comminuted supracondylar fracture without intercondylar fracture of right humerus, subsequent encounter for fracture with routine healing: Secondary | ICD-10-CM | POA: Diagnosis not present

## 2017-02-15 DIAGNOSIS — R112 Nausea with vomiting, unspecified: Secondary | ICD-10-CM | POA: Diagnosis present

## 2017-02-15 DIAGNOSIS — Z96641 Presence of right artificial hip joint: Secondary | ICD-10-CM | POA: Diagnosis present

## 2017-02-15 DIAGNOSIS — D62 Acute posthemorrhagic anemia: Secondary | ICD-10-CM | POA: Diagnosis not present

## 2017-02-15 DIAGNOSIS — I351 Nonrheumatic aortic (valve) insufficiency: Secondary | ICD-10-CM | POA: Diagnosis not present

## 2017-02-15 DIAGNOSIS — E119 Type 2 diabetes mellitus without complications: Secondary | ICD-10-CM | POA: Diagnosis not present

## 2017-02-15 DIAGNOSIS — S72401D Unspecified fracture of lower end of right femur, subsequent encounter for closed fracture with routine healing: Secondary | ICD-10-CM | POA: Diagnosis not present

## 2017-02-15 DIAGNOSIS — I340 Nonrheumatic mitral (valve) insufficiency: Secondary | ICD-10-CM | POA: Diagnosis not present

## 2017-02-15 DIAGNOSIS — M7989 Other specified soft tissue disorders: Secondary | ICD-10-CM | POA: Diagnosis not present

## 2017-02-15 DIAGNOSIS — G629 Polyneuropathy, unspecified: Secondary | ICD-10-CM | POA: Diagnosis present

## 2017-02-15 DIAGNOSIS — Z950 Presence of cardiac pacemaker: Secondary | ICD-10-CM | POA: Diagnosis not present

## 2017-02-15 DIAGNOSIS — Z8673 Personal history of transient ischemic attack (TIA), and cerebral infarction without residual deficits: Secondary | ICD-10-CM | POA: Diagnosis not present

## 2017-02-15 DIAGNOSIS — R5382 Chronic fatigue, unspecified: Secondary | ICD-10-CM | POA: Diagnosis present

## 2017-02-15 DIAGNOSIS — K219 Gastro-esophageal reflux disease without esophagitis: Secondary | ICD-10-CM | POA: Diagnosis present

## 2017-02-15 DIAGNOSIS — R3981 Functional urinary incontinence: Secondary | ICD-10-CM | POA: Diagnosis not present

## 2017-02-15 DIAGNOSIS — S82091A Other fracture of right patella, initial encounter for closed fracture: Secondary | ICD-10-CM | POA: Diagnosis present

## 2017-02-15 DIAGNOSIS — D72829 Elevated white blood cell count, unspecified: Secondary | ICD-10-CM | POA: Diagnosis not present

## 2017-02-15 DIAGNOSIS — G5791 Unspecified mononeuropathy of right lower limb: Secondary | ICD-10-CM | POA: Diagnosis not present

## 2017-02-15 DIAGNOSIS — Z8542 Personal history of malignant neoplasm of other parts of uterus: Secondary | ICD-10-CM | POA: Diagnosis not present

## 2017-02-15 DIAGNOSIS — I5032 Chronic diastolic (congestive) heart failure: Secondary | ICD-10-CM | POA: Diagnosis present

## 2017-02-15 DIAGNOSIS — Z79899 Other long term (current) drug therapy: Secondary | ICD-10-CM | POA: Diagnosis not present

## 2017-02-15 DIAGNOSIS — R8290 Unspecified abnormal findings in urine: Secondary | ICD-10-CM | POA: Diagnosis not present

## 2017-02-15 DIAGNOSIS — S42411A Displaced simple supracondylar fracture without intercondylar fracture of right humerus, initial encounter for closed fracture: Secondary | ICD-10-CM | POA: Diagnosis present

## 2017-02-15 DIAGNOSIS — I10 Essential (primary) hypertension: Secondary | ICD-10-CM | POA: Diagnosis not present

## 2017-02-15 DIAGNOSIS — Z419 Encounter for procedure for purposes other than remedying health state, unspecified: Secondary | ICD-10-CM | POA: Diagnosis not present

## 2017-02-15 DIAGNOSIS — S12191A Other nondisplaced fracture of second cervical vertebra, initial encounter for closed fracture: Secondary | ICD-10-CM | POA: Diagnosis present

## 2017-02-15 DIAGNOSIS — S82001A Unspecified fracture of right patella, initial encounter for closed fracture: Secondary | ICD-10-CM | POA: Diagnosis present

## 2017-02-15 DIAGNOSIS — S72461D Displaced supracondylar fracture with intracondylar extension of lower end of right femur, subsequent encounter for closed fracture with routine healing: Secondary | ICD-10-CM | POA: Diagnosis not present

## 2017-02-15 DIAGNOSIS — G8918 Other acute postprocedural pain: Secondary | ICD-10-CM | POA: Diagnosis not present

## 2017-02-15 DIAGNOSIS — Z7982 Long term (current) use of aspirin: Secondary | ICD-10-CM | POA: Diagnosis not present

## 2017-02-15 DIAGNOSIS — S42409A Unspecified fracture of lower end of unspecified humerus, initial encounter for closed fracture: Secondary | ICD-10-CM | POA: Diagnosis not present

## 2017-02-15 DIAGNOSIS — F329 Major depressive disorder, single episode, unspecified: Secondary | ICD-10-CM | POA: Diagnosis not present

## 2017-02-15 DIAGNOSIS — R079 Chest pain, unspecified: Secondary | ICD-10-CM | POA: Diagnosis not present

## 2017-02-15 LAB — HEMOGLOBIN A1C
HEMOGLOBIN A1C: 6.3 % — AB (ref 4.8–5.6)
Mean Plasma Glucose: 134.11 mg/dL

## 2017-02-15 LAB — BASIC METABOLIC PANEL
Anion gap: 10 (ref 5–15)
BUN: 11 mg/dL (ref 6–20)
CHLORIDE: 105 mmol/L (ref 101–111)
CO2: 21 mmol/L — AB (ref 22–32)
CREATININE: 0.75 mg/dL (ref 0.44–1.00)
Calcium: 8.6 mg/dL — ABNORMAL LOW (ref 8.9–10.3)
GFR calc Af Amer: >60 mL/min (ref >60)
GLUCOSE: 187 mg/dL — AB (ref 65–99)
POTASSIUM: 4.4 mmol/L (ref 3.5–5.1)
SODIUM: 136 mmol/L (ref 135–145)

## 2017-02-15 LAB — PROTIME-INR
INR: 1.86
Prothrombin Time: 21.3 seconds — ABNORMAL HIGH (ref 11.4–15.2)

## 2017-02-15 LAB — CBC
HEMATOCRIT: 37.8 % (ref 36.0–46.0)
Hemoglobin: 12.6 g/dL (ref 12.0–15.0)
MCH: 29.9 pg (ref 26.0–34.0)
MCHC: 33.3 g/dL (ref 30.0–36.0)
MCV: 89.8 fL (ref 78.0–100.0)
Platelets: 288 10*3/uL (ref 150–400)
RBC: 4.21 MIL/uL (ref 3.87–5.11)
RDW: 13.8 % (ref 11.5–15.5)
WBC: 19.5 10*3/uL — AB (ref 4.0–10.5)

## 2017-02-15 LAB — VITAMIN B12: Vitamin B-12: 255 pg/mL (ref 180–914)

## 2017-02-15 LAB — MRSA PCR SCREENING: MRSA BY PCR: NEGATIVE

## 2017-02-15 MED ORDER — NALOXONE HCL 0.4 MG/ML IJ SOLN: 0.4000 mg | INTRAMUSCULAR | Status: DC | PRN

## 2017-02-15 MED ORDER — ORAL CARE MOUTH RINSE
15.0000 mL | Freq: Two times a day (BID) | OROMUCOSAL | Status: DC
  Administered 2017-02-15 – 2017-02-16 (×3): 15 mL via OROMUCOSAL

## 2017-02-15 MED ORDER — ACETAMINOPHEN 325 MG PO TABS: 650.0000 mg | ORAL_TABLET | Freq: Four times a day (QID) | ORAL | Status: DC | PRN

## 2017-02-15 MED ORDER — DILTIAZEM HCL ER COATED BEADS 120 MG PO CP24: 120.0000 mg | ORAL_CAPSULE | Freq: Two times a day (BID) | ORAL | Status: DC

## 2017-02-15 MED ORDER — METOPROLOL TARTRATE 25 MG PO TABS: 25.0000 mg | ORAL_TABLET | Freq: Two times a day (BID) | ORAL | Status: DC

## 2017-02-15 MED ORDER — MORPHINE SULFATE (PF) 4 MG/ML IV SOLN
2.0000 mg | INTRAVENOUS | Status: DC | PRN
  Administered 2017-02-15: 2 mg via INTRAVENOUS
  Filled 2017-02-15: qty 1

## 2017-02-15 MED ORDER — CALCIUM CARBONATE ANTACID 500 MG PO CHEW
1.0000 | CHEWABLE_TABLET | Freq: Three times a day (TID) | ORAL | Status: DC | PRN
  Administered 2017-02-18: 200 mg via ORAL
  Filled 2017-02-15: qty 1

## 2017-02-15 MED ORDER — ONDANSETRON HCL 4 MG PO TABS: 4.0000 mg | ORAL_TABLET | Freq: Four times a day (QID) | ORAL | Status: DC | PRN

## 2017-02-15 MED ORDER — ENOXAPARIN SODIUM 60 MG/0.6ML ~~LOC~~ SOLN: 1.0000 mg/kg | Freq: Two times a day (BID) | SUBCUTANEOUS | Status: DC

## 2017-02-15 MED ORDER — MORPHINE SULFATE (PF) 4 MG/ML IV SOLN: 4.0000 mg | INTRAVENOUS | Status: DC | PRN

## 2017-02-15 MED ORDER — SODIUM CHLORIDE 0.9% FLUSH: 9.0000 mL | INTRAVENOUS | Status: DC | PRN

## 2017-02-15 MED ORDER — ONDANSETRON HCL 4 MG/2ML IJ SOLN
4.0000 mg | Freq: Four times a day (QID) | INTRAMUSCULAR | Status: DC | PRN
  Administered 2017-02-15 – 2017-02-20 (×6): 4 mg via INTRAVENOUS
  Filled 2017-02-15 (×6): qty 2

## 2017-02-15 MED ORDER — DIPHENHYDRAMINE HCL 50 MG/ML IJ SOLN
12.5000 mg | Freq: Four times a day (QID) | INTRAMUSCULAR | Status: DC | PRN
  Administered 2017-02-15: 12.5 mg via INTRAVENOUS
  Filled 2017-02-15: qty 1

## 2017-02-15 MED ORDER — ACETAMINOPHEN 650 MG RE SUPP: 650.0000 mg | Freq: Four times a day (QID) | RECTAL | Status: DC | PRN

## 2017-02-15 MED ORDER — ROSUVASTATIN CALCIUM 5 MG PO TABS
5.0000 mg | ORAL_TABLET | Freq: Every day | ORAL | Status: DC
  Administered 2017-02-15 – 2017-02-23 (×8): 5 mg via ORAL
  Filled 2017-02-15 (×8): qty 1

## 2017-02-15 MED ORDER — SODIUM CHLORIDE 0.9 % IV SOLN
INTRAVENOUS | Status: DC
  Administered 2017-02-14 – 2017-02-17 (×4): via INTRAVENOUS
  Administered 2017-02-18: 100 mL/h via INTRAVENOUS
  Administered 2017-02-19 – 2017-02-20 (×4): via INTRAVENOUS

## 2017-02-15 MED ORDER — METOPROLOL TARTRATE 25 MG PO TABS
25.0000 mg | ORAL_TABLET | Freq: Two times a day (BID) | ORAL | Status: DC
  Administered 2017-02-15 – 2017-02-16 (×4): 25 mg via ORAL
  Filled 2017-02-15 (×4): qty 1

## 2017-02-15 MED ORDER — KETOROLAC TROMETHAMINE 30 MG/ML IJ SOLN
15.0000 mg | Freq: Four times a day (QID) | INTRAMUSCULAR | Status: DC | PRN
  Administered 2017-02-15 – 2017-02-18 (×2): 15 mg via INTRAVENOUS
  Filled 2017-02-15 (×2): qty 1

## 2017-02-15 MED ORDER — CLONIDINE HCL 0.1 MG PO TABS: 0.1000 mg | ORAL_TABLET | Freq: Two times a day (BID) | ORAL | Status: DC

## 2017-02-15 MED ORDER — CLONIDINE HCL 0.1 MG PO TABS
0.2000 mg | ORAL_TABLET | Freq: Every day | ORAL | Status: DC
  Administered 2017-02-15 – 2017-02-17 (×4): 0.2 mg via ORAL
  Filled 2017-02-15 (×4): qty 2

## 2017-02-15 MED ORDER — SENNA 8.6 MG PO TABS
1.0000 | ORAL_TABLET | Freq: Two times a day (BID) | ORAL | Status: DC
  Administered 2017-02-15 – 2017-02-23 (×16): 8.6 mg via ORAL
  Filled 2017-02-15 (×16): qty 1

## 2017-02-15 MED ORDER — MORPHINE SULFATE 2 MG/ML IV SOLN
INTRAVENOUS | Status: DC
Start: 2017-02-15 — End: 2017-02-18
  Administered 2017-02-15: 2.68 mL via INTRAVENOUS
  Administered 2017-02-15: 3.68 mL via INTRAVENOUS
  Administered 2017-02-15: 2 mg via INTRAVENOUS
  Administered 2017-02-16: 8 mg via INTRAVENOUS
  Administered 2017-02-17: 0 mg via INTRAVENOUS
  Administered 2017-02-17: 5 mg via INTRAVENOUS
  Administered 2017-02-17: 0 mg via INTRAVENOUS
  Administered 2017-02-18: 1 mg via INTRAVENOUS
  Filled 2017-02-15: qty 30

## 2017-02-15 MED ORDER — CLONIDINE HCL 0.1 MG PO TABS
0.1000 mg | ORAL_TABLET | Freq: Every day | ORAL | Status: DC
  Administered 2017-02-15: 0.1 mg via ORAL
  Filled 2017-02-15: qty 1

## 2017-02-15 MED ORDER — DILTIAZEM HCL ER COATED BEADS 120 MG PO CP24
120.0000 mg | ORAL_CAPSULE | Freq: Two times a day (BID) | ORAL | Status: DC
  Administered 2017-02-15 – 2017-02-23 (×16): 120 mg via ORAL
  Filled 2017-02-15 (×17): qty 1

## 2017-02-15 MED ORDER — HEPARIN (PORCINE) IN NACL 100-0.45 UNIT/ML-% IJ SOLN
600.0000 [IU]/h | INTRAMUSCULAR | Status: DC
  Administered 2017-02-15: 600 [IU]/h via INTRAVENOUS
  Filled 2017-02-15: qty 250

## 2017-02-15 MED ORDER — SODIUM CHLORIDE 0.9% FLUSH
3.0000 mL | Freq: Two times a day (BID) | INTRAVENOUS | Status: DC
  Administered 2017-02-17 – 2017-02-23 (×9): 3 mL via INTRAVENOUS

## 2017-02-15 MED ORDER — POLYETHYLENE GLYCOL 3350 17 G PO PACK
17.0000 g | PACK | Freq: Every day | ORAL | Status: DC | PRN
  Administered 2017-02-22: 17 g via ORAL
  Filled 2017-02-15: qty 1

## 2017-02-15 MED ORDER — PANTOPRAZOLE SODIUM 40 MG PO TBEC
40.0000 mg | DELAYED_RELEASE_TABLET | Freq: Every day | ORAL | Status: DC
  Administered 2017-02-15 – 2017-02-23 (×8): 40 mg via ORAL
  Filled 2017-02-15 (×8): qty 1

## 2017-02-15 MED ORDER — MORPHINE SULFATE (PF) 4 MG/ML IV SOLN: 2.0000 mg | Freq: Three times a day (TID) | INTRAVENOUS | Status: DC

## 2017-02-15 MED ORDER — MORPHINE SULFATE (PF) 4 MG/ML IV SOLN: 4.0000 mg | Freq: Three times a day (TID) | INTRAVENOUS | Status: DC

## 2017-02-15 MED ORDER — ENOXAPARIN SODIUM 60 MG/0.6ML ~~LOC~~ SOLN
1.0000 mg/kg | Freq: Two times a day (BID) | SUBCUTANEOUS | Status: AC
  Administered 2017-02-15: 16:00:00 60 mg via SUBCUTANEOUS
  Filled 2017-02-15: qty 0.6

## 2017-02-15 MED ORDER — DIPHENHYDRAMINE HCL 12.5 MG/5ML PO ELIX: 12.5000 mg | ORAL_SOLUTION | Freq: Four times a day (QID) | ORAL | Status: DC | PRN
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
## 2017-02-15 NOTE — Progress Notes (Signed)
Unable to obtain specimen for heparin level. Multiple attempts made and unable to obtain enough sample for proper level in tube. Pharmacist is aware of difficulty.
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
## 2017-02-15 NOTE — Progress Notes (Signed)
CSW acknowledges consult placed for SNF placement. Patient will need to be evaluated by PT & OT prior to workup for placement being completed.  CSW to continue following for discharge.  Madilyn Fireman, MSW, LCSW-A Weekend Clinical Social Worker [phone]
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
## 2017-02-15 NOTE — Progress Notes (Signed)
0100: Pt arrived from ED. No knee immobilizer in place. Ortho tech paged and came to apply knee immobilizer to RLE. RLE significantly shorter than left. Visitor brought the rest of the pt's belongings to room and left for the night soon after.  0200: Pt resting in room. O2 sats as low as 89% with respirations as low as 10. Pt refused HOB elevation. Applied 2L O2. O2 sats to 100%. Titrated down to 1L. O2 sats 99%.  0300: Pt respiratory status improved and morphine PCA ordered. Awaiting PCA channel and medication from pharmacy.  0500: Received PCA morphine from pharmacy. Still awaiting PCA channel. Pt with one episode of emesis which woke her from sleeping. Endorses ongoing nausea and chest pain. 12 lead EKG obtained and in chart. Anti emetic and moderate dose pain medication administered while awaiting PCA setup.  0600: PCA equipment received and set up. Pt resting quietly. Respirations as low as 6. The alarm awakens the pt and she corrects her breathing rate.  0700: Handoff given to RN.

## 2017-02-15 NOTE — Progress Notes (Signed)
1900: Pt with single episode of brown emesis during shift change. Pt reports nausea intermittently during the day. Pt also endorses pruritis when asked. Another RN suggested this could be r/t the morphine PCA and pt may benefit from a different PCA drug such as dilaudid. I will consult provider.  2000: Pt agreeable with cluster care and as much sleep as possible tonight.  2100: Family Medicine resident made aware of n/v and pruritis. Hesitant to make changes since the patient is going to OR in the morning. I am agreeable to this plan as the pt reports adequate pain control with the current PCA. Will continue to manage nausea and pruritis with PRN meds.  2200: Pt became tachy to the 120s and had increased respirations to the low 20s. Upon assessment, she was found to have had another episode of dark brown emesis. Vitals signs returned to baseline as we cleaned her up and pt went back to sleep.  2300: Pt with no UOP on this shift. Last charted today 02/15/17 at 1146. Pt encouraged to void, but unable. Pt otherwise asymptomatic. Of note, the pt was educated on the purewick external catheter upon admission to the unit, and had been successfully voiding with this same method earlier this morning. Family Medicine resident consulted. In and out catheter ordered. 700 mL obtained.  0100: Receive call from Central Telemetry concerning 6-beat run of SVT.  0300: Pt sleeping well.  0600: Engaged in a long discussion with pt about her concerns for surgery. She looks forward to getting back to an active and independent routine after surgery. Reassurance was provided.  0700: Handoff report given to RN.
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
## 2017-02-16 ENCOUNTER — Inpatient Hospital Stay (HOSPITAL_COMMUNITY): Payer: Medicare Other | Admitting: Certified Registered"

## 2017-02-16 ENCOUNTER — Other Ambulatory Visit: Payer: Self-pay

## 2017-02-16 ENCOUNTER — Inpatient Hospital Stay (HOSPITAL_COMMUNITY): Payer: Medicare Other

## 2017-02-16 ENCOUNTER — Encounter (HOSPITAL_COMMUNITY)
Admission: EM | Disposition: A | Discharge status: Rehabilitation Facility | Payer: Self-pay | Source: Home / Self Care | Attending: Family Medicine

## 2017-02-16 HISTORY — PX: ORIF FEMUR FRACTURE: SHX2119

## 2017-02-16 LAB — BASIC METABOLIC PANEL
ANION GAP: 6 (ref 5–15)
BUN: 28 mg/dL — ABNORMAL HIGH (ref 6–20)
CHLORIDE: 107 mmol/L (ref 101–111)
CO2: 22 mmol/L (ref 22–32)
Calcium: 7.7 mg/dL — ABNORMAL LOW (ref 8.9–10.3)
Creatinine, Ser: 1.01 mg/dL — ABNORMAL HIGH (ref 0.44–1.00)
GFR, EST AFRICAN AMERICAN: 54 mL/min — AB (ref >60)
GFR, EST NON AFRICAN AMERICAN: 47 mL/min — AB (ref >60)
Glucose, Bld: 141 mg/dL — ABNORMAL HIGH (ref 65–99)
POTASSIUM: 4.8 mmol/L (ref 3.5–5.1)
SODIUM: 135 mmol/L (ref 135–145)

## 2017-02-16 LAB — CBC
HEMATOCRIT: 27.5 % — AB (ref 36.0–46.0)
HEMOGLOBIN: 9.2 g/dL — AB (ref 12.0–15.0)
MCH: 30.5 pg (ref 26.0–34.0)
MCHC: 33.5 g/dL (ref 30.0–36.0)
MCV: 91.1 fL (ref 78.0–100.0)
Platelets: 209 10*3/uL (ref 150–400)
RBC: 3.02 MIL/uL — AB (ref 3.87–5.11)
RDW: 14.4 % (ref 11.5–15.5)
WBC: 18.4 10*3/uL — AB (ref 4.0–10.5)

## 2017-02-16 LAB — ABO/RH: ABO/RH(D): B POS

## 2017-02-16 LAB — PREPARE RBC (CROSSMATCH)

## 2017-02-16 SURGERY — OPEN REDUCTION INTERNAL FIXATION (ORIF) DISTAL FEMUR FRACTURE
Anesthesia: General | Site: Leg Upper | Laterality: Right

## 2017-02-16 MED ORDER — ROCURONIUM BROMIDE 10 MG/ML (PF) SYRINGE
PREFILLED_SYRINGE | INTRAVENOUS | Status: AC
  Filled 2017-02-16: qty 5

## 2017-02-16 MED ORDER — SUGAMMADEX SODIUM 200 MG/2ML IV SOLN
INTRAVENOUS | Status: DC | PRN
  Administered 2017-02-16: 120 mg via INTRAVENOUS

## 2017-02-16 MED ORDER — HEPARIN (PORCINE) IN NACL 100-0.45 UNIT/ML-% IJ SOLN
800.0000 [IU]/h | INTRAMUSCULAR | Status: DC
  Administered 2017-02-17: 800 [IU]/h via INTRAVENOUS
  Filled 2017-02-16: qty 250

## 2017-02-16 MED ORDER — CHLORHEXIDINE GLUCONATE 0.12 % MT SOLN
15.0000 mL | Freq: Two times a day (BID) | OROMUCOSAL | Status: DC
  Administered 2017-02-16 – 2017-02-21 (×11): 15 mL via OROMUCOSAL
  Filled 2017-02-16 (×9): qty 15

## 2017-02-16 MED ORDER — DEXAMETHASONE SODIUM PHOSPHATE 10 MG/ML IJ SOLN
INTRAMUSCULAR | Status: DC | PRN
  Administered 2017-02-16: 5 mg via INTRAVENOUS

## 2017-02-16 MED ORDER — MEPERIDINE HCL 25 MG/ML IJ SOLN: 6.2500 mg | INTRAMUSCULAR | Status: DC | PRN

## 2017-02-16 MED ORDER — VANCOMYCIN HCL 1000 MG IV SOLR
INTRAVENOUS | Status: DC | PRN
  Administered 2017-02-16: 1000 mg via TOPICAL

## 2017-02-16 MED ORDER — CEFAZOLIN SODIUM-DEXTROSE 1-4 GM/50ML-% IV SOLN
1.0000 g | Freq: Three times a day (TID) | INTRAVENOUS | Status: AC
  Administered 2017-02-16 – 2017-02-17 (×3): 1 g via INTRAVENOUS
  Filled 2017-02-16 (×4): qty 50

## 2017-02-16 MED ORDER — ALBUMIN HUMAN 5 % IV SOLN
INTRAVENOUS | Status: DC | PRN
  Administered 2017-02-16: 15:00:00 via INTRAVENOUS

## 2017-02-16 MED ORDER — LACTATED RINGERS IV SOLN
INTRAVENOUS | Status: DC | PRN
  Administered 2017-02-16 (×2): via INTRAVENOUS

## 2017-02-16 MED ORDER — DEXAMETHASONE SODIUM PHOSPHATE 10 MG/ML IJ SOLN
INTRAMUSCULAR | Status: AC
  Filled 2017-02-16: qty 1

## 2017-02-16 MED ORDER — ROCURONIUM BROMIDE 10 MG/ML (PF) SYRINGE
PREFILLED_SYRINGE | INTRAVENOUS | Status: DC | PRN
  Administered 2017-02-16: 50 mg via INTRAVENOUS
  Administered 2017-02-16: 10 mg via INTRAVENOUS

## 2017-02-16 MED ORDER — LIDOCAINE 2% (20 MG/ML) 5 ML SYRINGE
INTRAMUSCULAR | Status: DC | PRN
  Administered 2017-02-16: 60 mg via INTRAVENOUS

## 2017-02-16 MED ORDER — TOBRAMYCIN SULFATE 1.2 G IJ SOLR
INTRAMUSCULAR | Status: DC | PRN
  Administered 2017-02-16: 1.2 g via TOPICAL

## 2017-02-16 MED ORDER — 0.9 % SODIUM CHLORIDE (POUR BTL) OPTIME
TOPICAL | Status: DC | PRN
  Administered 2017-02-16: 1000 mL

## 2017-02-16 MED ORDER — ONDANSETRON HCL 4 MG/2ML IJ SOLN: 4.0000 mg | Freq: Once | INTRAMUSCULAR | Status: DC | PRN

## 2017-02-16 MED ORDER — PHENOL 1.4 % MT LIQD
1.0000 | OROMUCOSAL | Status: DC | PRN
  Filled 2017-02-16: qty 177

## 2017-02-16 MED ORDER — LIDOCAINE 2% (20 MG/ML) 5 ML SYRINGE
INTRAMUSCULAR | Status: AC
  Filled 2017-02-16: qty 5

## 2017-02-16 MED ORDER — CEFAZOLIN SODIUM-DEXTROSE 2-4 GM/100ML-% IV SOLN
2.0000 g | Freq: Once | INTRAVENOUS | Status: AC
  Administered 2017-02-16: 2 g via INTRAVENOUS

## 2017-02-16 MED ORDER — PHENYLEPHRINE 40 MCG/ML (10ML) SYRINGE FOR IV PUSH (FOR BLOOD PRESSURE SUPPORT)
PREFILLED_SYRINGE | INTRAVENOUS | Status: AC
  Filled 2017-02-16: qty 10

## 2017-02-16 MED ORDER — NALOXONE HCL 0.4 MG/ML IJ SOLN
INTRAMUSCULAR | Status: DC | PRN
  Administered 2017-02-16: 0.1 mg via INTRAVENOUS

## 2017-02-16 MED ORDER — FENTANYL CITRATE (PF) 250 MCG/5ML IJ SOLN
INTRAMUSCULAR | Status: DC | PRN
  Administered 2017-02-16 (×2): 50 ug via INTRAVENOUS
  Administered 2017-02-16: 25 ug via INTRAVENOUS

## 2017-02-16 MED ORDER — PHENYLEPHRINE HCL 10 MG/ML IJ SOLN
INTRAVENOUS | Status: DC | PRN
  Administered 2017-02-16: 40 ug/min via INTRAVENOUS

## 2017-02-16 MED ORDER — LACTATED RINGERS IV SOLN
Freq: Once | INTRAVENOUS | Status: AC
  Administered 2017-02-16: 12:00:00 via INTRAVENOUS

## 2017-02-16 MED ORDER — ORAL CARE MOUTH RINSE
15.0000 mL | Freq: Two times a day (BID) | OROMUCOSAL | Status: DC
  Administered 2017-02-17 – 2017-02-23 (×13): 15 mL via OROMUCOSAL

## 2017-02-16 MED ORDER — FENTANYL CITRATE (PF) 250 MCG/5ML IJ SOLN
INTRAMUSCULAR | Status: AC
  Filled 2017-02-16: qty 5

## 2017-02-16 MED ORDER — HYDROMORPHONE HCL 1 MG/ML IJ SOLN
INTRAMUSCULAR | Status: AC
  Filled 2017-02-16: qty 1

## 2017-02-16 MED ORDER — PHENYLEPHRINE 40 MCG/ML (10ML) SYRINGE FOR IV PUSH (FOR BLOOD PRESSURE SUPPORT)
PREFILLED_SYRINGE | INTRAVENOUS | Status: DC | PRN
  Administered 2017-02-16: 200 ug via INTRAVENOUS
  Administered 2017-02-16: 120 ug via INTRAVENOUS
  Administered 2017-02-16: 80 ug via INTRAVENOUS
  Administered 2017-02-16 (×2): 160 ug via INTRAVENOUS

## 2017-02-16 MED ORDER — CEFAZOLIN SODIUM-DEXTROSE 2-4 GM/100ML-% IV SOLN
INTRAVENOUS | Status: AC
  Filled 2017-02-16: qty 100

## 2017-02-16 MED ORDER — SUGAMMADEX SODIUM 200 MG/2ML IV SOLN
INTRAVENOUS | Status: AC
  Filled 2017-02-16: qty 2

## 2017-02-16 MED ORDER — PROPOFOL 10 MG/ML IV BOLUS
INTRAVENOUS | Status: AC
  Filled 2017-02-16: qty 20

## 2017-02-16 MED ORDER — HYDROMORPHONE HCL 1 MG/ML IJ SOLN: 0.2500 mg | INTRAMUSCULAR | Status: DC | PRN

## 2017-02-16 MED ORDER — VANCOMYCIN HCL 1000 MG IV SOLR
INTRAVENOUS | Status: AC
  Filled 2017-02-16: qty 1000

## 2017-02-16 MED ORDER — ONDANSETRON HCL 4 MG/2ML IJ SOLN
INTRAMUSCULAR | Status: DC | PRN
  Administered 2017-02-16: 4 mg via INTRAVENOUS

## 2017-02-16 MED ORDER — ONDANSETRON HCL 4 MG/2ML IJ SOLN
INTRAMUSCULAR | Status: AC
  Filled 2017-02-16: qty 2

## 2017-02-16 MED ORDER — TOBRAMYCIN SULFATE 1.2 G IJ SOLR
INTRAMUSCULAR | Status: AC
  Filled 2017-02-16: qty 1.2

## 2017-02-16 MED ORDER — PROPOFOL 10 MG/ML IV BOLUS
INTRAVENOUS | Status: DC | PRN
  Administered 2017-02-16: 90 mg via INTRAVENOUS

## 2017-02-16 MED ORDER — HYDROMORPHONE HCL 1 MG/ML IJ SOLN
0.2500 mg | INTRAMUSCULAR | Status: DC | PRN
Start: 2017-02-16 — End: 2017-02-16
  Administered 2017-02-16 (×2): 0.25 mg via INTRAVENOUS

## 2017-02-16 SURGICAL SUPPLY — 103 items
BANDAGE ACE 4X5 VEL STRL LF (GAUZE/BANDAGES/DRESSINGS) ×4 IMPLANT
BANDAGE ACE 6X5 VEL STRL LF (GAUZE/BANDAGES/DRESSINGS) ×4 IMPLANT
BIT DRILL 2.4 AO COUPLING CANN (BIT) ×4 IMPLANT
BIT DRILL 4.3 (BIT) ×3
BIT DRILL 4.3MM (BIT) ×1
BIT DRILL 4.3X300MM (BIT) ×2 IMPLANT
BIT DRILL CANN 3.5MM (DRILL) ×2 IMPLANT
BIT DRILL QC 3.3X195 (BIT) ×4 IMPLANT
BLADE AVERAGE 25MMX9MM (BLADE)
BLADE AVERAGE 25X9 (BLADE) IMPLANT
BLADE CLIPPER SURG (BLADE) IMPLANT
BNDG COHESIVE 6X5 TAN STRL LF (GAUZE/BANDAGES/DRESSINGS) ×4 IMPLANT
BNDG ESMARK 4X9 LF (GAUZE/BANDAGES/DRESSINGS) ×4 IMPLANT
BNDG GAUZE ELAST 4 BULKY (GAUZE/BANDAGES/DRESSINGS) ×8 IMPLANT
BRUSH SCRUB SURG 4.25 DISP (MISCELLANEOUS) ×8 IMPLANT
CANISTER SUCT 3000ML PPV (MISCELLANEOUS) ×4 IMPLANT
CANISTER WOUND CARE 500ML ATS (WOUND CARE) ×4 IMPLANT
CAP LOCK NCB (Cap) ×24 IMPLANT
CAP NCB LOCKING (Cap) ×8 IMPLANT
CHLORAPREP W/TINT 26ML (MISCELLANEOUS) ×4 IMPLANT
CORDS BIPOLAR (ELECTRODE) ×4 IMPLANT
COVER SURGICAL LIGHT HANDLE (MISCELLANEOUS) ×8 IMPLANT
DERMABOND ADVANCED (GAUZE/BANDAGES/DRESSINGS) ×4
DERMABOND ADVANCED .7 DNX12 (GAUZE/BANDAGES/DRESSINGS) ×4 IMPLANT
DRAIN PENROSE 1/4X12 LTX STRL (WOUND CARE) IMPLANT
DRAPE C-ARM 42X72 X-RAY (DRAPES) ×4 IMPLANT
DRAPE C-ARMOR (DRAPES) ×4 IMPLANT
DRAPE HALF SHEET 40X57 (DRAPES) ×4 IMPLANT
DRAPE INCISE IOBAN 66X45 STRL (DRAPES) IMPLANT
DRAPE ORTHO SPLIT 77X108 STRL (DRAPES) ×2
DRAPE SURG 17X23 STRL (DRAPES) ×4 IMPLANT
DRAPE SURG ORHT 6 SPLT 77X108 (DRAPES) ×2 IMPLANT
DRAPE U-SHAPE 47X51 STRL (DRAPES) ×8 IMPLANT
DRILL CANN 3.5MM (DRILL) ×4
DRSG ADAPTIC 3X8 NADH LF (GAUZE/BANDAGES/DRESSINGS) ×4 IMPLANT
DRSG MEPILEX BORDER 4X12 (GAUZE/BANDAGES/DRESSINGS) IMPLANT
DRSG MEPILEX BORDER 4X4 (GAUZE/BANDAGES/DRESSINGS) IMPLANT
DRSG MEPILEX BORDER 4X8 (GAUZE/BANDAGES/DRESSINGS) IMPLANT
DRSG MEPITEL 4X7.2 (GAUZE/BANDAGES/DRESSINGS) ×4 IMPLANT
DRSG PAD ABDOMINAL 8X10 ST (GAUZE/BANDAGES/DRESSINGS) ×16 IMPLANT
ELECT REM PT RETURN 9FT ADLT (ELECTROSURGICAL) ×4
ELECTRODE REM PT RTRN 9FT ADLT (ELECTROSURGICAL) ×2 IMPLANT
EVACUATOR 1/8 PVC DRAIN (DRAIN) IMPLANT
EVACUATOR 3/16  PVC DRAIN (DRAIN)
EVACUATOR 3/16 PVC DRAIN (DRAIN) IMPLANT
GAUZE SPONGE 4X4 12PLY STRL (GAUZE/BANDAGES/DRESSINGS) ×4 IMPLANT
GLOVE BIO SURGEON STRL SZ7.5 (GLOVE) ×16 IMPLANT
GLOVE BIOGEL PI IND STRL 7.5 (GLOVE) ×2 IMPLANT
GLOVE BIOGEL PI IND STRL 8 (GLOVE) ×2 IMPLANT
GLOVE BIOGEL PI INDICATOR 7.5 (GLOVE) ×2
GLOVE BIOGEL PI INDICATOR 8 (GLOVE) ×2
GOWN STRL REUS W/ TWL LRG LVL3 (GOWN DISPOSABLE) ×6 IMPLANT
GOWN STRL REUS W/ TWL XL LVL3 (GOWN DISPOSABLE) ×2 IMPLANT
GOWN STRL REUS W/TWL LRG LVL3 (GOWN DISPOSABLE) ×6
GOWN STRL REUS W/TWL XL LVL3 (GOWN DISPOSABLE) ×2
K-WIRE 2.0 (WIRE) ×12
K-WIRE FXSTD 280X2XNS SS (WIRE) ×12
KIT BASIN OR (CUSTOM PROCEDURE TRAY) ×4 IMPLANT
KIT PREVENA INCISION MGT 13 (CANNISTER) ×4 IMPLANT
KIT ROOM TURNOVER OR (KITS) ×4 IMPLANT
KWIRE FXSTD 280X2XNS SS (WIRE) ×12 IMPLANT
MANIFOLD NEPTUNE II (INSTRUMENTS) ×4 IMPLANT
NEEDLE 22X1 1/2 (OR ONLY) (NEEDLE) IMPLANT
NEEDLE HYPO 25X1 1.5 SAFETY (NEEDLE) ×4 IMPLANT
NS IRRIG 1000ML POUR BTL (IV SOLUTION) ×4 IMPLANT
PACK GENERAL/GYN (CUSTOM PROCEDURE TRAY) ×4 IMPLANT
PACK ORTHO EXTREMITY (CUSTOM PROCEDURE TRAY) ×4 IMPLANT
PAD ABD 8X10 STRL (GAUZE/BANDAGES/DRESSINGS) ×4 IMPLANT
PAD ARMBOARD 7.5X6 YLW CONV (MISCELLANEOUS) ×8 IMPLANT
PAD CAST 4YDX4 CTTN HI CHSV (CAST SUPPLIES) ×2 IMPLANT
PADDING CAST COTTON 4X4 STRL (CAST SUPPLIES) ×2
PADDING CAST COTTON 6X4 STRL (CAST SUPPLIES) ×4 IMPLANT
PLATE FEM DIST NCB PP 278MM (Plate) ×4 IMPLANT
SCREW 5.0 70MM (Screw) ×8 IMPLANT
SCREW 5.0 80MM (Screw) ×8 IMPLANT
SCREW CANN 4.0X60MM PART THRD (Screw) ×4 IMPLANT
SCREW CANN 5.0X80MM FULL THD (Screw) ×4 IMPLANT
SCREW CANN 5.0X85MM FULL THD (Screw) ×4 IMPLANT
SCREW CANN NCB PA 6.2X4.4/5 (Screw) ×4 IMPLANT
SCREW NCB 3.5X75X5X6.2XST (Screw) ×2 IMPLANT
SCREW NCB 5.0X34MM (Screw) ×8 IMPLANT
SCREW NCB 5.0X75MM (Screw) ×2 IMPLANT
SCREW UNICORTICAL NCB 5.0X20 (Screw) ×4 IMPLANT
SPONGE LAP 18X18 X RAY DECT (DISPOSABLE) ×4 IMPLANT
STAPLER VISISTAT 35W (STAPLE) ×4 IMPLANT
STOCKINETTE IMPERVIOUS 9X36 MD (GAUZE/BANDAGES/DRESSINGS) IMPLANT
SUCTION FRAZIER HANDLE 10FR (MISCELLANEOUS) ×2
SUCTION TUBE FRAZIER 10FR DISP (MISCELLANEOUS) ×2 IMPLANT
SUT ETHILON 3 0 PS 1 (SUTURE) ×8 IMPLANT
SUT MNCRL AB 3-0 PS2 18 (SUTURE) ×4 IMPLANT
SUT VIC AB 0 CT1 27 (SUTURE) ×4
SUT VIC AB 0 CT1 27XBRD ANBCTR (SUTURE) ×4 IMPLANT
SUT VIC AB 1 CT1 27 (SUTURE) ×4
SUT VIC AB 1 CT1 27XBRD ANBCTR (SUTURE) ×4 IMPLANT
SUT VIC AB 2-0 CT1 27 (SUTURE) ×4
SUT VIC AB 2-0 CT1 TAPERPNT 27 (SUTURE) ×4 IMPLANT
SYR 5ML LL (SYRINGE) IMPLANT
SYR CONTROL 10ML LL (SYRINGE) ×4 IMPLANT
TOWEL OR 17X24 6PK STRL BLUE (TOWEL DISPOSABLE) ×4 IMPLANT
TOWEL OR 17X26 10 PK STRL BLUE (TOWEL DISPOSABLE) ×12 IMPLANT
TRAY FOLEY W/METER SILVER 16FR (SET/KITS/TRAYS/PACK) IMPLANT
WATER STERILE IRR 1000ML POUR (IV SOLUTION) ×8 IMPLANT
YANKAUER SUCT BULB TIP NO VENT (SUCTIONS) IMPLANT

## 2017-02-16 NOTE — Progress Notes (Signed)
Physical medicine rehabilitation consult requested chart reviewed. Patient planned for surgery today as documented away plan of care and any further recommendations and then will follow-up with appropriate rehabilitation consult

## 2017-02-16 NOTE — Progress Notes (Signed)
Patient returned from OR at this time. Drowsy bur easily arousable. Family at bedside.

## 2017-02-16 NOTE — Anesthesia Procedure Notes (Signed)
Procedure Name: Intubation Date/Time: 02/16/2017 1:22 PM Performed by: Freddie Breech, CRNA Pre-anesthesia Checklist: Patient identified, Emergency Drugs available, Suction available and Patient being monitored Patient Re-evaluated:Patient Re-evaluated prior to induction Oxygen Delivery Method: Circle System Utilized Preoxygenation: Pre-oxygenation with 100% oxygen Induction Type: IV induction Ventilation: Mask ventilation without difficulty Laryngoscope Size: Glidescope and 3 Grade View: Grade I Tube type: Oral Tube size: 7.0 mm Number of attempts: 1 Airway Equipment and Method: Oral airway,  Video-laryngoscopy and Rigid stylet Placement Confirmation: positive ETCO2 and breath sounds checked- equal and bilateral (ETT confirmed with video laryngoscopy. ) Secured at: 21 cm Tube secured with: Tape Dental Injury: Teeth and Oropharynx as per pre-operative assessment
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
## 2017-02-16 NOTE — Transfer of Care (Signed)
Immediate Anesthesia Transfer of Care Note  Patient: Karina [name]  Procedure(s) Performed: OPEN REDUCTION INTERNAL FIXATION (ORIF) DISTAL FEMUR FRACTURE (Right Leg Upper)  Patient Location: PACU  Anesthesia Type:General  Level of Consciousness: awake, alert , oriented and patient cooperative  Airway & Oxygen Therapy: Patient Spontanous Breathing and Patient connected to face mask oxygen  Post-op Assessment: Report given to RN and Post -op Vital signs reviewed and stable  Post vital signs: Reviewed and stable  Last Vitals:  Vitals:   02/16/17 0817 02/16/17 0900  BP:  (!) 143/92  Pulse:    Resp: 18 (!) 21  Temp:  36.6 C  SpO2: 96% 96%    Last Pain:  Vitals:   02/16/17 1155  TempSrc:   PainSc: 10-Worst pain ever      Patients Stated Pain Goal: 3 (12/81/18 8677)  Complications: No apparent anesthesia complications
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
## 2017-02-16 NOTE — Progress Notes (Signed)
PT Cancellation Note  Patient Details Name: Karina [name] MRN: [id number] DOB: 10/23/1922   Cancelled Treatment:    Reason Eval/Treat Not Completed: Patient not medically ready. Pt currently on bedrest. Will await increased activity orders prior to initiating PT eval.    Thelma Comp 02/16/2017, 7:15 AM   Rolinda Roan, PT, DPT Acute Rehabilitation Services Pager: [phone]

## 2017-02-16 NOTE — Progress Notes (Signed)
Patient to go to surgery later today.  However, I do not see a note on the chart from neurosurgery addressing the C-spine fractures.  I would suggest getting NS to see thepatient prior to surgeyr.\\Jocelynn Gioffre O. Dahlia Bailiff, MD, [place] [phone] Trauma Surgeon
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
## 2017-02-16 NOTE — Progress Notes (Signed)
Patient is transferred to the OR at this time for Right distal femur ORIF by Dr. Doreatha Martin.

## 2017-02-16 NOTE — Interval H&P Note (Signed)
History and Physical Interval Note:  02/16/2017 12:20 PM  Karina [name]  has presented today for surgery, with the diagnosis of Right distal femur and right distal humerus fracture  The various methods of treatment have been discussed with the patient and family. After consideration of risks, benefits and other options for treatment, the patient has consented to  ORIF of right distal femur fracture as a surgical intervention .  The patient's history has been reviewed, patient examined, no change in status, stable for surgery.  I have reviewed the patient's chart and labs.  Questions were answered to the patient's satisfaction.     Haddix, Thomasene Lot

## 2017-02-16 NOTE — Anesthesia Preprocedure Evaluation (Signed)
Anesthesia Evaluation  Patient identified by MRN, date of birth, ID band Patient awake    Reviewed: Allergy & Precautions, NPO status , Patient's Chart, lab work & pertinent test results  Airway Mallampati: I  TM Distance: >3 FB Neck ROM: Full    Dental   Pulmonary    Pulmonary exam normal        Cardiovascular hypertension, Pt. on medications + CAD  Normal cardiovascular exam+ pacemaker      Neuro/Psych TIA   GI/Hepatic GERD  Medicated and Controlled,  Endo/Other    Renal/GU      Musculoskeletal   Abdominal   Peds  Hematology   Anesthesia Other Findings   Reproductive/Obstetrics                             Anesthesia Physical Anesthesia Plan  ASA: III  Anesthesia Plan: General   Post-op Pain Management:    Induction: Intravenous  PONV Risk Score and Plan: 3 and Ondansetron and Treatment may vary due to age or medical condition  Airway Management Planned: Oral ETT  Additional Equipment:   Intra-op Plan:   Post-operative Plan: Extubation in OR  Informed Consent: I have reviewed the patients History and Physical, chart, labs and discussed the procedure including the risks, benefits and alternatives for the proposed anesthesia with the patient or authorized representative who has indicated his/her understanding and acceptance.     Plan Discussed with: CRNA and Surgeon  Anesthesia Plan Comments:         Anesthesia Quick Evaluation
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
## 2017-02-16 NOTE — Progress Notes (Signed)
OT Cancellation Note  Lease update weight bearing status and ROM allowed as appropriate. Thanks   02/16/17 1300  OT Visit Information  Last OT Received On 02/16/17  Reason Eval/Treat Not Completed Patient at procedure or test/ unavailable (in surgery)  [facility], OT/L  [phone] 02/16/2017

## 2017-02-17 ENCOUNTER — Inpatient Hospital Stay (HOSPITAL_COMMUNITY): Payer: Medicare Other

## 2017-02-17 DIAGNOSIS — S42421A Displaced comminuted supracondylar fracture without intercondylar fracture of right humerus, initial encounter for closed fracture: Secondary | ICD-10-CM

## 2017-02-17 DIAGNOSIS — S72461A Displaced supracondylar fracture with intracondylar extension of lower end of right femur, initial encounter for closed fracture: Secondary | ICD-10-CM

## 2017-02-17 DIAGNOSIS — D72829 Elevated white blood cell count, unspecified: Secondary | ICD-10-CM

## 2017-02-17 DIAGNOSIS — D62 Acute posthemorrhagic anemia: Secondary | ICD-10-CM

## 2017-02-17 DIAGNOSIS — I480 Paroxysmal atrial fibrillation: Secondary | ICD-10-CM

## 2017-02-17 DIAGNOSIS — R7303 Prediabetes: Secondary | ICD-10-CM

## 2017-02-17 DIAGNOSIS — S82041A Displaced comminuted fracture of right patella, initial encounter for closed fracture: Secondary | ICD-10-CM

## 2017-02-17 DIAGNOSIS — G8918 Other acute postprocedural pain: Secondary | ICD-10-CM

## 2017-02-17 DIAGNOSIS — Z9889 Other specified postprocedural states: Secondary | ICD-10-CM

## 2017-02-17 DIAGNOSIS — Z8673 Personal history of transient ischemic attack (TIA), and cerebral infarction without residual deficits: Secondary | ICD-10-CM

## 2017-02-17 DIAGNOSIS — I2510 Atherosclerotic heart disease of native coronary artery without angina pectoris: Secondary | ICD-10-CM

## 2017-02-17 DIAGNOSIS — T148XXA Other injury of unspecified body region, initial encounter: Secondary | ICD-10-CM

## 2017-02-17 LAB — BASIC METABOLIC PANEL
ANION GAP: 4 — AB (ref 5–15)
BUN: 20 mg/dL (ref 6–20)
CALCIUM: 7.4 mg/dL — AB (ref 8.9–10.3)
CHLORIDE: 108 mmol/L (ref 101–111)
CO2: 25 mmol/L (ref 22–32)
Creatinine, Ser: 0.83 mg/dL (ref 0.44–1.00)
GFR calc non Af Amer: 59 mL/min — ABNORMAL LOW (ref >60)
Glucose, Bld: 138 mg/dL — ABNORMAL HIGH (ref 65–99)
Potassium: 4.2 mmol/L (ref 3.5–5.1)
SODIUM: 137 mmol/L (ref 135–145)

## 2017-02-17 LAB — CBC
HEMATOCRIT: 21.3 % — AB (ref 36.0–46.0)
HEMOGLOBIN: 6.9 g/dL — AB (ref 12.0–15.0)
MCH: 30.4 pg (ref 26.0–34.0)
MCHC: 32.4 g/dL (ref 30.0–36.0)
MCV: 93.8 fL (ref 78.0–100.0)
Platelets: 173 10*3/uL (ref 150–400)
RBC: 2.27 MIL/uL — ABNORMAL LOW (ref 3.87–5.11)
RDW: 14.6 % (ref 11.5–15.5)
WBC: 14.2 10*3/uL — AB (ref 4.0–10.5)

## 2017-02-17 LAB — PREPARE RBC (CROSSMATCH)

## 2017-02-17 LAB — HEMOGLOBIN AND HEMATOCRIT, BLOOD
HCT: 30.1 % — ABNORMAL LOW (ref 36.0–46.0)
Hemoglobin: 10.5 g/dL — ABNORMAL LOW (ref 12.0–15.0)

## 2017-02-17 MED ORDER — IOPAMIDOL (ISOVUE-370) INJECTION 76%
INTRAVENOUS | Status: AC
  Administered 2017-02-17: 50 mL
  Filled 2017-02-17: qty 50

## 2017-02-17 MED ORDER — SODIUM CHLORIDE 0.9 % IV SOLN: Freq: Once | INTRAVENOUS | Status: DC

## 2017-02-17 MED ORDER — APIXABAN 2.5 MG PO TABS
2.5000 mg | ORAL_TABLET | Freq: Two times a day (BID) | ORAL | Status: DC
  Administered 2017-02-17 – 2017-02-23 (×13): 2.5 mg via ORAL
  Filled 2017-02-17 (×13): qty 1

## 2017-02-17 MED ORDER — WHITE PETROLATUM EX OINT
TOPICAL_OINTMENT | CUTANEOUS | Status: AC
  Administered 2017-02-17: 09:00:00
  Filled 2017-02-17: qty 28.35

## 2017-02-17 MED ORDER — SODIUM CHLORIDE 0.9 % IV BOLUS (SEPSIS)
500.0000 mL | Freq: Once | INTRAVENOUS | Status: AC
  Administered 2017-02-17: 500 mL via INTRAVENOUS

## 2017-02-17 MED ORDER — METOPROLOL TARTRATE 12.5 MG HALF TABLET
12.5000 mg | ORAL_TABLET | Freq: Two times a day (BID) | ORAL | Status: DC
  Administered 2017-02-17 – 2017-02-23 (×12): 12.5 mg via ORAL
  Filled 2017-02-17 (×12): qty 1

## 2017-02-17 MED ORDER — SODIUM CHLORIDE 0.9 % IV SOLN: Freq: Once | INTRAVENOUS | Status: AC

## 2017-02-17 NOTE — Plan of Care (Signed)
Pain controlled with pca morphine.

## 2017-02-17 NOTE — Plan of Care (Signed)
  Progressing Education: Knowledge of General Education information will improve 02/17/2017 2355 - Progressing by Joya San, RN Health Behavior/Discharge Planning: Ability to manage health-related needs will improve 02/17/2017 2355 - Progressing by Joya San, RN Clinical Measurements: Ability to maintain clinical measurements within normal limits will improve 02/17/2017 2355 - Progressing by Joya San, RN Will remain free from infection 02/17/2017 2355 - Progressing by Joya San, RN Diagnostic test results will improve 02/17/2017 2355 - Progressing by Joya San, RN Respiratory complications will improve 02/17/2017 2355 - Progressing by Joya San, RN Cardiovascular complication will be avoided 02/17/2017 2355 - Progressing by Joya San, RN Activity: Risk for activity intolerance will decrease 02/17/2017 2355 - Progressing by Joya San, RN Nutrition: Adequate nutrition will be maintained 02/17/2017 2355 - Progressing by Joya San, RN Coping: Level of anxiety will decrease 02/17/2017 2355 - Progressing by Joya San, RN Elimination: Will not experience complications related to bowel motility 02/17/2017 2355 - Progressing by Joya San, RN Will not experience complications related to urinary retention 02/17/2017 2355 - Progressing by Joya San, RN Pain Managment: General experience of comfort will improve 02/17/2017 2355 - Progressing by Joya San, RN Safety: Ability to remain free from injury will improve 02/17/2017 2355 - Progressing by Joya San, RN Skin Integrity: Risk for impaired skin integrity will decrease 02/17/2017 2355 - Progressing by Joya San, RN Clinical Measurements: Postoperative complications will be avoided or minimized 02/17/2017 2355 - Progressing by Joya San, RN Skin Integrity: Demonstration of wound healing without infection  will improve 02/17/2017 2355 - Progressing by Joya San, RN

## 2017-02-17 NOTE — Progress Notes (Signed)
I await PT and OT evaluations to begin discussions concerning appropriate rehab venue options. [phone]

## 2017-02-17 NOTE — Progress Notes (Signed)
1900: Pt resting comfortably after returning from OR. Low respiratory rate. Drowsy during assessment; will continue to hold morphine PCA.  2100: Pt denies pain. Respiratory rate has improved and pt is alert during assessment. PCA resumed and pt education enforced.  2300: Pt expressing concerns about foggy mentation and disorientation. Easily reoriented to place, time, and situation. Reassurance provided.  0000: To CT with transporter for angio neck.  0200: Pt resting comfortably. CT reading shows new debris in trachea concerning for aspirate. Pt encouraged to cough. PO intake and incentive spirometry held.   0400: Pt only c/o pain in sternum. Encouraged to use PCA, but does not. -------------------------------------------- CRITICAL VALUE ALERT  Critical Value:  Hb 6.9  Date & Time Notied:  02/16/17 0511  Provider Notified: Family Medicine resident Olene Floss, MD  Orders Received/Actions taken: 1 unit PRBC pended. Will decide whether or not to hang upon morning rounds. --------------------------------------------  0600: Pt resting comfortably.  0700: Handoff report given to RN.

## 2017-02-17 NOTE — Progress Notes (Signed)
PT Cancellation Note  Patient Details Name: Karina [name] MRN: [id number] DOB: 08-03-1922   Cancelled Treatment:    Reason Eval/Treat Not Completed: Medical issues which prohibited therapy(pt with bedrest order and await increased activity order)   [name] [name] 02/17/2017, 7:12 AM  Elwyn Reach, [place]

## 2017-02-17 NOTE — Discharge Instructions (Signed)

## 2017-02-17 NOTE — Progress Notes (Signed)
Family medicine doctor paged regarding pt's low BP 83/50 and 74/45. Dr. Grandville Silos called back and said to hold BP meds and hang 1 unit of PRBC.
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
## 2017-02-17 NOTE — Progress Notes (Signed)
OT Cancellation Note  Patient Details Name: Karina [name] MRN: [id number] DOB: February 11, 1923   Cancelled Treatment:    Reason Eval/Treat Not Completed: Medical issues which prohibited therapy. (pt with bedrest order and await increased activity order)    Almon Register [phone] 02/17/2017, 9:06 AM
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
## 2017-02-17 NOTE — Anesthesia Postprocedure Evaluation (Signed)
Anesthesia Post Note  Patient: Karina [name]  Procedure(s) Performed: OPEN REDUCTION INTERNAL FIXATION (ORIF) DISTAL FEMUR FRACTURE (Right Leg Upper)     Patient location during evaluation: PACU Anesthesia Type: General Level of consciousness: awake and alert Pain management: pain level controlled Vital Signs Assessment: post-procedure vital signs reviewed and stable Respiratory status: spontaneous breathing, nonlabored ventilation, respiratory function stable and patient connected to nasal cannula oxygen Cardiovascular status: blood pressure returned to baseline and stable Postop Assessment: no apparent nausea or vomiting Anesthetic complications: no    Last Vitals:  Vitals:   02/17/17 0735 02/17/17 1006  BP:    Pulse:  73  Resp: 15 14  Temp:  36.8 C  SpO2: 100%     Last Pain:  Vitals:   02/17/17 1006  TempSrc: Oral  PainSc:                  [name] DAVID

## 2017-02-18 ENCOUNTER — Inpatient Hospital Stay (HOSPITAL_COMMUNITY): Payer: Medicare Other

## 2017-02-18 ENCOUNTER — Encounter (HOSPITAL_COMMUNITY): Payer: Self-pay | Admitting: Student

## 2017-02-18 DIAGNOSIS — I482 Chronic atrial fibrillation: Secondary | ICD-10-CM

## 2017-02-18 DIAGNOSIS — I340 Nonrheumatic mitral (valve) insufficiency: Secondary | ICD-10-CM

## 2017-02-18 DIAGNOSIS — I351 Nonrheumatic aortic (valve) insufficiency: Secondary | ICD-10-CM

## 2017-02-18 DIAGNOSIS — R079 Chest pain, unspecified: Secondary | ICD-10-CM

## 2017-02-18 LAB — CBC
HCT: 30.6 % — ABNORMAL LOW (ref 36.0–46.0)
Hemoglobin: 10.4 g/dL — ABNORMAL LOW (ref 12.0–15.0)
MCH: 30.6 pg (ref 26.0–34.0)
MCHC: 34 g/dL (ref 30.0–36.0)
MCV: 90 fL (ref 78.0–100.0)
Platelets: 163 10*3/uL (ref 150–400)
RBC: 3.4 MIL/uL — ABNORMAL LOW (ref 3.87–5.11)
RDW: 14.8 % (ref 11.5–15.5)
WBC: 14.5 10*3/uL — ABNORMAL HIGH (ref 4.0–10.5)

## 2017-02-18 LAB — TYPE AND SCREEN
ABO/RH(D): B POS
ANTIBODY SCREEN: NEGATIVE
UNIT DIVISION: 0
Unit division: 0

## 2017-02-18 LAB — ECHOCARDIOGRAM COMPLETE
AVPHT: 681 ms
FS: 36 % (ref 28–44)
Height: 61 in
IVS/LV PW RATIO, ED: 1.03
LA diam index: 2.85 cm/m2
LA vol A4C: 99.8 ml
LASIZE: 45 mm
LAVOL: 90.4 mL
LAVOLIN: 57.2 mL/m2
LDCA: 2.84 cm2
LEFT ATRIUM END SYS DIAM: 45 mm
LV PW d: 10.4 mm — AB (ref 0.6–1.1)
LVOT SV: 58 mL
LVOT VTI: 20.3 cm
LVOT diameter: 19 mm
LVOT peak grad rest: 6 mmHg
LVOTPV: 121 cm/s
Lateral S' vel: 12 cm/s
RV TAPSE: 19.5 mm
RV sys press: 52 mmHg
Reg peak vel: 351 cm/s
TRMAXVEL: 351 cm/s
Weight: 2112 oz

## 2017-02-18 LAB — BASIC METABOLIC PANEL
Anion gap: 6 (ref 5–15)
BUN: 18 mg/dL (ref 6–20)
CALCIUM: 7.8 mg/dL — AB (ref 8.9–10.3)
CHLORIDE: 109 mmol/L (ref 101–111)
CO2: 20 mmol/L — AB (ref 22–32)
CREATININE: 0.71 mg/dL (ref 0.44–1.00)
GFR calc non Af Amer: >60 mL/min (ref >60)
Glucose, Bld: 113 mg/dL — ABNORMAL HIGH (ref 65–99)
Potassium: 4.1 mmol/L (ref 3.5–5.1)
SODIUM: 135 mmol/L (ref 135–145)

## 2017-02-18 LAB — BPAM RBC
BLOOD PRODUCT EXPIRATION DATE: 201811272359
Blood Product Expiration Date: 201811272359
ISSUE DATE / TIME: 201811131002
ISSUE DATE / TIME: 201811131400
UNIT TYPE AND RH: 7300
Unit Type and Rh: 7300

## 2017-02-18 LAB — TROPONIN I
TROPONIN I: 0.05 ng/mL — AB (ref <0.03)
Troponin I: 0.07 ng/mL (ref <0.03)
Troponin I: 0.11 ng/mL (ref <0.03)

## 2017-02-18 LAB — HEPARIN LEVEL (UNFRACTIONATED)

## 2017-02-18 MED ORDER — ACETAMINOPHEN 325 MG PO TABS
650.0000 mg | ORAL_TABLET | Freq: Three times a day (TID) | ORAL | Status: DC
  Administered 2017-02-18 – 2017-02-23 (×17): 650 mg via ORAL
  Filled 2017-02-18 (×16): qty 2

## 2017-02-18 MED ORDER — OXYCODONE HCL 5 MG PO TABS
5.0000 mg | ORAL_TABLET | ORAL | Status: DC | PRN
  Administered 2017-02-19: 5 mg via ORAL
  Filled 2017-02-18: qty 1

## 2017-02-18 MED ORDER — GI COCKTAIL ~~LOC~~: 30.0000 mL | Freq: Two times a day (BID) | ORAL | Status: DC | PRN

## 2017-02-18 MED ORDER — CLONIDINE HCL 0.1 MG PO TABS
0.2000 mg | ORAL_TABLET | Freq: Every day | ORAL | Status: DC
  Administered 2017-02-18 – 2017-02-22 (×5): 0.2 mg via ORAL
  Filled 2017-02-18 (×6): qty 2

## 2017-02-18 MED ORDER — CLONIDINE HCL 0.1 MG PO TABS
0.1000 mg | ORAL_TABLET | Freq: Every day | ORAL | Status: DC
  Administered 2017-02-18 – 2017-02-23 (×6): 0.1 mg via ORAL
  Filled 2017-02-18 (×5): qty 1

## 2017-02-18 MED ORDER — NITROGLYCERIN 0.4 MG SL SUBL
SUBLINGUAL_TABLET | SUBLINGUAL | Status: AC
  Filled 2017-02-18: qty 1

## 2017-02-18 MED ORDER — LIDOCAINE 5 % EX PTCH
1.0000 | MEDICATED_PATCH | Freq: Two times a day (BID) | CUTANEOUS | Status: DC | PRN
  Administered 2017-02-18: 1 via TRANSDERMAL
  Filled 2017-02-18 (×2): qty 1

## 2017-02-18 MED ORDER — HYDRALAZINE HCL 20 MG/ML IJ SOLN
10.0000 mg | Freq: Four times a day (QID) | INTRAMUSCULAR | Status: DC | PRN
  Administered 2017-02-19 – 2017-02-21 (×2): 10 mg via INTRAVENOUS
  Filled 2017-02-18 (×2): qty 1

## 2017-02-18 MED ORDER — CLONIDINE HCL 0.1 MG PO TABS: 0.1000 mg | ORAL_TABLET | Freq: Two times a day (BID) | ORAL | Status: DC
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
## 2017-02-18 NOTE — Progress Notes (Signed)
OT Cancellation    02/18/17 1200  OT Visit Information  Last OT Received On 02/18/17  Reason Eval/Treat Not Completed Medical issues which prohibited therapy (BP was 211/91 at bed level and complaining of chest pains per PT. Will follow up tomorrow for OT evaluation. Thank you.)   [place], OTR/L Acute Rehab Pager: [phone] Office: [phone]

## 2017-02-18 NOTE — Progress Notes (Signed)
I met with pt and her friend, Vivien Rota at bedside to begin discussions concerning rehab venue options. I will return when son arrives to continue discussions. Pt was completely independent and active pta. She will need caregiver support at home after any rehab which I will discuss with son. I await PT and OT evaluations to assist in planning dispo. [phone]
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
## 2017-02-18 NOTE — Progress Notes (Signed)
I met with pt and her son, [name], at bedside. We discussed her options for rehab. They would prefer an inpt rehab admission with hired caregivers at home after completion of our program if pt felt appropriate. Therapy evaluations have been limited thus far so we discussed my continued assessment before proceeding with insurance authorization I will follow up tomorrow. [phone]
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
## 2017-02-18 NOTE — Progress Notes (Signed)
Patient offering multiple complaints.  States feels "lonely"  And would like a friend to visit.  Call made to son to advise him of her need for companionship.
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
## 2017-02-18 NOTE — Progress Notes (Signed)
  Echocardiogram 2D Echocardiogram has been performed.  Merrie Roof F 02/18/2017, 4:54 PM

## 2017-02-19 LAB — TROPONIN I: TROPONIN I: 0.05 ng/mL — AB (ref <0.03)

## 2017-02-19 LAB — BASIC METABOLIC PANEL
ANION GAP: 8 (ref 5–15)
BUN: 12 mg/dL (ref 6–20)
CALCIUM: 8.2 mg/dL — AB (ref 8.9–10.3)
CO2: 23 mmol/L (ref 22–32)
Chloride: 104 mmol/L (ref 101–111)
Creatinine, Ser: 0.61 mg/dL (ref 0.44–1.00)
GFR calc Af Amer: >60 mL/min (ref >60)
GLUCOSE: 108 mg/dL — AB (ref 65–99)
POTASSIUM: 3.2 mmol/L — AB (ref 3.5–5.1)
Sodium: 135 mmol/L (ref 135–145)

## 2017-02-19 LAB — CBC
HEMATOCRIT: 36.5 % (ref 36.0–46.0)
HEMOGLOBIN: 12.2 g/dL (ref 12.0–15.0)
MCH: 29.6 pg (ref 26.0–34.0)
MCHC: 33.4 g/dL (ref 30.0–36.0)
MCV: 88.6 fL (ref 78.0–100.0)
Platelets: 219 10*3/uL (ref 150–400)
RBC: 4.12 MIL/uL (ref 3.87–5.11)
RDW: 14.2 % (ref 11.5–15.5)
WBC: 14.6 10*3/uL — ABNORMAL HIGH (ref 4.0–10.5)

## 2017-02-19 MED ORDER — OXYCODONE HCL 5 MG PO TABS: 5.0000 mg | ORAL_TABLET | Freq: Two times a day (BID) | ORAL | Status: DC

## 2017-02-19 MED ORDER — OXYCODONE HCL 5 MG PO TABS
5.0000 mg | ORAL_TABLET | ORAL | Status: DC | PRN
  Administered 2017-02-19 – 2017-02-22 (×5): 5 mg via ORAL
  Filled 2017-02-19 (×5): qty 1

## 2017-02-19 MED ORDER — CITALOPRAM HYDROBROMIDE 10 MG PO TABS
10.0000 mg | ORAL_TABLET | Freq: Every day | ORAL | Status: DC
  Administered 2017-02-19: 10 mg via ORAL
  Filled 2017-02-19 (×2): qty 1

## 2017-02-19 MED ORDER — ENSURE ENLIVE PO LIQD
237.0000 mL | Freq: Two times a day (BID) | ORAL | Status: DC
  Administered 2017-02-19 – 2017-02-23 (×8): 237 mL via ORAL

## 2017-02-19 MED ORDER — OXYCODONE HCL ER 10 MG PO T12A
10.0000 mg | EXTENDED_RELEASE_TABLET | Freq: Two times a day (BID) | ORAL | Status: DC
  Administered 2017-02-19 – 2017-02-23 (×9): 10 mg via ORAL
  Filled 2017-02-19 (×9): qty 1

## 2017-02-19 MED ORDER — POTASSIUM CHLORIDE CRYS ER 20 MEQ PO TBCR
20.0000 meq | EXTENDED_RELEASE_TABLET | Freq: Two times a day (BID) | ORAL | Status: DC
  Administered 2017-02-19 – 2017-02-20 (×3): 20 meq via ORAL
  Filled 2017-02-19 (×3): qty 1
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
## 2017-02-19 NOTE — Progress Notes (Signed)
Pt did well with therapy first time out of bed. I await further progress with therapy before pursuing insurance approval for a possible inpt rehab admission. [phone]
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
## 2017-02-19 NOTE — Progress Notes (Signed)
Patient called writer to room complaining of being miserable and wants to die. Patient asked writer to give her something to knock her out of her misery. Writer tried to calm patient and reassured her that her pains are not for ever and it will get better as the days go by. Patient insisted on writer calling son and telling him not to come and that she does not want any visitor. Writer called son and patient spoke with him, telling him she wants to die. Son Advertising copywriter he will be coming to see patient soon. Will continue to monitor and keep her comfortable.

## 2017-02-20 DIAGNOSIS — E119 Type 2 diabetes mellitus without complications: Secondary | ICD-10-CM

## 2017-02-20 LAB — BASIC METABOLIC PANEL
ANION GAP: 7 (ref 5–15)
BUN: 13 mg/dL (ref 6–20)
CHLORIDE: 104 mmol/L (ref 101–111)
CO2: 22 mmol/L (ref 22–32)
Calcium: 8 mg/dL — ABNORMAL LOW (ref 8.9–10.3)
Creatinine, Ser: 0.55 mg/dL (ref 0.44–1.00)
GFR calc Af Amer: >60 mL/min (ref >60)
GFR calc non Af Amer: >60 mL/min (ref >60)
GLUCOSE: 109 mg/dL — AB (ref 65–99)
POTASSIUM: 3.9 mmol/L (ref 3.5–5.1)
Sodium: 133 mmol/L — ABNORMAL LOW (ref 135–145)

## 2017-02-20 LAB — CBC
HEMATOCRIT: 31.3 % — AB (ref 36.0–46.0)
HEMOGLOBIN: 10.4 g/dL — AB (ref 12.0–15.0)
MCH: 29.9 pg (ref 26.0–34.0)
MCHC: 33.2 g/dL (ref 30.0–36.0)
MCV: 89.9 fL (ref 78.0–100.0)
PLATELETS: 211 10*3/uL (ref 150–400)
RBC: 3.48 MIL/uL — AB (ref 3.87–5.11)
RDW: 14.6 % (ref 11.5–15.5)
WBC: 11.2 10*3/uL — ABNORMAL HIGH (ref 4.0–10.5)
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
## 2017-02-20 NOTE — Care Management Important Message (Signed)
Important Message  Patient Details  Name: Karina [name] MRN: [id number] Date of Birth: Sep 27, 1922   Medicare Important Message Given:  Yes    Nathen May 02/20/2017, 12:21 PM

## 2017-02-21 LAB — CBC
HEMATOCRIT: 34.9 % — AB (ref 36.0–46.0)
Hemoglobin: 11.8 g/dL — ABNORMAL LOW (ref 12.0–15.0)
MCH: 30.3 pg (ref 26.0–34.0)
MCHC: 33.8 g/dL (ref 30.0–36.0)
MCV: 89.7 fL (ref 78.0–100.0)
Platelets: 281 10*3/uL (ref 150–400)
RBC: 3.89 MIL/uL (ref 3.87–5.11)
RDW: 14.8 % (ref 11.5–15.5)
WBC: 14.4 10*3/uL — ABNORMAL HIGH (ref 4.0–10.5)

## 2017-02-21 LAB — MAGNESIUM: Magnesium: 1.6 mg/dL — ABNORMAL LOW (ref 1.7–2.4)

## 2017-02-21 MED ORDER — HYDROCHLOROTHIAZIDE 12.5 MG PO CAPS
12.5000 mg | ORAL_CAPSULE | Freq: Every day | ORAL | Status: DC
  Administered 2017-02-21 – 2017-02-23 (×3): 12.5 mg via ORAL
  Filled 2017-02-21 (×3): qty 1

## 2017-02-21 MED ORDER — MIRTAZAPINE 15 MG PO TBDP
15.0000 mg | ORAL_TABLET | Freq: Every day | ORAL | Status: DC
  Administered 2017-02-21 – 2017-02-22 (×2): 15 mg via ORAL
  Filled 2017-02-21 (×3): qty 1
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
## 2017-02-22 LAB — BASIC METABOLIC PANEL
Anion gap: 9 (ref 5–15)
BUN: 12 mg/dL (ref 6–20)
CALCIUM: 9.1 mg/dL (ref 8.9–10.3)
CHLORIDE: 96 mmol/L — AB (ref 101–111)
CO2: 27 mmol/L (ref 22–32)
Creatinine, Ser: 0.62 mg/dL (ref 0.44–1.00)
GFR calc non Af Amer: >60 mL/min (ref >60)
Glucose, Bld: 117 mg/dL — ABNORMAL HIGH (ref 65–99)
Potassium: 4 mmol/L (ref 3.5–5.1)
SODIUM: 132 mmol/L — AB (ref 135–145)

## 2017-02-22 NOTE — Progress Notes (Signed)
Patient educated in depth with multiple conversations today about pain management, comfort management, measures to help self/anxiety. Patient requesting private sitter, pt educated reasons for hospital supplied sitter--pt discussing with son possibility of privately paid sitter or family friend to be with patient at all times.  Patient pain controlled during day with sched/prn meds and repositioning.  Patient states verbal understanding. Continue to educate patient/family.

## 2017-02-22 NOTE — Progress Notes (Signed)
1900: Patient resting comfortably, in good spirits, not complaining of significant pain or requesting pain medicine. Denied wanting to be repositioned or something to drink.   2230: Patient given night time medications and ready to go to sleep. Patient repositioned, left hand placed on pillow with call bell in her hand.    0000: Patient repositioned, very drowsy. Denied need for pain medications or something to drink.   0700: No significant events throughout the night or morning. Patient asleep at shift change and report given to day shift RN.
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
## 2017-02-23 ENCOUNTER — Inpatient Hospital Stay (HOSPITAL_COMMUNITY)
Admission: RE | Admit: 2017-02-23 | Discharge: 2017-03-19 | DRG: 560 | Disposition: A | Discharge status: Skilled Nursing Facility | Payer: Medicare Other | Source: Intra-hospital | Attending: Physical Medicine & Rehabilitation | Admitting: Physical Medicine & Rehabilitation

## 2017-02-23 DIAGNOSIS — Z9889 Other specified postprocedural states: Secondary | ICD-10-CM

## 2017-02-23 DIAGNOSIS — Z90722 Acquired absence of ovaries, bilateral: Secondary | ICD-10-CM

## 2017-02-23 DIAGNOSIS — E785 Hyperlipidemia, unspecified: Secondary | ICD-10-CM

## 2017-02-23 DIAGNOSIS — S12112D Nondisplaced Type II dens fracture, subsequent encounter for fracture with routine healing: Secondary | ICD-10-CM

## 2017-02-23 DIAGNOSIS — I5032 Chronic diastolic (congestive) heart failure: Secondary | ICD-10-CM | POA: Diagnosis present

## 2017-02-23 DIAGNOSIS — N390 Urinary tract infection, site not specified: Secondary | ICD-10-CM | POA: Diagnosis present

## 2017-02-23 DIAGNOSIS — Z981 Arthrodesis status: Secondary | ICD-10-CM

## 2017-02-23 DIAGNOSIS — R5381 Other malaise: Secondary | ICD-10-CM | POA: Diagnosis present

## 2017-02-23 DIAGNOSIS — S42411D Displaced simple supracondylar fracture without intercondylar fracture of right humerus, subsequent encounter for fracture with routine healing: Secondary | ICD-10-CM | POA: Diagnosis not present

## 2017-02-23 DIAGNOSIS — Z955 Presence of coronary angioplasty implant and graft: Secondary | ICD-10-CM | POA: Diagnosis not present

## 2017-02-23 DIAGNOSIS — S82001D Unspecified fracture of right patella, subsequent encounter for closed fracture with routine healing: Secondary | ICD-10-CM

## 2017-02-23 DIAGNOSIS — S7292XG Unspecified fracture of left femur, subsequent encounter for closed fracture with delayed healing: Secondary | ICD-10-CM | POA: Diagnosis not present

## 2017-02-23 DIAGNOSIS — D72829 Elevated white blood cell count, unspecified: Secondary | ICD-10-CM | POA: Diagnosis not present

## 2017-02-23 DIAGNOSIS — D62 Acute posthemorrhagic anemia: Secondary | ICD-10-CM | POA: Diagnosis present

## 2017-02-23 DIAGNOSIS — I130 Hypertensive heart and chronic kidney disease with heart failure and stage 1 through stage 4 chronic kidney disease, or unspecified chronic kidney disease: Secondary | ICD-10-CM | POA: Diagnosis present

## 2017-02-23 DIAGNOSIS — I2510 Atherosclerotic heart disease of native coronary artery without angina pectoris: Secondary | ICD-10-CM | POA: Diagnosis present

## 2017-02-23 DIAGNOSIS — F329 Major depressive disorder, single episode, unspecified: Secondary | ICD-10-CM | POA: Diagnosis not present

## 2017-02-23 DIAGNOSIS — Z950 Presence of cardiac pacemaker: Secondary | ICD-10-CM | POA: Diagnosis not present

## 2017-02-23 DIAGNOSIS — Z7901 Long term (current) use of anticoagulants: Secondary | ICD-10-CM

## 2017-02-23 DIAGNOSIS — K219 Gastro-esophageal reflux disease without esophagitis: Secondary | ICD-10-CM | POA: Diagnosis present

## 2017-02-23 DIAGNOSIS — E46 Unspecified protein-calorie malnutrition: Secondary | ICD-10-CM | POA: Diagnosis not present

## 2017-02-23 DIAGNOSIS — Z7982 Long term (current) use of aspirin: Secondary | ICD-10-CM

## 2017-02-23 DIAGNOSIS — E871 Hypo-osmolality and hyponatremia: Secondary | ICD-10-CM | POA: Diagnosis present

## 2017-02-23 DIAGNOSIS — Z885 Allergy status to narcotic agent status: Secondary | ICD-10-CM | POA: Diagnosis not present

## 2017-02-23 DIAGNOSIS — T07XXXA Unspecified multiple injuries, initial encounter: Secondary | ICD-10-CM

## 2017-02-23 DIAGNOSIS — R8290 Unspecified abnormal findings in urine: Secondary | ICD-10-CM | POA: Diagnosis not present

## 2017-02-23 DIAGNOSIS — N182 Chronic kidney disease, stage 2 (mild): Secondary | ICD-10-CM | POA: Diagnosis present

## 2017-02-23 DIAGNOSIS — E119 Type 2 diabetes mellitus without complications: Secondary | ICD-10-CM

## 2017-02-23 DIAGNOSIS — Z823 Family history of stroke: Secondary | ICD-10-CM

## 2017-02-23 DIAGNOSIS — S42421S Displaced comminuted supracondylar fracture without intercondylar fracture of right humerus, sequela: Secondary | ICD-10-CM

## 2017-02-23 DIAGNOSIS — Z8673 Personal history of transient ischemic attack (TIA), and cerebral infarction without residual deficits: Secondary | ICD-10-CM | POA: Diagnosis not present

## 2017-02-23 DIAGNOSIS — Z888 Allergy status to other drugs, medicaments and biological substances status: Secondary | ICD-10-CM

## 2017-02-23 DIAGNOSIS — Z79899 Other long term (current) drug therapy: Secondary | ICD-10-CM

## 2017-02-23 DIAGNOSIS — I248 Other forms of acute ischemic heart disease: Secondary | ICD-10-CM | POA: Diagnosis present

## 2017-02-23 DIAGNOSIS — S12101D Unspecified nondisplaced fracture of second cervical vertebra, subsequent encounter for fracture with routine healing: Secondary | ICD-10-CM

## 2017-02-23 DIAGNOSIS — S7290XA Unspecified fracture of unspecified femur, initial encounter for closed fracture: Secondary | ICD-10-CM | POA: Diagnosis present

## 2017-02-23 DIAGNOSIS — R4589 Other symptoms and signs involving emotional state: Secondary | ICD-10-CM | POA: Diagnosis present

## 2017-02-23 DIAGNOSIS — S12301D Unspecified nondisplaced fracture of fourth cervical vertebra, subsequent encounter for fracture with routine healing: Secondary | ICD-10-CM

## 2017-02-23 DIAGNOSIS — T148XXA Other injury of unspecified body region, initial encounter: Secondary | ICD-10-CM

## 2017-02-23 DIAGNOSIS — Z90710 Acquired absence of both cervix and uterus: Secondary | ICD-10-CM

## 2017-02-23 DIAGNOSIS — I482 Chronic atrial fibrillation: Secondary | ICD-10-CM | POA: Diagnosis present

## 2017-02-23 DIAGNOSIS — E876 Hypokalemia: Secondary | ICD-10-CM | POA: Diagnosis not present

## 2017-02-23 DIAGNOSIS — M7989 Other specified soft tissue disorders: Secondary | ICD-10-CM | POA: Diagnosis not present

## 2017-02-23 DIAGNOSIS — R32 Unspecified urinary incontinence: Secondary | ICD-10-CM | POA: Diagnosis present

## 2017-02-23 DIAGNOSIS — E8809 Other disorders of plasma-protein metabolism, not elsewhere classified: Secondary | ICD-10-CM | POA: Diagnosis present

## 2017-02-23 DIAGNOSIS — S72401D Unspecified fracture of lower end of right femur, subsequent encounter for closed fracture with routine healing: Principal | ICD-10-CM

## 2017-02-23 DIAGNOSIS — S7292XS Unspecified fracture of left femur, sequela: Secondary | ICD-10-CM | POA: Diagnosis not present

## 2017-02-23 DIAGNOSIS — S82041A Displaced comminuted fracture of right patella, initial encounter for closed fracture: Secondary | ICD-10-CM

## 2017-02-23 DIAGNOSIS — I10 Essential (primary) hypertension: Secondary | ICD-10-CM | POA: Diagnosis not present

## 2017-02-23 DIAGNOSIS — Y92410 Unspecified street and highway as the place of occurrence of the external cause: Secondary | ICD-10-CM | POA: Diagnosis not present

## 2017-02-23 DIAGNOSIS — S42409A Unspecified fracture of lower end of unspecified humerus, initial encounter for closed fracture: Secondary | ICD-10-CM

## 2017-02-23 DIAGNOSIS — S42421D Displaced comminuted supracondylar fracture without intercondylar fracture of right humerus, subsequent encounter for fracture with routine healing: Secondary | ICD-10-CM

## 2017-02-23 DIAGNOSIS — I480 Paroxysmal atrial fibrillation: Secondary | ICD-10-CM | POA: Diagnosis not present

## 2017-02-23 DIAGNOSIS — T1490XA Injury, unspecified, initial encounter: Secondary | ICD-10-CM

## 2017-02-23 DIAGNOSIS — G5791 Unspecified mononeuropathy of right lower limb: Secondary | ICD-10-CM | POA: Diagnosis not present

## 2017-02-23 DIAGNOSIS — R3981 Functional urinary incontinence: Secondary | ICD-10-CM | POA: Diagnosis present

## 2017-02-23 DIAGNOSIS — S72401A Unspecified fracture of lower end of right femur, initial encounter for closed fracture: Secondary | ICD-10-CM

## 2017-02-23 DIAGNOSIS — M810 Age-related osteoporosis without current pathological fracture: Secondary | ICD-10-CM | POA: Diagnosis present

## 2017-02-23 DIAGNOSIS — S72461D Displaced supracondylar fracture with intracondylar extension of lower end of right femur, subsequent encounter for closed fracture with routine healing: Secondary | ICD-10-CM

## 2017-02-23 DIAGNOSIS — M79674 Pain in right toe(s): Secondary | ICD-10-CM

## 2017-02-23 DIAGNOSIS — R5382 Chronic fatigue, unspecified: Secondary | ICD-10-CM | POA: Diagnosis present

## 2017-02-23 DIAGNOSIS — Z8249 Family history of ischemic heart disease and other diseases of the circulatory system: Secondary | ICD-10-CM

## 2017-02-23 DIAGNOSIS — I739 Peripheral vascular disease, unspecified: Secondary | ICD-10-CM | POA: Diagnosis present

## 2017-02-23 LAB — BASIC METABOLIC PANEL
Anion gap: 11 (ref 5–15)
BUN: 14 mg/dL (ref 6–20)
CALCIUM: 8.8 mg/dL — AB (ref 8.9–10.3)
CO2: 26 mmol/L (ref 22–32)
CREATININE: 0.7 mg/dL (ref 0.44–1.00)
Chloride: 95 mmol/L — ABNORMAL LOW (ref 101–111)
Glucose, Bld: 127 mg/dL — ABNORMAL HIGH (ref 65–99)
Potassium: 4.1 mmol/L (ref 3.5–5.1)
Sodium: 132 mmol/L — ABNORMAL LOW (ref 135–145)

## 2017-02-23 MED ORDER — OXYCODONE HCL 5 MG PO TABS
5.0000 mg | ORAL_TABLET | ORAL | Status: DC | PRN
  Administered 2017-02-23 – 2017-03-15 (×24): 5 mg via ORAL
  Filled 2017-02-23 (×27): qty 1

## 2017-02-23 MED ORDER — ENSURE ENLIVE PO LIQD
237.0000 mL | Freq: Two times a day (BID) | ORAL | Status: DC
  Administered 2017-02-24 – 2017-03-19 (×23): 237 mL via ORAL

## 2017-02-23 MED ORDER — APIXABAN 2.5 MG PO TABS
2.5000 mg | ORAL_TABLET | Freq: Two times a day (BID) | ORAL | Status: DC
  Administered 2017-02-23 – 2017-03-19 (×48): 2.5 mg via ORAL
  Filled 2017-02-23 (×49): qty 1

## 2017-02-23 MED ORDER — POLYETHYLENE GLYCOL 3350 17 G PO PACK: 17.0000 g | PACK | Freq: Every day | ORAL | 0 refills | Status: DC | PRN

## 2017-02-23 MED ORDER — LIDOCAINE 5 % EX PTCH
1.0000 | MEDICATED_PATCH | Freq: Two times a day (BID) | CUTANEOUS | Status: DC | PRN
  Administered 2017-03-11: 1 via TRANSDERMAL
  Filled 2017-02-23: qty 1

## 2017-02-23 MED ORDER — CLONIDINE HCL 0.2 MG PO TABS
0.2000 mg | ORAL_TABLET | Freq: Every day | ORAL | Status: DC
  Administered 2017-02-23 – 2017-03-18 (×24): 0.2 mg via ORAL
  Filled 2017-02-23 (×24): qty 1

## 2017-02-23 MED ORDER — OXYCODONE HCL ER 10 MG PO T12A
10.0000 mg | EXTENDED_RELEASE_TABLET | Freq: Two times a day (BID) | ORAL | Status: DC
  Administered 2017-02-23 – 2017-03-08 (×25): 10 mg via ORAL
  Filled 2017-02-23 (×25): qty 1

## 2017-02-23 MED ORDER — PHENOL 1.4 % MT LIQD: 1.0000 | OROMUCOSAL | Status: DC | PRN

## 2017-02-23 MED ORDER — APIXABAN 2.5 MG PO TABS: 2.5000 mg | ORAL_TABLET | Freq: Two times a day (BID) | ORAL | 1 refills | Status: AC

## 2017-02-23 MED ORDER — HYDROCHLOROTHIAZIDE 12.5 MG PO CAPS
12.5000 mg | ORAL_CAPSULE | Freq: Every day | ORAL | Status: DC
  Administered 2017-02-24 – 2017-03-03 (×8): 12.5 mg via ORAL
  Filled 2017-02-23 (×8): qty 1

## 2017-02-23 MED ORDER — CLONIDINE HCL 0.1 MG PO TABS
0.1000 mg | ORAL_TABLET | Freq: Every day | ORAL | Status: DC
  Administered 2017-02-24 – 2017-03-19 (×24): 0.1 mg via ORAL
  Filled 2017-02-23 (×25): qty 1

## 2017-02-23 MED ORDER — METOPROLOL TARTRATE 12.5 MG HALF TABLET
12.5000 mg | ORAL_TABLET | Freq: Two times a day (BID) | ORAL | Status: DC
  Administered 2017-02-23 – 2017-03-19 (×48): 12.5 mg via ORAL
  Filled 2017-02-23 (×52): qty 1

## 2017-02-23 MED ORDER — DILTIAZEM HCL ER COATED BEADS 120 MG PO CP24
120.0000 mg | ORAL_CAPSULE | Freq: Two times a day (BID) | ORAL | Status: DC
  Administered 2017-02-23 – 2017-03-19 (×48): 120 mg via ORAL
  Filled 2017-02-23 (×49): qty 1

## 2017-02-23 MED ORDER — CALCIUM CARBONATE ANTACID 500 MG PO CHEW: 1.0000 | CHEWABLE_TABLET | Freq: Three times a day (TID) | ORAL | Status: DC | PRN

## 2017-02-23 MED ORDER — SENNA 8.6 MG PO TABS
1.0000 | ORAL_TABLET | Freq: Two times a day (BID) | ORAL | Status: DC
  Administered 2017-02-23 – 2017-02-24 (×2): 8.6 mg via ORAL
  Filled 2017-02-23 (×2): qty 1

## 2017-02-23 MED ORDER — OXYCODONE ER 9 MG PO C12A
1.0000 | EXTENDED_RELEASE_CAPSULE | Freq: Two times a day (BID) | ORAL | 0 refills | Status: DC | PRN
Start: 2017-02-23 — End: 2017-03-19

## 2017-02-23 MED ORDER — ROSUVASTATIN CALCIUM 5 MG PO TABS
5.0000 mg | ORAL_TABLET | Freq: Every day | ORAL | Status: DC
  Administered 2017-02-24 – 2017-03-19 (×24): 5 mg via ORAL
  Filled 2017-02-23 (×25): qty 1

## 2017-02-23 MED ORDER — MIRTAZAPINE 15 MG PO TBDP
15.0000 mg | ORAL_TABLET | Freq: Every day | ORAL | Status: DC
  Administered 2017-02-23 – 2017-03-18 (×23): 15 mg via ORAL
  Filled 2017-02-23 (×25): qty 1

## 2017-02-23 MED ORDER — ONDANSETRON HCL 4 MG PO TABS
4.0000 mg | ORAL_TABLET | Freq: Four times a day (QID) | ORAL | Status: DC | PRN
  Administered 2017-02-24 – 2017-03-02 (×5): 4 mg via ORAL
  Filled 2017-02-23 (×6): qty 1

## 2017-02-23 MED ORDER — SORBITOL 70 % SOLN
30.0000 mL | Freq: Every day | Status: DC | PRN
  Administered 2017-02-23: 30 mL via ORAL
  Filled 2017-02-23: qty 30

## 2017-02-23 MED ORDER — PANTOPRAZOLE SODIUM 40 MG PO TBEC
40.0000 mg | DELAYED_RELEASE_TABLET | Freq: Every day | ORAL | Status: DC
  Administered 2017-02-24 – 2017-03-19 (×24): 40 mg via ORAL
  Filled 2017-02-23 (×25): qty 1

## 2017-02-23 MED ORDER — POLYETHYLENE GLYCOL 3350 17 G PO PACK: 17.0000 g | PACK | Freq: Every day | ORAL | Status: DC | PRN

## 2017-02-23 MED ORDER — ONDANSETRON HCL 4 MG/2ML IJ SOLN
4.0000 mg | Freq: Four times a day (QID) | INTRAMUSCULAR | Status: DC | PRN
  Administered 2017-02-26: 4 mg via INTRAVENOUS
  Filled 2017-02-23 (×2): qty 2
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
## 2017-02-23 NOTE — Progress Notes (Signed)
I have insurance approval to admit pt to inpt rehab today. I have notified Attending service, pt, son, RN CM and SW. I will make the arrangements to admit pt today. [phone]
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
## 2017-02-23 NOTE — Clinical Social Work Note (Signed)
Clinical Social Work Assessment  Patient Details  Name: Karina [name] MRN: [id number] Date of Birth: 05-12-1922  Date of referral:  02/23/17               Reason for consult:  Facility Placement                Permission sought to share information with:  Family Supports Permission granted to share information::  Yes, Verbal Permission Granted  Name::     Karina [name]   Agency::     Relationship::     Contact Information:     Housing/Transportation Living arrangements for the past 2 months:  Mobile Home(alone. ) Source of Information:  Patient Patient Interpreter Needed:  None Criminal Activity/Legal Involvement Pertinent to Current Situation/Hospitalization:  No - Comment as needed Significant Relationships:  Adult Children Lives with:    Do you feel safe going back to the place where you live?  Yes Need for family participation in patient care:  Yes (Comment)  Care giving concerns:  CSW spoke with pt at bedside. At this time pt has expressed no concerns to CSW.    Social Worker assessment / plan:  CSW spoke with pt at bedside. During this time pt informed CSW that pt is from home alone. Pt has a son [name]) that is active in pt's care. At this time pt is between CIR and SNF placement for rehab. CSW was informed that insurance appears to be the barrier at this time.   Employment status:  Retired([employer] ) Insurance information:  Other (Comment Required) PT Recommendations:    Information / Referral to community resources:  Acute Rehab, [place]  Patient/Family's Response to care:  At this time pt appears to be unsure as to what is the next step. CSW informed pt that CSW would work pt up for SNF just in case CIR is unable to meet pt needs at the time of discharge.   Patient/Family's Understanding of and Emotional Response to Diagnosis, Current Treatment, and Prognosis:  No further questions or concern have been presented to CSW at this time.   Emotional  Assessment Appearance:  Appears stated age Attitude/Demeanor/Rapport:    Affect (typically observed):  Pleasant Orientation:  Oriented to Self, Oriented to Place, Oriented to  Time, Oriented to Situation Alcohol / Substance use:  Not Applicable Psych involvement (Current and /or in the community):  No (Comment)  Discharge Needs  Concerns to be addressed:  Financial / Insurance Concerns Readmission within the last 30 days:  No Current discharge risk:  None Barriers to Discharge:  No Barriers Identified   Wetzel Bjornstad, [place] 02/23/2017, 10:32 AM

## 2017-02-23 NOTE — Progress Notes (Signed)
Offered Pt food and drink, Pt stated that she is not ready to eat. Lunch tray in room at bedside.
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
## 2017-02-23 NOTE — Progress Notes (Signed)
Tech offered Pt a bath. Pt stated no. Tech suggested a time that would be good to get the bath done (11am-1130) Pt agreed. Tech will check back with Pt around those times.

## 2017-02-23 NOTE — Progress Notes (Signed)
I await insurance approval for possible admit to inpt rehab today or tomorrow. I have discussed with RN CM, SW, pt and son. I will follow up today. [phone]
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
## 2017-02-24 ENCOUNTER — Inpatient Hospital Stay (HOSPITAL_COMMUNITY): Payer: Medicare Other

## 2017-02-24 ENCOUNTER — Inpatient Hospital Stay (HOSPITAL_COMMUNITY): Payer: Medicare Other | Admitting: Occupational Therapy

## 2017-02-24 ENCOUNTER — Inpatient Hospital Stay (HOSPITAL_COMMUNITY): Payer: Medicare Other | Admitting: Speech Pathology

## 2017-02-24 DIAGNOSIS — R4589 Other symptoms and signs involving emotional state: Secondary | ICD-10-CM | POA: Diagnosis present

## 2017-02-24 DIAGNOSIS — E8809 Other disorders of plasma-protein metabolism, not elsewhere classified: Secondary | ICD-10-CM | POA: Diagnosis present

## 2017-02-24 DIAGNOSIS — E46 Unspecified protein-calorie malnutrition: Secondary | ICD-10-CM | POA: Diagnosis present

## 2017-02-24 DIAGNOSIS — D62 Acute posthemorrhagic anemia: Secondary | ICD-10-CM

## 2017-02-24 DIAGNOSIS — D72829 Elevated white blood cell count, unspecified: Secondary | ICD-10-CM

## 2017-02-24 DIAGNOSIS — T07XXXA Unspecified multiple injuries, initial encounter: Secondary | ICD-10-CM | POA: Diagnosis present

## 2017-02-24 DIAGNOSIS — E871 Hypo-osmolality and hyponatremia: Secondary | ICD-10-CM

## 2017-02-24 LAB — CBC WITH DIFFERENTIAL/PLATELET
BASOS ABS: 0 10*3/uL (ref 0.0–0.1)
Basophils Relative: 0 %
EOS ABS: 0.3 10*3/uL (ref 0.0–0.7)
EOS PCT: 2 %
HCT: 33.4 % — ABNORMAL LOW (ref 36.0–46.0)
Hemoglobin: 10.7 g/dL — ABNORMAL LOW (ref 12.0–15.0)
Lymphocytes Relative: 10 %
Lymphs Abs: 1.5 10*3/uL (ref 0.7–4.0)
MCH: 29.7 pg (ref 26.0–34.0)
MCHC: 32 g/dL (ref 30.0–36.0)
MCV: 92.8 fL (ref 78.0–100.0)
MONO ABS: 1.6 10*3/uL — AB (ref 0.1–1.0)
Monocytes Relative: 10 %
Neutro Abs: 12 10*3/uL — ABNORMAL HIGH (ref 1.7–7.7)
Neutrophils Relative %: 78 %
PLATELETS: 361 10*3/uL (ref 150–400)
RBC: 3.6 MIL/uL — AB (ref 3.87–5.11)
RDW: 15.4 % (ref 11.5–15.5)
WBC: 15.3 10*3/uL — AB (ref 4.0–10.5)

## 2017-02-24 LAB — COMPREHENSIVE METABOLIC PANEL
ALT: 18 U/L (ref 14–54)
AST: 24 U/L (ref 15–41)
Albumin: 2.4 g/dL — ABNORMAL LOW (ref 3.5–5.0)
Alkaline Phosphatase: 98 U/L (ref 38–126)
Anion gap: 10 (ref 5–15)
BUN: 16 mg/dL (ref 6–20)
CHLORIDE: 92 mmol/L — AB (ref 101–111)
CO2: 29 mmol/L (ref 22–32)
CREATININE: 0.72 mg/dL (ref 0.44–1.00)
Calcium: 9 mg/dL (ref 8.9–10.3)
GFR calc non Af Amer: >60 mL/min (ref >60)
Glucose, Bld: 128 mg/dL — ABNORMAL HIGH (ref 65–99)
POTASSIUM: 4 mmol/L (ref 3.5–5.1)
SODIUM: 131 mmol/L — AB (ref 135–145)
Total Bilirubin: 1 mg/dL (ref 0.3–1.2)
Total Protein: 5.1 g/dL — ABNORMAL LOW (ref 6.5–8.1)

## 2017-02-24 MED ORDER — FLEET ENEMA 7-19 GM/118ML RE ENEM
1.0000 | ENEMA | Freq: Every day | RECTAL | Status: DC | PRN
  Filled 2017-02-24: qty 1

## 2017-02-24 MED ORDER — POLYETHYLENE GLYCOL 3350 17 G PO PACK
17.0000 g | PACK | Freq: Two times a day (BID) | ORAL | Status: DC
  Administered 2017-02-24 – 2017-03-16 (×25): 17 g via ORAL
  Filled 2017-02-24 (×39): qty 1

## 2017-02-24 MED ORDER — POLYETHYLENE GLYCOL 3350 17 G PO PACK: 17.0000 g | PACK | Freq: Two times a day (BID) | ORAL | Status: DC

## 2017-02-24 MED ORDER — BISACODYL 10 MG RE SUPP
10.0000 mg | Freq: Every day | RECTAL | Status: DC | PRN
  Administered 2017-02-26: 10 mg via RECTAL
  Filled 2017-02-24 (×2): qty 1

## 2017-02-24 MED ORDER — POLYETHYLENE GLYCOL 3350 17 G PO PACK
17.0000 g | PACK | Freq: Two times a day (BID) | ORAL | Status: DC
  Administered 2017-02-24: 17 g via ORAL
  Filled 2017-02-24: qty 1

## 2017-02-24 MED ORDER — PRO-STAT SUGAR FREE PO LIQD
30.0000 mL | Freq: Two times a day (BID) | ORAL | Status: DC
  Administered 2017-02-24 – 2017-03-15 (×28): 30 mL via ORAL
  Filled 2017-02-24 (×40): qty 30

## 2017-02-24 MED ORDER — SORBITOL 70 % SOLN: 30.0000 mL | Freq: Every day | Status: DC | PRN
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
## 2017-02-24 NOTE — Progress Notes (Addendum)
Pt resting in bed quietly. Easily aroused. C/o leg and knee pain and rates 7(10) on numerical scale. Oxycodone scheduled is administered and effective. Educated on need to turn and reposition every 2 hours per facility policy to decrease risk of skin breakdown over prominent areas. Right leg with edema and suture material that is  left OTA with is without any noted s/s of infection via redness, pain, heat or swelling. RUE maintains within the sling per order. Pt is noted to state that "If I have to keep turning I'll just want to die." pt would benefit from consistent social involvement and entertainment. Safety maintained. Will continue to monitor.

## 2017-02-24 NOTE — Progress Notes (Signed)
Spoke with orthopedic physician assistant in regards to knee immobilizer right lower extremity. Advised discontinue knee immobilizer and free range of motion as tolerated
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
## 2017-02-24 NOTE — Progress Notes (Deleted)
Spoke with or throat physician assistant in regards to knee immobilizer right lower extremity. Advised discontinue knee immobilizer and free range of motion as tolerated
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
## 2017-02-24 NOTE — Progress Notes (Signed)
Patient information reviewed and entered into eRehab system by Chelcy Bolda, RN, CRRN, PPS Coordinator.  Information including medical coding and functional independence measure will be reviewed and updated through discharge.     Per nursing patient was given "Data Collection Information Summary for Patients in Inpatient Rehabilitation Facilities with attached "Privacy Act Statement-Health Care Records" upon admission.  

## 2017-02-24 NOTE — Progress Notes (Signed)
Social Work Patient ID: Karina [name], female   DOB: 1923-03-18, 83 y.o.   MRN: [id number]   CSW has attempted to see pt twice without success to introduce self and role of CSW, as well as to complete assessment.  CSW will continue to try to see pt.

## 2017-02-25 ENCOUNTER — Inpatient Hospital Stay (HOSPITAL_COMMUNITY): Payer: Medicare Other

## 2017-02-25 ENCOUNTER — Inpatient Hospital Stay (HOSPITAL_COMMUNITY): Payer: Medicare Other | Admitting: Occupational Therapy

## 2017-02-25 ENCOUNTER — Inpatient Hospital Stay (HOSPITAL_COMMUNITY): Payer: Medicare Other | Admitting: Physical Therapy

## 2017-02-25 DIAGNOSIS — I10 Essential (primary) hypertension: Secondary | ICD-10-CM

## 2017-02-25 DIAGNOSIS — M7989 Other specified soft tissue disorders: Secondary | ICD-10-CM

## 2017-02-25 LAB — URINALYSIS, COMPLETE (UACMP) WITH MICROSCOPIC
BACTERIA UA: NONE SEEN
BILIRUBIN URINE: NEGATIVE
Glucose, UA: NEGATIVE mg/dL
Hgb urine dipstick: NEGATIVE
KETONES UR: NEGATIVE mg/dL
LEUKOCYTES UA: NEGATIVE
NITRITE: NEGATIVE
PH: 7 (ref 5.0–8.0)
Protein, ur: NEGATIVE mg/dL
SQUAMOUS EPITHELIAL / LPF: NONE SEEN
Specific Gravity, Urine: 1.009 (ref 1.005–1.030)

## 2017-02-25 MED ORDER — POLYVINYL ALCOHOL 1.4 % OP SOLN
1.0000 [drp] | OPHTHALMIC | Status: DC | PRN
  Administered 2017-02-25 – 2017-03-02 (×4): 1 [drp] via OPHTHALMIC
  Filled 2017-02-25: qty 15

## 2017-02-25 MED ORDER — CAMPHOR-MENTHOL 0.5-0.5 % EX LOTN
TOPICAL_LOTION | CUTANEOUS | Status: DC | PRN
  Administered 2017-02-25 – 2017-03-02 (×5): via TOPICAL
  Filled 2017-02-25 (×2): qty 222

## 2017-02-25 NOTE — Progress Notes (Signed)
Physical Therapy Session Note  Patient Details  Name: Karina [name] MRN: [id number] Date of Birth: 07-18-1922  Today's Date: 02/25/2017 PT Individual Time: 0903-1000 PT Individual Time Calculation (min): 57 min   Short Term Goals: Week 1:  PT Short Term Goal 1 (Week 1): Pt will perform all bed mobility with mod assist PT Short Term Goal 2 (Week 1): Pt will perform bed<>chair transfers with mod assist PT Short Term Goal 3 (Week 1): Pt will propel w/c with supervision x 150 ft  Skilled Therapeutic Interventions/Progress Updates:    Session 1: Pt seated in w/c upon PT arrival, agreeable to therapy tx and reports pain 7/10. Pt propelled w/c x 100 ft using L UE, mod assist to help steer, pt unable to reach L LE to the floor. Pt transferred from w/c<>mat x 2 with max assist using a slideboard, verbal cues for technique, keeping R LE propped on stool to maintain NWB precautions. Pt transferred sitting<>supine with max assist.  Pt performed Supine therex: 2 x 10 AA SLR on R, 2 x 10 AAROM hip abduction on R, and L LE: 2 x 10 heel slides, 2 x 10 SLR, 2 x 10 hip abduction, 2 x 10 partial single leg bridges. Pt sitting edge of mat worked on leaning laterally and scooting forward with each hip, mod assist. Pt transferred back to w/c and left seated in w/c at end of session with needs in reach.    Therapy Documentation Precautions:  Precautions Precautions: Fall, Cervical Precaution Comments: ROM as tolerated right knee, NWB R Knee Required Braces or Orthoses: Cervical Brace, Sling Cervical Brace: Hard collar, At all times Restrictions Weight Bearing Restrictions: Yes RUE Weight Bearing: Non weight bearing RLE Weight Bearing: Non weight bearing LLE Weight Bearing: Weight bearing as tolerated Other Position/Activity Restrictions: Sling on RUE at all times   See Function Navigator for Current Functional Status.   Therapy/Group: Individual Therapy  Netta Corrigan, PT, DPT 02/25/2017,  7:45 AM

## 2017-02-25 NOTE — Progress Notes (Signed)
Social Work Patient ID: Karina [name], female   DOB: [birth year], 83 y.o.   MRN: [id number]  Met with pt and spoke with son via telephone to discuss team conference goals mod assist level and target discharge 12/14. Discussed pt's need for 24 hr care and option of hiring assistance versus going to NH for a short time then home. He will discuss with jenny-SW upon her return.

## 2017-02-25 NOTE — Progress Notes (Signed)
Occupational Therapy Session Note  Patient Details  Name: Karina [name] MRN: [id number] Date of Birth: 12-31-1922  Today's Date: 02/25/2017 OT Individual Time: 0623-7628 OT Individual Time Calculation (min): 62 min    Short Term Goals: Week 1:  OT Short Term Goal 1 (Week 1): Pt will transition from supine to sit with mod assist in preparation for selfcare tasks.  OT Short Term Goal 2 (Week 1): Pt will complete UB bathing with mod assist sitting EOB.   OT Short Term Goal 3 (Week 1): Pt will donn pullover shirt with mod assist.   OT Short Term Goal 4 (Week 1): Pt will peform LB bathing supine to sit with mod assist.  OT Short Term Goal 5 (Week 1): Pt will perform sliding board transfer from wheelchair to drop arm commode with max assist.   Skilled Therapeutic Interventions/Progress Updates:    Pt completed bathing and dressing at the sink.  She still did not have clothing at this time so donned gripper socks and hospital gowns.  She was able to use the LUE for washing her face and all other body parts she could reach.  Began education on hemi techniques for washing the LUE and under the left arm, but difficult for her to incorporate at this time secondary to IV being in the left forearm.  Pt now with hinged elbow brace on the RUE.  Therapist did not remove and assist with washing right axilla.  Pt washed both upper legs but needed assistance with washing her feet secondary to not being able to reach down to them.  Total assist for doffing and donning new gripper socks.  Pt noted to have rash on her back that nursing was made aware of.  Therapist also took off cervical collar and changed out pads as well.  Pt left at bedside in wheelchair with call button and phone in reach.    Therapy Documentation Precautions:  Precautions Precautions: Fall, Cervical Precaution Comments: ROM as tolerated right knee, NWB R Knee Required Braces or Orthoses: Other Brace/Splint Cervical Brace: Hard collar, At  all times Other Brace/Splint: hinged elbow brace for the right arm Restrictions Weight Bearing Restrictions: Yes RUE Weight Bearing: Non weight bearing RLE Weight Bearing: Non weight bearing LLE Weight Bearing: Weight bearing as tolerated Other Position/Activity Restrictions: Sling on RUE at all times  Pain: Pain Assessment Pain Assessment: 0-10 Pain Score: 5  Pain Type: Acute pain Pain Location: Leg Pain Orientation: Right Pain Descriptors / Indicators: Discomfort ADL: See Function Navigator for Current Functional Status.   Therapy/Group: Individual Therapy  Javontay Vandam OTR/L 02/25/2017, 12:54 PM

## 2017-02-25 NOTE — Progress Notes (Signed)
Speech Language Pathology Daily Session Note  Patient Details  Name: Karina [name] MRN: [id number] Date of Birth: [birth year]  Today's Date: 02/25/2017 SLP Individual Time: 1345-1420 SLP Individual Time Calculation (min): 35 min  Short Term Goals: Week 1: SLP Short Term Goal 1 (Week 1): Pt will recall complex, new information with supevision cues for use of compensatory strategies. SLP Short Term Goal 2 (Week 1): Pt will complete semi-complex tasks with supervision cues for functional problem solving.   SLP Short Term Goal 3 (Week 1): Pt will selectively attend to tasks in a moderately distracting environment for 30 minutes with supervision verbal cues for redirection.    Skilled Therapeutic Interventions: Skilled ST services focused on cognitive skills. SLP assisted with bed pan transfer, pt demonstrated ability to follow directions and express wants/needs Mod I. SLP facilitated semi-complex problem solving utilizing daily math problems from ALFA,pt demonstrated 70% accuracy however given Min A verbal cues 100% accuracy and Min A verbal cues for error awareness. Bedpan was removed and pt left in bed with nurse technician in room. Recommend to continue skilled ST services.      Function:  Eating Eating                 Cognition Comprehension Comprehension assist level: Follows complex conversation/direction with extra time/assistive device  Expression   Expression assist level: Expresses complex ideas: With extra time/assistive device  Social Interaction Social Interaction assist level: Interacts appropriately 90% of the time - Needs monitoring or encouragement for participation or interaction.  Problem Solving Problem solving assist level: Solves basic 75 - 89% of the time/requires cueing 10 - 24% of the time  Memory Memory assist level: Recognizes or recalls 90% of the time/requires cueing < 10% of the time    Pain Pain Assessment Pain Assessment: No/denies pain Pain Score:  5  Pain Type: Acute pain Pain Location: Leg Pain Orientation: Right Pain Descriptors / Indicators: Discomfort  Therapy/Group: Individual Therapy  Rushie Brazel  [facility] 02/25/2017, 2:39 PM

## 2017-02-25 NOTE — Progress Notes (Signed)
Inpatient Rehabilitation Center Individual Statement of Services  Patient Name:  Karina [name]  Date:  02/25/2017  Welcome to the [facility].  Our goal is to provide you with an individualized program based on your diagnosis and situation, designed to meet your specific needs.  With this comprehensive rehabilitation program, you will be expected to participate in at least 3 hours of rehabilitation therapies Monday-Friday, with modified therapy programming on the weekends.  Your rehabilitation program will include the following services:  Physical Therapy (PT), Occupational Therapy (OT), Speech Therapy (ST), 24 hour per day rehabilitation nursing, Neuropsychology, Case Management (Social Worker), Rehabilitation Medicine, Nutrition Services and Pharmacy Services  Weekly team conferences will be held on Wednesdays to discuss your progress.  Your Social Worker will talk with you frequently to get your input and to update you on team discussions.  Team conferences with you and your family in attendance may also be held.  Expected length of stay:  2 to 3 weeks  Overall anticipated outcome: Minimal to Moderate Assistance  Depending on your progress and recovery, your program may change. Your Social Worker will coordinate services and will keep you informed of any changes. Your Social Worker's name and contact numbers are listed  below.  The following services may also be recommended but are not provided by the [facility] will be made to provide these services after discharge if needed.  Arrangements include referral to agencies that provide these services.  Your insurance has been verified to be:  [employer] Your primary doctor is:  Dr. [name]  Pertinent information will be shared with your doctor and your insurance  company.  Social Worker:  Alfonse Alpers, LCSW  [phone] or [phone]  Information discussed with and copy given to patient by: [name], 02/25/2017, 12:52 AM
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
## 2017-02-25 NOTE — Progress Notes (Signed)
Bilateral lower extremity venous duplex completed. Technically limited. No obvious evidence of DVT or superficial thrombosis in the veins able to be evaluated.   [facility], [place] 02/25/2017 5:46 PM
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
## 2017-02-25 NOTE — Progress Notes (Signed)
Physical Therapy Session Note  Patient Details  Name: Karina [name] MRN: [id number] Date of Birth: Mar 02, 1923  Today's Date: 02/25/2017 PT Individual Time: 0800-0900 PT Individual Time Calculation (min): 60 min    Skilled Therapeutic Interventions/Progress Updates:    10 min of session missed today due to therapist attempting to find elevating leg rest in order to transfer pt to w/c.  Session consisted of focusing on improving bed mobility and transfers to decrease burden of care upon D/C from rehab.  Pt performed supine to sit with HOB elevated with assist for sliding R LE and performed bed to chair transfer with sliding board with +2 assist with one person to maintain NWB with R LE.  Pt propelled w/c x 50 ft with mod assist from PT and L UE.  Pt additionally performed ankle pumps b/l with AAROM from therapist and heel slides into knee flexion with AAROM from PT (approx 15 degrees knee flexion present).  Following session, pt left sitting up in w/c with next therapist for session.  Pain pre: 2/10.  BP: 151/77; HR: 72 bpm.  Therapy Documentation Precautions:  Precautions Precautions: Fall, Cervical Precaution Comments: ROM as tolerated right knee, NWB R Knee Required Braces or Orthoses: Cervical Brace, Sling Cervical Brace: Hard collar, At all times Restrictions Weight Bearing Restrictions: Yes RUE Weight Bearing: Non weight bearing RLE Weight Bearing: Non weight bearing LLE Weight Bearing: Weight bearing as tolerated Other Position/Activity Restrictions: Sling on RUE at all times General: PT Amount of Missed Time (min): 10 Minutes PT Missed Treatment Reason: Other (Comment)  See Function Navigator for Current Functional Status.   Therapy/Group: Individual Therapy  Suhaylah Wampole Hilario Quarry 02/25/2017, 11:15 AM

## 2017-02-26 ENCOUNTER — Other Ambulatory Visit: Payer: Self-pay

## 2017-02-26 ENCOUNTER — Encounter (HOSPITAL_COMMUNITY): Payer: Self-pay | Admitting: Emergency Medicine

## 2017-02-26 DIAGNOSIS — I10 Essential (primary) hypertension: Secondary | ICD-10-CM | POA: Diagnosis present

## 2017-02-26 LAB — CBC WITH DIFFERENTIAL/PLATELET
BASOS ABS: 0 10*3/uL (ref 0.0–0.1)
BASOS PCT: 0 %
Eosinophils Absolute: 0.2 10*3/uL (ref 0.0–0.7)
Eosinophils Relative: 1 %
HEMATOCRIT: 35.5 % — AB (ref 36.0–46.0)
HEMOGLOBIN: 11.2 g/dL — AB (ref 12.0–15.0)
Lymphocytes Relative: 11 %
Lymphs Abs: 1.5 10*3/uL (ref 0.7–4.0)
MCH: 30.3 pg (ref 26.0–34.0)
MCHC: 31.5 g/dL (ref 30.0–36.0)
MCV: 95.9 fL (ref 78.0–100.0)
Monocytes Absolute: 1.4 10*3/uL — ABNORMAL HIGH (ref 0.1–1.0)
Monocytes Relative: 11 %
NEUTROS ABS: 10.5 10*3/uL — AB (ref 1.7–7.7)
NEUTROS PCT: 77 %
Platelets: 428 10*3/uL — ABNORMAL HIGH (ref 150–400)
RBC: 3.7 MIL/uL — ABNORMAL LOW (ref 3.87–5.11)
RDW: 15.7 % — AB (ref 11.5–15.5)
WBC: 13.6 10*3/uL — ABNORMAL HIGH (ref 4.0–10.5)

## 2017-02-26 LAB — BASIC METABOLIC PANEL
ANION GAP: 8 (ref 5–15)
BUN: 19 mg/dL (ref 6–20)
CALCIUM: 9.1 mg/dL (ref 8.9–10.3)
CO2: 33 mmol/L — ABNORMAL HIGH (ref 22–32)
Chloride: 91 mmol/L — ABNORMAL LOW (ref 101–111)
Creatinine, Ser: 0.88 mg/dL (ref 0.44–1.00)
GFR, EST NON AFRICAN AMERICAN: 55 mL/min — AB (ref >60)
Glucose, Bld: 139 mg/dL — ABNORMAL HIGH (ref 65–99)
Potassium: 3.9 mmol/L (ref 3.5–5.1)
SODIUM: 132 mmol/L — AB (ref 135–145)

## 2017-02-26 LAB — URINE CULTURE: CULTURE: NO GROWTH

## 2017-02-26 NOTE — Progress Notes (Signed)
[place] PHYSICAL MEDICINE & REHABILITATION     PROGRESS NOTE  Subjective/Complaints:  Pt seen laying in bed this AM.  She states she had periods that were "terrible" yesterday and overnight.  She asks if she is allowed to move her extremities while in bed.  She is looking forward to spending time with family today.   ROS: Denies CP, SOB, N/V/D.  Objective: Vital Signs: Blood pressure 128/62, pulse 79, temperature 97.8 F (36.6 C), temperature source Oral, resp. rate 18, height 5\' 1"  (1.549 m), weight 57 kg (125 lb 10.6 oz), SpO2 97 %. No results found. Recent Labs    02/24/17 0521 02/26/17 0451  WBC 15.3* 13.6*  HGB 10.7* 11.2*  HCT 33.4* 35.5*  PLT 361 428*   Recent Labs    02/24/17 0521 02/26/17 0451  NA 131* 132*  K 4.0 3.9  CL 92* 91*  GLUCOSE 128* 139*  BUN 16 19  CREATININE 0.72 0.88  CALCIUM 9.0 9.1   CBG (last 3)  No results for input(s): GLUCAP in the last 72 hours.  Wt Readings from Last 3 Encounters:  02/25/17 57 kg (125 lb 10.6 oz)  02/15/17 59.9 kg (132 lb)  11/18/16 56.7 kg (125 lb)    Physical Exam:  BP 128/62 (BP Location: Left Arm)   Pulse 79   Temp 97.8 F (36.6 C) (Oral)   Resp 18   Ht 5\' 1"  (1.549 m)   Wt 57 kg (125 lb 10.6 oz)   SpO2 97%   BMI 23.74 kg/m  Constitutional: She appears well-developed and well-nourished. NAD. HENT: Normocephalic and atraumatic.  Eyes: EOM are normal. No discharge.  Neck: C-collar in place  Cardiovascular: Cardiac rate controlled. No JVD. Respiratory: Effort normal and breath sounds normal.  GI: Bowel sounds are normal. Non distended. Musculoskeletal: She exhibits edema and tenderness.  Neurological: She is alert.  Motor: RUE: limited by sling, finger flexors 2/5 LUE: 5/5 proximal to distal RLE: 2-/5 proximal to distal (unchanged) LLE: HF 2/5, KE 2/5, ADF/PF 2-/5 HOH  Skin: Skin is warm and dry. Dressings and incision c/d/i  Psychiatric: Dramatic  Assessment/Plan: 1. Functional deficits  secondary to polytrauma which require 3+ hours per day of interdisciplinary therapy in a comprehensive inpatient rehab setting. Physiatrist is providing close team supervision and 24 hour management of active medical problems listed below. Physiatrist and rehab team continue to assess barriers to discharge/monitor patient progress toward functional and medical goals.  Function:  Bathing Bathing position   Position: Wheelchair/chair at sink  Bathing parts Body parts bathed by patient: Chest, Abdomen, Right upper leg, Left upper leg Body parts bathed by helper: Back, Left lower leg, Right lower leg, Front perineal area, Buttocks  Bathing assist Assist Level: 2 helpers      Upper Body Dressing/Undressing Upper body dressing   What is the patient wearing?: Hospital gown                Upper body assist Assist Level: 2 helpers      Lower Body Dressing/Undressing Lower body dressing   What is the patient wearing?: Non-skid slipper socks           Non-skid slipper socks- Performed by helper: Don/doff right sock, Don/doff left sock                  Lower body assist Assist for lower body dressing: 2 Helpers      Toileting Toileting Toileting activity did not occur: No continent bowel/bladder event  Toileting steps completed by helper: Adjust clothing prior to toileting, Performs perineal hygiene, Adjust clothing after toileting Toileting Assistive Devices: Other (comment)(purwick catheter in place)  Toileting assist Assist level: Two helpers   Transfers Chair/bed transfer Chair/bed transfer activity did not occur: N/A Chair/bed transfer method: Lateral scoot Chair/bed transfer assist level: Maximal assist (Pt 25 - 49%/lift and lower) Chair/bed transfer assistive device: Sliding board     Locomotion Ambulation Ambulation activity did not occur: Safety/medical concerns(NWB R LE/R UE)         Wheelchair Wheelchair activity did not occur: Refused(pt requesting  to stay in bed secondary to painful disimpaction prior to therapy ) Type: Manual Max wheelchair distance: 150 ft Assist Level: Moderate assistance (Pt 50 - 74%)  Cognition Comprehension Comprehension assist level: Follows complex conversation/direction with extra time/assistive device  Expression Expression assist level: Expresses complex ideas: With extra time/assistive device  Social Interaction Social Interaction assist level: Interacts appropriately with others - No medications needed.  Problem Solving Problem solving assist level: Solves basic 75 - 89% of the time/requires cueing 10 - 24% of the time  Memory Memory assist level: Recognizes or recalls 90% of the time/requires cueing < 10% of the time    Medical Problem List and Plan: 1.  Decreased functional mobility secondary to nondisplaced type II odontoid fracture/nondisplaced C2 fracture and right C4-cervical collar at all times. Right distal femur fracture/patella fracture with ORIF and closed treatment of patella fracture 02/16/2017 as well as right supracondylar humerus fracture with hinged elbow brace applied. Nonweightbearing right upper and right lower extremity   Cont CIR 2.  DVT Prophylaxis/Anticoagulation: Eliquis 3. Pain Management: Lidoderm patch, OxyContin sustained release 10 mg twice a day, oxycodone as needed 4. Mood: Celexa 10 mg daily   Will need to inquire with family regarding baseline affect to determine if this is a mild TBI or baseline.  SLP eval, pt not cooperating.  5. Neuropsych: This patient is ?fully capable of making decisions on her own behalf. 6. Skin/Wound Care: Routine skin checks 7. Fluids/Electrolytes/Nutrition: Routine I&O's 8. Acute blood loss anemia.    Hb 11.2 on 11/22   Cont to monitor 9. CAD/PAF/pacemaker 2015. Follow-up cardiology services needed. Patient is followed at [facility]. Cardiac exam 120 mg twice a day, clonidine 0.1 mg daily and 0.2 mg daily at bedtime, Lopressor 12.5 mg  twice a day. Cardiac rate controlled 10. Hyperlipidemia. Crestor 11. GERD. Protonix 12. Hyponatremia   Na+ 132 on 11/22, stable   Cont to monitor 13. Hypoalbuminemia   Supplement initiated 11/20 14. Leukocytosis   WBCs 13.6 on 11/22, improving   Cont to monitor   Afebrile   UA unremarkable 15. HTN   Cont HCTZ   Will consider adjustment in accordance with Na+   Controlled on 11/22   LOS (Days) 3 A FACE TO FACE EVALUATION WAS PERFORMED  Ankit Lorie Phenix 02/26/2017 8:55 AM

## 2017-02-26 NOTE — Progress Notes (Signed)
Pt with continued nausea / emesis this shift that is brown and malodorous. Zofran 4mg  IVP administered. Awaiting effectiveness. Will continue to monitor.

## 2017-02-27 ENCOUNTER — Inpatient Hospital Stay (HOSPITAL_COMMUNITY): Payer: Medicare Other | Admitting: Physical Therapy

## 2017-02-27 ENCOUNTER — Inpatient Hospital Stay (HOSPITAL_COMMUNITY): Payer: Medicare Other | Admitting: Speech Pathology

## 2017-02-27 ENCOUNTER — Inpatient Hospital Stay (HOSPITAL_COMMUNITY): Payer: Medicare Other | Admitting: Occupational Therapy

## 2017-02-27 NOTE — Progress Notes (Signed)
Speech Language Pathology Daily Session Note  Patient Details  Name: Karina [name] MRN: [id number] Date of Birth: 12/24/1922  Today's Date: 02/27/2017 SLP Individual Time: 0800-0900 SLP Individual Time Calculation (min): 60 min  Short Term Goals: Week 1: SLP Short Term Goal 1 (Week 1): Pt will recall complex, new information with supevision cues for use of compensatory strategies. SLP Short Term Goal 2 (Week 1): Pt will complete semi-complex tasks with supervision cues for functional problem solving.   SLP Short Term Goal 3 (Week 1): Pt will selectively attend to tasks in a moderately distracting environment for 30 minutes with supervision verbal cues for redirection.    Skilled Therapeutic Interventions:  Pt was seen for skilled ST targeting cognitive goals.  SLP facilitated the session with a medication management task to address recall of new information.  Pt needed supervision verbal cues to recall function and frequency of medications when named.  Pt verbalized that she felt that she a very solid foundation in medication management prior to admission and she felt less confident in her ability to do that now given that she is now taking some new medications.  Pt was left in bed with bed alarm set and call bell within reach.  Continue per current plan of care.    Function:  Eating Eating                 Cognition Comprehension Comprehension assist level: Follows complex conversation/direction with extra time/assistive device  Expression   Expression assist level: Expresses complex ideas: With extra time/assistive device  Social Interaction Social Interaction assist level: Interacts appropriately with others - No medications needed.  Problem Solving Problem solving assist level: Solves basic 90% of the time/requires cueing < 10% of the time  Memory Memory assist level: Recognizes or recalls 90% of the time/requires cueing < 10% of the time    Pain Pain Assessment Pain  Assessment: No/denies pain  Therapy/Group: Individual Therapy  Toussaint Golson, Selinda Orion 02/27/2017, 11:13 AM

## 2017-02-27 NOTE — Progress Notes (Signed)
[place] PHYSICAL MEDICINE & REHABILITATION     PROGRESS NOTE  Subjective/Complaints:  Pt seen laying in bed this Am.  She slept fairly overnight.  She is still sleepy this Am and would like to go back to sleep.   ROS: Denies CP, SOB, N/V/D.  Objective: Vital Signs: Blood pressure 133/76, pulse 80, temperature 98.2 F (36.8 C), temperature source Oral, resp. rate 18, height 5\' 1"  (1.549 m), weight 57 kg (125 lb 10.6 oz), SpO2 98 %. No results found. Recent Labs    02/26/17 0451  WBC 13.6*  HGB 11.2*  HCT 35.5*  PLT 428*   Recent Labs    02/26/17 0451  NA 132*  K 3.9  CL 91*  GLUCOSE 139*  BUN 19  CREATININE 0.88  CALCIUM 9.1   CBG (last 3)  No results for input(s): GLUCAP in the last 72 hours.  Wt Readings from Last 3 Encounters:  02/25/17 57 kg (125 lb 10.6 oz)  02/15/17 59.9 kg (132 lb)  11/18/16 56.7 kg (125 lb)    Physical Exam:  BP 133/76 (BP Location: Left Arm)   Pulse 80   Temp 98.2 F (36.8 C) (Oral)   Resp 18   Ht 5\' 1"  (1.549 m)   Wt 57 kg (125 lb 10.6 oz)   SpO2 98%   BMI 23.74 kg/m  Constitutional: She appears well-developed and well-nourished. NAD. HENT: Normocephalic and atraumatic.  Eyes: EOM are normal. No discharge.  Neck: C-collar in place  Cardiovascular: Cardiac rate controlled. No JVD. Respiratory: Effort normal and breath sounds normal.  GI: Bowel sounds are normal. Non distended. Musculoskeletal: She exhibits edema and tenderness.  Neurological: She is alert.  Motor: RUE: limited by sling, finger flexors 2/5 LUE: 5/5 proximal to distal RLE: 2-/5 proximal to distal (unchanged) LLE: HF 2/5, KE 2/5, ADF/PF 2-/5 (unchanged) HOH  Skin: Skin is warm and dry. Dressings and incision c/d/i  Psychiatric: Dramatic  Assessment/Plan: 1. Functional deficits secondary to polytrauma which require 3+ hours per day of interdisciplinary therapy in a comprehensive inpatient rehab setting. Physiatrist is providing close team supervision and  24 hour management of active medical problems listed below. Physiatrist and rehab team continue to assess barriers to discharge/monitor patient progress toward functional and medical goals.  Function:  Bathing Bathing position   Position: Wheelchair/chair at sink  Bathing parts Body parts bathed by patient: Chest, Abdomen, Right upper leg, Left upper leg Body parts bathed by helper: Back, Left lower leg, Right lower leg, Front perineal area, Buttocks  Bathing assist Assist Level: 2 helpers      Upper Body Dressing/Undressing Upper body dressing   What is the patient wearing?: Hospital gown                Upper body assist Assist Level: 2 helpers      Lower Body Dressing/Undressing Lower body dressing   What is the patient wearing?: Non-skid slipper socks           Non-skid slipper socks- Performed by helper: Don/doff right sock, Don/doff left sock                  Lower body assist Assist for lower body dressing: 2 Helpers      Toileting Toileting Toileting activity did not occur: No continent bowel/bladder event   Toileting steps completed by helper: Adjust clothing prior to toileting, Performs perineal hygiene, Adjust clothing after toileting Toileting Assistive Devices: Other (comment)(purwick catheter in place)  Toileting assist Assist level:  Two helpers   Transfers Chair/bed transfer Chair/bed transfer activity did not occur: N/A Chair/bed transfer method: Lateral scoot Chair/bed transfer assist level: Maximal assist (Pt 25 - 49%/lift and lower) Chair/bed transfer assistive device: Sliding board     Locomotion Ambulation Ambulation activity did not occur: Safety/medical concerns(NWB R LE/R UE)         Wheelchair Wheelchair activity did not occur: Refused(pt requesting to stay in bed secondary to painful disimpaction prior to therapy ) Type: Manual Max wheelchair distance: 150 ft Assist Level: Moderate assistance (Pt 50 - 74%)   Cognition Comprehension Comprehension assist level: Follows complex conversation/direction with extra time/assistive device  Expression Expression assist level: Expresses complex ideas: With extra time/assistive device  Social Interaction Social Interaction assist level: Interacts appropriately with others - No medications needed.  Problem Solving Problem solving assist level: Solves basic 75 - 89% of the time/requires cueing 10 - 24% of the time  Memory Memory assist level: Recognizes or recalls 90% of the time/requires cueing < 10% of the time    Medical Problem List and Plan: 1.  Decreased functional mobility secondary to nondisplaced type II odontoid fracture/nondisplaced C2 fracture and right C4-cervical collar at all times. Right distal femur fracture/patella fracture with ORIF and closed treatment of patella fracture 02/16/2017 as well as right supracondylar humerus fracture with hinged elbow brace applied. Nonweightbearing right upper and right lower extremity   Cont CIR 2.  DVT Prophylaxis/Anticoagulation: Eliquis 3. Pain Management: Lidoderm patch, OxyContin sustained release 10 mg twice a day, oxycodone as needed 4. Mood: Celexa 10 mg daily   Will need to inquire with family regarding baseline affect to determine if this is a mild TBI or baseline.  SLP eval, pt not cooperating.  5. Neuropsych: This patient is ?fully capable of making decisions on her own behalf. 6. Skin/Wound Care: Routine skin checks 7. Fluids/Electrolytes/Nutrition: Routine I&O's 8. Acute blood loss anemia.    Hb 11.2 on 11/22  Labs ordered for Monday   Cont to monitor 9. CAD/PAF/pacemaker 2015. Follow-up cardiology services needed. Patient is followed at [facility]. Cardiac exam 120 mg twice a day, clonidine 0.1 mg daily and 0.2 mg daily at bedtime, Lopressor 12.5 mg twice a day. Cardiac rate controlled 10. Hyperlipidemia. Crestor 11. GERD. Protonix 12. Hyponatremia   Na+ 132 on 11/22, stable  Labs  ordered for Monday   Cont to monitor 13. Hypoalbuminemia   Supplement initiated 11/20 14. Leukocytosis   WBCs 13.6 on 11/22, improving  Labs ordered for Monday   Cont to monitor   Afebrile   UA unremarkable 15. HTN   Cont HCTZ   Will consider adjustment in accordance with Na+   Controlled on 11/23   LOS (Days) 4 A FACE TO FACE EVALUATION WAS PERFORMED  Megha Agnes Lorie Phenix 02/27/2017 8:09 AM

## 2017-02-27 NOTE — Progress Notes (Signed)
Physical Therapy Session Note  Patient Details  Name: Karina [name] MRN: [id number] Date of Birth: 1922-09-17  Today's Date: 02/27/2017 PT Individual Time: 5681-2751 PT Individual Time Calculation (min): 50 min  PT Individual Missed Time (min): 10 min (pain)  Short Term Goals: Week 1:  PT Short Term Goal 1 (Week 1): Pt will perform all bed mobility with mod assist PT Short Term Goal 2 (Week 1): Pt will perform bed<>chair transfers with mod assist PT Short Term Goal 3 (Week 1): Pt will propel w/c with supervision x 150 ft  Skilled Therapeutic Interventions/Progress Updates:   Pt supine upon arrival and agreeable to therapy, c/o some R knee pain but tolerable - unable to quantify. Worked on RLE strengthening this session in supine or propped in recliner. Pt performed quad set 2x10,  assisted SLR 2x10, assisted abduction/adduction slides 2x10, and assisted ankle pumps 2x10. Additionally performed passive R gastroc stretch in 15-20 sec bouts in between exercises. Transferred to EOB w/ Max A via log-roll and performed short arc quads 2x10 and passive R quad stretch within pain tolerance 0-25 deg knee flexion. Transferred to recliner via slide board transfer w/ Total A 2/2 pain and RLE management. Educated pt on spending time sitting upright to increase tolerance to OOB activity. Repositioned for comfort and safety in chair. Ended session in recliner w/ call bell within reach and in care of family, all needs met. Ice applied to R knee and NT made aware of status.   Therapy Documentation Precautions:  Precautions Precautions: Fall, Cervical Precaution Comments: ROM as tolerated right knee, NWB R Knee Required Braces or Orthoses: Other Brace/Splint Cervical Brace: Hard collar, At all times Other Brace/Splint: hinged elbow brace for the right arm Restrictions Weight Bearing Restrictions: Yes RUE Weight Bearing: Non weight bearing RLE Weight Bearing: Non weight bearing LLE Weight Bearing:  Weight bearing as tolerated Other Position/Activity Restrictions: Sling on RUE at all times General: PT Amount of Missed Time (min): 10 Minutes PT Missed Treatment Reason: Pain Vital Signs: Therapy Vitals Temp: 99.2 F (37.3 C) Temp Source: Oral Pulse Rate: 76 Resp: 17 BP: 127/72 Patient Position (if appropriate): Lying Oxygen Therapy SpO2: 99 % O2 Device: Not Delivered Pain: Pain Assessment Pain Assessment: 0-10 Pain Score: 7  Pain Type: Acute pain Pain Location: Neck Pain Orientation: Posterior Pain Descriptors / Indicators: Aching Pain Frequency: Intermittent Pain Onset: Gradual Pain Intervention(s): Medication (See eMAR)  See Function Navigator for Current Functional Status.   Therapy/Group: Individual Therapy  Kylen Schliep K Arnette 02/27/2017, 4:53 PM

## 2017-02-27 NOTE — Progress Notes (Signed)
Pt resting in bed quietly. Easily aroused. C/O right leg pain and right rib pain. Pain management administered and effective. Pt educated on need to prevent constipation with frequent and consistent BMs. Dulcolax suppository administered and effective. Pt states "Karina [name] I don't think I'm going to make it with this broken neck and all of these broken ribs." Pt educated to focus on little bits of the situation and not on the entire pciture all at once because healing will take some time. Encouraged to try and rest as much as possible this shift. Oxycodone and zofran is administered per request to prevent emesis this shift and is effective. Abdomen is noted with some distention. Pt c/o feeling like her vagina is clogged as well. Bladder scan completed which was 59ml after pt voided via incontinence. calbell within reach. Safety maintained. Will continue to monitor.

## 2017-02-27 NOTE — Progress Notes (Signed)
Occupational Therapy Session Note  Patient Details  Name: Kimmerly Lora MRN: [id number] Date of Birth: 01-16-1923  Today's Date: 02/27/2017 OT Individual Time: 1000-1116 OT Individual Time Calculation (min): 76 min    Short Term Goals: Week 1:  OT Short Term Goal 1 (Week 1): Pt will transition from supine to sit with mod assist in preparation for selfcare tasks.  OT Short Term Goal 2 (Week 1): Pt will complete UB bathing with mod assist sitting EOB.   OT Short Term Goal 3 (Week 1): Pt will donn pullover shirt with mod assist.   OT Short Term Goal 4 (Week 1): Pt will peform LB bathing supine to sit with mod assist.  OT Short Term Goal 5 (Week 1): Pt will perform sliding board transfer from wheelchair to drop arm commode with max assist.   Skilled Therapeutic Interventions/Progress Updates:    Pt completed bathing and dressing supine to sit.  Max assist for washing peri area, with pt assisting to wash front.  Rolling side to side with mod assist overall for washing buttocks and for therapist to donn new brief.  Elevated HOB for pt to wash parts of her left and right upper legs.  Therapist assisted with lower legs and feet.  Total assist needed to donn pull up pants supine rolling side to side.  She transitioned to sitting with max assist to work on washing and dressing UB.  Therapist removed right arm splint for washing with instruction to pt to not move the arm.  Max assist for washing the left arm but pt was able to completed the right as well as abdomen and chest.  Min assist for balance secondary to left lean in sitting.  Donned button up with max assist beginning with the RUE first.  Transferred to the wheelchair with total assist via sliding board.  Pt left at bedside with phone and call button in reach. Nursing made aware of rash on pt's back and pt currently not having hypoallergenic linen.    Therapy Documentation Precautions:  Precautions Precautions: Fall, Cervical Precaution  Comments: ROM as tolerated right knee, NWB R Knee Required Braces or Orthoses: Other Brace/Splint Cervical Brace: Hard collar, At all times Other Brace/Splint: hinged elbow brace for the right arm Restrictions Weight Bearing Restrictions: Yes RUE Weight Bearing: Non weight bearing RLE Weight Bearing: Non weight bearing LLE Weight Bearing: Weight bearing as tolerated Other Position/Activity Restrictions: Sling on RUE at all times  Pain: Pain Assessment Pain Assessment: No/denies pain Faces Pain Scale: Hurts a little bit Pain Type: Surgical pain Pain Location: Leg Pain Orientation: Right Pain Descriptors / Indicators: Discomfort Pain Intervention(s): Repositioned ADL: See Function Navigator for Current Functional Status.   Therapy/Group: Individual Therapy  Jaris Kohles 02/27/2017, 12:37 PM

## 2017-02-28 ENCOUNTER — Inpatient Hospital Stay (HOSPITAL_COMMUNITY): Payer: Medicare Other | Admitting: Physical Therapy

## 2017-02-28 ENCOUNTER — Inpatient Hospital Stay (HOSPITAL_COMMUNITY): Payer: Medicare Other | Admitting: Occupational Therapy

## 2017-02-28 MED ORDER — WHITE PETROLATUM EX OINT
TOPICAL_OINTMENT | CUTANEOUS | Status: AC
  Administered 2017-02-28: 23:00:00
  Filled 2017-02-28: qty 28.35

## 2017-02-28 NOTE — Progress Notes (Signed)
Occupational Therapy Session Note  Patient Details  Name: Karina [name] MRN: [id number] Date of Birth: 09-Sep-1922  Today's Date: 02/28/2017 OT Individual Time: 0901-1002 OT Individual Time Calculation (min): 61 min    Short Term Goals: Week 1:  OT Short Term Goal 1 (Week 1): Pt will transition from supine to sit with mod assist in preparation for selfcare tasks.  OT Short Term Goal 2 (Week 1): Pt will complete UB bathing with mod assist sitting EOB.   OT Short Term Goal 3 (Week 1): Pt will donn pullover shirt with mod assist.   OT Short Term Goal 4 (Week 1): Pt will peform LB bathing supine to sit with mod assist.  OT Short Term Goal 5 (Week 1): Pt will perform sliding board transfer from wheelchair to drop arm commode with max assist.   Skilled Therapeutic Interventions/Progress Updates:    Pt completed LB bathing and dressing in supine to start session.  Total assist for all aspects with pt rolling side to side in order for therapist to assist washing peri area and buttocks.  Elevated HOB to allow for pt to wash front peri area but unable to reach completely so mod assist needed.  Mod facilitation for rolling side to side with max demonstrational cueing to sequence.  All LB clothing donned in supine as well rolling side to side with total assist.  She was able to transition from left sidelying to sitting with total assist and complete sliding board transfer to the wheelchair at the same level.  Pt left with NT to assist with completion of UB selfcare as time did not allow.   Therapy Documentation Precautions:  Precautions Precautions: Fall, Cervical Precaution Comments: ROM as tolerated right knee, NWB R Knee Required Braces or Orthoses: Other Brace/Splint Cervical Brace: Hard collar, At all times Other Brace/Splint: hinged elbow brace for the right arm Restrictions Weight Bearing Restrictions: Yes RUE Weight Bearing: Non weight bearing RLE Weight Bearing: Non weight bearing LLE  Weight Bearing: Weight bearing as tolerated Other Position/Activity Restrictions: Sling on RUE at all times  Pain: Pain Assessment Pain Assessment: Faces Pain Score: 3  Faces Pain Scale: Hurts a little bit Pain Type: Surgical pain Pain Location: Leg Pain Orientation: Right Pain Descriptors / Indicators: Discomfort Pain Onset: With Activity Pain Intervention(s): Repositioned;Emotional support ADL: See Function Navigator for Current Functional Status.   Therapy/Group: Individual Therapy  Kaytee Taliercio OTR/L 02/28/2017, 10:24 AM

## 2017-02-28 NOTE — Plan of Care (Signed)
  Progressing RH BOWEL ELIMINATION RH STG MANAGE BOWEL WITH ASSISTANCE Description STG Manage Bowel with min Assistance.  02/28/2017 2325 - Progressing by Cornell Barman, RN RH STG MANAGE BOWEL W/MEDICATION W/ASSISTANCE Description STG Manage Bowel with Medication with min Assistance.  02/28/2017 2325 - Progressing by Cornell Barman, RN RH BLADDER ELIMINATION RH STG MANAGE BLADDER WITH ASSISTANCE Description STG Manage Bladder With min Assistance  02/28/2017 2325 - Progressing by Cornell Barman, RN [facility] SKIN INTEGRITY RH STG SKIN FREE OF INFECTION/BREAKDOWN 02/28/2017 2325 - Progressing by Cornell Barman, RN RH STG MAINTAIN SKIN INTEGRITY WITH ASSISTANCE Description STG Maintain Skin Integrity With max Assistance.  02/28/2017 2325 - Progressing by Cornell Barman, RN RH STG ABLE TO PERFORM INCISION/WOUND CARE W/ASSISTANCE Description STG Able To Perform Incision/Wound Care With max Assistance.  02/28/2017 2325 - Progressing by Cornell Barman, RN RH SAFETY RH STG ADHERE TO SAFETY PRECAUTIONS W/ASSISTANCE/DEVICE Description STG Adhere to Safety Precautions With min  Assistance/Device.  02/28/2017 2325 - Progressing by Cornell Barman, RN RH STG DECREASED RISK OF FALL WITH ASSISTANCE Description STG Decreased Risk of Fall With mod Assistance.  02/28/2017 2325 - Progressing by Cornell Barman, RN RH PAIN MANAGEMENT RH STG PAIN MANAGED AT OR BELOW PT'S PAIN GOAL Description Managed at goal 3/10  02/28/2017 2325 - Progressing by Cornell Barman, RN

## 2017-02-28 NOTE — Progress Notes (Signed)
[facility]ICINE & REHABILITATION     PROGRESS NOTE  Subjective/Complaints:  No new problems overnight.  Seems to be a little anxious about being left alone in room.  Anxious to participate in therapy  ROS: pt denies nausea, vomiting, diarrhea, cough, shortness of breath or chest pain   Objective: Vital Signs: Blood pressure 120/63, pulse 75, temperature 98.1 F (36.7 C), temperature source Oral, resp. rate 18, height 5\' 1"  (1.549 m), weight 57 kg (125 lb 10.6 oz), SpO2 94 %. No results found. Recent Labs    02/26/17 0451  WBC 13.6*  HGB 11.2*  HCT 35.5*  PLT 428*   Recent Labs    02/26/17 0451  NA 132*  K 3.9  CL 91*  GLUCOSE 139*  BUN 19  CREATININE 0.88  CALCIUM 9.1   CBG (last 3)  No results for input(s): GLUCAP in the last 72 hours.  Wt Readings from Last 3 Encounters:  02/25/17 57 kg (125 lb 10.6 oz)  02/15/17 59.9 kg (132 lb)  11/18/16 56.7 kg (125 lb)    Physical Exam:  BP 120/63   Pulse 75   Temp 98.1 F (36.7 C) (Oral)   Resp 18   Ht 5\' 1"  (1.549 m)   Wt 57 kg (125 lb 10.6 oz)   SpO2 94%   BMI 23.74 kg/m  Constitutional: She appears well-developed and well-nourished. NAD. HENT: Normocephalic and atraumatic.  Eyes: EOM are normal. No discharge.  Neck: C-collar in place  Cardiovascular: Regular rate and rhythm without JVD. Respiratory: Effort normal and breath sounds normal.  GI: Bowel sounds are normal. Non distended. Musculoskeletal: She exhibits edema and tenderness.  Neurological: She is alert.  Motor: RUE: limited by sling, finger flexors 2/5 LUE: 5/5 proximal to distal RLE: 2-/5 proximal to distal (unchanged) LLE: HF 2/5, KE 2/5, ADF/PF 2-/5 (unchanged) HOH  Skin: Skin is warm and dry. Dressings and incision c/d/i  Psychiatric: Anxious and somewhat perseverative  Assessment/Plan: 1. Functional deficits secondary to polytrauma which require 3+ hours per day of interdisciplinary therapy in a comprehensive inpatient rehab  setting. Physiatrist is providing close team supervision and 24 hour management of active medical problems listed below. Physiatrist and rehab team continue to assess barriers to discharge/monitor patient progress toward functional and medical goals.  Function:  Bathing Bathing position   Position: Bed  Bathing parts Body parts bathed by patient: Right arm, Chest, Abdomen, Front perineal area, Right upper leg, Left upper leg Body parts bathed by helper: Front perineal area, Buttocks, Right upper leg, Left upper leg, Right lower leg(did not have time to complete UB during session.  NT to assist)  Bathing assist Assist Level: 2 helpers      Upper Body Dressing/Undressing Upper body dressing   What is the patient wearing?: Button up shirt           Button up shirt - Perfomed by helper: Thread/unthread right sleeve, Thread/unthread left sleeve, Pull shirt around back, Button/unbutton shirt    Upper body assist Assist Level: 2 helpers      Lower Body Dressing/Undressing Lower body dressing   What is the patient wearing?: Pants, Ted Hose, Non-skid slipper socks       Pants- Performed by helper: Thread/unthread right pants leg, Thread/unthread left pants leg, Pull pants up/down   Non-skid slipper socks- Performed by helper: Don/doff right sock, Don/doff left sock               TED Hose - Performed by helper:  Don/doff right TED hose, Don/doff left TED hose  Lower body assist Assist for lower body dressing: 2 Helpers      Toileting Toileting Toileting activity did not occur: No continent bowel/bladder event   Toileting steps completed by helper: Adjust clothing prior to toileting, Performs perineal hygiene, Adjust clothing after toileting Toileting Assistive Devices: Other (comment)(purwick catheter in place)  Toileting assist Assist level: Two helpers   Transfers Chair/bed transfer Chair/bed transfer activity did not occur: N/A Chair/bed transfer method: Lateral  scoot Chair/bed transfer assist level: Total assist (Pt < 25%) Chair/bed transfer assistive device: Sliding board     Locomotion Ambulation Ambulation activity did not occur: Safety/medical concerns(NWB R LE/R UE)         Wheelchair Wheelchair activity did not occur: Refused(pt requesting to stay in bed secondary to painful disimpaction prior to therapy ) Type: Manual Max wheelchair distance: 150 ft Assist Level: Moderate assistance (Pt 50 - 74%)  Cognition Comprehension Comprehension assist level: Follows basic conversation/direction with extra time/assistive device  Expression Expression assist level: Expresses basic needs/ideas: With extra time/assistive device  Social Interaction Social Interaction assist level: Interacts appropriately 90% of the time - Needs monitoring or encouragement for participation or interaction.  Problem Solving Problem solving assist level: Solves basic 90% of the time/requires cueing < 10% of the time  Memory Memory assist level: Recognizes or recalls 75 - 89% of the time/requires cueing 10 - 24% of the time    Medical Problem List and Plan: 1.  Decreased functional mobility secondary to nondisplaced type II odontoid fracture/nondisplaced C2 fracture and right C4-cervical collar at all times. Right distal femur fracture/patella fracture with ORIF and closed treatment of patella fracture 02/16/2017 as well as right supracondylar humerus fracture with hinged elbow brace applied. Nonweightbearing right upper and right lower extremity   Cont CIR 2.  DVT Prophylaxis/Anticoagulation: Eliquis 3. Pain Management: Lidoderm patch, OxyContin sustained release 10 mg twice a day, oxycodone as needed 4. Mood: Celexa 10 mg daily   Team to provide ego support and positive reinforcement as possible 5. Neuropsych: This patient is ?fully capable of making decisions on her own behalf. 6. Skin/Wound Care: Routine skin checks 7. Fluids/Electrolytes/Nutrition: Routine  I&O's 8. Acute blood loss anemia.    Hb 11.2 on 11/22  Labs ordered for Monday   Cont to monitor 9. CAD/PAF/pacemaker 2015. Follow-up cardiology services needed. Patient is followed at [facility]. Cardiac exam 120 mg twice a day, clonidine 0.1 mg daily and 0.2 mg daily at bedtime, Lopressor 12.5 mg twice a day. Cardiac rate controlled 10. Hyperlipidemia. Crestor 11. GERD. Protonix 12. Hyponatremia   Na+ 132 on 11/22, stable  Labs ordered for Monday   Cont to monitor 13. Hypoalbuminemia   Supplement initiated 11/20 14. Leukocytosis   WBCs 13.6 on 11/22, improving  Labs ordered for Monday   Cont to monitor   Afebrile   UA unremarkable 15. HTN   Cont HCTZ   Will consider adjustment in accordance with Na+   Controlled on 11/23   LOS (Days) 5 A FACE TO FACE EVALUATION WAS PERFORMED  Lavar Rosenzweig T 02/28/2017 12:03 PM

## 2017-02-28 NOTE — Progress Notes (Signed)
Occupational Therapy Session Note  Patient Details  Name: Karina [name] MRN: [id number] Date of Birth: 1922-08-03  Today's Date: 02/28/2017 OT Individual Time: 1405-1505 OT Individual Time Calculation (min): 60 min    Short Term Goals: Week 1:  OT Short Term Goal 1 (Week 1): Pt will transition from supine to sit with mod assist in preparation for selfcare tasks.  OT Short Term Goal 2 (Week 1): Pt will complete UB bathing with mod assist sitting EOB.   OT Short Term Goal 3 (Week 1): Pt will donn pullover shirt with mod assist.   OT Short Term Goal 4 (Week 1): Pt will peform LB bathing supine to sit with mod assist.  OT Short Term Goal 5 (Week 1): Pt will perform sliding board transfer from wheelchair to drop arm commode with max assist.   Skilled Therapeutic Interventions/Progress Updates:    Pt worked on transitioning from supine to sit EOB with max assist.  Emphasis on pushing up from the EOB with the LUE to assist with sidelying to sit transition.  Karina [name] then completed transfer to the wheelchair via sliding board with total assist +2 (pt 25%).  Took pt to the therapy gym where Karina [name] transferred to the therapy mat at the same level.  Once on the mat had pt work on forward trunk flexion to the left to assist with scooting up and down the mat.  Max assist for scooting to the left with mod assist to the right.  Transferred back to the wheelchair with total +2 again in order to return back to the room.  Pt left in wheelchair at bedside with call button and phone in reach and nursing present.    Therapy Documentation Precautions:  Precautions Precautions: Fall, Cervical Precaution Comments: ROM as tolerated right knee, NWB R Knee Required Braces or Orthoses: Other Brace/Splint Cervical Brace: Hard collar, At all times Other Brace/Splint: hinged elbow brace for the right arm Restrictions Weight Bearing Restrictions: Yes RUE Weight Bearing: Non weight bearing RLE Weight Bearing: Non weight  bearing LLE Weight Bearing: Weight bearing as tolerated Other Position/Activity Restrictions: Sling on RUE at all times  Pain: Pain Assessment Pain Assessment: Faces Faces Pain Scale: Hurts a little bit Pain Type: Surgical pain Pain Location: Leg Pain Orientation: Right Pain Descriptors / Indicators: Discomfort Pain Onset: With Activity Pain Intervention(s): Repositioned ADL: See Function Navigator for Current Functional Status.   Therapy/Group: Individual Therapy  Asani Deniston OTR/L 02/28/2017, 3:25 PM

## 2017-02-28 NOTE — Progress Notes (Signed)
Physical Therapy Session Note  Patient Details  Name: Karina [name] MRN: [id number] Date of Birth: [birth year]  Today's Date: 02/28/2017 PT Individual Time: 1100-1200 PT Individual Time Calculation (min): 60 min   Short Term Goals: Week 1:  PT Short Term Goal 1 (Week 1): Pt will perform all bed mobility with mod assist PT Short Term Goal 2 (Week 1): Pt will perform bed<>chair transfers with mod assist PT Short Term Goal 3 (Week 1): Pt will propel w/c with supervision x 150 ft  Skilled Therapeutic Interventions/Progress Updates:   Pt in w/c upon arrival and agreeable to therapy, no c/o pain at rest. Pt requesting to toilet, worked on functional mobility w/ toileting. Transferred to bedside commode w/ Total A via slide board transfer, 2nd helper stand by for safety. Worked on static sitting balance and postural strength while sitting on commode, pt required unilateral HHA support to maintain upright w/ supervision. Occasionally leaned back for rest every few minutes. Attempted 1 stand in maximove for pericare, however pt unable to maintain standing for more than 5-10 seconds 2/2 fatigue. Transferred to EOB using Maximove, Total +2 for RLE management during transfer. Verbal and manual cues throughout all OOB mobility to maintain NWB RLE precautions. Transferred to supine from EOB, Total A +2 for RLE management. Pt performed 4-5 rolls to each side w/ Mod-Max A for pericare and changing of linens. Verbal, manual, and tactile cues for technique each time. Assisted pt in adjusting position in bed for comfort and prevention of skin breakdown. Ended session sitting upright in supine while eating lunch, call bell within reach and all needs met.   Therapy Documentation Precautions:  Precautions Precautions: Fall, Cervical Precaution Comments: ROM as tolerated right knee, NWB R Knee Required Braces or Orthoses: Other Brace/Splint Cervical Brace: Hard collar, At all times Other Brace/Splint: hinged  elbow brace for the right arm Restrictions Weight Bearing Restrictions: Yes RUE Weight Bearing: Non weight bearing RLE Weight Bearing: Non weight bearing LLE Weight Bearing: Weight bearing as tolerated Other Position/Activity Restrictions: Sling on RUE at all times Pain: Pain Assessment Pain Assessment: Faces Faces Pain Scale: Hurts a little bit Pain Type: Surgical pain Pain Location: Leg Pain Orientation: Right Pain Descriptors / Indicators: Discomfort Pain Onset: With Activity Pain Intervention(s): Repositioned;Emotional support  See Function Navigator for Current Functional Status.   Therapy/Group: Individual Therapy  Wilmary Levit K Arnette 02/28/2017, 12:44 PM

## 2017-03-01 ENCOUNTER — Inpatient Hospital Stay (HOSPITAL_COMMUNITY): Payer: Medicare Other | Admitting: Physical Therapy

## 2017-03-01 ENCOUNTER — Inpatient Hospital Stay (HOSPITAL_COMMUNITY): Payer: Medicare Other | Admitting: Occupational Therapy

## 2017-03-01 NOTE — Progress Notes (Signed)
At 2100, incontinent small stool, then continent of large loose stool and void. Abdomen distended. PRN oxy IR given at [place] for complaint of RLE pain. At 0600 unable to void after several attempts, bladder scan=700,    I & O cath=800cc's. Karina [name]

## 2017-03-01 NOTE — Progress Notes (Signed)
Physical Therapy Session Note  Patient Details  Name: Karina [name] MRN: [id number] Date of Birth: 1922-08-04  Today's Date: 03/01/2017 PT Individual Time: 1000-1100 PT Individual Time Calculation (min): 60 min   Short Term Goals: Week 1:  PT Short Term Goal 1 (Week 1): Pt will perform all bed mobility with mod assist PT Short Term Goal 2 (Week 1): Pt will perform bed<>chair transfers with mod assist PT Short Term Goal 3 (Week 1): Pt will propel w/c with supervision x 150 ft  Skilled Therapeutic Interventions/Progress Updates:   Pt in w/c upon arrival and agreeable to therapy, no c/o pain. Worked on functional mobility first half of session. Total A w/c transport to gym and worked on sit<>stands at high/low table. Max A overall to boost into stance, 2nd helper for RLE management to maintain NWB. Used LUE support on arm rest and then table, Max manual facilitation for hip and knee extension in stance and to maintain static standing. Pt able to keep weight on LLE only w/ verbal and tactile cues. Performed 3 stands in total w/ seated rest in between 2/2 fatigue and increased R knee pain w/ mobility. Worked on RLE strengthening and knee ROM remainder of session seated in w/c including: SAQs 2x10, knee marches 1x10, quad sets 2x10, resisted abduction and adduction 2x10, and passive knee flexion (quad) stretch to ~70 deg w/o significant increase in pain. Returned to room in w/c and self-propelled using L hemi technique w/ Max A to work on independence w/ mobility. Transferred to EOB via slide board transfer Total A as pt was requesting to toilet. Transferred to supine and assisted to place bed pan. Ended session in supine on bed pan and in agreement to alert RN when ready to get off, RN made aware of status. Call bell within reach and all needs met.   Therapy Documentation Precautions:  Precautions Precautions: Fall, Cervical Precaution Comments: ROM as tolerated right knee, NWB R Knee Required  Braces or Orthoses: Other Brace/Splint Cervical Brace: Hard collar, At all times Other Brace/Splint: hinged elbow brace for the right arm as well as hinged splint for left knee Restrictions Weight Bearing Restrictions: Yes RUE Weight Bearing: Non weight bearing RLE Weight Bearing: Non weight bearing LLE Weight Bearing: Weight bearing as tolerated Other Position/Activity Restrictions:   Vital Signs: Therapy Vitals Pulse Rate: 86 BP: 125/73 Pain: Pain Assessment Pain Assessment: No/denies pain  See Function Navigator for Current Functional Status.   Therapy/Group: Individual Therapy  Carlyle Achenbach K Arnette 03/01/2017, 12:24 PM

## 2017-03-01 NOTE — Progress Notes (Signed)
Occupational Therapy Session Note  Patient Details  Name: Karina [name] MRN: [id number] Date of Birth: December 25, 1922  Today's Date: 03/01/2017 OT Individual Time: 0901-1001 OT Individual Time Calculation (min): 60 min    Short Term Goals: Week 1:  OT Short Term Goal 1 (Week 1): Pt will transition from supine to sit with mod assist in preparation for selfcare tasks.  OT Short Term Goal 2 (Week 1): Pt will complete UB bathing with mod assist sitting EOB.   OT Short Term Goal 3 (Week 1): Pt will donn pullover shirt with mod assist.   OT Short Term Goal 4 (Week 1): Pt will peform LB bathing supine to sit with mod assist.  OT Short Term Goal 5 (Week 1): Pt will perform sliding board transfer from wheelchair to drop arm commode with max assist.   Skilled Therapeutic Interventions/Progress Updates:    Pt worked on bathing and dressing from supine to sit EOB during session.  Mod assist for rolling side to side in order for therapist to assist with washing peri area, buttocks, and donn new brief.  Removed hinged knee brace in bed in order to complete washing of lower legs as well.  Once LB bathing was completed, had pt transition to sitting from supine with max assist to work on UB bathing and dressing.  Therapist assisted with removal of hinged elbow brace so pt could complete washing RUE.  No AROM allowed in the right shoulder at this time.  Therapist assisted with washing the right arm and donning button up shirt.  Therapist assisted next with donning hinged elbow brace over the RUE.  Next pants were donned with total assist, using lateral leans side to side to pull them over the hips.  Therapist then donned hinged knee brace before completing sliding board transfer to the wheelchair.  Pt left with call button in reach and talking to a friend on the phone.    Therapy Documentation Precautions:  Precautions Precautions: Fall, Cervical Precaution Comments: ROM as tolerated right knee, NWB R  Knee Required Braces or Orthoses: Other Brace/Splint Cervical Brace: Hard collar, At all times Other Brace/Splint: hinged elbow brace for the right arm as well as hinged splint for left knee Restrictions Weight Bearing Restrictions: Yes RUE Weight Bearing: Non weight bearing RLE Weight Bearing: Non weight bearing LLE Weight Bearing: Weight bearing as tolerated Other Position/Activity Restrictions:    Pain: Pain Assessment Pain Assessment: No/denies pain ADL: See Function Navigator for Current Functional Status.   Therapy/Group: Individual Therapy  Naziya Hegwood OTR/L 03/01/2017, 12:22 PM

## 2017-03-01 NOTE — Progress Notes (Signed)
Occupational Therapy Session Note  Patient Details  Name: Karina [name] MRN: [id number] Date of Birth: [birth year]  Today's Date: 03/01/2017 OT Individual Time: 5681-2751 OT Individual Time Calculation (min): 60 min    Short Term Goals: Week 1:  OT Short Term Goal 1 (Week 1): Pt will transition from supine to sit with mod assist in preparation for selfcare tasks.  OT Short Term Goal 2 (Week 1): Pt will complete UB bathing with mod assist sitting EOB.   OT Short Term Goal 3 (Week 1): Pt will donn pullover shirt with mod assist.   OT Short Term Goal 4 (Week 1): Pt will peform LB bathing supine to sit with mod assist.  OT Short Term Goal 5 (Week 1): Pt will perform sliding board transfer from wheelchair to drop arm commode with max assist.   Skilled Therapeutic Interventions/Progress Updates:    Pt worked on toilet transfers using the sliding board to drop arm commode with total assist +2 (pt 25%).  Once on the toilet she needed total +2 (pt 10%) for clothing management with lateral leans side to side.  Had pt come down on her left forearm on the EOB since commode was positioned next to it, in order for therapist to assist with clothing management.  Pt with bowel incontinence in brief as well, noted when removing it.  She was able to void and have a BM once on the toilet with increased time.  Therapist had to remove left hinged knee brace in order to remove and re-donn new brief.  Pt unable to tolerate lateral weightshifts to the left for donning new brief so therapist and tech completed squat pivot transfer without sliding board to the bed.  Total +2 (pt 10%).  Total +2 (pt 20%) needed for transition from sitting to supine.  Completed rolling in bed with mod assist in order to pull new brief over hips.  Pt left in supine with HOB elevated and call button and phone in reach.    Therapy Documentation Precautions:  Precautions Precautions: Fall, Cervical Precaution Comments: ROM as tolerated  right knee, NWB R Knee Required Braces or Orthoses: Other Brace/Splint Cervical Brace: Hard collar, At all times Other Brace/Splint: hinged elbow brace for the right arm as well as hinged splint for left knee Restrictions Weight Bearing Restrictions: Yes RUE Weight Bearing: Non weight bearing RLE Weight Bearing: Non weight bearing LLE Weight Bearing: Weight bearing as tolerated Other Position/Activity Restrictions:    Vital Signs: Therapy Vitals Temp: 99.9 F (37.7 C) Temp Source: Oral Pulse Rate: 87 Resp: 16 BP: 117/64 Oxygen Therapy SpO2: 90 % O2 Device: Not Delivered Pain: Pain Assessment Pain Assessment: Faces Faces Pain Scale: Hurts a little bit Pain Type: Surgical pain Pain Location: Knee Pain Orientation: Right Pain Onset: With Activity Pain Intervention(s): Repositioned ADL: See Function Navigator for Current Functional Status.   Therapy/Group: Individual Therapy  Angelo Prindle OTR/L 03/01/2017, 4:18 PM

## 2017-03-01 NOTE — Progress Notes (Signed)
[facility]     PROGRESS NOTE  Subjective/Complaints:  Patient with some bowel incontinence overnight.  Had to be catheterized for urine as well.  Intermittent complaints of pain  ROS: pt denies nausea, vomiting, diarrhea, cough, shortness of breath or chest pain   Objective: Vital Signs: Blood pressure 125/73, pulse 86, temperature 98.1 F (36.7 C), temperature source Oral, resp. rate 16, height 5\' 1"  (1.549 m), weight 57 kg (125 lb 10.6 oz), SpO2 95 %. No results found. No results for input(s): WBC, HGB, HCT, PLT in the last 72 hours. No results for input(s): NA, K, CL, GLUCOSE, BUN, CREATININE, CALCIUM in the last 72 hours.  Invalid input(s): CO CBG (last 3)  No results for input(s): GLUCAP in the last 72 hours.  Wt Readings from Last 3 Encounters:  02/25/17 57 kg (125 lb 10.6 oz)  02/15/17 59.9 kg (132 lb)  11/18/16 56.7 kg (125 lb)    Physical Exam:  BP 125/73   Pulse 86   Temp 98.1 F (36.7 C) (Oral)   Resp 16   Ht 5\' 1"  (1.549 m)   Wt 57 kg (125 lb 10.6 oz)   SpO2 95%   BMI 23.74 kg/m  Constitutional: She appears well-developed and well-nourished. NAD. HENT: Normocephalic and atraumatic.  Eyes: EOM are normal. No discharge.  Neck: C-collar in place  Cardiovascular:RRR without murmur. No JVD . Respiratory: CTA Bilaterally without wheezes or rales. Normal effort .  GI: Bowel sounds are normal. Non distended. Musculoskeletal: She exhibits edema and tenderness.  Neurological: She is alert.  Motor: RUE: limited by sling, finger flexors 2/5 LUE: 5/5 proximal to distal RLE: 2-/5 proximal to distal (stable LLE: HF 2/5, KE 2/5, ADF/PF 2-/5 (stable  ) HOH  Skin: Skin is warm and dry. Dressings and incision c/d/i  Psychiatric: Remains anxious  Assessment/Plan: 1. Functional deficits secondary to polytrauma which require 3+ hours per day of interdisciplinary therapy in a comprehensive inpatient rehab setting. Physiatrist is  providing close team supervision and 24 hour management of active medical problems listed below. Physiatrist and rehab team continue to assess barriers to discharge/monitor patient progress toward functional and medical goals.  Function:  Bathing Bathing position   Position: Bed  Bathing parts Body parts bathed by patient: Right arm, Chest, Abdomen, Front perineal area, Right upper leg, Left upper leg Body parts bathed by helper: Front perineal area, Buttocks, Right upper leg, Left upper leg, Right lower leg(did not have time to complete UB during session.  NT to assist)  Bathing assist Assist Level: 2 helpers      Upper Body Dressing/Undressing Upper body dressing   What is the patient wearing?: Button up shirt           Button up shirt - Perfomed by helper: Thread/unthread right sleeve, Thread/unthread left sleeve, Pull shirt around back, Button/unbutton shirt    Upper body assist Assist Level: 2 helpers      Lower Body Dressing/Undressing Lower body dressing   What is the patient wearing?: Pants, Ted Hose, Non-skid slipper socks       Pants- Performed by helper: Thread/unthread right pants leg, Thread/unthread left pants leg, Pull pants up/down   Non-skid slipper socks- Performed by helper: Don/doff right sock, Don/doff left sock               TED Hose - Performed by helper: Don/doff right TED hose, Don/doff left TED hose  Lower body assist Assist for lower body dressing: 2 Helpers  Toileting Toileting Toileting activity did not occur: Safety/medical concerns   Toileting steps completed by helper: Adjust clothing prior to toileting, Performs perineal hygiene, Adjust clothing after toileting Toileting Assistive Devices: Other (comment)(purwick catheter in place)  Toileting assist Assist level: Two helpers   Transfers Chair/bed transfer Chair/bed transfer activity did not occur: N/A Chair/bed transfer method: Lateral scoot Chair/bed transfer assist level:  Total assist (Pt < 25%) Chair/bed transfer assistive device: Mechanical lift, Sliding board Mechanical lift: Maximove   Locomotion Ambulation Ambulation activity did not occur: Safety/medical concerns(NWB R LE/R UE)         Wheelchair Wheelchair activity did not occur: Refused(pt requesting to stay in bed secondary to painful disimpaction prior to therapy ) Type: Manual Max wheelchair distance: 150 ft Assist Level: Moderate assistance (Pt 50 - 74%)  Cognition Comprehension Comprehension assist level: Follows basic conversation/direction with extra time/assistive device  Expression Expression assist level: Expresses basic needs/ideas: With extra time/assistive device  Social Interaction Social Interaction assist level: Interacts appropriately 90% of the time - Needs monitoring or encouragement for participation or interaction.  Problem Solving Problem solving assist level: Solves basic 90% of the time/requires cueing < 10% of the time  Memory Memory assist level: Recognizes or recalls 75 - 89% of the time/requires cueing 10 - 24% of the time    Medical Problem List and Plan: 1.  Decreased functional mobility secondary to nondisplaced type II odontoid fracture/nondisplaced C2 fracture and right C4-cervical collar at all times. Right distal femur fracture/patella fracture with ORIF and closed treatment of patella fracture 02/16/2017 as well as right supracondylar humerus fracture with hinged elbow brace applied. Nonweightbearing right upper and right lower extremity   Cont CIR therapies 2.  DVT Prophylaxis/Anticoagulation: Eliquis 3. Pain Management: Lidoderm patch, OxyContin sustained release 10 mg twice a day, oxycodone as needed 4. Mood: Celexa 10 mg daily   Team to provide ego support and positive reinforcement as possible 5. Neuropsych: This patient is ?fully capable of making decisions on her own behalf. 6. Skin/Wound Care: Routine skin checks 7. Fluids/Electrolytes/Nutrition:  Routine I&O's 8. Acute blood loss anemia.    Hb 11.2 on 11/22  Labs ordered for Monday   Cont to monitor 9. CAD/PAF/pacemaker 2015. Follow-up cardiology services needed. Patient is followed at [facility]. Cardiac exam 120 mg twice a day, clonidine 0.1 mg daily and 0.2 mg daily at bedtime, Lopressor 12.5 mg twice a day. Cardiac rate controlled 10. Hyperlipidemia. Crestor 11. GERD. Protonix 12. Hyponatremia   Na+ 132 on 11/22, stable  Labs ordered for Monday   Cont to monitor 13. Hypoalbuminemia   Supplement initiated 11/20 14. Leukocytosis   WBCs 13.6 on 11/22, improving  Labs ordered for Monday   Cont to monitor   Afebrile   UA unremarkable 15. HTN   Cont HCTZ   Will consider adjustment in accordance with Na+   Controlled on 11/25  16. Bowel incontinence, urine retention: oob to void, toilet when possible   LOS (Days) 6 A FACE TO FACE EVALUATION WAS PERFORMED  SWARTZ,ZACHARY T 03/01/2017 10:16 AM

## 2017-03-02 ENCOUNTER — Inpatient Hospital Stay (HOSPITAL_COMMUNITY): Payer: Medicare Other

## 2017-03-02 ENCOUNTER — Inpatient Hospital Stay (HOSPITAL_COMMUNITY): Payer: Medicare Other | Admitting: Occupational Therapy

## 2017-03-02 ENCOUNTER — Inpatient Hospital Stay (HOSPITAL_COMMUNITY): Payer: Medicare Other | Admitting: Speech Pathology

## 2017-03-02 LAB — BASIC METABOLIC PANEL
Anion gap: 11 (ref 5–15)
BUN: 17 mg/dL (ref 6–20)
CHLORIDE: 86 mmol/L — AB (ref 101–111)
CO2: 31 mmol/L (ref 22–32)
Calcium: 8.9 mg/dL (ref 8.9–10.3)
Creatinine, Ser: 0.9 mg/dL (ref 0.44–1.00)
GFR calc non Af Amer: 54 mL/min — ABNORMAL LOW (ref >60)
GLUCOSE: 121 mg/dL — AB (ref 65–99)
Potassium: 2.9 mmol/L — ABNORMAL LOW (ref 3.5–5.1)
Sodium: 128 mmol/L — ABNORMAL LOW (ref 135–145)

## 2017-03-02 LAB — CBC WITH DIFFERENTIAL/PLATELET
BASOS ABS: 0 10*3/uL (ref 0.0–0.1)
BASOS PCT: 0 %
EOS PCT: 1 %
Eosinophils Absolute: 0.2 10*3/uL (ref 0.0–0.7)
HEMATOCRIT: 35.5 % — AB (ref 36.0–46.0)
HEMOGLOBIN: 11.7 g/dL — AB (ref 12.0–15.0)
LYMPHS PCT: 10 %
Lymphs Abs: 1.5 10*3/uL (ref 0.7–4.0)
MCH: 30.5 pg (ref 26.0–34.0)
MCHC: 33 g/dL (ref 30.0–36.0)
MCV: 92.7 fL (ref 78.0–100.0)
MONOS PCT: 6 %
Monocytes Absolute: 0.9 10*3/uL (ref 0.1–1.0)
Neutro Abs: 12.5 10*3/uL — ABNORMAL HIGH (ref 1.7–7.7)
Neutrophils Relative %: 83 %
Platelets: 403 10*3/uL — ABNORMAL HIGH (ref 150–400)
RBC: 3.83 MIL/uL — ABNORMAL LOW (ref 3.87–5.11)
RDW: 14.6 % (ref 11.5–15.5)
WBC: 15.1 10*3/uL — ABNORMAL HIGH (ref 4.0–10.5)

## 2017-03-02 NOTE — Plan of Care (Signed)
  Not Progressing RH BLADDER ELIMINATION RH STG MANAGE BLADDER WITH ASSISTANCE Description STG Manage Bladder With min Assistance Requiring intermittent I&O caths.  03/02/2017 7841 - Not Progressing by Cornell Barman, RN RH SKIN INTEGRITY RH STG ABLE TO PERFORM INCISION/WOUND CARE W/ASSISTANCE Description STG Able To Perform Incision/Wound Care With max Assistance.  Total assist with incision and wound care. 03/02/2017 2820 - Not Progressing by Cornell Barman, RN

## 2017-03-02 NOTE — Progress Notes (Signed)
Large void at 0030. This Am bladder scan=200. Slept good last night. Taking Oxy ir PRN for pain. Karina [name] A

## 2017-03-02 NOTE — Progress Notes (Signed)
Occupational Therapy Session Note  Patient Details  Name: Karina [name] MRN: [id number] Date of Birth: 07/21/1922  Today's Date: 03/02/2017 OT Individual Time: 0705-0800 OT Individual Time Calculation (min): 55 min    Short Term Goals: Week 1:  OT Short Term Goal 1 (Week 1): Pt will transition from supine to sit with mod assist in preparation for selfcare tasks.  OT Short Term Goal 2 (Week 1): Pt will complete UB bathing with mod assist sitting EOB.   OT Short Term Goal 3 (Week 1): Pt will donn pullover shirt with mod assist.   OT Short Term Goal 4 (Week 1): Pt will peform LB bathing supine to sit with mod assist.  OT Short Term Goal 5 (Week 1): Pt will perform sliding board transfer from wheelchair to drop arm commode with max assist.   Skilled Therapeutic Interventions/Progress Updates:    Upon entering the room, pt with various c/o pain management, feeling unwell, and upset over current condition. Pt stating, " Will I ever get better from this?" OT providing therapeutic use of self . OT discussed pt's current goals, POC, and OT purpose. Pt agreeable and needing to be redirected to functional task. Pt rolling with mod A for LB clothing management. Pt able to wash peri area with therapist washing buttocks and B LE's this session. Pt set up for breakfast at the end of session. Call bell and all needed items within reach upon exiting the room. Bed alarm activated.   Therapy Documentation Precautions:  Precautions Precautions: Fall, Cervical Precaution Comments: ROM as tolerated right knee, NWB R Knee Required Braces or Orthoses: Other Brace/Splint Cervical Brace: Hard collar, At all times Other Brace/Splint: hinged elbow brace for the right arm as well as hinged splint for left knee Restrictions Weight Bearing Restrictions: Yes RUE Weight Bearing: Non weight bearing RLE Weight Bearing: Non weight bearing LLE Weight Bearing: Weight bearing as tolerated Other Position/Activity  Restrictions:   General:   Vital Signs: Therapy Vitals Temp: 99.2 F (37.3 C) Temp Source: Oral Pulse Rate: 83 Resp: 16 BP: 124/78 Patient Position (if appropriate): Sitting Oxygen Therapy SpO2: 93 % O2 Device: Not Delivered Pain:   ADL:   Vision   Perception    Praxis   Exercises:   Other Treatments:    See Function Navigator for Current Functional Status.   Therapy/Group: Individual Therapy  Gypsy Decant 03/02/2017, 4:43 PM

## 2017-03-02 NOTE — Progress Notes (Signed)
Physical Therapy Session Note  Patient Details  Name: Karina [name] MRN: [id number] Date of Birth: 10/23/1922  Today's Date: 03/02/2017 PT Individual Time: 1000-1100, 1300-1330 PT Individual Time Calculation (min): 60 min , 30 min  Short Term Goals: Week 1:  PT Short Term Goal 1 (Week 1): Pt will perform all bed mobility with mod assist PT Short Term Goal 2 (Week 1): Pt will perform bed<>chair transfers with mod assist PT Short Term Goal 3 (Week 1): Pt will propel w/c with supervision x 150 ft  Skilled Therapeutic Interventions/Progress Updates:    Session 1: Pt supine in bed upon PT arrival, agreeable to therapy tx and reports 7/10 pain in R LE with movements. Pt reported incontinence of bladder. Pt performed rolling in each direction x 2 with mod assist, verbal cues for techniques, in order to get cleaned and don clean brief. Therapist noted some redness around buttocks area and discussed the importance of pressure relief with the pt, trying to change positions in bed as much as possible. Pt transferred from supine>sitting EOB with max assist, verbal cues for technique. Pt transferred from bed>w/c with mod assist using slideboard, pt able to scoot herself using L UE/L LE while going towards the L and at more of a decline level. Pt transported to gym in w/c total assist. Pt performed slideboard transfer lateral scooting with max assist from w/c<>mat, emphasis on proper weightshifting and pushing through L LE. Therapist exchanged w/c seat cushion in order to promote better pressure relief in sitting. Worked on lateral weightshifting in sitting for scooting back in w/c. Pt left seated in w/c at end of session with needs in reach.   Session 2: Pt supine in bed upon PT arrival, agreeable to therapy tx and reports minimal pain at rest, 7/10 pain in R LE with exercises. Session focused on bed level therex for LE ROM and strengthening. Pt performed 2 x 10 active assisted exercises of each: heel slides,  hip abduction and SLR with R LE. Pt performed 2 x 10 of each with L LE: SLRs, partial single leg bridges, and heel slides. Therapist performed hamstring and heel cord stretches on L LE. Therapist performed knee flexion stretch on R LE 2 x 30 sec for ROM. Pt transferred supine>Lsidelying with mod assist, pt left sidelying for pressure relief. Needs in reach and granddaughter present.   Therapy Documentation Precautions:  Precautions Precautions: Fall, Cervical Precaution Comments: ROM as tolerated right knee, NWB R Knee Required Braces or Orthoses: Other Brace/Splint Cervical Brace: Hard collar, At all times Other Brace/Splint: hinged elbow brace for the right arm as well as hinged splint for left knee Restrictions Weight Bearing Restrictions: Yes RUE Weight Bearing: Non weight bearing RLE Weight Bearing: Non weight bearing LLE Weight Bearing: Weight bearing as tolerated Other Position/Activity Restrictions:     See Function Navigator for Current Functional Status.   Therapy/Group: Individual Therapy  Netta Corrigan, PT, DPT 03/02/2017, 7:48 AM

## 2017-03-02 NOTE — Progress Notes (Signed)
Speech Language Pathology Daily Session Note  Patient Details  Name: Karina [name] MRN: [id number] Date of Birth: 05/15/1922  Today's Date: 03/02/2017 SLP Individual Time: 0820-0920 SLP Individual Time Calculation (min): 60 min  Short Term Goals: Week 1: SLP Short Term Goal 1 (Week 1): Pt will recall complex, new information with supevision cues for use of compensatory strategies. SLP Short Term Goal 2 (Week 1): Pt will complete semi-complex tasks with supervision cues for functional problem solving.   SLP Short Term Goal 3 (Week 1): Pt will selectively attend to tasks in a moderately distracting environment for 30 minutes with supervision verbal cues for redirection.    Skilled Therapeutic Interventions: Skilled treatment session focused on cognitive goals. SLP facilitated session by providing Min A verbal cues for recall of her medications and their functions and supervision verbal cues for problem solving during organization of a BID pill box. Patient demonstrated selective attention to task for ~30 minutes with supervision verbal cues for redirection. Patient left upright in bed with friend present. Continue with current plan of care.      Function:  Cognition Comprehension Comprehension assist level: Follows basic conversation/direction with extra time/assistive device  Expression   Expression assist level: Expresses basic needs/ideas: With extra time/assistive device  Social Interaction Social Interaction assist level: Interacts appropriately 90% of the time - Needs monitoring or encouragement for participation or interaction.  Problem Solving Problem solving assist level: Solves basic 75 - 89% of the time/requires cueing 10 - 24% of the time  Memory Memory assist level: Recognizes or recalls 75 - 89% of the time/requires cueing 10 - 24% of the time    Pain Pain Assessment Pain Assessment: 0-10 Pain Score: 6  Pain Type: Acute pain Pain Location: Neck Pain Orientation:  Right Pain Descriptors / Indicators: Aching Pain Frequency: Intermittent Pain Onset: On-going Patients Stated Pain Goal: 3 Pain Intervention(s): Medication (See eMAR);Repositioned Multiple Pain Sites: No  Therapy/Group: Individual Therapy  Jakyiah Briones 03/02/2017, 10:24 AM

## 2017-03-02 NOTE — Progress Notes (Signed)
[facility]     PROGRESS NOTE  Subjective/Complaints:  Pt seen laying in bed this AM, working with OT.  She slept fairly overnight.  She states she had a "horrible" weekend.   ROS: Denies nausea, vomiting, diarrhea, shortness of breath or chest pain   Objective: Vital Signs: Blood pressure 129/69, pulse 75, temperature 98.3 F (36.8 C), temperature source Oral, resp. rate 16, height 5\' 1"  (1.549 m), weight 57 kg (125 lb 10.6 oz), SpO2 93 %. No results found. No results for input(s): WBC, HGB, HCT, PLT in the last 72 hours. No results for input(s): NA, K, CL, GLUCOSE, BUN, CREATININE, CALCIUM in the last 72 hours.  Invalid input(s): CO CBG (last 3)  No results for input(s): GLUCAP in the last 72 hours.  Wt Readings from Last 3 Encounters:  02/25/17 57 kg (125 lb 10.6 oz)  02/15/17 59.9 kg (132 lb)  11/18/16 56.7 kg (125 lb)    Physical Exam:  BP 129/69 (BP Location: Left Arm)   Pulse 75   Temp 98.3 F (36.8 C) (Oral)   Resp 16   Ht 5\' 1"  (1.549 m)   Wt 57 kg (125 lb 10.6 oz)   SpO2 93%   BMI 23.74 kg/m  Constitutional: She appears well-developed and well-nourished. NAD. HENT: Normocephalic and atraumatic.  Eyes: EOM are normal. No discharge.  Neck: +C-collar in place  Cardiovascular: RRR without murmur. No JVD. Respiratory: CTA Bilaterally. Normal effort .  GI: Bowel sounds are normal. Non distended. Musculoskeletal: She exhibits edema and tenderness.  Neurological: She is alert.  Motor: RUE: limited by sling, finger flexors 2/5 LUE: 5/5 proximal to distal RLE: 2-/5 proximal to distal (stable) LLE: HF 2/5, KE 2/5, ADF/PF 2-/5 (stable) HOH  Skin: Skin is warm and dry. Dressings and incision c/d/i  Psychiatric: Flat.   Assessment/Plan: 1. Functional deficits secondary to polytrauma which require 3+ hours per day of interdisciplinary therapy in a comprehensive inpatient rehab setting. Physiatrist is providing close team  supervision and 24 hour management of active medical problems listed below. Physiatrist and rehab team continue to assess barriers to discharge/monitor patient progress toward functional and medical goals.  Function:  Bathing Bathing position   Position: Sitting EOB  Bathing parts Body parts bathed by patient: Right arm, Chest, Abdomen Body parts bathed by helper: Buttocks, Front perineal area, Right upper leg, Left upper leg, Right lower leg, Left lower leg, Back  Bathing assist Assist Level: 2 helpers      Upper Body Dressing/Undressing Upper body dressing   What is the patient wearing?: Button up shirt         Button up shirt - Perfomed by patient: Thread/unthread left sleeve Button up shirt - Perfomed by helper: Thread/unthread right sleeve, Pull shirt around back, Button/unbutton shirt    Upper body assist Assist Level: 2 helpers      Lower Body Dressing/Undressing Lower body dressing   What is the patient wearing?: Pants, Non-skid slipper socks, Ted Hose       Pants- Performed by helper: Thread/unthread right pants leg, Thread/unthread left pants leg, Pull pants up/down   Non-skid slipper socks- Performed by helper: Don/doff right sock, Don/doff left sock               TED Hose - Performed by helper: Don/doff right TED hose, Don/doff left TED hose  Lower body assist Assist for lower body dressing: 2 Helpers      Toileting Toileting Toileting activity did not  occur: Safety/medical concerns   Toileting steps completed by helper: Adjust clothing prior to toileting, Performs perineal hygiene, Adjust clothing after toileting Toileting Assistive Devices: Other (comment)(purwick catheter in place)  Toileting assist Assist level: Two helpers   Transfers Chair/bed transfer Chair/bed transfer activity did not occur: N/A Chair/bed transfer method: Lateral scoot Chair/bed transfer assist level: Total assist (Pt < 25%) Chair/bed transfer assistive device: Sliding  board Mechanical lift: Maximove   Locomotion Ambulation Ambulation activity did not occur: Safety/medical concerns(NWB R LE/R UE)         Wheelchair Wheelchair activity did not occur: Refused(pt requesting to stay in bed secondary to painful disimpaction prior to therapy ) Type: Manual Max wheelchair distance: 100' Assist Level: Maximal assistance (Pt 25 - 49%)  Cognition Comprehension Comprehension assist level: Follows basic conversation/direction with extra time/assistive device  Expression Expression assist level: Expresses basic needs/ideas: With extra time/assistive device  Social Interaction Social Interaction assist level: Interacts appropriately 90% of the time - Needs monitoring or encouragement for participation or interaction.  Problem Solving Problem solving assist level: Solves basic 75 - 89% of the time/requires cueing 10 - 24% of the time  Memory Memory assist level: Recognizes or recalls 75 - 89% of the time/requires cueing 10 - 24% of the time    Medical Problem List and Plan: 1.  Decreased functional mobility secondary to nondisplaced type II odontoid fracture/nondisplaced C2 fracture and right C4-cervical collar at all times. Right distal femur fracture/patella fracture with ORIF and closed treatment of patella fracture 02/16/2017 as well as right supracondylar humerus fracture with hinged elbow brace applied. Nonweightbearing right upper and right lower extremity   Cont CIR therapies 2.  DVT Prophylaxis/Anticoagulation: Eliquis 3. Pain Management: Lidoderm patch, OxyContin sustained release 10 mg twice a day, oxycodone as needed 4. Mood: Celexa 10 mg daily   Team to provide ego support and positive reinforcement as possible 5. Neuropsych: This patient is ?fully capable of making decisions on her own behalf. 6. Skin/Wound Care: Routine skin checks 7. Fluids/Electrolytes/Nutrition: Routine I&O's 8. Acute blood loss anemia.    Hb 11.2 on 11/22  Labs pending   Cont  to monitor 9. CAD/PAF/pacemaker 2015. Follow-up cardiology services needed. Patient is [facility] at [facility]. Cardiac exam 120 mg twice a day, clonidine 0.1 mg daily and 0.2 mg daily at bedtime, Lopressor 12.5 mg twice a day. Cardiac rate controlled 10. Hyperlipidemia. Crestor 11. GERD. Protonix 12. Hyponatremia   Na+ 132 on 11/22, stable  Labs pending   Cont to monitor 13. Hypoalbuminemia   Supplement initiated 11/20 14. Leukocytosis   WBCs 13.6 on 11/22, improving  Labs pending   Cont to monitor   Afebrile   UA unremarkable 15. HTN   Cont HCTZ   Will consider adjustment in accordance with Na+   Controlled on 11/26 16. Bowel incontinence, urine retention   oob to void, toilet when possible   LOS (Days) 7 A FACE TO FACE EVALUATION WAS PERFORMED  Amritha Yorke Lorie Phenix 03/02/2017 8:41 AM

## 2017-03-03 ENCOUNTER — Inpatient Hospital Stay (HOSPITAL_COMMUNITY): Payer: Medicare Other | Admitting: Physical Therapy

## 2017-03-03 ENCOUNTER — Inpatient Hospital Stay (HOSPITAL_COMMUNITY): Payer: Medicare Other

## 2017-03-03 ENCOUNTER — Inpatient Hospital Stay (HOSPITAL_COMMUNITY): Payer: Medicare Other | Admitting: Occupational Therapy

## 2017-03-03 DIAGNOSIS — E876 Hypokalemia: Secondary | ICD-10-CM | POA: Diagnosis present

## 2017-03-03 DIAGNOSIS — F329 Major depressive disorder, single episode, unspecified: Secondary | ICD-10-CM | POA: Diagnosis present

## 2017-03-03 MED ORDER — CITALOPRAM HYDROBROMIDE 10 MG PO TABS
10.0000 mg | ORAL_TABLET | Freq: Every day | ORAL | Status: DC
  Administered 2017-03-03 – 2017-03-12 (×10): 10 mg via ORAL
  Filled 2017-03-03 (×11): qty 1

## 2017-03-03 MED ORDER — POTASSIUM CHLORIDE CRYS ER 20 MEQ PO TBCR
40.0000 meq | EXTENDED_RELEASE_TABLET | Freq: Two times a day (BID) | ORAL | Status: AC
  Administered 2017-03-03 – 2017-03-04 (×4): 40 meq via ORAL
  Filled 2017-03-03 (×4): qty 2

## 2017-03-03 NOTE — Progress Notes (Signed)
[facility] PHYSICAL MEDICINE & REHABILITATION     PROGRESS NOTE  Subjective/Complaints:  Patient seen lying in bed this morning. She states she had a "miserable" night. Discussed with nursing, patient stating that she has ideas about dying and not wishing to live anymore. She was also noted to wet herself so that she may get attention and be around others. This AM, patient states she is more optimistic and states that things she'll be okay. She also requests a bedpan.  ROS: Denies nausea, vomiting, diarrhea, shortness of breath or chest pain   Objective: Vital Signs: Blood pressure 125/61, pulse 78, temperature 98.7 F (37.1 C), temperature source Oral, resp. rate 16, height 5\' 1"  (1.549 m), weight 57 kg (125 lb 10.6 oz), SpO2 94 %. No results found. Recent Labs    03/02/17 0815  WBC 15.1*  HGB 11.7*  HCT 35.5*  PLT 403*   Recent Labs    03/02/17 0815  NA 128*  K 2.9*  CL 86*  GLUCOSE 121*  BUN 17  CREATININE 0.90  CALCIUM 8.9   CBG (last 3)  No results for input(s): GLUCAP in the last 72 hours.  Wt Readings from Last 3 Encounters:  02/25/17 57 kg (125 lb 10.6 oz)  02/15/17 59.9 kg (132 lb)  11/18/16 56.7 kg (125 lb)    Physical Exam:  BP 125/61 (BP Location: Left Arm)   Pulse 78   Temp 98.7 F (37.1 C) (Oral)   Resp 16   Ht 5\' 1"  (1.549 m)   Wt 57 kg (125 lb 10.6 oz)   SpO2 94%   BMI 23.74 kg/m  Constitutional: She appears well-developed and well-nourished. NAD. HENT: Normocephalic and atraumatic.  Eyes: EOM are normal. No discharge.  Neck: +C-collar in place  Cardiovascular: RRR. No JVD. Respiratory: CTA Bilaterally. Normal effort. GI: Bowel sounds are normal. Non distended. Musculoskeletal: She exhibits edema and tenderness.  Neurological: She is alert.  Motor: RUE: limited by sling, finger flexors 2/5 LUE: 5/5 proximal to distal RLE: 2-/5 proximal to distal (stable) LLE: HF 2/5, KE 2/5, ADF/PF 2-/5 (unchanged) HOH  Skin: Skin is warm and dry.  Dressings and incision c/d/i  Psychiatric: Flat.   Assessment/Plan: 1. Functional deficits secondary to polytrauma which require 3+ hours per day of interdisciplinary therapy in a comprehensive inpatient rehab setting. Physiatrist is providing close team supervision and 24 hour management of active medical problems listed below. Physiatrist and rehab team continue to assess barriers to discharge/monitor patient progress toward functional and medical goals.  Function:  Bathing Bathing position   Position: Bed  Bathing parts Body parts bathed by patient: Front perineal area(LB only) Body parts bathed by helper: Buttocks, Right upper leg, Left upper leg, Right lower leg, Left lower leg, Back  Bathing assist Assist Level: (total A)      Upper Body Dressing/Undressing Upper body dressing   What is the patient wearing?: Button up shirt         Button up shirt - Perfomed by patient: Thread/unthread left sleeve Button up shirt - Perfomed by helper: Thread/unthread right sleeve, Pull shirt around back, Button/unbutton shirt    Upper body assist Assist Level: 2 helpers      Lower Body Dressing/Undressing Lower body dressing   What is the patient wearing?: Pants, Non-skid slipper socks, Ted Hose       Pants- Performed by helper: Thread/unthread right pants leg, Thread/unthread left pants leg, Pull pants up/down   Non-skid slipper socks- Performed by helper: Don/doff right  sock, Don/doff left sock               TED Hose - Performed by helper: Don/doff right TED hose, Don/doff left TED hose  Lower body assist Assist for lower body dressing: 2 Helpers      Toileting Toileting Toileting activity did not occur: Safety/medical concerns   Toileting steps completed by helper: Adjust clothing prior to toileting, Performs perineal hygiene, Adjust clothing after toileting Toileting Assistive Devices: Other (comment)(purwick catheter in place)  Toileting assist Assist level: Two  helpers   Transfers Chair/bed transfer Chair/bed transfer activity did not occur: N/A Chair/bed transfer method: Lateral scoot Chair/bed transfer assist level: Maximal assist (Pt 25 - 49%/lift and lower) Chair/bed transfer assistive device: Sliding board Mechanical lift: Maximove   Locomotion Ambulation Ambulation activity did not occur: Safety/medical concerns(NWB R LE/R UE)         Wheelchair Wheelchair activity did not occur: Refused(pt requesting to stay in bed secondary to painful disimpaction prior to therapy ) Type: Manual Max wheelchair distance: 100' Assist Level: Maximal assistance (Pt 25 - 49%)  Cognition Comprehension Comprehension assist level: Follows basic conversation/direction with extra time/assistive device  Expression Expression assist level: Expresses basic needs/ideas: With extra time/assistive device  Social Interaction Social Interaction assist level: Interacts appropriately 90% of the time - Needs monitoring or encouragement for participation or interaction.  Problem Solving Problem solving assist level: Solves basic 75 - 89% of the time/requires cueing 10 - 24% of the time  Memory Memory assist level: Recognizes or recalls 75 - 89% of the time/requires cueing 10 - 24% of the time    Medical Problem List and Plan: 1.  Decreased functional mobility secondary to nondisplaced type II odontoid fracture/nondisplaced C2 fracture and right C4-cervical collar at all times. Right distal femur fracture/patella fracture with ORIF and closed treatment of patella fracture 02/16/2017 as well as right supracondylar humerus fracture with hinged elbow brace applied. Nonweightbearing right upper and right lower extremity   Cont CIR therapies 2.  DVT Prophylaxis/Anticoagulation: Eliquis 3. Pain Management: Lidoderm patch, OxyContin sustained release 10 mg twice a day, oxycodone as needed 4. Mood: Celexa 10 mg daily   Team to provide ego support and positive reinforcement as  possible   Reactive depression at present, need to monitor closely for SI, does not appear to have actual SI at at present   Will request Neuropsych eval 5. Neuropsych: This patient is ?fully capable of making decisions on her own behalf. 6. Skin/Wound Care: Routine skin checks 7. Fluids/Electrolytes/Nutrition: Routine I&O's 8. Acute blood loss anemia.    Hb 11.7 on 11/26   Cont to monitor 9. CAD/PAF/pacemaker 2015. Follow-up cardiology services needed. Patient is followed at [facility]. Cardiac exam 120 mg twice a day, clonidine 0.1 mg daily and 0.2 mg daily at bedtime, Lopressor 12.5 mg twice a day. Cardiac rate controlled 10. Hyperlipidemia. Crestor 11. GERD. Protonix 12. Hyponatremia   Na+ 128 on 11/26, hctz d/ced on 11/27, cont to monitor   Cont to monitor 13. Hypoalbuminemia   Supplement initiated 11/20 14. Leukocytosis   WBCs 15.1 on 11/26   Cont to monitor   Afebrile   UA unremarkable 15. HTN   HCTZ d/ced on 11/27 due to abnormal electrolytes   Cont to monitor 16. Bladder incontinence   Likely functional   17. Hypokalemia   K+ 2.9 on 11/26   Supplemented x2 days   LOS (Days) 8 A FACE TO FACE EVALUATION WAS PERFORMED  Karina [name] Lorie Phenix  03/03/2017 8:54 AM

## 2017-03-03 NOTE — Progress Notes (Signed)
Pt resting in bed quietly. Easily aroused c/o abdominal pain and nausea.. Pain management administered and effective per order. pt states that "I just want to die." When asked if she spoke with the Social Worker or the provider regarding the statement made She currently denies having a plan when asked if she spoke with the provider regarding wanting to sie, pt states that she dont remember Pt then accuses this nurse of not being "sympathetic" of her enough When asked why would she say that, she was not able to provide any examples Apology given, incontinent care provided pain management administered and effective. Manipulative behavior noted this shift Pt also c/o right toe being "stubbed" and requests to have "you rub it" Pt educated that this nurse will not "rub on her toe,"  but will administer pain management for discomfort.. Oxycodone IR administered and effective Will continue to monitor.

## 2017-03-03 NOTE — Progress Notes (Signed)
Pt called son this morning at 0400 to tell him "that I am not going to make it through the night." Son arrived to facility at at 48 and left this morning at 0500 am and states, my mother only wants someone with her at all times; who do I need to speak with?" Son informed to speak with social services to obtain resources to assist with situation and he could also speak with the provider for possible depression evaluation / treatment or psych consult if deemed necessary will continue to monitor.

## 2017-03-03 NOTE — Progress Notes (Signed)
Physical Therapy Weekly Progress Note  Patient Details  Name: Karina [name] MRN: [id number] Date of Birth: 1922-12-02  Beginning of progress report period: February 24, 2017 End of progress report period: March 03, 2017  Today's Date: 03/03/2017 PT Individual Time: 1401-1500 PT Individual Time Calculation (min): 59 min   Patient has met 2 of 3 short term goals. Pt is progressing with ability to perform bed mobility, transfers and sit<>stands but continues to be limited by pain and weakness. Pt has demonstrated improved LE strength overall which carries over for all functional mobility. Pt is working on w/c propulsion but is not yet at a supervision level secondary to poor coordination and weakness.   Patient continues to demonstrate the following deficits muscle weakness and muscle joint tightness and decreased standing balance, decreased balance strategies and difficulty maintaining precautions and therefore will continue to benefit from skilled PT intervention to increase functional independence with mobility.  Patient progressing toward long term goals..  Continue plan of care.  PT Short Term Goals Week 1:  PT Short Term Goal 1 (Week 1): Pt will perform all bed mobility with mod assist PT Short Term Goal 1 - Progress (Week 1): Met PT Short Term Goal 2 (Week 1): Pt will perform bed<>chair transfers with mod assist PT Short Term Goal 2 - Progress (Week 1): Met PT Short Term Goal 3 (Week 1): Pt will propel w/c with supervision x 150 ft PT Short Term Goal 3 - Progress (Week 1): Progressing toward goal Week 2:  PT Short Term Goal 1 (Week 2): Pt will perform bed mobility with min assist PT Short Term Goal 2 (Week 2): Pt will propel w/c x 150 ft with supervision PT Short Term Goal 3 (Week 2): Pt will perform sit<>stand with mod assist  Skilled Therapeutic Interventions/Progress Updates:   Pt seated in recliner upon PT arrival, agreeable to therapy tx and reports pain 5/10 in R LE. Pt  transferred from recliner>w/c with max assist using slideboard and lateral scoots. Pt worked on w/c propulsion using L UE/LE, therapist provided verbal and tactile cues for techniques, pt able to propel w/c with min assist needed occasionally for steering to correct veering to the R. Pt seated in w/c performed 2 x 10 active assisted SLR and hip abduction with R LE. In the parallel bars pt performed x 3 sit<>stands with max assist and manual facilitation for L hip/knee extension, second person to hold R LE to maintain NWB. Pt performed slideboard transfer from w/c>bed with moderate assist, towards R side while pushing through L LE and pushing through L UE on w/c arm rest to scoot. Pt transferred sitting EOB>sidelying on L>supine with mod assist to lift R LE, verbal cues for techniques. Pt left supine in bed with needs in reach.   Therapy Documentation Precautions:  Precautions Precautions: Fall, Cervical Precaution Comments: ROM as tolerated right knee, NWB R Knee Required Braces or Orthoses: Other Brace/Splint Cervical Brace: Hard collar, At all times Other Brace/Splint: hinged elbow brace for the right arm as well as hinged splint for left knee Restrictions Weight Bearing Restrictions: Yes RUE Weight Bearing: Non weight bearing RLE Weight Bearing: Non weight bearing LLE Weight Bearing: Weight bearing as tolerated Other Position/Activity Restrictions:     See Function Navigator for Current Functional Status.  Therapy/Group: Individual Therapy  Netta Corrigan, PT, DPT 03/03/2017, 2:58 PM

## 2017-03-03 NOTE — Progress Notes (Signed)
Physical Therapy Session Note  Patient Details  Name: Karina [name] MRN: [id number] Date of Birth: [birth year]  Today's Date: 03/03/2017 PT Individual Time: 1100-1200 PT Individual Time Calculation (min): 60 min   Short Term Goals: Week 2:  PT Short Term Goal 1 (Week 2): Pt will perform bed mobility with min assist PT Short Term Goal 2 (Week 2): Pt will propel w/c x 150 ft with supervision PT Short Term Goal 3 (Week 2): Pt will perform sit<>stand with mod assist  Skilled Therapeutic Interventions/Progress Updates:   Pt in w/c upon arrival and agreeable to therapy, no c/o pain. Total A w/c to therapy gym. Transferred to/from mat via slide board transfer from even level surfaces. Max A overall for transfer to/from w/ improved ability to use LUE and LLE to assist in transfer compared to previous sessions. Verbal and tactile cues for technique along w/ manual facilitation for weight shifting and RLE management. Worked on scooting on mat in both directions for increased independence w/ bed mobility, w/ visual, manual, and verbal cues for weight-shifting technique and utilizing LUE and LLEs in appropriate way. Transferred to supine w/ Mod A and performed RLE strengthening exercises as listed below w/ goal of pt being able to manage RLE independently during transfers and bed mobility while maintaining NWB precautions. Returned to EOB w/ Mod A and transferred back to w/c as w/ technique and assist detailed above. Pt self propelled w/c back to room w/ Max A using L hemi technique and transferred to recliner via slide board transfer Max A. Discussed importance of spending time outside of bed in between therapies to increase tolerance to OOB activity. Pt in agreement. Ended session in recliner, call bell within reach and all needs met.   RLE strengthening exercises:  -assisted SLR 2x10  -abduction slides 2x10  -assisted heel slides 2x10  -quad sets 2x10  -SAQs 2x10   Therapy  Documentation Precautions:  Precautions Precautions: Fall, Cervical Precaution Comments: ROM as tolerated right knee, NWB R Knee Required Braces or Orthoses: Other Brace/Splint Cervical Brace: Hard collar, At all times Other Brace/Splint: hinged elbow brace for the right arm as well as hinged splint for left knee Restrictions Weight Bearing Restrictions: Yes RUE Weight Bearing: Non weight bearing RLE Weight Bearing: Non weight bearing LLE Weight Bearing: Weight bearing as tolerated Other Position/Activity Restrictions:   Vital Signs: Therapy Vitals Temp: 98.7 F (37.1 C) Temp Source: Oral Pulse Rate: 92 Resp: 18 BP: 112/73 Patient Position (if appropriate): Lying Oxygen Therapy SpO2: 91 % O2 Device: Not Delivered  See Function Navigator for Current Functional Status.   Therapy/Group: Individual Therapy  Keyira Mondesir K Arnette 03/03/2017, 5:32 PM

## 2017-03-03 NOTE — Progress Notes (Signed)
Occupational Therapy Session Note  Patient Details  Name: Karina [name] MRN: [id number] Date of Birth: 06-22-1922  Today's Date: 03/03/2017 OT Individual Time: 4174-0814 OT Individual Time Calculation (min): 75 min    Short Term Goals: Week 1:  OT Short Term Goal 1 (Week 1): Pt will transition from supine to sit with mod assist in preparation for selfcare tasks.  OT Short Term Goal 2 (Week 1): Pt will complete UB bathing with mod assist sitting EOB.   OT Short Term Goal 3 (Week 1): Pt will donn pullover shirt with mod assist.   OT Short Term Goal 4 (Week 1): Pt will peform LB bathing supine to sit with mod assist.  OT Short Term Goal 5 (Week 1): Pt will perform sliding board transfer from wheelchair to drop arm commode with max assist.       Skilled Therapeutic Interventions/Progress Updates:  Pt seen this session for ADL training with a focus on tolerance to movement, unsupported sitting tolerance, transfers and adapted techniques.  Pt participated well today discussing how she had a rough night with her R big toe but that it was feeling better today.  Pt worked on LB bathing with max A from bed level then donned pants with max A. Sat to EOB with min-mod A and tolerated upright sitting for 30 min while working on UB self care. She did well doffing and donning pullover tops with cues and min A.  Pt concerned the slide board transfer would take too much work, but with cues to lean forward and lean towards opposite side she was able to scoot slide with max A of 1. Pt set up in w.c with all needs met.      Therapy Documentation Precautions:  Precautions Precautions: Fall, Cervical Precaution Comments: ROM as tolerated right knee, NWB R Knee Required Braces or Orthoses: Other Brace/Splint Cervical Brace: Hard collar, At all times Other Brace/Splint: hinged elbow brace for the right arm as well as hinged splint for left knee Restrictions Weight Bearing Restrictions: Yes RUE Weight  Bearing: Non weight bearing RLE Weight Bearing: Non weight bearing LLE Weight Bearing: Weight bearing as tolerated Other Position/Activity Restrictions:        Pain: Pain Assessment Pain Score: 2 \  ADL:      See Function Navigator for Current Functional Status.   Therapy/Group: Individual Therapy  [place] 03/03/2017, 12:27 PM

## 2017-03-03 NOTE — Progress Notes (Signed)
pt has stated "someone in this situation should be allowed to die", " I wish I would die" and reporting she does not want to be alone, wants someone to sit with her all of the time.? neuro psychology consult. Margarito Liner

## 2017-03-04 ENCOUNTER — Inpatient Hospital Stay (HOSPITAL_COMMUNITY): Payer: Medicare Other

## 2017-03-04 ENCOUNTER — Inpatient Hospital Stay (HOSPITAL_COMMUNITY): Payer: Medicare Other | Admitting: Occupational Therapy

## 2017-03-04 ENCOUNTER — Inpatient Hospital Stay (HOSPITAL_COMMUNITY): Payer: Medicare Other | Admitting: Speech Pathology

## 2017-03-04 NOTE — Progress Notes (Signed)
Physical Therapy Session Note  Patient Details  Name: Karina [name] MRN: [id number] Date of Birth: 06/14/1922  Today's Date: 03/04/2017 PT Individual Time: 1100-1157 PT Individual Time Calculation (min): 57 min   Short Term Goals: Week 2:  PT Short Term Goal 1 (Week 2): Pt will perform bed mobility with min assist PT Short Term Goal 2 (Week 2): Pt will propel w/c x 150 ft with supervision PT Short Term Goal 3 (Week 2): Pt will perform sit<>stand with mod assist  Skilled Therapeutic Interventions/Progress Updates:    Pt supine in bed upon PT arrival, agreeable to therapy tx and denies pain at rest. Pt transferred from supine to sitting EOB with min assist to help move RLE, HOB elevated and verbal cues for technique. Pt performed slideboard transfer with mod assist, verbal cues and facilitation to push through L LE and L UE scooting across board towards the weak side. Pt transported to gym in w/c total assist. Pt performed sit<>stands in parallel bars x 5 with max assist fading to mod assist with manual facilitation for hip/knee extension, 2nd person to hold R LE for NWB. While standing pt performed single leg mini squats x 5 with verbal cues for techniques, working on standing tolerance and L LE strengthening. In between standing pt performed LE strengthening exercises with R LE: AAROM hip flexion, LAQ and heel slides. Therapist performed manual knee flexion stretch 2 x 30 sec. Pt propelled w/c x 50 ft using L LE, verbal cues for techniques, min assist for steering. Pt transferred from w/c>recliner mod assist using slideboard. Left seated in recliner with needs in reach.   Therapy Documentation Precautions:  Precautions Precautions: Fall, Cervical Precaution Comments: ROM as tolerated right knee, NWB R Knee Required Braces or Orthoses: Other Brace/Splint Cervical Brace: Hard collar, At all times Other Brace/Splint: hinged elbow brace for the right arm as well as hinged splint for left  knee Restrictions Weight Bearing Restrictions: No RUE Weight Bearing: Non weight bearing RLE Weight Bearing: Non weight bearing LLE Weight Bearing: Weight bearing as tolerated Other Position/Activity Restrictions:     See Function Navigator for Current Functional Status.   Therapy/Group: Individual Therapy  Netta Corrigan, PT, DPT 03/04/2017, 2:03 PM

## 2017-03-04 NOTE — Plan of Care (Signed)
  RH BLADDER ELIMINATION RH STG MANAGE BLADDER WITH ASSISTANCE Description STG Manage Bladder With min Assistance Pt is incontinent 03/04/2017 1534 - Not Progressing by Ander Slade, RN 03/04/2017 1534 - Not Progressing by Ander Slade, RN

## 2017-03-04 NOTE — Progress Notes (Signed)
Occupational Therapy Session Note  Patient Details  Name: Karina [name] MRN: [id number] Date of Birth: 05-08-1922  Today's Date: 03/04/2017 OT Individual Time: 0900-1002 OT Individual Time Calculation (min): 62 min    Short Term Goals: Week 1:  OT Short Term Goal 1 (Week 1): Pt will transition from supine to sit with mod assist in preparation for selfcare tasks.  OT Short Term Goal 2 (Week 1): Pt will complete UB bathing with mod assist sitting EOB.   OT Short Term Goal 3 (Week 1): Pt will donn pullover shirt with mod assist.   OT Short Term Goal 4 (Week 1): Pt will peform LB bathing supine to sit with mod assist.  OT Short Term Goal 5 (Week 1): Pt will perform sliding board transfer from wheelchair to drop arm commode with max assist.   Skilled Therapeutic Interventions/Progress Updates:    Pt completed bathing and dressing supine to sit EOB.  She was able to wash her front peri area as well as her upper legs, supine with HOB elevated.  Transitioned to supine for rolling to wash buttocks with mod assist to roll and max assist for washing.  Donned brief and pull up pants in the same position secondary to pt not being able to stand and not tolerating lateral leans in sitting.  Pt transitioned to sitting for washing her UB with mod assist needed to wash the LUE.  Donned button up shirt with mod assist as well.  Completed sliding board transfer to the wheelchair with max assist as well.  She was able to brush her hair and apply lotion to her face with setup help only.  Pt left with call button and phone in reach.    Therapy Documentation Precautions:  Precautions Precautions: Fall, Cervical Precaution Comments: ROM as tolerated right knee, NWB R Knee Required Braces or Orthoses: Other Brace/Splint Cervical Brace: Hard collar, At all times Other Brace/Splint: hinged elbow brace for the right arm as well as hinged splint for left knee Restrictions Weight Bearing Restrictions: No RUE Weight  Bearing: Non weight bearing RLE Weight Bearing: Non weight bearing LLE Weight Bearing: Weight bearing as tolerated Other Position/Activity Restrictions:     Pain: Pain Assessment Pain Assessment: No/denies pain Faces Pain Scale: Hurts a little bit Pain Type: Acute pain Pain Location: Elbow Pain Descriptors / Indicators: Discomfort Pain Onset: With Activity Pain Intervention(s): Repositioned ADL: See Function Navigator for Current Functional Status.   Therapy/Group: Individual Therapy  Adynn Caseres OTR/L 03/04/2017, 12:33 PM
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
## 2017-03-04 NOTE — Progress Notes (Signed)
Occupational Therapy Weekly Progress Note  Patient Details  Name: Ragena Fiola MRN: [id number] Date of Birth: 08/03/1922  Beginning of progress report period: February 24, 2017 End of progress report period: March 04, 2017  Today's Date: 03/04/2017 OT Individual Time: 4098-1191 OT Individual Time Calculation (min): 57 min    Patient has met 3 of 5 short term goals.  Pt currently completes UB selfcare with mod assist in sitting on the EOB.  LB bathing currently is at a mod to max assist with total assist for LB dressing supine rolling, secondary to weightbearing precautions.  She continues to need mod -max ssist overall for sliding board transfers to the drop arm commode.  She needed total assist to clothing management in sitting with lateral leans side to side.  Pt is making slower progress overall secondary to limitations in not being able to use the RUE for support or AROM at this time and also not being able to weightbear over the LLE.  Feel overall that her functional gains will be limited by her weightbearing status.  Still feel she is appropriate for inpatient rehab but will need extensive assist at discharge.  May need SNF for follow-up unless 24 hour assist can be arranged.    Patient continues to demonstrate the following deficits: muscle weakness and muscle joint tightness, decreased memory and decreased standing balance, decreased balance strategies and difficulty maintaining precautions and therefore will continue to benefit from skilled OT intervention to enhance overall performance with BADL and Reduce care partner burden.  Patient progressing toward long term goals..  Continue plan of care.  OT Short Term Goals Week 2:  OT Short Term Goal 1 (Week 2): Pt will transition from supine to sit with mod assist in preparation for selfcare tasks.  OT Short Term Goal 2 (Week 2): Pt will peform LB bathing supine to sit with mod assist.  OT Short Term Goal 3 (Week 2): Pt will complete  toilet transfer with mod assist using the sliding board. OT Short Term Goal 4 (Week 2): Pt will complete toileting with max assist, lateral leans in sitting for clothing and hygiene  Skilled Therapeutic Interventions/Progress Updates:    Pt worked on functional transfers from bedside chair to EOB using the sliding board.  Pt able to scoot forward to the edge of the bedside chair with mod assist.  She was able to complete sliding board transfer to the bed with min/mod assist to the right and mod assist to the left.  She completed multiple times with max demonstrational cueing for setup and sequencing.  Pt left in bedside recliner at end of session with call button and phone.    Therapy Documentation Precautions:  Precautions Precautions: Fall, Cervical Precaution Comments: ROM as tolerated right knee, NWB R Knee Required Braces or Orthoses: Other Brace/Splint Cervical Brace: Hard collar, At all times Other Brace/Splint: hinged elbow brace for the right arm as well as hinged splint for left knee Restrictions Weight Bearing Restrictions: No RUE Weight Bearing: Non weight bearing RLE Weight Bearing: Non weight bearing LLE Weight Bearing: Weight bearing as tolerated Other Position/Activity Restrictions:    Pain: Pain Assessment Pain Assessment: No/denies pain ADL: See Function Navigator for Current Functional Status.   Therapy/Group: Individual Therapy  Moriya Mitchell OTR/L 03/04/2017, 2:13 PM
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
## 2017-03-04 NOTE — Progress Notes (Signed)
Speech Language Pathology Weekly Progress and Session Note  Patient Details  Name: Karina [name] MRN: [id number] Date of Birth: 15-Dec-1922  Beginning of progress report period: February 24, 2017  End of progress report period:  March 04, 2017   Today's Date: 03/04/2017 SLP Individual Time: 1033-1100 SLP Individual Time Calculation (min): 27 min  Short Term Goals: Week 1: SLP Short Term Goal 1 (Week 1): Pt will recall complex, new information with supevision cues for use of compensatory strategies. SLP Short Term Goal 1 - Progress (Week 1): Met SLP Short Term Goal 2 (Week 1): Pt will complete semi-complex tasks with supervision cues for functional problem solving.   SLP Short Term Goal 2 - Progress (Week 1): Progressing toward goal SLP Short Term Goal 3 (Week 1): Pt will selectively attend to tasks in a moderately distracting environment for 30 minutes with supervision verbal cues for redirection.   SLP Short Term Goal 3 - Progress (Week 1): Met    New Short Term Goals: Week 2: SLP Short Term Goal 1 (Week 2): Pt will recall complex, new information with mod I use of compensatory strategies. SLP Short Term Goal 2 (Week 2): Pt will complete semi-complex tasks with supervision cues for functional problem solving.   SLP Short Term Goal 3 (Week 2): Pt will selectively attend to tasks in a moderately distracting environment for 30 minutes with mod I redirection.    Weekly Progress Updates:  Pt has made functional gains this reporting period and has met 2 out of 3 short term goals.  Pt is supervision for semi-complex tasks and has demonstrated improved recall of daily information and selective attention to tasks.  Pt education is ongoing. Pt would continue to benefit from skilled ST while inpatient in order to maximize functional independence and reduce burden of care prior to discharge.  Do not anticipate ST needs at discharge.     Intensity: Minumum of 1-2 x/day, 30 to 90  minutes Frequency: 1 to 3 out of 7 days Duration/Length of Stay: 14-21 days, may be shorter for ST depending progress made while inpatient Treatment/Interventions: Cognitive remediation/compensation;Cueing hierarchy;Patient/family education;Environmental controls;Internal/external aids   Daily Session  Skilled Therapeutic Interventions: Pt was seen for skilled ST targeting cognitive goals.  Pt was sitting up in wheelchair upon therapist's arrival stating "You're not going to be smiling in a minute."  Pt reported that previous therapist had not left her a call bell within reach and she couldn't get help and had been incontinent of urine and bowel.  Call bell was in pt's lap.   Pt needed min assist verbal cues for use of call bell to convey needs to nursing.  Therapist assisted in transferring pt back to bed with +2 via slide board.  Pt left in bed with RN and nurse tech present.  Goals updated on this date to reflect current progress and plan of care.      Function:   Eating Eating                 Cognition Comprehension Comprehension assist level: Follows basic conversation/direction with no assist  Expression   Expression assist level: Expresses basic needs/ideas: With extra time/assistive device  Social Interaction Social Interaction assist level: Interacts appropriately 90% of the time - Needs monitoring or encouragement for participation or interaction.  Problem Solving Problem solving assist level: Solves basic 75 - 89% of the time/requires cueing 10 - 24% of the time  Memory Memory assist level: Recognizes or recalls 75 -  89% of the time/requires cueing 10 - 24% of the time   General    Pain Pain Assessment Pain Assessment: No/denies pain  Therapy/Group: Individual Therapy  Jailene Cupit, Selinda Orion 03/04/2017, 12:23 PM

## 2017-03-05 ENCOUNTER — Inpatient Hospital Stay (HOSPITAL_COMMUNITY): Payer: Medicare Other | Admitting: Occupational Therapy

## 2017-03-05 ENCOUNTER — Inpatient Hospital Stay (HOSPITAL_COMMUNITY): Payer: Medicare Other | Admitting: Physical Therapy

## 2017-03-05 ENCOUNTER — Inpatient Hospital Stay (HOSPITAL_COMMUNITY): Payer: Medicare Other

## 2017-03-05 ENCOUNTER — Inpatient Hospital Stay (HOSPITAL_COMMUNITY): Payer: Medicare Other | Admitting: Speech Pathology

## 2017-03-05 DIAGNOSIS — R3981 Functional urinary incontinence: Secondary | ICD-10-CM

## 2017-03-05 DIAGNOSIS — T148XXA Other injury of unspecified body region, initial encounter: Secondary | ICD-10-CM

## 2017-03-05 LAB — BASIC METABOLIC PANEL
Anion gap: 8 (ref 5–15)
BUN: 14 mg/dL (ref 6–20)
CHLORIDE: 92 mmol/L — AB (ref 101–111)
CO2: 30 mmol/L (ref 22–32)
CREATININE: 0.92 mg/dL (ref 0.44–1.00)
Calcium: 8.9 mg/dL (ref 8.9–10.3)
GFR calc Af Amer: >60 mL/min (ref >60)
GFR calc non Af Amer: 52 mL/min — ABNORMAL LOW (ref >60)
GLUCOSE: 113 mg/dL — AB (ref 65–99)
POTASSIUM: 4.7 mmol/L (ref 3.5–5.1)
Sodium: 130 mmol/L — ABNORMAL LOW (ref 135–145)

## 2017-03-05 LAB — CBC WITH DIFFERENTIAL/PLATELET
Basophils Absolute: 0.1 10*3/uL (ref 0.0–0.1)
Basophils Relative: 0 %
EOS ABS: 0.3 10*3/uL (ref 0.0–0.7)
EOS PCT: 3 %
HCT: 35 % — ABNORMAL LOW (ref 36.0–46.0)
Hemoglobin: 11 g/dL — ABNORMAL LOW (ref 12.0–15.0)
LYMPHS ABS: 1.3 10*3/uL (ref 0.7–4.0)
LYMPHS PCT: 10 %
MCH: 29.9 pg (ref 26.0–34.0)
MCHC: 31.4 g/dL (ref 30.0–36.0)
MCV: 95.1 fL (ref 78.0–100.0)
MONOS PCT: 10 %
Monocytes Absolute: 1.2 10*3/uL — ABNORMAL HIGH (ref 0.1–1.0)
Neutro Abs: 10.1 10*3/uL — ABNORMAL HIGH (ref 1.7–7.7)
Neutrophils Relative %: 77 %
PLATELETS: 543 10*3/uL — AB (ref 150–400)
RBC: 3.68 MIL/uL — AB (ref 3.87–5.11)
RDW: 14.9 % (ref 11.5–15.5)
WBC: 12.9 10*3/uL — ABNORMAL HIGH (ref 4.0–10.5)

## 2017-03-05 NOTE — Progress Notes (Signed)
Occupational Therapy Note  Patient Details  Name: Karina [name] MRN: [id number] Date of Birth: 10-Nov-1922  Today's Date: 03/05/2017 OT Individual Time: 1030-1100 OT Individual Time Calculation (min): 30 min   Pt denies pain Individual Therapy  Pt resting in bed upon arrival with friends present.  Pt requested to delay therapy but pt's friends stated they were leaving and would come back later.  Pt agreeable to participating in therapy.  Pt required min A for supine>sit EOB (HOB elevated) in preparation for slide board transfer to w/c.  Pt performed lateral lean to facilitate placement of slide board.  Pt wearing shorts which complicated placement of board.  Pt required max A for slide board transfer to w/c.  Pt requested instructions on use of "devices" (call bell/TV remote and phone). Pt required max verbal cues/A for use of remote/call bell. Pt agreed to sit in w/c through lunch.  Pt remained in w/c with QRB in place and all needs within reach.    Leotis Shames [facility] 03/05/2017, 11:12 AM
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
## 2017-03-05 NOTE — Progress Notes (Signed)
Informed by Assisgned NT that she removed and replaced patient's Right arm  Hinge brace.
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
## 2017-03-05 NOTE — Progress Notes (Signed)
Physical Therapy Session Note  Patient Details  Name: Karina [name] MRN: [id number] Date of Birth: 12-30-1922  Today's Date: 03/05/2017 PT Individual Time: 0800-0902 PT Individual Time Calculation (min): 62 min   Short Term Goals: Week 2:  PT Short Term Goal 1 (Week 2): Pt will perform bed mobility with min assist PT Short Term Goal 2 (Week 2): Pt will propel w/c x 150 ft with supervision PT Short Term Goal 3 (Week 2): Pt will perform sit<>stand with mod assist  Skilled Therapeutic Interventions/Progress Updates:   Pt supine upon arrival and agreeable to therapy, no c/o pain. Worked on independence w/ functional mobility this session. Verbal cues throughout session for pt to assist w/ mobility by lifting RLE on her own during transitional movements such as supine<>sit and while rolling. Overall, she required 50% for RLE management during mobility. Assisted pt in donning shorts in supine w/ Min A for rolling and Total A to pull pants over LEs. Transferred to EOB w/ Mod A via log roll and worked on postural control in sitting w/o UE support. Required supervision overall for sitting balance, pt required LUE support every 4-5 min 2/2 fatigue. Verbal, tactile, and visual cues for posture while performing functional tasks w/ LUE during eating breakfast. Worked on lateral scooting for bed mobility and slideboard transfers, increased ability to clear buttocks from bed this session, however still requires frequent cues. Additionally practiced mulitple sit<>supine transfers w/ Min A to Min guard overall and verbal cues for technique via log roll. Returned to supine and ended session in supine, call bell within reach and all needs met.   Therapy Documentation Precautions:  Precautions Precautions: Fall, Cervical Precaution Comments: ROM as tolerated right knee, NWB R Knee Required Braces or Orthoses: Other Brace/Splint Cervical Brace: Hard collar, At all times Other Brace/Splint: hinged elbow brace  for the right arm as well as hinged splint for left knee Restrictions Weight Bearing Restrictions: Yes RUE Weight Bearing: Non weight bearing RLE Weight Bearing: Non weight bearing LLE Weight Bearing: Weight bearing as tolerated Other Position/Activity Restrictions:    See Function Navigator for Current Functional Status.   Therapy/Group: Individual Therapy  Lorretta Kerce K Arnette 03/05/2017, 2:32 PM

## 2017-03-05 NOTE — Progress Notes (Signed)
Speech Language Pathology Daily Session Note  Patient Details  Name: Karina [name] MRN: [id number] Date of Birth: [birth year]  Today's Date: 03/05/2017 SLP Individual Time: 1445-1530 SLP Individual Time Calculation (min): 45 min  Short Term Goals: Week 2: SLP Short Term Goal 1 (Week 2): Pt will recall complex, new information with mod I use of compensatory strategies. SLP Short Term Goal 2 (Week 2): Pt will complete semi-complex tasks with supervision cues for functional problem solving.   SLP Short Term Goal 3 (Week 2): Pt will selectively attend to tasks in a moderately distracting environment for 30 minutes with mod I redirection.    Skilled Therapeutic Interventions:  Pt was seen for skilled ST targeting cognitive goals.  Session began late as pt became incontinent of bowel and bladder upon therapist's arrival.   Pt initially resistant to getting on bedpan due to frequency and urgency of voiding episodes.  She told therapist that she would be "fine to wait 45 minutes" to get cleaned up to make sure she was actually finished voiding.  Therapist discussed the importance of timely hygiene following an incontinence episode to reduce risk of skin breakdown.  Pt reports that she has awareness of needing to have a bowel movement but she made no attempts to ask therapist for assistance until she had actually been incontinent.  Therapist discussed the importance of anticipating toileting needs and calling for assistance quickly.  Pt verbalized understanding.  Pt assisted on and off bedpan.  Hygiene completed with assistance from nurse tech and pt left in bed with tech at bedside.  Continue per current plan of care.    Function:  Eating Eating                 Cognition Comprehension Comprehension assist level: Follows basic conversation/direction with no assist  Expression   Expression assist level: Expresses basic needs/ideas: With extra time/assistive device  Social Interaction Social  Interaction assist level: Interacts appropriately 90% of the time - Needs monitoring or encouragement for participation or interaction.  Problem Solving Problem solving assist level: Solves basic 75 - 89% of the time/requires cueing 10 - 24% of the time  Memory Memory assist level: Recognizes or recalls 75 - 89% of the time/requires cueing 10 - 24% of the time    Pain Pain Assessment Pain Assessment: No/denies pain  Therapy/Group: Individual Therapy  Cerra Eisenhower, Selinda Orion 03/05/2017, 3:51 PM

## 2017-03-05 NOTE — Progress Notes (Signed)
Occupational Therapy Session Note  Patient Details  Name: Karina [name] MRN: [id number] Date of Birth: 1923-02-03  Today's Date: 03/05/2017 OT Individual Time: 1300-1401 OT Individual Time Calculation (min): 61 min    Short Term Goals: Week 2:  OT Short Term Goal 1 (Week 2): Pt will transition from supine to sit with mod assist in preparation for selfcare tasks.  OT Short Term Goal 2 (Week 2): Pt will peform LB bathing supine to sit with mod assist.  OT Short Term Goal 3 (Week 2): Pt will complete toilet transfer with mod assist using the sliding board. OT Short Term Goal 4 (Week 2): Pt will complete toileting with max assist, lateral leans in sitting for clothing and hygiene  Skilled Therapeutic Interventions/Progress Updates:    Pt completed bathing and dressing sitting on the EOB during session.  Min assist for transition to sitting from supine.  Pt needed max assist for removal of right arm splint and right hinged knee splint in order to wash.  Nursing had assisted pt with washing peri area, donning new brief and shorts earlier prior to session, so did not work on this part of bathing.  She was able to reach down to her lower leg at the ankle for washing the LLE but only reached just past the right knee for washing the right leg.  Min assist for washing her UB with mod assist for donning button up shirt.  Finished session with sliding board transfer to the wheelchair with mod assist using sliding board.  Pt left in wheelchair with call button and phone in reach and safety belt in place.     Therapy Documentation Precautions:  Precautions Precautions: Fall, Cervical Precaution Comments: ROM as tolerated right knee, NWB R Knee Required Braces or Orthoses: Other Brace/Splint Cervical Brace: Hard collar, At all times Other Brace/Splint: hinged elbow brace for the right arm as well as hinged splint for left knee Restrictions Weight Bearing Restrictions: Yes RUE Weight Bearing: Non  weight bearing RLE Weight Bearing: Non weight bearing LLE Weight Bearing: Weight bearing as tolerated Other Position/Activity Restrictions:    Pain: Pain Assessment Pain Assessment: No/denies pain Pain Type: Acute pain Pain Location: Leg Pain Orientation: Right Pain Descriptors / Indicators: Discomfort Pain Onset: With Activity ADL: See Function Navigator for Current Functional Status.   Therapy/Group: Individual Therapy  Syana Degraffenreid OTR/L 03/05/2017, 3:54 PM

## 2017-03-06 ENCOUNTER — Inpatient Hospital Stay (HOSPITAL_COMMUNITY): Payer: Medicare Other | Admitting: Occupational Therapy

## 2017-03-06 ENCOUNTER — Inpatient Hospital Stay (HOSPITAL_COMMUNITY): Payer: Medicare Other | Admitting: Physical Therapy
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
## 2017-03-06 NOTE — Progress Notes (Signed)
In regards to hinged knee brace right lower extremity. Discuss with orthopedic services Hilbert Odor physician assistant. Hinged knee brace can be discontinued continue nonweightbearing status

## 2017-03-06 NOTE — Significant Event (Signed)
Patient continues to refuse Miralax at 0800. Patient states to nursing that she feels "too loose." Patient is incontinent of bowel with the LBM 03/05/17. Nursing will continue to work on timed toileting and continence issues with bowel and bladder.

## 2017-03-06 NOTE — Progress Notes (Signed)
Occupational Therapy Session Note  Patient Details  Name: Karina [name] MRN: [id number] Date of Birth: 1923-01-26  Today's Date: 03/06/2017 OT Individual Time: 0800-0900 OT Individual Time Calculation (min): 60 min    Short Term Goals: Week 2:  OT Short Term Goal 1 (Week 2): Pt will transition from supine to sit with mod assist in preparation for selfcare tasks.  OT Short Term Goal 2 (Week 2): Pt will peform LB bathing supine to sit with mod assist.  OT Short Term Goal 3 (Week 2): Pt will complete toilet transfer with mod assist using the sliding board. OT Short Term Goal 4 (Week 2): Pt will complete toileting with max assist, lateral leans in sitting for clothing and hygiene  Skilled Therapeutic Interventions/Progress Updates:    Pt completed bathing and dressing during session supine to sit.  She was able to roll side to side in the bed with min assist using the rails in order for therapist to assist with washing buttocks.  In supine with HOB elevated she was able to wash her front peri area and her upper legs.  Therapist provided total assist for removal of right knee brace for washing and donning pants.  Therapist assisted with donning pants with pt rolling in supine to pull them over hips.  She transitioned to sitting with min assist for work on UB dressing and bathing.  Therapist provided total assist for removal of right elbow brace as well as min assist for washing her LUE.  She was able to donn button up shirt with min assist following correct technique.  Elbow brace and knee brace both donned with total assist.  Pt then transferred to the wheelchair via sliding board with mod facilitation.  Pt left up in wheelchair with call button and phone in reach.  Nursing notified to assist with setup of breakfast.  Also requested assist with rinseless shampoo cap.    Therapy Documentation Precautions:  Precautions Precautions: Fall, Cervical Precaution Comments: ROM as tolerated right knee,  NWB R Knee Required Braces or Orthoses: Other Brace/Splint Cervical Brace: Hard collar, At all times Other Brace/Splint: hinged elbow brace for the right arm as well as hinged splint for left knee Restrictions Weight Bearing Restrictions: Yes RUE Weight Bearing: Non weight bearing RLE Weight Bearing: Non weight bearing LLE Weight Bearing: Weight bearing as tolerated Other Position/Activity Restrictions:   Pain: Pain Assessment Pain Assessment: Faces Faces Pain Scale: Hurts little more Pain Type: Surgical pain Pain Location: Leg Pain Orientation: Right Pain Descriptors / Indicators: Discomfort Pain Onset: With Activity Pain Intervention(s): Repositioned ADL:  See Function Navigator for Current Functional Status.   Therapy/Group: Individual Therapy  Teliah Buffalo OTR/L 03/06/2017, 9:19 AM

## 2017-03-06 NOTE — Progress Notes (Signed)
Physical Therapy Session Note  Patient Details  Name: Karina [name] MRN: [id number] Date of Birth: January 16, 1923  Today's Date: 03/06/2017 PT Individual Time: 0601-5615 AND 1400-1500 PT Individual Time Calculation (min): 60 min AND 60 min   Short Term Goals: Week 2:  PT Short Term Goal 1 (Week 2): Pt will perform bed mobility with min assist PT Short Term Goal 2 (Week 2): Pt will propel w/c x 150 ft with supervision PT Short Term Goal 3 (Week 2): Pt will perform sit<>stand with mod assist  Skilled Therapeutic Interventions/Progress Updates:   Session 1:  Pt in w/c upon arrival and agreeable to therapy, no c/o pain. RN present and reporting that ortho MD discontinued RLE hinge brace, NWB RLE orders remain. Total A w/c transport to/from gym for time management. Worked on functional LUE/LE strengthening this session for ease and independence w/ scooting, sit<>stands, and bed mobility. Attempted sit<>stand in parallel bars, pt unable to clear buttocks from seat w/ Max A. Transferred to mat via slide board transfer w/ Max A. Encouraged pt to verbalize steps of transfer in preparation for when she would need to instruct a caregiver. Max cues for pt to verbalize steps of transfer. Performed lateral scooting along mat w/ Min A for RLE management and frequent cues for technique. By end of practice, pt able to scoot lateral w/o assist at hips and trunk and able to clear buttocks from bed to minimize skin breakdown. Discussed carry over of lateral scooting during slide board transfer back to w/c, transfer to w/c w/ Min A overall for RLE management and much improved from first transfer. Returned to room in w/c Total A and performed slide board transfer to EOB and to supine w/ Min A overall. Pt requesting to toilet, however visitors in room, alerted NT to pt's request to use bedpan. Ended session in supine, call bell within reach and all needs met.   Session 2:  Pt in supine upon arrival and agreeable to  session, no c/o pain. Transferred to EOB and to w/c via slide board transfer w/ Mod A. Focused on w/c mobility this session w/ Mod A overall to self-propel w/c using L hemi technique. No cushion on w/c to allow for greatest success and assess for using hemi-height w/c in future. Occasional bouts of 5-10' w/ supervision. Frequent rest breaks every 40-50' 2/2 fatigue. Manual, visual, and verbal cues for technique and facilitating use of LLE to steer. Returned to room and pt requesting to toilet. Transferred to drop arm commode and to EOB after continent BM w/ Max A overall 2/2 uneven height of transfer surfaces. Ended session in supine, call bell within reach and all needs met.   Therapy Documentation Precautions:  Precautions Precautions: Fall, Cervical Precaution Comments: ROM as tolerated right knee, NWB R Knee Required Braces or Orthoses: Other Brace/Splint Cervical Brace: Hard collar, At all times Other Brace/Splint: hinged elbow brace for the right arm as well as hinged splint for left knee Restrictions Weight Bearing Restrictions: Yes RUE Weight Bearing: Non weight bearing RLE Weight Bearing: Non weight bearing LLE Weight Bearing: Weight bearing as tolerated Other Position/Activity Restrictions:   Pain:    See Function Navigator for Current Functional Status.   Therapy/Group: Individual Therapy  Suki Crockett K Arnette 03/06/2017, 8:21 PM

## 2017-03-06 NOTE — Progress Notes (Signed)
Social Work Patient ID: [name], female   DOB: 1922/11/16, 83 y.o.   MRN: [id number]   CSW met with pt and then spoke with son via telephone on 03-05-17 to update them on team conference discussion and to ask them what the plan is for pt at d/c from CIR.  Explained the d/c date was moved from 03-20-17 to 03-13-17 due to pt meeting CIR goals by then with current NWB status.  Pt referred to her son for information on what her d/c plan would be and son stated that he wants to see what pt wants to do and have an impartial person ask her that.  CSW suggested that son discuss what is possible re: home caregivers and then CSW would f/u to see if pt would prefer home with caregivers vs. SNF.  Son was made aware by admissions coordinators that insurance may not cover SNF for pt after CIR.  CSW explained that if this is their decision, then CSW would pursue it and see what insurance says and we can go from there.  CSW to f/u with pt today regarding her preference.

## 2017-03-07 ENCOUNTER — Inpatient Hospital Stay (HOSPITAL_COMMUNITY): Payer: Medicare Other | Admitting: Physical Therapy

## 2017-03-07 DIAGNOSIS — I2510 Atherosclerotic heart disease of native coronary artery without angina pectoris: Secondary | ICD-10-CM

## 2017-03-07 DIAGNOSIS — Z950 Presence of cardiac pacemaker: Secondary | ICD-10-CM

## 2017-03-07 DIAGNOSIS — I480 Paroxysmal atrial fibrillation: Secondary | ICD-10-CM
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
## 2017-03-07 NOTE — Progress Notes (Signed)
Physical Therapy Session Note  Patient Details  Name: Karina [name] MRN: [id number] Date of Birth: 12/11/1922  Today's Date: 03/07/2017 PT Individual Time: 0915-1015 AND 1345-1405 PT Individual Time Calculation (min): 60 min AND 20 min  Short Term Goals: Week 2:  PT Short Term Goal 1 (Week 2): Pt will perform bed mobility with min assist PT Short Term Goal 2 (Week 2): Pt will propel w/c x 150 ft with supervision PT Short Term Goal 3 (Week 2): Pt will perform sit<>stand with mod assist  Skilled Therapeutic Interventions/Progress Updates:   Session 1:  Pt in w/c upon arrival and agreeable to therapy, no c/o pain. Hinged elbow brace not on and per pt and NT, they were unable to appropriately don brace this morning. Total A to don elbow brace and transferred to EOB w/ Mod A and to recliner w/ Min A using slide board. Verbal, visual, and manual cues for lateral scooting technique. Pt able to perform most of transfer w/o manual assist w/ increased time. Mod A to reposition in recliner. Pt requesting coffee and performed set-up assist for pt to put cream and sugar in coffee 2/2 functional use of 1 UE. Worked on postural strength and control while performing task in sitting w/o UE support. Worked on RLE strengthening in seated performing LAQs 2x10, quad sets 2x10, and abduction slides. Discussed d/c plan during rest breaks. Discussed benefits of going to another facility and benefits of going home if pt is able to pay for 24/7 care. Discussed need for 24/7 caregivers to come in for education should pt decide to go this route, pt verbalized understanding. Ended session in recliner, call bell within reach and all needs met.   Session 2:  Pt in recliner upon arrival and agreeable to therapy, no c/o pain. Requesting if PT can assist pt in washing hair. Placed pt in reclined position and provided set-up assist for washing hair out of basin. Mod A overall for washing technique 2/2 functional use of LUE only.  Discussed having a caregiver perform similar assist at home or another facility until pt's c-collar is discharged. Pt in agreement. Repositioned in upright w/ Min A and pt in agreement to remain sitting upright for a few more hours to increase tolerance to upright. Ended session in recliner, call bell within reach and all needs met.   Therapy Documentation Precautions:  Precautions Precautions: Fall, Cervical Precaution Comments: ROM as tolerated right knee, NWB R Knee Required Braces or Orthoses: Other Brace/Splint Cervical Brace: Hard collar, At all times Other Brace/Splint: hinged elbow brace for the right arm as well as hinged splint for left knee Restrictions Weight Bearing Restrictions: Yes RUE Weight Bearing: Non weight bearing RLE Weight Bearing: Non weight bearing LLE Weight Bearing: Weight bearing as tolerated Other Position/Activity Restrictions:    See Function Navigator for Current Functional Status.   Therapy/Group: Individual Therapy   K Arnette 03/07/2017, 12:36 PM  

## 2017-03-08 ENCOUNTER — Inpatient Hospital Stay (HOSPITAL_COMMUNITY): Payer: Medicare Other

## 2017-03-08 DIAGNOSIS — S7292XG Unspecified fracture of left femur, subsequent encounter for closed fracture with delayed healing: Secondary | ICD-10-CM

## 2017-03-08 MED ORDER — OXYCODONE HCL ER 10 MG PO T12A
10.0000 mg | EXTENDED_RELEASE_TABLET | Freq: Every day | ORAL | Status: DC
  Administered 2017-03-08 – 2017-03-19 (×12): 10 mg via ORAL
  Filled 2017-03-08 (×12): qty 1

## 2017-03-08 NOTE — Progress Notes (Signed)
Physical Therapy Session Note  Patient Details  Name: Karina [name] MRN: [id number] Date of Birth: March 12, 1923  Today's Date: 03/08/2017 PT Individual Time: 1615-1700 PT Individual Time Calculation (min): 45 min   Short Term Goals: Week 2:  PT Short Term Goal 1 (Week 2): Pt will perform bed mobility with min assist PT Short Term Goal 2 (Week 2): Pt will propel w/c x 150 ft with supervision PT Short Term Goal 3 (Week 2): Pt will perform sit<>stand with mod assist  Skilled Therapeutic Interventions/Progress Updates:    Pt supine in bed upon PT arrival, agreeable to therapy tx and denies pain at rest. Pt transferred from supine to sitting EOB with supervision and verbal cues for techniques, able to slide both LEs off the bed and push her trunk up using L UE on bedrail. Pt performed slideboard transfer from bed>w/c with min assist, verbal cues for techniques and manual facilitation for weightbearing and pushing through L LE. Pt seated in w/c, therapist performed 3 x 30 sec R knee flexion stretch for ROM. Pt seated in w/c performed R LE strengthening therex: 2 x 10 LAQ, 2 x 10 SLR, 2 x 10 heel slides, 2 x 10 marches and 2 x 10 hip abduction. Pt left seated in w/c at end of session with needs in reach.   Therapy Documentation Precautions:  Precautions Precautions: Fall, Cervical Precaution Comments: ROM as tolerated right knee, NWB R Knee Required Braces or Orthoses: Other Brace/Splint Cervical Brace: Hard collar, At all times Other Brace/Splint: hinged elbow brace for the right arm as well as hinged splint for left knee Restrictions Weight Bearing Restrictions: Yes RUE Weight Bearing: Non weight bearing RLE Weight Bearing: Non weight bearing LLE Weight Bearing: Weight bearing as tolerated Other Position/Activity Restrictions:     See Function Navigator for Current Functional Status.   Therapy/Group: Individual Therapy  Netta Corrigan, PT, DPT 03/08/2017, 8:02 AM
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
## 2017-03-09 ENCOUNTER — Inpatient Hospital Stay (HOSPITAL_COMMUNITY): Payer: Medicare Other

## 2017-03-09 ENCOUNTER — Encounter (HOSPITAL_COMMUNITY): Payer: Medicare Other | Admitting: Psychology

## 2017-03-09 ENCOUNTER — Telehealth: Payer: Self-pay | Admitting: Cardiology

## 2017-03-09 ENCOUNTER — Inpatient Hospital Stay (HOSPITAL_COMMUNITY): Payer: Medicare Other | Admitting: Occupational Therapy

## 2017-03-09 DIAGNOSIS — S7292XS Unspecified fracture of left femur, sequela: Secondary | ICD-10-CM

## 2017-03-09 NOTE — Telephone Encounter (Signed)
LMOVM for pt to return call. Home monitor has not updated since 02-14-2017.
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
## 2017-03-09 NOTE — Progress Notes (Signed)
Occupational Therapy Session Note  Patient Details  Name: Karina [name] MRN: [id number] Date of Birth: January 12, 1923  Today's Date: 03/09/2017 OT Individual Time: 3382-5053 OT Individual Time Calculation (min): 43 min    Short Term Goals: Week 2:  OT Short Term Goal 1 (Week 2): Pt will transition from supine to sit with mod assist in preparation for selfcare tasks.  OT Short Term Goal 2 (Week 2): Pt will peform LB bathing supine to sit with mod assist.  OT Short Term Goal 3 (Week 2): Pt will complete toilet transfer with mod assist using the sliding board. OT Short Term Goal 4 (Week 2): Pt will complete toileting with max assist, lateral leans in sitting for clothing and hygiene  Skilled Therapeutic Interventions/Progress Updates:    Pt in wheelchair to start session.  Therapist removed elbow splint and performed retrograde massage to the right hand and forearm secondary to increased edema noted.  Educated pt on AROM exercises for the right hand after completion of massage, which pt was able to return demonstrate.  Therapist also provided small yellow foam block for pt to work on squeezing as well to help resolve edema.  Next worked on transfers from wheelchair to bed with use of the sliding board.  She was able to complete with mod assist, scooting to the left side.  Mod assist also needed for transition to supine and lift LEs into the bed as well.  Finished session with RUE elevated on pillows, with the hand higher than the elbow to help with decreasing edema.  Call button and phone in reach.    Therapy Documentation Precautions:  Precautions Precautions: Fall, Cervical Precaution Comments: ROM as tolerated right knee, NWB R Knee Required Braces or Orthoses: Other Brace/Splint Cervical Brace: Hard collar, At all times Other Brace/Splint: hinged elbow brace for the right arm  Restrictions Weight Bearing Restrictions: Yes RUE Weight Bearing: Non weight bearing RLE Weight Bearing: Non  weight bearing LLE Weight Bearing: Weight bearing as tolerated Other Position/Activity Restrictions:    Pain: Pain Assessment Pain Assessment: Faces Faces Pain Scale: Hurts a little bit Pain Type: Surgical pain Pain Location: Leg Pain Orientation: Right Pain Descriptors / Indicators: Discomfort Pain Onset: With Activity Pain Intervention(s): Repositioned ADL: See Function Navigator for Current Functional Status.   Therapy/Group: Individual Therapy  Evonda Enge OTR/L 03/09/2017, 3:48 PM

## 2017-03-09 NOTE — Progress Notes (Signed)
Occupational Therapy Session Note  Patient Details  Name: Karina [name] MRN: [id number] Date of Birth: 11-Mar-1923  Today's Date: 03/09/2017 OT Individual Time: 0910-1020 OT Individual Time Calculation (min): 70 min    Short Term Goals: Week 2:  OT Short Term Goal 1 (Week 2): Pt will transition from supine to sit with mod assist in preparation for selfcare tasks.  OT Short Term Goal 2 (Week 2): Pt will peform LB bathing supine to sit with mod assist.  OT Short Term Goal 3 (Week 2): Pt will complete toilet transfer with mod assist using the sliding board. OT Short Term Goal 4 (Week 2): Pt will complete toileting with max assist, lateral leans in sitting for clothing and hygiene  Skilled Therapeutic Interventions/Progress Updates:    Pt completed bathing and dressing during session.  Mod assist for rolling side to side in the flat bed in order for therapist to assist with washing buttocks and donning new brief.  Once this was completed, had pt transition to sitting for other bathing and dressing.  Had pt use reacher in the left hand for doffing gripper socks.  She was not able to reach her feet for bathing but could wash other parts of both legs.  Would benefit from [facility] sponge, but none available at this time.  She was able to donn her pants over both feet and pull up above knees with mod demonstrational cueing.  Total assist for pulling over hips with lateral leans side to side.  Total assist for TEDs and gripper socks.  Finished session with transfer to the wheelchair with mod assist via sliding board.  Pt left with PT to finish grooming tasks.   Therapy Documentation Precautions:  Precautions Precautions: Fall, Cervical Precaution Comments: ROM as tolerated right knee, NWB R Knee Required Braces or Orthoses: Other Brace/Splint Cervical Brace: Hard collar, At all times Other Brace/Splint: hinged elbow brace for the right arm  Restrictions Weight Bearing Restrictions: Yes RUE Weight  Bearing: Non weight bearing RLE Weight Bearing: Non weight bearing LLE Weight Bearing: Weight bearing as tolerated Other Position/Activity Restrictions:     Pain: Pain Assessment Pain Assessment: Faces Faces Pain Scale: Hurts a little bit Pain Location: Elbow Pain Orientation: Right Pain Intervention(s): Repositioned ADL: See Function Navigator for Current Functional Status.   Therapy/Group: Individual Therapy  Latroya Ng OTR/L 03/09/2017, 10:26 AM

## 2017-03-09 NOTE — Discharge Instructions (Signed)
Inpatient Rehab Discharge Instructions  Joannie Medine Discharge date and time: No discharge date for patient encounter.   Activities/Precautions/ Functional Status: Activity: Nonweightbearing right upper and lower extremity. Hinged elbow brace right upper extremity Diet: regular diet Wound Care: keep wound clean and dry Functional status:  ___ No restrictions     ___ Walk up steps independently ___ 24/7 supervision/assistance   ___ Walk up steps with assistance ___ Intermittent supervision/assistance  ___ Bathe/dress independently ___ Walk with walker     _x__ Bathe/dress with assistance ___ Walk Independently    ___ Shower independently ___ Walk with assistance    ___ Shower with assistance ___ No alcohol     ___ Return to work/school ________  Special Instructions:    My questions have been answered and I understand these instructions. I will adhere to these goals and the provided educational materials after my discharge from the hospital.  Patient/Caregiver Signature _______________________________ Date __________  Clinician Signature _______________________________________ Date __________  Please bring this form and your medication list with you to all your follow-up doctor's appointments.
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
## 2017-03-09 NOTE — Progress Notes (Signed)
Physical Therapy Session Note  Patient Details  Name: Karina [name] MRN: [id number] Date of Birth: 10/05/1922  Today's Date: 03/09/2017 PT Individual Time: 1020-1100, 1345-1415 PT Individual Time Calculation (min): 40 min , 30 min   Short Term Goals: Week 2:  PT Short Term Goal 1 (Week 2): Pt will perform bed mobility with min assist PT Short Term Goal 2 (Week 2): Pt will propel w/c x 150 ft with supervision PT Short Term Goal 3 (Week 2): Pt will perform sit<>stand with mod assist  Skilled Therapeutic Interventions/Progress Updates:    Session 1: Pt received from OT, seated in w/c in room, agreeable to therapy tx. Pt denies pain at this time. Pt brushed teeth at the sink with set up assist to retrieve items. Therapist donned pts L AFO and shoe total assist. Pt transported to gym in w/c total assist. Pt performed x 4 sit<>stands this session with mod assist from therapist, using L parallel bar for L UE support while holding R LE up to maintain NWB. In standing pt worked on Dietitian and strengthening: pt performed 2 x 10 hip abduction with R LE and pt performed x 10 single leg mini squats with L LE. During seated rest breaks between standing therapist performed R knee flexion stretches 3 x 30 sec and pt performed R LE 2 x 10 LAQ. Pt left seated in w/c at end of session, hand off to ST.   Session 2: Pt seated in w/c upon PT arrival, agreeable to therapy tx and denies pain. Session focused on functional transfer practice. Pt performed x 2 slideboard transfers from w/c<>bed with min assist and verbal cues for technique. Therapist performed R knee flexion ROM 2 x 30 sec, pt performed 2 x 10 LAQ and 2 x 10 hip abduction. Pt concerned about her swelling, therapist suggested ice and elevation with pt agreeable. Pt left seated in recliner with needs in reach, ice applied to R knee and SW present.   Therapy Documentation Precautions:  Precautions Precautions: Fall, Cervical Precaution Comments:  ROM as tolerated right knee, NWB R Knee Required Braces or Orthoses: Other Brace/Splint Cervical Brace: Hard collar, At all times Other Brace/Splint: hinged elbow brace for the right arm  Restrictions Weight Bearing Restrictions: Yes RUE Weight Bearing: Non weight bearing RLE Weight Bearing: Non weight bearing LLE Weight Bearing: Weight bearing as tolerated Other Position/Activity Restrictions:     See Function Navigator for Current Functional Status.   Therapy/Group: Individual Therapy  Netta Corrigan, PT, DPT 03/09/2017, 11:18 AM

## 2017-03-09 NOTE — Progress Notes (Signed)
Speech Language Pathology Daily Session Note  Patient Details  Name: Karina [name] MRN: [id number] Date of Birth: 1922-04-22  Today's Date: 03/09/2017 SLP Individual Time: 1100-1200 SLP Individual Time Calculation (min): 60 min  Short Term Goals: Week 2: SLP Short Term Goal 1 (Week 2): Pt will recall complex, new information with mod I use of compensatory strategies. SLP Short Term Goal 2 (Week 2): Pt will complete semi-complex tasks with supervision cues for functional problem solving.   SLP Short Term Goal 3 (Week 2): Pt will selectively attend to tasks in a moderately distracting environment for 30 minutes with mod I redirection.    Skilled Therapeutic Interventions: Skilled ST services focused on cognitive skills. Pt requested positioning change of right leg and to tighten aspen collar, directing care at Mod I level. Pt demonstrated ability to recall piror medications and unaware of current medication, SLP reviewed current medications utilizing medication list for visual aid in recall. Pt demonstrated ability recall medication by name and functional with supervision questions to Mod I level to utilize visual aid. Pt demonstrated selective attention during 60 minute session with Mod I cues for redirection. SLP left medication list in top drawer to complete pill box organization on next visit. Pt was left in room with call bell within reach and meal tray set up. Recommend to continue skilled ST services.     Function:  Eating Eating                 Cognition Comprehension Comprehension assist level: Follows basic conversation/direction with no assist  Expression   Expression assist level: Expresses basic needs/ideas: With extra time/assistive device  Social Interaction Social Interaction assist level: Interacts appropriately 90% of the time - Needs monitoring or encouragement for participation or interaction.  Problem Solving Problem solving assist level: Solves complex 90% of the  time/cues < 10% of the time  Memory Memory assist level: Recognizes or recalls 90% of the time/requires cueing < 10% of the time    Pain Pain Assessment Pain Assessment: No/denies pain Faces Pain Scale: Hurts a little bit Pain Location: Elbow Pain Orientation: Right Pain Intervention(s): Repositioned  Therapy/Group: Individual Therapy  Conard Alvira  [facility] 03/09/2017, 12:17 PM

## 2017-03-10 ENCOUNTER — Inpatient Hospital Stay (HOSPITAL_COMMUNITY): Payer: Medicare Other

## 2017-03-10 ENCOUNTER — Inpatient Hospital Stay (HOSPITAL_COMMUNITY): Payer: Medicare Other | Admitting: Occupational Therapy

## 2017-03-10 DIAGNOSIS — R8290 Unspecified abnormal findings in urine: Secondary | ICD-10-CM

## 2017-03-10 LAB — BASIC METABOLIC PANEL
Anion gap: 10 (ref 5–15)
BUN: 16 mg/dL (ref 6–20)
CO2: 27 mmol/L (ref 22–32)
Calcium: 8.6 mg/dL — ABNORMAL LOW (ref 8.9–10.3)
Chloride: 93 mmol/L — ABNORMAL LOW (ref 101–111)
Creatinine, Ser: 0.74 mg/dL (ref 0.44–1.00)
Glucose, Bld: 111 mg/dL — ABNORMAL HIGH (ref 65–99)
POTASSIUM: 3.9 mmol/L (ref 3.5–5.1)
SODIUM: 130 mmol/L — AB (ref 135–145)

## 2017-03-10 LAB — CBC WITH DIFFERENTIAL/PLATELET
Basophils Absolute: 0.1 10*3/uL (ref 0.0–0.1)
Basophils Relative: 1 %
EOS PCT: 4 %
Eosinophils Absolute: 0.4 10*3/uL (ref 0.0–0.7)
HEMATOCRIT: 34.3 % — AB (ref 36.0–46.0)
HEMOGLOBIN: 11.1 g/dL — AB (ref 12.0–15.0)
LYMPHS PCT: 14 %
Lymphs Abs: 1.2 10*3/uL (ref 0.7–4.0)
MCH: 29.9 pg (ref 26.0–34.0)
MCHC: 32.4 g/dL (ref 30.0–36.0)
MCV: 92.5 fL (ref 78.0–100.0)
Monocytes Absolute: 1.7 10*3/uL — ABNORMAL HIGH (ref 0.1–1.0)
Monocytes Relative: 19 %
NEUTROS ABS: 5.5 10*3/uL (ref 1.7–7.7)
NEUTROS PCT: 62 %
PLATELETS: 475 10*3/uL — AB (ref 150–400)
RBC: 3.71 MIL/uL — AB (ref 3.87–5.11)
RDW: 14.5 % (ref 11.5–15.5)
WBC: 8.8 10*3/uL (ref 4.0–10.5)

## 2017-03-10 LAB — URINALYSIS, ROUTINE W REFLEX MICROSCOPIC
BILIRUBIN URINE: NEGATIVE
GLUCOSE, UA: NEGATIVE mg/dL
Ketones, ur: NEGATIVE mg/dL
Nitrite: NEGATIVE
PH: 7 (ref 5.0–8.0)
Protein, ur: 100 mg/dL — AB
Specific Gravity, Urine: 1.01 (ref 1.005–1.030)

## 2017-03-10 NOTE — Progress Notes (Signed)
Physical Therapy Session Note  Patient Details  Name: Karina [name] MRN: [id number] Date of Birth: 1922/12/22  Today's Date: 03/10/2017 PT Individual Time: 1000-1030, 1615-1700  PT Individual Time Calculation (min): 30 min and 45 min   Short Term Goals: Week 2:  PT Short Term Goal 1 (Week 2): Pt will perform bed mobility with min assist PT Short Term Goal 2 (Week 2): Pt will propel w/c x 150 ft with supervision PT Short Term Goal 3 (Week 2): Pt will perform sit<>stand with mod assist  Skilled Therapeutic Interventions/Progress Updates:    Session 1: Pt seated in w/c upon PT arrival, agreeable to therapy tx and denies pain.Therapist donned pts L and R AFO and shoes total assist. Pt transported to gym in w/c total assist. Pt performed x 3 sit<>stands this session with mod assist from therapist, using L parallel bar for L UE support while holding R LE up to maintain NWB. In standing pt worked on Dietitian and strengthening: pt performed 2 x 10 hip abduction with R LE and pt performed x 10 single leg mini squats with L LE. During seated rest breaks between standing therapist performed R knee flexion stretches 2 x 30 sec and pt performed R LE 2 x 10 LAQ. Pt left seated in w/c at end of session  Session 2: Pt supine in bed upon PT arrival, agreeable to therapy tx and denies pain. Pt transferred supine>sitting with supervision using L bedrail to pull up. Pt transferred bed>w/c using slideboard and lateral scoots with min assist and verbal cues for techniques. Pt propelled w/c using L LE with min assist occasionally for steering. Pt seated in w/c in gym performed LE strengthening exercises: 2 x 10 LAQ, 2 x 10 hip abduction with TB, 2 x 10 ball squeezes, 2 x 30 sec knee flexion stretch, 2 x 30 sec hamstring stretch. Pt performed sit<>stand x 1 with L UE support, mod assist. Pt propelled w/c back to room using L LE and min assist, left seated in w/c with needs in reach and set up for dinner.     Therapy Documentation Precautions:  Precautions Precautions: Fall, Cervical Precaution Comments: ROM as tolerated right knee, NWB R Knee Required Braces or Orthoses: Other Brace/Splint Cervical Brace: Hard collar, At all times Other Brace/Splint: hinged elbow brace for the right arm  Restrictions Weight Bearing Restrictions: Yes RUE Weight Bearing: Non weight bearing RLE Weight Bearing: Non weight bearing LLE Weight Bearing: Weight bearing as tolerated Other Position/Activity Restrictions:     See Function Navigator for Current Functional Status.   Therapy/Group: Individual Therapy  Netta Corrigan, PT, DPT 03/10/2017, 12:07 PM

## 2017-03-10 NOTE — Progress Notes (Signed)
Speech Language Pathology Daily Session Note  Patient Details  Name: Karina [name] MRN: [id number] Date of Birth: [birth year]  Today's Date: 03/10/2017 SLP Individual Time: 1100-1200 SLP Individual Time Calculation (min): 60 min  Short Term Goals: Week 2: SLP Short Term Goal 1 (Week 2): Pt will recall complex, new information with mod I use of compensatory strategies. SLP Short Term Goal 2 (Week 2): Pt will complete semi-complex tasks with supervision cues for functional problem solving.   SLP Short Term Goal 3 (Week 2): Pt will selectively attend to tasks in a moderately distracting environment for 30 minutes with mod I redirection.    Skilled Therapeutic Interventions: Skilled ST services focused on cognitive skills. SLP facilitated medication management utilizing medication list and BID pill box organizer,pt demonstrated Mod I level for semi-complex problem solving. Pt demonstrated ability to monitor/correct errors during tasks and SLP instructed pt to assess medication at the end as well to ensure accuracy,pt agreed. Pt demonstrated delayed recall of short paragraph of novel information in a mildly distracting environment with a five minute delay. Pt demonstrated selective attention during functional medication management and recall tasks at Mod I level. Pt stated " I believe my mind is the same, I just need my body to catch up." SLP discussed upcoming discharge with pt and pt agreed with progress.PT was left in room with call bell within reach. Recommend to continue skilled ST services and plan for discharge.       Function:  Eating Eating                 Cognition Comprehension Comprehension assist level: Understands complex 90% of the time/cues 10% of the time  Expression   Expression assist level: Expresses complex ideas: With extra time/assistive device  Social Interaction Social Interaction assist level: Interacts appropriately with others - No medications needed.  Problem  Solving Problem solving assist level: Solves complex problems: With extra time  Memory Memory assist level: Recognizes or recalls 90% of the time/requires cueing < 10% of the time    Pain Pain Assessment Pain Assessment: No/denies pain  Therapy/Group: Individual Therapy  [name]  [facility] 03/10/2017, 4:32 PM

## 2017-03-10 NOTE — Progress Notes (Signed)
Occupational Therapy Session Note  Patient Details  Name: Karina [name] MRN: [id number] Date of Birth: [birth year]  Today's Date: 03/10/2017 OT Individual Time: 0800-0900 OT Individual Time Calculation (min): 60 min    Short Term Goals: Week 2:  OT Short Term Goal 1 (Week 2): Pt will transition from supine to sit with mod assist in preparation for selfcare tasks.  OT Short Term Goal 2 (Week 2): Pt will peform LB bathing supine to sit with mod assist.  OT Short Term Goal 3 (Week 2): Pt will complete toilet transfer with mod assist using the sliding board. OT Short Term Goal 4 (Week 2): Pt will complete toileting with max assist, lateral leans in sitting for clothing and hygiene  Skilled Therapeutic Interventions/Progress Updates:    Pt completed bathing and dressing supine to sit during session.  Max assist for washing buttocks and applying brief in supine, with pt completing rolling side to side with min assist to help out.  She was able to transition to sitting on the EOB for the rest of the selfcare.  She was able to complete UB bathing with min assist as well as UB dressing with mod assist to donn button up shirt.  Max assist for removing the right elbow brace and re-donning after bathing and dressing.  Max assist for donning all LB clothing with lateral leans side to side to pull them up over her hips.  Min assist for sliding board transfer to the wheelchair after placement.  Finished session with grooming tasks of brushing her teeth and combing her hair with setup.  Pt left in wheelchair with call button and phone in reach.    Therapy Documentation Precautions:  Precautions Precautions: Fall, Cervical Precaution Comments: ROM as tolerated right knee, NWB R Knee Required Braces or Orthoses: Other Brace/Splint Cervical Brace: Hard collar, At all times Other Brace/Splint: hinged elbow brace for the right arm  Restrictions Weight Bearing Restrictions: Yes RUE Weight Bearing: Non weight  bearing RLE Weight Bearing: Non weight bearing LLE Weight Bearing: Weight bearing as tolerated Other Position/Activity Restrictions:     Pain: Pain Assessment Pain Assessment: Faces Faces Pain Scale: Hurts a little bit Pain Type: Acute pain Pain Location: Leg Pain Orientation: Right Pain Descriptors / Indicators: Discomfort Pain Intervention(s): Repositioned ADL: See Function Navigator for Current Functional Status.   Therapy/Group: Individual Therapy  Ronold Hardgrove OTR/L 03/10/2017, 12:30 PM

## 2017-03-10 NOTE — Progress Notes (Signed)
[facility] PHYSICAL MEDICINE & REHABILITATION     PROGRESS NOTE  Subjective/Complaints:  Pt seen laying in bed this AM.  She slept well overnight.  Discussed patient's urine with nursing.  Per nursing, pt's behavior improved after discovery of hearing aid.    ROS: Denies nausea, vomiting, diarrhea, shortness of breath or chest pain   Objective: Vital Signs: Blood pressure 125/66, pulse 71, temperature 98 F (36.7 C), temperature source Oral, resp. rate 18, height 5\' 1"  (1.549 m), weight 58.1 kg (128 lb 1.4 oz), SpO2 96 %. No results found. Recent Labs    03/10/17 0540  WBC 8.8  HGB 11.1*  HCT 34.3*  PLT 475*   Recent Labs    03/10/17 0540  NA 130*  K 3.9  CL 93*  GLUCOSE 111*  BUN 16  CREATININE 0.74  CALCIUM 8.6*   CBG (last 3)  No results for input(s): GLUCAP in the last 72 hours.  Wt Readings from Last 3 Encounters:  03/08/17 58.1 kg (128 lb 1.4 oz)  02/15/17 59.9 kg (132 lb)  11/18/16 56.7 kg (125 lb)    Physical Exam:  BP 125/66 (BP Location: Left Arm)   Pulse 71   Temp 98 F (36.7 C) (Oral)   Resp 18   Ht 5\' 1"  (1.549 m)   Wt 58.1 kg (128 lb 1.4 oz)   SpO2 96%   BMI 24.20 kg/m  Constitutional: She appears well-developed and well-nourished. NAD. HENT: Normocephalic and atraumatic.  Eyes: EOM are normal. No discharge.  Neck: +C-collar in place  Cardiovascular: RRR. No JVD . Respiratory: CTA Bilaterally. Normal effort  GI: Bowel sounds are normal. Non distended. Musculoskeletal: She exhibits edema and tenderness.  Neurological: She is alert.  Motor: RUE: limited by sling, finger flexors 2/5 LUE: 5/5 proximal to distal RLE: 2-/5 proximal to distal (unchanged) LLE: HF 4-/5, KE 4/5, ADF/PF 2-/5  HOH  Skin: Skin is warm and dry. Dressings and incision c/d/i  Psychiatric: Flat.   Assessment/Plan: 1. Functional deficits secondary to polytrauma which require 3+ hours per day of interdisciplinary therapy in a comprehensive inpatient rehab  setting. Physiatrist is providing close team supervision and 24 hour management of active medical problems listed below. Physiatrist and rehab team continue to assess barriers to discharge/monitor patient progress toward functional and medical goals.  Function:  Bathing Bathing position   Position: Sitting EOB  Bathing parts Body parts bathed by patient: Right arm, Chest, Abdomen, Front perineal area, Right upper leg, Left upper leg Body parts bathed by helper: Left arm, Buttocks, Back, Left lower leg, Right lower leg  Bathing assist Assist Level: (total A)      Upper Body Dressing/Undressing Upper body dressing   What is the patient wearing?: Pull over shirt/dress     Pull over shirt/dress - Perfomed by patient: Thread/unthread left sleeve, Pull shirt over trunk Pull over shirt/dress - Perfomed by helper: Put head through opening, Thread/unthread right sleeve Button up shirt - Perfomed by patient: Thread/unthread right sleeve, Thread/unthread left sleeve, Pull shirt around back Button up shirt - Perfomed by helper: Button/unbutton shirt    Upper body assist Assist Level: 2 helpers      Lower Body Dressing/Undressing Lower body dressing   What is the patient wearing?: Pants, Non-skid slipper socks, Ted Hose     Pants- Performed by patient: Thread/unthread right pants leg, Thread/unthread left pants leg Pants- Performed by helper: Pull pants up/down   Non-skid slipper socks- Performed by helper: Don/doff right sock, Don/doff left  sock       Shoes - Performed by helper: Don/doff left shoe AFO - Performed by patient: Don/doff left AFO     TED Hose - Performed by helper: Don/doff right TED hose, Don/doff left TED hose  Lower body assist Assist for lower body dressing: 2 Helpers      Toileting Toileting Toileting activity did not occur: Safety/medical concerns   Toileting steps completed by helper: Adjust clothing prior to toileting, Performs perineal hygiene, Adjust  clothing after toileting Toileting Assistive Devices: Other (comment)(purwick catheter in place)  Toileting assist Assist level: Two helpers   Transfers Chair/bed transfer Chair/bed transfer activity did not occur: N/A Chair/bed transfer method: Lateral scoot Chair/bed transfer assist level: Touching or steadying assistance (Pt > 75%) Chair/bed transfer assistive device: Sliding board, Armrests Mechanical lift: Maximove   Locomotion Ambulation Ambulation activity did not occur: Safety/medical concerns(NWB R LE/R UE)         Wheelchair Wheelchair activity did not occur: Refused(pt requesting to stay in bed secondary to painful disimpaction prior to therapy ) Type: Manual Max wheelchair distance: 50' Assist Level: Moderate assistance (Pt 50 - 74%)  Cognition Comprehension Comprehension assist level: Follows basic conversation/direction with no assist  Expression Expression assist level: Expresses basic needs/ideas: With extra time/assistive device  Social Interaction Social Interaction assist level: Interacts appropriately 90% of the time - Needs monitoring or encouragement for participation or interaction.  Problem Solving Problem solving assist level: Solves complex 90% of the time/cues < 10% of the time  Memory Memory assist level: Recognizes or recalls 90% of the time/requires cueing < 10% of the time    Medical Problem List and Plan: 1.  Decreased functional mobility secondary to nondisplaced type II odontoid fracture/nondisplaced C2 fracture and right C4-cervical collar at all times. Right distal femur fracture/patella fracture with ORIF and closed treatment of patella fracture 02/16/2017 as well as right supracondylar humerus fracture with hinged elbow brace applied.    Cont CIR therapies   Xrays reviewed, per Ortho cont NWB RUE/RLE, WBAT LLE 2.  DVT Prophylaxis/Anticoagulation: Eliquis 3. Pain Management: Lidoderm patch, OxyContin sustained release 10 mg twice a day, oxycodone  as needed 4. Mood: Celexa 10 mg daily   Team to continue to provide ego support and positive reinforcement as possible   Reactive depression at present,  does not appear to have actual SI at at present   Appreciate Neuropsych eval 5. Neuropsych: This patient is ?fully capable of making decisions on her own behalf. 6. Skin/Wound Care: Routine skin checks 7. Fluids/Electrolytes/Nutrition: Routine I&O's 8. Acute blood loss anemia.    Hb 11.1 on 12/4  Cont to monitor 9. CAD/PAF/pacemaker 2015. Follow-up cardiology services needed. Patient is [facility] at [facility]. Cardiac exam 120 mg twice a day, clonidine 0.1 mg daily and 0.2 mg daily at bedtime, Lopressor 12.5 mg twice a day. Cardiac rate controlled 10. Hyperlipidemia. Crestor 11. GERD. Protonix 12. Hyponatremia   Na+ 130 on 12/4, stable   Cont to monitor 13. Hypoalbuminemia   Supplement initiated 11/20 14. Leukocytosis: Resolved   WBCs 8.8 on 12/4    Cont to monitor   Afebrile 15. HTN   HCTZ d/ced on 11/27 due to abnormal electrolytes   Cont to monitor   Controlled on 12/4 16. Bladder incontinence   Improved with hearing aid   Strong cognitive component  17. Hypokalemia: Resolved   Supplemented 18. Foul urine   UA/Ucx ordered    LOS (Days) 15 A FACE TO FACE EVALUATION WAS  PERFORMED  Chesley Valls Lorie Phenix 03/10/2017 8:47 AM

## 2017-03-11 ENCOUNTER — Inpatient Hospital Stay (HOSPITAL_COMMUNITY): Payer: Medicare Other

## 2017-03-11 ENCOUNTER — Inpatient Hospital Stay (HOSPITAL_COMMUNITY): Payer: Medicare Other | Admitting: Occupational Therapy

## 2017-03-11 ENCOUNTER — Encounter (HOSPITAL_COMMUNITY): Payer: Medicare Other | Admitting: Psychology

## 2017-03-11 ENCOUNTER — Inpatient Hospital Stay (HOSPITAL_COMMUNITY): Payer: Medicare Other | Admitting: Speech Pathology

## 2017-03-11 LAB — URINE CULTURE

## 2017-03-11 MED ORDER — NITROFURANTOIN MONOHYD MACRO 100 MG PO CAPS
100.0000 mg | ORAL_CAPSULE | Freq: Two times a day (BID) | ORAL | Status: AC
  Administered 2017-03-11 – 2017-03-17 (×14): 100 mg via ORAL
  Filled 2017-03-11 (×14): qty 1

## 2017-03-11 NOTE — Progress Notes (Signed)
Speech Language Pathology Discharge Summary  Patient Details  Name: Karina [name] MRN: [id number] Date of Birth: 07-06-1922  Today's Date: 03/11/2017 SLP Individual Time: 0905-1000 SLP Individual Time Calculation (min): 55 min   Skilled Therapeutic Interventions:  Pt was seen for skilled ST targeting cognitive goals.  SLP re-administered [facility] standardized assessment to measure progress from initial evaluation.  Pt scored 20/22 (n>/=18) and reports that she feels back to baseline for mentation.  As a result, recommend that pt be discharged from [facility] services.  Pt was returned to room and left in wheelchair with call bell within reach.       Patient has met 4 of 4 long term goals.  Patient to discharge at overall Modified Independent level.  Reasons goals not met:     Clinical Impression/Discharge Summary:  Pt has made functional gains and is discharging from [facility] having met 4 out of 4 long term goals.  Pt is currently mod I for semi-complex tasks and is back to baseline for mentation due to improved attention to task and subsequent progress in problem solving and recall.  Education is complete.  No further ST needs are indicated.    Care Partner:  Caregiver Able to Provide Assistance: Other (comment)(n/a)  Type of Caregiver Assistance: (n/a)  Recommendation:  None      Equipment: none recommended   Reasons for discharge: Treatment goals met   Patient/Family Agrees with Progress Made and Goals Achieved: Yes   Function:  Eating Eating                 Cognition Comprehension Comprehension assist level: Follows complex conversation/direction with extra time/assistive device  Expression   Expression assist level: Expresses complex ideas: With extra time/assistive device  Social Interaction Social Interaction assist level: Interacts appropriately with others - No medications needed.  Problem Solving Problem solving assist level: Solves complex problems: With extra time  Memory  Memory assist level: More than reasonable amount of time   Emilio Math 03/11/2017, 12:44 PM

## 2017-03-11 NOTE — Progress Notes (Signed)
   03/11/17 1100  Clinical Encounter Type  Visited With Patient  Visit Type Follow-up  Stress Factors  Patient Stress Factors (wondering what another rehab facility will be like)   Followed up on a visit from last Thursday.  Patient was up, dressed and looking so much better.  She is pleased she is doing well, but some concern about next steps in terms of where she will go and will she get the kind of care she believe she needs.  Her outlook was very positive and 4 of her close friends came to visit and she just lite up with joy.  Will follow as needed. Chaplain Katherene Ponto

## 2017-03-11 NOTE — Progress Notes (Signed)
Occupational Therapy Session Note  Patient Details  Name: Karina [name] MRN: [id number] Date of Birth: 1922/09/19  Today's Date: 03/11/2017 OT Individual Time: 6811-5726 OT Individual Time Calculation (min): 59 min    Short Term Goals: Week 2:  OT Short Term Goal 1 (Week 2): Pt will transition from supine to sit with mod assist in preparation for selfcare tasks.  OT Short Term Goal 2 (Week 2): Pt will peform LB bathing supine to sit with mod assist.  OT Short Term Goal 3 (Week 2): Pt will complete toilet transfer with mod assist using the sliding board. OT Short Term Goal 4 (Week 2): Pt will complete toileting with max assist, lateral leans in sitting for clothing and hygiene  Skilled Therapeutic Interventions/Progress Updates:    Pt completed bathing and dressing during session.  Max assist for washing buttocks and donning brief in supine, with min assist for rolling to the left and mod assist to the right.  Min assist for transition to sitting on EOB with mod assist for scooting forward.  She was able to complete washing UB with min assist and then donned button up shirt with mod assist following correct technique to limit RUE movement.  She was able to wash her upper legs, but needed assist with washing the lower legs and feet.  She used the reacher and min assist from therapist to thread her pants over her feet, with max assist to complete lateral leans and pull them up over her hips.  Finished session with mod assist sliding board to the wheelchair.  Pt left with call button in reach and nursing in room to administer meds.    Therapy Documentation Precautions:  Precautions Precautions: Fall, Cervical Precaution Comments: ROM as tolerated right knee, NWB R Knee Required Braces or Orthoses: Other Brace/Splint Cervical Brace: Hard collar, At all times Other Brace/Splint: hinged elbow brace for the right arm  Restrictions Weight Bearing Restrictions: Yes RUE Weight Bearing: Non weight  bearing RLE Weight Bearing: Non weight bearing LLE Weight Bearing: Weight bearing as tolerated Other Position/Activity Restrictions:    Pain: Pain Assessment Pain Assessment: No/denies pain ADL: See Function Navigator for Current Functional Status.   Therapy/Group: Individual Therapy  Toniya Rozar OTR/L 03/11/2017, 12:19 PM
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
## 2017-03-11 NOTE — Progress Notes (Addendum)
Physical Therapy Session Note  Patient Details  Name: Adrean Findlay MRN: [id number] Date of Birth: 08/08/1922  Today's Date: 03/11/2017 PT Individual Time: 1003-1100, 1540-1620 PT Individual Time Calculation (min): 57 min , 40 min  Short Term Goals: Week 2:  PT Short Term Goal 1 (Week 2): Pt will perform bed mobility with min assist PT Short Term Goal 2 (Week 2): Pt will propel w/c x 150 ft with supervision PT Short Term Goal 3 (Week 2): Pt will perform sit<>stand with mod assist  Skilled Therapeutic Interventions/Progress Updates:   Session 1:  Pt seated in w/c upon PT arrival, agreeable to therapy tx and denies pain.Therapist donned pts L and R AFO and shoes total assist. Pt propelled w/c x 100 ft to gym with min assist using only L LE. Pt seated in w/c in gym performed LE strengthening exercises: 2 x 10 LAQ, 2 x 10 hip abduction with TB, 2 x 10 ball squeezes, 2 x 30 sec knee flexion stretch, 2 x 30 sec hamstring stretch.  Pt performed x 3 sit<>stands this session with mod assist from therapist, using L parallel bar for L UE support while holding R LE up to maintain NWB. In standing pt worked on Dietitian and strengthening: pt performed 2 x 10 hip abduction with R LE and pt performed x 10 single leg mini squats with L LE. During seated rest breaks between standing therapist performed R knee flexion stretches 2 x 30 sec and pt performed R LE 2 x 10 LAQ. Pt propelled w/c back to room using L LE and min assist. Pt left seated in w/c at end of session.   Session 2: Pt supine in bed upon PT arrival, agreeable to therapy tx and denies pain. Pt transferred supine>sitting EOB with supervision using bedrails. Pt transferred from bed>w/c using slideboard with min assist, verbal cues for techniques. Pt propelled w/c from room>gym using L LE and with supervision. Pt transferred from w/c<>mat x 2 using slideboard and min assist. Pt worked on dynamic seated balance with only L LE support in order to hit  beach ball back and forth. Also worked on dynamic seated balance while throwing horseshoes x 3 trials. Pt left seated in w/c at end of session with needs in reach.      Therapy Documentation Precautions:  Precautions Precautions: Fall, Cervical Precaution Comments: ROM as tolerated right knee, NWB R Knee Required Braces or Orthoses: Other Brace/Splint Cervical Brace: Hard collar, At all times Other Brace/Splint: hinged elbow brace for the right arm  Restrictions Weight Bearing Restrictions: Yes RUE Weight Bearing: Non weight bearing RLE Weight Bearing: Non weight bearing LLE Weight Bearing: Weight bearing as tolerated Other Position/Activity Restrictions:     See Function Navigator for Current Functional Status.   Therapy/Group: Individual Therapy  Netta Corrigan, PT, DPT 03/11/2017, 10:35 AM

## 2017-03-12 ENCOUNTER — Inpatient Hospital Stay (HOSPITAL_COMMUNITY): Payer: Medicare Other | Admitting: Occupational Therapy

## 2017-03-12 ENCOUNTER — Inpatient Hospital Stay (HOSPITAL_COMMUNITY): Payer: Medicare Other | Admitting: Physical Therapy

## 2017-03-12 ENCOUNTER — Inpatient Hospital Stay (HOSPITAL_COMMUNITY): Payer: Medicare Other

## 2017-03-12 MED ORDER — CITALOPRAM HYDROBROMIDE 20 MG PO TABS
20.0000 mg | ORAL_TABLET | Freq: Every day | ORAL | Status: DC
  Administered 2017-03-13 – 2017-03-19 (×7): 20 mg via ORAL
  Filled 2017-03-12 (×7): qty 1

## 2017-03-12 NOTE — Progress Notes (Signed)
Physical Therapy Session Note  Patient Details  Name: Karina [name] MRN: [id number] Date of Birth: 12-25-1922  Today's Date: 03/12/2017 PT Individual Time: 0815-0900 AND 1400-1500 PT Individual Time Calculation (min): 45 min AND 60 min   Short Term Goals: Week 2:  PT Short Term Goal 1 (Week 2): Pt will perform bed mobility with min assist PT Short Term Goal 2 (Week 2): Pt will propel w/c x 150 ft with supervision PT Short Term Goal 3 (Week 2): Pt will perform sit<>stand with mod assist  Skilled Therapeutic Interventions/Progress Updates:   Session 1:  Pt supine upon arrival and agreeable to therapy, no c/o pain. RN present and reporting pt as UTI and pt reports she isn't feeling as well today. Transferred to EOB w/ Mod assist for LE management and trunk support. Transferred to recliner via slide board transfer to R side w/ Min assist. Transfers required increased time to perform 2/2 fatigue this session. Worked on tolerance to OOB activity and work on trunk control w/o back support. Sat in recliner to eat breakfast w/ set-up assist only and maintained intermittent static sitting to eat w/o back support, however pt w/ decreased trunk control this session and required multiple breaks to lean back on recliner. Ended session in recliner w/ feet propped, call bell within reach and all needs met.   Session 2:  Pt supine upon arrival and agreeable to therapy, no c/o pain, states she is feeling much better than this morning. Focused on independence w/ functional activity this session. Transferred to EOB w/ supervision and use of raising HOB function. Maintained static sitting w/ supervision for 10+ minutes while eating rest of lunch, set-up assist for lunch items. Transferred to w/c towards R side w/ via slide board transfer w/ min assist. Pt self-propelled w/c to/from gym w/ supervision using L hemi technique and occasional cues for turning technique. Performed sit<>stand x3 w/ UE support on L quad  cane in stance. Manual assist for weight bearing and RLE management to prevent WB, mod assist overall. Pt tolerate 20-30 sec of standing at a time. Returned to room in w/c and ended session in w/c, call bell within reach and all needs met.   Therapy Documentation Precautions:  Precautions Precautions: Fall, Cervical Precaution Comments: ROM as tolerated right knee, NWB R Knee Required Braces or Orthoses: Other Brace/Splint Cervical Brace: Hard collar, At all times Other Brace/Splint: hinged elbow brace for the right arm  Restrictions Weight Bearing Restrictions: Yes RUE Weight Bearing: Non weight bearing RLE Weight Bearing: Non weight bearing LLE Weight Bearing: Weight bearing as tolerated Other Position/Activity Restrictions:    See Function Navigator for Current Functional Status.   Therapy/Group: Individual Therapy  [name] 03/12/2017, 3:36 PM

## 2017-03-12 NOTE — Progress Notes (Signed)
[facility] PHYSICAL MEDICINE & REHABILITATION     PROGRESS NOTE  Subjective/Complaints:  Patient seen lying in bed this morning. She states she slept fairly overnight. She states she feels "terrible". She states that she does not feel that she is ready to leave this place anytime soon. She has questions about her sutures. Her c-collar is ill-fitting.  ROS: Denies nausea, vomiting, diarrhea, shortness of breath or chest pain   Objective: Vital Signs: Blood pressure 123/68, pulse 75, temperature 98.4 F (36.9 C), temperature source Oral, resp. rate 18, height 5\' 1"  (1.549 m), weight 61 kg (134 lb 7.7 oz), SpO2 96 %. No results found. Recent Labs    03/10/17 0540  WBC 8.8  HGB 11.1*  HCT 34.3*  PLT 475*   Recent Labs    03/10/17 0540  NA 130*  K 3.9  CL 93*  GLUCOSE 111*  BUN 16  CREATININE 0.74  CALCIUM 8.6*   CBG (last 3)  No results for input(s): GLUCAP in the last 72 hours.  Wt Readings from Last 3 Encounters:  03/12/17 61 kg (134 lb 7.7 oz)  02/15/17 59.9 kg (132 lb)  11/18/16 56.7 kg (125 lb)    Physical Exam:  BP 123/68 (BP Location: Left Arm)   Pulse 75   Temp 98.4 F (36.9 C) (Oral)   Resp 18   Ht 5\' 1"  (1.549 m)   Wt 61 kg (134 lb 7.7 oz)   SpO2 96%   BMI 25.41 kg/m  Constitutional: She appears well-developed and well-nourished. NAD. HENT: Normocephalic and atraumatic.  Eyes: EOM are normal. No discharge.  Neck: +C-collar in place  Cardiovascular: RRR. No JVD . Respiratory: CTA Bilaterally. Normal effort  GI: Bowel sounds are normal. Non distended. Musculoskeletal: She exhibits edema and tenderness.  Neurological: She is alert.  Motor: RUE: limited by sling, finger flexors 2/5 LUE: 5/5 proximal to distal RLE: 3/5 HF, KE, 2-/5 ADF/PF LLE: HF 4/5, KE 4+/5, ADF/PF 2-/5 (improving) HOH  Skin: Skin is warm and dry. Dressings and incision c/d/i  Psychiatric: Flat.   Assessment/Plan: 1. Functional deficits secondary to polytrauma which require 3+  hours per day of interdisciplinary therapy in a comprehensive inpatient rehab setting. Physiatrist is providing close team supervision and 24 hour management of active medical problems listed below. Physiatrist and rehab team continue to assess barriers to discharge/monitor patient progress toward functional and medical goals.  Function:  Bathing Bathing position   Position: Sitting EOB  Bathing parts Body parts bathed by patient: Right arm, Chest, Abdomen, Front perineal area, Right upper leg, Left upper leg Body parts bathed by helper: Left arm, Buttocks, Back, Left lower leg, Right lower leg  Bathing assist Assist Level: (total A)      Upper Body Dressing/Undressing Upper body dressing   What is the patient wearing?: Button up shirt     Pull over shirt/dress - Perfomed by patient: Thread/unthread left sleeve, Pull shirt over trunk Pull over shirt/dress - Perfomed by helper: Put head through opening, Thread/unthread right sleeve Button up shirt - Perfomed by patient: Thread/unthread right sleeve, Thread/unthread left sleeve Button up shirt - Perfomed by helper: Pull shirt around back, Button/unbutton shirt    Upper body assist Assist Level: 2 helpers      Lower Body Dressing/Undressing Lower body dressing   What is the patient wearing?: Pants, Non-skid slipper socks, Ted Hose     Pants- Performed by patient: Thread/unthread right pants leg, Thread/unthread left pants leg Pants- Performed by helper: Thread/unthread right  pants leg, Thread/unthread left pants leg, Pull pants up/down   Non-skid slipper socks- Performed by helper: Don/doff right sock, Don/doff left sock       Shoes - Performed by helper: Don/doff left shoe AFO - Performed by patient: Don/doff left AFO     TED Hose - Performed by helper: Don/doff right TED hose, Don/doff left TED hose  Lower body assist Assist for lower body dressing: 2 Helpers      Toileting Toileting Toileting activity did not occur:  Safety/medical concerns   Toileting steps completed by helper: Adjust clothing prior to toileting, Performs perineal hygiene, Adjust clothing after toileting Toileting Assistive Devices: Other (comment)(purwick catheter in place)  Toileting assist Assist level: Two helpers   Transfers Chair/bed transfer Chair/bed transfer activity did not occur: N/A Chair/bed transfer method: Lateral scoot Chair/bed transfer assist level: Touching or steadying assistance (Pt > 75%) Chair/bed transfer assistive device: Sliding board, Armrests Mechanical lift: Maximove   Locomotion Ambulation Ambulation activity did not occur: Safety/medical concerns(NWB R LE/R UE)         Wheelchair Wheelchair activity did not occur: Refused(pt requesting to stay in bed secondary to painful disimpaction prior to therapy ) Type: Manual Max wheelchair distance: 100 ft(using L LE only) Assist Level: Touching or steadying assistance (Pt > 75%)  Cognition Comprehension Comprehension assist level: Follows complex conversation/direction with extra time/assistive device  Expression Expression assist level: Expresses complex ideas: With extra time/assistive device  Social Interaction Social Interaction assist level: Interacts appropriately with others - No medications needed.  Problem Solving Problem solving assist level: Solves complex problems: With extra time  Memory Memory assist level: More than reasonable amount of time    Medical Problem List and Plan: 1.  Decreased functional mobility secondary to nondisplaced type II odontoid fracture/nondisplaced C2 fracture and right C4-cervical collar at all times. Right distal femur fracture/patella fracture with ORIF and closed treatment of patella fracture 02/16/2017 as well as right supracondylar humerus fracture with hinged elbow brace applied.    Cont CIR    Xrays reviewed, per Ortho cont NWB RUE/RLE, WBAT LLE 2.  DVT Prophylaxis/Anticoagulation: Eliquis 3. Pain  Management: Lidoderm patch, OxyContin sustained release 10 mg twice a day, oxycodone as needed 4. Mood: Celexa 10 mg daily, increased on 12/6   Team to continue to provide ego support and positive reinforcement as possible   Reactive depression   Appreciate Neuropsych eval 5. Neuropsych: This patient is ?fully capable of making decisions on her own behalf. 6. Skin/Wound Care: Routine skin checks 7. Fluids/Electrolytes/Nutrition: Routine I&O's 8. Acute blood loss anemia.    Hb 11.1 on 12/4   Labs ordered for tomorrow  Cont to monitor 9. CAD/PAF/pacemaker 2015. Follow-up cardiology services needed. Patient is followed at [facility]. Cardiac exam 120 mg twice a day, clonidine 0.1 mg daily and 0.2 mg daily at bedtime, Lopressor 12.5 mg twice a day. Cardiac rate controlled 10. Hyperlipidemia. Crestor 11. GERD. Protonix 12. Hyponatremia   Na+ 130 on 12/4, stable   Labs ordered for tomorrow   Cont to monitor 13. Hypoalbuminemia   Supplement initiated 11/20 14. Leukocytosis: Resolved   WBCs 8.8 on 12/4   Labs ordered for tomorrow    Cont to monitor   Afebrile 15. HTN   HCTZ d/ced on 11/27 due to abnormal electrolytes   Cont to monitor   Controlled on 12/6 16. Bladder incontinence   Improved with hearing aid   Strong cognitive component  17. Hypokalemia: Resolved   Supplemented 18. Foul  urine   UA+, Ucx with multiple species, reordered, pending   Empiric Macrobid started on 12/5-12/11    LOS (Days) 17 A FACE TO FACE EVALUATION WAS PERFORMED  Sarayu Prevost Lorie Phenix 03/12/2017 12:49 PM

## 2017-03-12 NOTE — Progress Notes (Signed)
Physical Therapy Session Note  Patient Details  Name: Karina [name] MRN: [id number] Date of Birth: 1922-12-02  Today's Date: 03/12/2017 PT Individual Time: 1300-1330 PT Individual Time Calculation (min): 30 min   Short Term Goals: Week 2:  PT Short Term Goal 1 (Week 2): Pt will perform bed mobility with min assist PT Short Term Goal 2 (Week 2): Pt will propel w/c x 150 ft with supervision PT Short Term Goal 3 (Week 2): Pt will perform sit<>stand with mod assist  Skilled Therapeutic Interventions/Progress Updates:    Session focused on RLE therex for functional strengthening to aid with mobility and education in regards to goals, progress, and continued rehab upon d/c per pt questions. Pt reports overall not feeling well due to +UTI and that she didn't realize it could have such an effect on people. Pt agreeable for OOB in next PT session at 2 but request to stay in bed and do therex. Instructed in supine RLE heel slides, quad sets, SLR and hip abduction/adduction x 20 reps each - some movements initially active assisted and pt able to progress ROM and muscle activation. Education on edema control and positioning as well. PT donned Tedhose in preparation for next therapy session. Pt remains very motivated to improve and return to PLOF.  Therapy Documentation Precautions:  Precautions Precautions: Fall, Cervical Precaution Comments: ROM as tolerated right knee, NWB R Knee Required Braces or Orthoses: Other Brace/Splint Cervical Brace: Hard collar, At all times Other Brace/Splint: hinged elbow brace for the right arm  Restrictions Weight Bearing Restrictions: Yes RUE Weight Bearing: Non weight bearing RLE Weight Bearing: Non weight bearing LLE Weight Bearing: Weight bearing as tolerated Other Position/Activity Restrictions:     Pain:  Reports general fatigue and not feeling well from +UTI. Reports soreness in RLE - rest breaks given as needed.    See Function Navigator for Current  Functional Status.   Therapy/Group: Individual Therapy  Canary Brim Ivory Broad, PT, DPT  03/12/2017, 2:28 PM

## 2017-03-12 NOTE — Progress Notes (Signed)
Occupational Therapy Session Note  Patient Details  Name: Karina [name] MRN: [id number] Date of Birth: [birth year]  Today's Date: 03/12/2017 OT Individual Time: 1100-1200 OT Individual Time Calculation (min): 60 min    Short Term Goals: Week 2:  OT Short Term Goal 1 (Week 2): Pt will transition from supine to sit with mod assist in preparation for selfcare tasks.  OT Short Term Goal 2 (Week 2): Pt will peform LB bathing supine to sit with mod assist.  OT Short Term Goal 3 (Week 2): Pt will complete toilet transfer with mod assist using the sliding board. OT Short Term Goal 4 (Week 2): Pt will complete toileting with max assist, lateral leans in sitting for clothing and hygiene  Skilled Therapeutic Interventions/Progress Updates:    Pt received sitting up in recliner, agreeable to OT treatment session. Pt reports she has already had a brief change/donned clean pants this AM, agreeable to UB bathing/dressing. Pt completes slide board transfer drop arm recliner>EOB with MinA. Pt sits EOB for UB bathe/dress. MaxA for UB dressing and ModA for UB bathing with increased time to complete. Pt completes grooming ADLs with setup/supervision assist. Pt returned to supine in bed with ModA for LE management, Pt left supine in bed, call bell and needs within reach.   Therapy Documentation Precautions:  Precautions Precautions: Fall, Cervical Precaution Comments: ROM as tolerated right knee, NWB R Knee Required Braces or Orthoses: Other Brace/Splint Cervical Brace: Hard collar, At all times Other Brace/Splint: hinged elbow brace for the right arm  Restrictions Weight Bearing Restrictions: Yes RUE Weight Bearing: Non weight bearing RLE Weight Bearing: Non weight bearing LLE Weight Bearing: Weight bearing as tolerated Other Position/Activity Restrictions:      See Function Navigator for Current Functional Status.   Therapy/Group: Individual Therapy  Raymondo Band 03/12/2017, 4:46 PM

## 2017-03-13 ENCOUNTER — Inpatient Hospital Stay (HOSPITAL_COMMUNITY): Payer: Medicare Other | Admitting: Physical Therapy

## 2017-03-13 ENCOUNTER — Inpatient Hospital Stay (HOSPITAL_COMMUNITY): Payer: Medicare Other

## 2017-03-13 ENCOUNTER — Inpatient Hospital Stay (HOSPITAL_COMMUNITY): Payer: Medicare Other | Admitting: Occupational Therapy

## 2017-03-13 DIAGNOSIS — N390 Urinary tract infection, site not specified: Secondary | ICD-10-CM | POA: Diagnosis present

## 2017-03-13 LAB — CBC WITH DIFFERENTIAL/PLATELET
BASOS ABS: 0.1 10*3/uL (ref 0.0–0.1)
Basophils Relative: 1 %
EOS ABS: 0.6 10*3/uL (ref 0.0–0.7)
EOS PCT: 8 %
HCT: 35 % — ABNORMAL LOW (ref 36.0–46.0)
Hemoglobin: 10.9 g/dL — ABNORMAL LOW (ref 12.0–15.0)
LYMPHS PCT: 15 %
Lymphs Abs: 1.2 10*3/uL (ref 0.7–4.0)
MCH: 29.2 pg (ref 26.0–34.0)
MCHC: 31.1 g/dL (ref 30.0–36.0)
MCV: 93.8 fL (ref 78.0–100.0)
MONO ABS: 1.2 10*3/uL — AB (ref 0.1–1.0)
Monocytes Relative: 16 %
Neutro Abs: 4.8 10*3/uL (ref 1.7–7.7)
Neutrophils Relative %: 60 %
PLATELETS: 444 10*3/uL — AB (ref 150–400)
RBC: 3.73 MIL/uL — ABNORMAL LOW (ref 3.87–5.11)
RDW: 14.7 % (ref 11.5–15.5)
WBC: 8 10*3/uL (ref 4.0–10.5)

## 2017-03-13 LAB — BASIC METABOLIC PANEL
Anion gap: 9 (ref 5–15)
BUN: 17 mg/dL (ref 6–20)
CALCIUM: 8.9 mg/dL (ref 8.9–10.3)
CO2: 27 mmol/L (ref 22–32)
CREATININE: 0.76 mg/dL (ref 0.44–1.00)
Chloride: 98 mmol/L — ABNORMAL LOW (ref 101–111)
GFR calc Af Amer: >60 mL/min (ref >60)
GLUCOSE: 105 mg/dL — AB (ref 65–99)
POTASSIUM: 3.9 mmol/L (ref 3.5–5.1)
SODIUM: 134 mmol/L — AB (ref 135–145)

## 2017-03-13 LAB — URINE CULTURE

## 2017-03-13 NOTE — Progress Notes (Signed)
Occupational Therapy Weekly Progress Note  Patient Details  Name: Kedra Mcglade MRN: [id number] Date of Birth: 1922/04/25  Beginning of progress report period: March 05, 2017 End of progress report period: March 13, 2017  Today's Date: 03/13/2017 OT Individual Time: 8938-1017 OT Individual Time Calculation (min): 53 min    Patient has met 4 of 5 short term goals.  Mrs. Firkus is making steady progress with OT at this time.  She is performing UB bathing at min assist level sitting unsupported with UB dressing at min to mod assist, depending on the type and size of the shirt she is wearing.  She needs max assist overall for LB bathing and dressing supine to sit.  Have begun using AE for doffing socks and donning pants over feet, but she still needs max assist for pulling them over her hips with lateral leans.  Sliding board transfers are at a mod assist as well to the drop arm commode with total assist for clothing and hygiene in sitting.  Pt still has to follow NWBing status in the RUE and RLE.  Feel up to this time she has done well but is now beginning to plateau, until weightbearing status can be changed.  Will continued with CIR level therapy and anticipated discharge to SNF once bed is available.    Patient continues to demonstrate the following deficits: muscle weakness and decreased balance strategies and difficulty maintaining precautions and therefore will continue to benefit from skilled OT intervention to enhance overall performance with BADL.  Patient progressing toward long term goals..  Continue plan of care.  OT Short Term Goals Week 3:  OT Short Term Goal 1 (Week 3): Continue working on [employer].    Skilled Therapeutic Interventions/Progress Updates:    Pt completed bathing supine to sit EOB.  Max assist for peri washing and donning new brief in supine, with min assist for rolling side to side.  She was able to transition to sitting with mod assist from supine  to finish working on selfcare tasks.  Min assist for UB bathing with mod assist to donn pullover shirt.  She needed max assist to donn all LB clothing this session as well secondary to time and needing to take medications.  Min assist for sliding board transfer to the wheelchair with mod instructional cueing for technique.  Pt finished grooming tasks in sitting at the wheelchair with setup.  Pt finished session at bedside with call button and phone in reach.  Requested NT to assist with heating up breakfast for pt.    Therapy Documentation Precautions:  Precautions Precautions: Fall, Cervical Precaution Comments: ROM as tolerated right knee, NWB R Knee Required Braces or Orthoses: Other Brace/Splint Cervical Brace: Hard collar, At all times Other Brace/Splint: hinged elbow brace for the right arm  Restrictions Weight Bearing Restrictions: Yes RUE Weight Bearing: Non weight bearing RLE Weight Bearing: Non weight bearing LLE Weight Bearing: Weight bearing as tolerated Other Position/Activity Restrictions:     Pain: Pain Assessment Pain Assessment: No/denies pain Pain Score: 0-No pain ADL: See Function Navigator for Current Functional Status.   Therapy/Group: Individual Therapy  Nazariah Cadet OTR/L 03/13/2017, 12:16 PM

## 2017-03-13 NOTE — Progress Notes (Signed)
Social Work Patient ID: Karina [name], female   DOB: [birth year], 83 y.o.   MRN: [id number]   CSW met with pt 03-11-17 after team conference to update her on discussion and several other times this week to talk about d/c planning.  CSW spoke with pt's son via telephone several times this week, as well.  Pt/son decided that SNF made more sense for where pt is in her rehabilitation and CSW began this process.  CSW had received bed offer and pt/son were about to accept this offer, when the facility called and stated that they could no longer offer the pt bed.  Pt/son made aware of these findings and SNF search was resumed and expanded to include [facility] facilities closer to pt's home.  This is per son's request after he spoke with Lennart Pall, CSW, today via telephone to offer that option.  CSW will continue to follow pt and work toward SNF tx next week.

## 2017-03-13 NOTE — Progress Notes (Signed)
[name] PHYSICAL MEDICINE & REHABILITATION     PROGRESS NOTE  Subjective/Complaints:  Patient seen lying in bed this morning. She states she slept well overnight. She states she feels better after receiving antibiotics yesterday.  ROS: Denies nausea, vomiting, diarrhea, shortness of breath or chest pain   Objective: Vital Signs: Blood pressure 134/79, pulse 78, temperature 97.7 F (36.5 C), temperature source Oral, resp. rate 12, height 5\' 1"  (1.549 m), weight 61 kg (134 lb 7.7 oz), SpO2 99 %. No results found. Recent Labs    03/13/17 0420  WBC 8.0  HGB 10.9*  HCT 35.0*  PLT 444*   Recent Labs    03/13/17 0420  NA 134*  K 3.9  CL 98*  GLUCOSE 105*  BUN 17  CREATININE 0.76  CALCIUM 8.9   CBG (last 3)  No results for input(s): GLUCAP in the last 72 hours.  Wt Readings from Last 3 Encounters:  03/12/17 61 kg (134 lb 7.7 oz)  02/15/17 59.9 kg (132 lb)  11/18/16 56.7 kg (125 lb)    Physical Exam:  BP 134/79 (BP Location: Left Arm)   Pulse 78   Temp 97.7 F (36.5 C) (Oral)   Resp 12   Ht 5\' 1"  (1.549 m)   Wt 61 kg (134 lb 7.7 oz)   SpO2 99%   BMI 25.41 kg/m  Constitutional: She appears well-developed and well-nourished. NAD. HENT: Normocephalic and atraumatic.  Eyes: EOM are normal. No discharge.  Neck: +C-collar in place  Cardiovascular: RRRR. No JVD . Respiratory: CTA Bilaterally. Normal effort  GI: Bowel sounds are normal. Non distended. Musculoskeletal: She exhibits edema and tenderness.  Neurological: She is alert.  Motor: RUE: limited by sling, finger flexors 2/5 LUE: 5/5 proximal to distal RLE: 3/5 HF, KE, 2-/5 ADF/PF LLE: HF 4/5, KE 4+/5, ADF/PF 2-/5 (stable) HOH  Skin: Skin is warm and dry. Dressings and incision c/d/i  Psychiatric: Flat.   Assessment/Plan: 1. Functional deficits secondary to polytrauma which require 3+ hours per day of interdisciplinary therapy in a comprehensive inpatient rehab setting. Physiatrist is providing close  team supervision and 24 hour management of active medical problems listed below. Physiatrist and rehab team continue to assess barriers to discharge/monitor patient progress toward functional and medical goals.  Function:  Bathing Bathing position   Position: Sitting EOB  Bathing parts Body parts bathed by patient: Right arm, Chest, Abdomen Body parts bathed by helper: Left arm, Back  Bathing assist Assist Level: (total A)      Upper Body Dressing/Undressing Upper body dressing   What is the patient wearing?: Pull over shirt/dress     Pull over shirt/dress - Perfomed by patient: Put head through opening, Pull shirt over trunk Pull over shirt/dress - Perfomed by helper: Thread/unthread right sleeve, Thread/unthread left sleeve Button up shirt - Perfomed by patient: Thread/unthread right sleeve, Thread/unthread left sleeve Button up shirt - Perfomed by helper: Pull shirt around back, Button/unbutton shirt    Upper body assist Assist Level: 2 helpers      Lower Body Dressing/Undressing Lower body dressing   What is the patient wearing?: Pants, Non-skid slipper socks, Ted Hose     Pants- Performed by patient: Thread/unthread right pants leg, Thread/unthread left pants leg Pants- Performed by helper: Thread/unthread right pants leg, Thread/unthread left pants leg, Pull pants up/down   Non-skid slipper socks- Performed by helper: Don/doff right sock, Don/doff left sock       Shoes - Performed by helper: Don/doff left shoe AFO -  Performed by patient: Don/doff left AFO     TED Hose - Performed by helper: Don/doff right TED hose, Don/doff left TED hose  Lower body assist Assist for lower body dressing: 2 Helpers      Toileting Toileting Toileting activity did not occur: Safety/medical concerns   Toileting steps completed by helper: Adjust clothing prior to toileting, Performs perineal hygiene, Adjust clothing after toileting Toileting Assistive Devices: Other  (comment)(purwick catheter in place)  Toileting assist Assist level: Two helpers   Transfers Chair/bed transfer Chair/bed transfer activity did not occur: N/A Chair/bed transfer method: Lateral scoot Chair/bed transfer assist level: Touching or steadying assistance (Pt > 75%) Chair/bed transfer assistive device: Sliding board, Armrests Mechanical lift: Maximove   Locomotion Ambulation Ambulation activity did not occur: Safety/medical concerns(NWB R LE/R UE)         Wheelchair Wheelchair activity did not occur: Refused(pt requesting to stay in bed secondary to painful disimpaction prior to therapy ) Type: Manual Max wheelchair distance: 150' Assist Level: Supervision or verbal cues  Cognition Comprehension Comprehension assist level: Follows complex conversation/direction with extra time/assistive device  Expression Expression assist level: Expresses complex ideas: With extra time/assistive device  Social Interaction Social Interaction assist level: Interacts appropriately with others - No medications needed.  Problem Solving Problem solving assist level: Solves complex problems: With extra time  Memory Memory assist level: More than reasonable amount of time    Medical Problem List and Plan: 1.  Decreased functional mobility secondary to nondisplaced type II odontoid fracture/nondisplaced C2 fracture and right C4-cervical collar at all times. Right distal femur fracture/patella fracture with ORIF and closed treatment of patella fracture 02/16/2017 as well as right supracondylar humerus fracture with hinged elbow brace applied.    Cont CIR    Xrays reviewed, per Ortho cont NWB RUE/RLE, WBAT LLE 2.  DVT Prophylaxis/Anticoagulation: Eliquis 3. Pain Management: Lidoderm patch, OxyContin sustained release 10 mg twice a day, oxycodone as needed 4. Mood: Celexa 10 mg daily, increased on 12/6   Team to continue to provide ego support and positive reinforcement as possible   Reactive  depression   Appreciate Neuropsych eval 5. Neuropsych: This patient is ?fully capable of making decisions on her own behalf. 6. Skin/Wound Care: Routine skin checks 7. Fluids/Electrolytes/Nutrition: Routine I&O's 8. Acute blood loss anemia.    Hb 10.9 on 12/7  Cont to monitor 9. CAD/PAF/pacemaker 2015. Follow-up cardiology services needed. Patient is followed at [facility]. Cardiac exam 120 mg twice a day, clonidine 0.1 mg daily and 0.2 mg daily at bedtime, Lopressor 12.5 mg twice a day. Cardiac rate controlled 10. Hyperlipidemia. Crestor 11. GERD. Protonix 12. Hyponatremia   Na+ 134 on 12/7, improving   Cont to monitor 13. Hypoalbuminemia   Supplement initiated 11/20 14. Leukocytosis: Resolved    Cont to monitor   Afebrile 15. HTN   HCTZ d/ced on 11/27 due to abnormal electrolytes   Cont to monitor   Controlled on 12/7 16. Bladder incontinence   Improved with hearing aid   Strong cognitive component  17. Hypokalemia: Resolved   Supplemented 18. Acute lower UTI   UA+, Ucx with multiple species, reordered, pending on 12/7   Empiric Macrobid started on 12/5-12/11    LOS (Days) 18 A FACE TO FACE EVALUATION WAS PERFORMED  Karina [name] 03/13/2017 9:25 AM
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
## 2017-03-13 NOTE — Progress Notes (Signed)
Physical Therapy Session Note  Patient Details  Name: Karina [name] MRN: [id number] Date of Birth: Apr 21, 1922  Today's Date: 03/13/2017 PT Individual Time: 1545-1615 PT Individual Time Calculation (min): 30 min   Short Term Goals: Week 3:  PT Short Term Goal 1 (Week 3): =LTGs due to ELOS  Skilled Therapeutic Interventions/Progress Updates:   Pt received supine in bed and agreeable to PT. Supine>sit transfer with supervision assist and heavy use of bed rail. SB transfer to [facility] with min assist from PT and moderate cues to prevent anterior LOB on board. PT required to place and remove board. WC mobility to day room x 154f with supervision assist and L hemi technique. PT attempted to instructed pt in sit<>stand at high low table, but pt unable to power to full standing on 3 attempts with mod-max assist, while maintain NWB through the RLE. [facility] to [facility] and all needs met.   .        Therapy Documentation Precautions:  Precautions Precautions: Fall, Cervical Precaution Comments: ROM as tolerated right knee, NWB R Knee Required Braces or Orthoses: Other Brace/Splint Cervical Brace: Hard collar, At all times Other Brace/Splint: hinged elbow brace for the right arm  Restrictions Weight Bearing Restrictions: Yes RUE Weight Bearing: Non weight bearing RLE Weight Bearing: Non weight bearing LLE Weight Bearing: Weight bearing as tolerated Other Position/Activity Restrictions:      Vital Signs: Therapy Vitals Pulse Rate: 83 BP: (!) 142/74 0/10  See Function Navigator for Current Functional Status.   Therapy/Group: Individual Therapy  ALorie Phenix12/10/2016, 11:07 PM

## 2017-03-13 NOTE — Progress Notes (Signed)
Physical Therapy Weekly Progress Note  Patient Details  Name: Karina [name] MRN: [id number] Date of Birth: [birth year]  Beginning of progress report period: March 03, 2017 End of progress report period: March 13, 2017  Today's Date: 03/13/2017 PT Individual Time: 1010-1105 PT Individual Time Calculation (min): 55 min    Patient has met 3 of 3 short term goals. Pt is consistently performing slide board transfers at a min-mod assist level and bed mobility w/ min assist. She demonstrates increased functional RLE strength, required decreased assistance w/ RLE management during transfers. W/c level of assistance has improved to supervision and gait remains unable 2/2 NWB precautions of RUE and RLE.   Patient continues to demonstrate the following deficits muscle weakness and muscle joint tightness, decreased cardiorespiratoy endurance and decreased standing balance, decreased balance strategies and difficulty maintaining precautions and therefore will continue to benefit from skilled PT intervention to increase functional independence with mobility.  Patient progressing toward long term goals..  Continue plan of care. Focus remains on decreasing caregiver burden and increasing functional independence while maintaining NWB precautions, await SNF placement. Upgraded dynamic sitting balance and bed mobility LTGs to supervision level due to progress.   PT Short Term Goals Week 2:  PT Short Term Goal 1 (Week 2): Pt will perform bed mobility with min assist PT Short Term Goal 1 - Progress (Week 2): Met PT Short Term Goal 2 (Week 2): Pt will propel w/c x 150 ft with supervision PT Short Term Goal 2 - Progress (Week 2): Met PT Short Term Goal 3 (Week 2): Pt will perform sit<>stand with mod assist PT Short Term Goal 3 - Progress (Week 2): Met Week 3:  PT Short Term Goal 1 (Week 3): =LTGs due to ELOS  Skilled Therapeutic Interventions/Progress Updates:   Pt in w/c upon arrival and agreeable to  therapy, no c/o pain. Pt self-propelled w/c to/from gym w/ supervision using L hemi technique and minimal verbal cues for technique, ~150' each way. Worked on functional mobility while in gym. Performed lateral scoot transfers w/ sliding board on uneven (uphill) surfaces and towards L side, both of which have been challenging transfers in past sessions. Discussed how not all transfers will be set up perfectly for pt to be most successful with transfer, especially at d/c to nursing home. Pt able to perform all transfers w/ min assist this session for RLE management only, fading to min guard w/ RLE management towards end of session. Additionally practiced 1 sit<>supine transfer on mat w/o bed rails present w/ min guard overall and verbal cues for RLE management. Performed sit<>stand x1 from w/c w/ L quad cane, mod assist for boosting and RLE management. Returned to room and ended session in w/c, call bell within reach and all needs met.  Therapy Documentation Precautions:  Precautions Precautions: Fall, Cervical Precaution Comments: ROM as tolerated right knee, NWB R Knee Required Braces or Orthoses: Other Brace/Splint Cervical Brace: Hard collar, At all times Other Brace/Splint: hinged elbow brace for the right arm  Restrictions Weight Bearing Restrictions: Yes RUE Weight Bearing: Non weight bearing RLE Weight Bearing: Non weight bearing LLE Weight Bearing: Weight bearing as tolerated Other Position/Activity Restrictions:    See Function Navigator for Current Functional Status.  Therapy/Group: Individual Therapy  Mandrell Vangilder K Arnette 03/13/2017, 11:43 AM

## 2017-03-13 NOTE — Progress Notes (Signed)
Occupational Therapy Session Note  Patient Details  Name: Karina [name] MRN: [id number] Date of Birth: 1922-12-19  Today's Date: 03/13/2017 OT Individual Time: 1415-1500 OT Individual Time Calculation (min): 45 min    Short Term Goals: Week 1:  OT Short Term Goal 1 (Week 1): Pt will transition from supine to sit with mod assist in preparation for selfcare tasks.  OT Short Term Goal 1 - Progress (Week 1): Not met OT Short Term Goal 2 (Week 1): Pt will complete UB bathing with mod assist sitting EOB.   OT Short Term Goal 2 - Progress (Week 1): Met OT Short Term Goal 3 (Week 1): Pt will donn pullover shirt with mod assist.   OT Short Term Goal 3 - Progress (Week 1): Met OT Short Term Goal 4 (Week 1): Pt will peform LB bathing supine to sit with mod assist.  OT Short Term Goal 4 - Progress (Week 1): Not met OT Short Term Goal 5 (Week 1): Pt will perform sliding board transfer from wheelchair to drop arm commode with max assist.  OT Short Term Goal 5 - Progress (Week 1): Met  Skilled Therapeutic Interventions/Progress Updates:    1:1. Pt requesting to toilet. Pt completes slide board transfer 2x w/c<>BSC with VC for head hips relationship, hand placement throughout transfer, and a board under her L foot d/t LE not reaching ground. Pt requires MOD A to L and MIN A to R (downhill) for trnasfer. Bucket fell out of BSC 2x prior to voiding. Pt slide board to bed with MOD A and similar cuing as above and rolls B for OT to advance pants past hips/place bed pant. Pt voids urine and completes peri care. Pt roll B with min A and OT advances pants past hips. Exited session with pt seated in bed with call light in reach and all needs met.  Therapy Documentation Precautions:  Precautions Precautions: Fall, Cervical Precaution Comments: ROM as tolerated right knee, NWB R Knee Required Braces or Orthoses: Other Brace/Splint Cervical Brace: Hard collar, At all times Other Brace/Splint: hinged elbow brace  for the right arm  Restrictions Weight Bearing Restrictions: Yes RUE Weight Bearing: Non weight bearing RLE Weight Bearing: Non weight bearing LLE Weight Bearing: Weight bearing as tolerated Other Position/Activity Restrictions:    See Function Navigator for Current Functional Status.   Therapy/Group: Individual Therapy  Tonny Branch 03/13/2017, 5:22 PM

## 2017-03-14 DIAGNOSIS — G5791 Unspecified mononeuropathy of right lower limb: Secondary | ICD-10-CM

## 2017-03-14 MED ORDER — LIDOCAINE 5 % EX PTCH
1.0000 | MEDICATED_PATCH | CUTANEOUS | Status: DC
Start: 2017-03-14 — End: 2017-03-19
  Administered 2017-03-14 – 2017-03-18 (×5): 1 via TRANSDERMAL
  Filled 2017-03-14 (×6): qty 1
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
## 2017-03-15 ENCOUNTER — Inpatient Hospital Stay (HOSPITAL_COMMUNITY): Payer: Medicare Other

## 2017-03-15 LAB — URINE CULTURE

## 2017-03-16 ENCOUNTER — Inpatient Hospital Stay (HOSPITAL_COMMUNITY): Payer: Medicare Other

## 2017-03-16 ENCOUNTER — Inpatient Hospital Stay (HOSPITAL_COMMUNITY): Payer: Medicare Other | Admitting: Occupational Therapy

## 2017-03-16 MED ORDER — SIMETHICONE 80 MG PO CHEW: 80.0000 mg | CHEWABLE_TABLET | Freq: Four times a day (QID) | ORAL | Status: DC | PRN

## 2017-03-16 NOTE — Progress Notes (Signed)
[facility] PHYSICAL MEDICINE & REHABILITATION     PROGRESS NOTE  Subjective/Complaints:  Pt seen sitting up in bed this AM.  She slept well overnight.  She states she had a terrible weekend due to gas from not ambulating.   ROS: Denies nausea, vomiting, diarrhea, shortness of breath or chest pain   Objective: Vital Signs: Blood pressure 140/77, pulse 85, temperature 98 F (36.7 C), temperature source Oral, resp. rate 16, height 5\' 1"  (1.549 m), weight 61 kg (134 lb 7.7 oz), SpO2 95 %. No results found. No results for input(s): WBC, HGB, HCT, PLT in the last 72 hours. No results for input(s): NA, K, CL, GLUCOSE, BUN, CREATININE, CALCIUM in the last 72 hours.  Invalid input(s): CO CBG (last 3)  No results for input(s): GLUCAP in the last 72 hours.  Wt Readings from Last 3 Encounters:  03/12/17 61 kg (134 lb 7.7 oz)  02/15/17 59.9 kg (132 lb)  11/18/16 56.7 kg (125 lb)    Physical Exam:  BP 140/77   Pulse 85   Temp 98 F (36.7 C) (Oral)   Resp 16   Ht 5\' 1"  (1.549 m)   Wt 61 kg (134 lb 7.7 oz)   SpO2 95%   BMI 25.41 kg/m  Constitutional: She appears well-developed and well-nourished. NAD. HENT: Normocephalic and atraumatic.  Eyes: EOM are normal. No discharge.  Neck: +C-collar in place  Cardiovascular: RRR. No JVD . Respiratory: CTA Bilaterally. Normal effort  GI: Bowel sounds are normal. Non distended. Musculoskeletal: She exhibits edema and tenderness right thigh area.  No tenderness over the right great toe no tenderness at the MTP no erythema no joint swelling.  No hypersensitivity to touch.   Neurological: She is alert.  Motor: RUE: limited by sling, finger flexors 2/5 LUE: 5/5 proximal to distal RLE: 3/5 HF, KE, 2-/5 ADF/PF (improving) LLE: HF 4/5, KE 4+/5, ADF/PF 2-/5 (stable) No numbness in the toes to light touch HOH  Skin: Skin is warm and dry. Dressings and incision c/d/i  Psychiatric: Flat.   Assessment/Plan: 1. Functional deficits secondary to  polytrauma which require 3+ hours per day of interdisciplinary therapy in a comprehensive inpatient rehab setting. Physiatrist is providing close team supervision and 24 hour management of active medical problems listed below. Physiatrist and rehab team continue to assess barriers to discharge/monitor patient progress toward functional and medical goals.  Function:  Bathing Bathing position   Position: Sitting EOB  Bathing parts Body parts bathed by patient: Right arm, Chest, Abdomen, Right upper leg, Left upper leg Body parts bathed by helper: Buttocks, Front perineal area, Left arm, Right lower leg, Left lower leg, Back  Bathing assist Assist Level: (total A)      Upper Body Dressing/Undressing Upper body dressing   What is the patient wearing?: Pull over shirt/dress     Pull over shirt/dress - Perfomed by patient: Thread/unthread left sleeve, Pull shirt over trunk Pull over shirt/dress - Perfomed by helper: Thread/unthread right sleeve, Put head through opening Button up shirt - Perfomed by patient: Thread/unthread right sleeve, Thread/unthread left sleeve Button up shirt - Perfomed by helper: Pull shirt around back, Button/unbutton shirt    Upper body assist Assist Level: 2 helpers      Lower Body Dressing/Undressing Lower body dressing   What is the patient wearing?: Pants, Non-skid slipper socks, Ted Hose     Pants- Performed by patient: Thread/unthread right pants leg, Thread/unthread left pants leg, Pull pants up/down Pants- Performed by helper: Thread/unthread right  pants leg, Thread/unthread left pants leg, Pull pants up/down   Non-skid slipper socks- Performed by helper: Don/doff right sock, Don/doff left sock       Shoes - Performed by helper: Don/doff left shoe AFO - Performed by patient: Don/doff left AFO     TED Hose - Performed by helper: Don/doff right TED hose, Don/doff left TED hose  Lower body assist Assist for lower body dressing: 2 Helpers       Toileting Toileting Toileting activity did not occur: Safety/medical concerns   Toileting steps completed by helper: Adjust clothing prior to toileting, Performs perineal hygiene, Adjust clothing after toileting Toileting Assistive Devices: Other (comment)(purwick catheter in place)  Toileting assist Assist level: Two helpers   Transfers Chair/bed transfer Chair/bed transfer activity did not occur: N/A Chair/bed transfer method: Lateral scoot Chair/bed transfer assist level: Touching or steadying assistance (Pt > 75%) Chair/bed transfer assistive device: Sliding board, Armrests Mechanical lift: Maximove   Locomotion Ambulation Ambulation activity did not occur: Safety/medical concerns(NWB R LE/R UE)         Wheelchair Wheelchair activity did not occur: Refused(pt requesting to stay in bed secondary to painful disimpaction prior to therapy ) Type: Manual Max wheelchair distance: 150' Assist Level: Supervision or verbal cues  Cognition Comprehension Comprehension assist level: Follows complex conversation/direction with extra time/assistive device  Expression Expression assist level: Expresses complex ideas: With extra time/assistive device  Social Interaction Social Interaction assist level: Interacts appropriately with others - No medications needed.  Problem Solving Problem solving assist level: Solves complex problems: With extra time  Memory Memory assist level: More than reasonable amount of time    Medical Problem List and Plan: 1.  Decreased functional mobility secondary to nondisplaced type II odontoid fracture/nondisplaced C2 fracture and right C4-cervical collar at all times. Right distal femur fracture/patella fracture with ORIF and closed treatment of patella fracture 02/16/2017 as well as right supracondylar humerus fracture with hinged elbow brace applied.    Cont CIR    Xrays reviewed, per Ortho cont NWB RUE/RLE, WBAT LLE 2.  DVT Prophylaxis/Anticoagulation:  Eliquis 3. Pain Management: Lidoderm patch, OxyContin sustained release 10 mg twice a day, oxycodone as needed 4. Mood: Celexa 10 mg daily, increased on 12/6   Team to continue to provide ego support and positive reinforcement as possible   Reactive depression   Appreciate Neuropsych eval 5. Neuropsych: This patient is ?fully capable of making decisions on her own behalf. 6. Skin/Wound Care: Routine skin checks 7. Fluids/Electrolytes/Nutrition: Routine I&O's 8. Acute blood loss anemia.    Hb 10.9 on 12/7  Cont to monitor 9. CAD/PAF/pacemaker 2015. Follow-up cardiology services needed. Patient is followed at [facility]. Cardiac exam 120 mg twice a day, clonidine 0.1 mg daily and 0.2 mg daily at bedtime, Lopressor 12.5 mg twice a day. Cardiac rate controlled 10. Hyperlipidemia. Crestor 11. GERD. Protonix 12. Hyponatremia   Na+ 134 on 12/7, improving   Cont to monitor 13. Hypoalbuminemia   Supplement initiated 11/20 14. Leukocytosis: Resolved    Cont to monitor   Afebrile 15. HTN   HCTZ d/ced on 11/27 due to abnormal electrolytes    Vitals:   03/16/17 0430 03/16/17 0914  BP: 137/76 140/77  Pulse: 81 85  Resp: 16   Temp: 98 F (36.7 C)   SpO2: 95%      Controlled on 12/10 16. Bladder incontinence   Improved with hearing aid   Strong cognitive component  17. Hypokalemia: Resolved   Supplemented 18. Acute lower  UTI   UA+, Ucx with multiple species x2   Empiric Macrobid started on 12/5-12/11 19. Gas   Simethicone PRN ordered on 12/10    LOS (Days) 21 A FACE TO FACE EVALUATION WAS PERFORMED  Neave Lenger Lorie Phenix 03/16/2017 9:21 AM

## 2017-03-16 NOTE — Progress Notes (Signed)
Physical Therapy Session Note  Patient Details  Name: Karina [name] MRN: [id number] Date of Birth: [birth year]  Today's Date: 03/16/2017 PT Individual Time: 1000-1115, 1445-1530 PT Individual Time Calculation (min): 75 min , 45 min  Short Term Goals: Week 3:  PT Short Term Goal 1 (Week 3): =LTGs due to ELOS  Skilled Therapeutic Interventions/Progress Updates:    Session 1: Pt seated in w/c upon PT arrival, agreeable to therapy tx and denies pain. Therapist donned teds, shoes and AFOs bilaterally with total assist. Pt propelled w/c from room>gym x 150 ft using L LE and min assist for steering, verbal cues for techniques. Worked on w/c propulsion with turns in order to navigate around cones, pt continues to require min assist for turning despite max verbal cues. Pt seated in w/c performed LE therex: 2 x 10 LAQ, 2 x 10 hip abduction, 2 x 10 ball squeezes, 2 x 10 heel slides, 2 x 30 sec hamstring stretch, 3 x 30 sec quadriceps stretch. Pt performed sit<>stands x5 with mod assist using parallel bar for L UE support, able to stand 30 sec to a minute.  In standing pt performed mini single leg squats with L LE and x 10 hip abduction with R LE. Pt propelled w/c back to room and left seated in w/c with needs in reach.   Session 2: Pt seated in w/c upon PT arrival, agreeable to therapy tx and denies pain. Pt propelled w/c from room>gym x 150 ft with supervision using R LE. Pt worked on dynamic seated balance in order to hit ball back and forth using L UE and to kick ball back and forth with R LE. Reviewed pt's HEP with emphasis on knee ROM exercises, handout provided. Therapist performed manual stretching for ROM on R LE: 2 x 1 min knee flexion and 2 x 1 min knee extension. Pt propelled w/c back to room, left seated in w/c with needs in reach and ice applied to R knee.   Therapy Documentation Precautions:  Precautions Precautions: Fall, Cervical Precaution Comments: ROM as tolerated right knee, NWB R  Knee Required Braces or Orthoses: Other Brace/Splint Cervical Brace: Hard collar, At all times Other Brace/Splint: hinged elbow brace for the right arm  Restrictions Weight Bearing Restrictions: Yes RUE Weight Bearing: Non weight bearing RLE Weight Bearing: Non weight bearing LLE Weight Bearing: Weight bearing as tolerated Other Position/Activity Restrictions:     See Function Navigator for Current Functional Status.   Therapy/Group: Individual Therapy  Netta Corrigan, PT, DPT 03/16/2017, 10:26 AM

## 2017-03-16 NOTE — Progress Notes (Addendum)
Occupational Therapy Session Note  Patient Details  Name: Karina [name] MRN: [id number] Date of Birth: 22-Oct-1922  Today's Date: 03/16/2017 OT Individual Time: 1400-1445 OT Individual Time Calculation (min): 45 min    Short Term Goals: Week 3:  OT Short Term Goal 1 (Week 3): Continue working on [employer].    Skilled Therapeutic Interventions/Progress Updates:    Pt received sitting in w/c in dayroom, agreeable to OT tx session. Assisted pt back to room via w/c, Pt requesting to wash her hair this session. Pt completed modified task seated in w/c with back against sink. Pt engaged in task using LUE to wash hair, with assist for completing on R side. Pt completed grooming ADLs while seated at sink with min verbal cues/education for using R hand to assist as gross stabilizer when opening containers and applying toothpaste. Pt then engaged in seated AROM to LUE and LLE, AAROM for RLE for 10-15 reps each. Pt left seated in w/c, handoff to PT for start of session.   Therapy Documentation Precautions:  Precautions Precautions: Fall, Cervical Precaution Comments: ROM as tolerated right knee, NWB R Knee Required Braces or Orthoses: Other Brace/Splint Cervical Brace: Hard collar, At all times Other Brace/Splint: hinged elbow brace for the right arm  Restrictions Weight Bearing Restrictions: Yes RUE Weight Bearing: Non weight bearing RLE Weight Bearing: Non weight bearing LLE Weight Bearing: Weight bearing as tolerated Other Position/Activity Restrictions:     Pain: Pain Assessment Pain Assessment: No/denies pain  See Function Navigator for Current Functional Status.   Therapy/Group: Individual Therapy  Raymondo Band 03/16/2017, 4:39 PM

## 2017-03-17 ENCOUNTER — Inpatient Hospital Stay (HOSPITAL_COMMUNITY): Payer: Medicare Other | Admitting: Occupational Therapy

## 2017-03-17 ENCOUNTER — Inpatient Hospital Stay (HOSPITAL_COMMUNITY): Payer: Medicare Other | Admitting: Physical Therapy

## 2017-03-17 ENCOUNTER — Inpatient Hospital Stay (HOSPITAL_COMMUNITY): Payer: Medicare Other

## 2017-03-17 NOTE — Progress Notes (Signed)
Occupational Therapy Session Note  Patient Details  Name: Karina [name] MRN: [id number] Date of Birth: 10/08/1922  Today's Date: 03/17/2017 OT Individual Time: 1003-1059 OT Individual Time Calculation (min): 56 min    Short Term Goals: Week 2:  OT Short Term Goal 1 (Week 2): Pt will transition from supine to sit with mod assist in preparation for selfcare tasks.  OT Short Term Goal 1 - Progress (Week 2): Met OT Short Term Goal 2 (Week 2): Pt will peform LB bathing supine to sit with mod assist.  OT Short Term Goal 2 - Progress (Week 2): Not met OT Short Term Goal 3 (Week 2): Pt will complete toilet transfer with mod assist using the sliding board. OT Short Term Goal 3 - Progress (Week 2): Met OT Short Term Goal 4 (Week 2): Pt will complete toileting with max assist, lateral leans in sitting for clothing and hygiene OT Short Term Goal 4 - Progress (Week 2): Met  Skilled Therapeutic Interventions/Progress Updates:    Pt completed bathing from wheelchair as she was already sitting up in the wheelchair to start session.  She was able to wash her UB with min assist and donn the button up shirt at the same level.  She washed her UB legs with therapist providing assistance for washing the lower legs as well as for donning all LB clothing this session.  She was able to complete grooming task of brushing her hair with supervision from the wheelchair as well.  Finished session with pt at bedside with call button and phone in reach and pt listening to her music.    Therapy Documentation Precautions:  Precautions Precautions: Fall, Cervical Precaution Comments: ROM as tolerated right knee, NWB R Knee Required Braces or Orthoses: Other Brace/Splint Cervical Brace: Hard collar, At all times Other Brace/Splint: hinged elbow brace for the right arm  Restrictions Weight Bearing Restrictions: Yes RUE Weight Bearing: Non weight bearing RLE Weight Bearing: Non weight bearing LLE Weight Bearing:  Weight bearing as tolerated Other Position/Activity Restrictions:     Pain: Pain Assessment Pain Assessment: No/denies pain ADL: See Function Navigator for Current Functional Status.   Therapy/Group: Individual Therapy  ,  OTR/L 03/17/2017, 12:51 PM  

## 2017-03-17 NOTE — Progress Notes (Signed)
Physical Therapy Session Note  Patient Details  Name: Karina [name] MRN: [id number] Date of Birth: 12/17/1922  Today's Date: 03/17/2017 PT Individual Time: 0900-1000 PT Individual Time Calculation (min): 60 min   Short Term Goals: Week 3:  PT Short Term Goal 1 (Week 3): =LTGs due to ELOS  Skilled Therapeutic Interventions/Progress Updates:   Pt in w/c upon arrival and agreeable to therapy, no c/o pain. Pt self-propelled w/c to/from therapy gym w/ supervision using L hemi technique, 150' each way. Additionally practiced pt managing w/c parts for increased independence w/ mobility, min assist overall. Practiced slide board transfers to/from mat in both directions going both uphill and downhill w/ min guard overall for RLE management, however pt w/ increased independence w/ managing RLE and maintaing NWB precautions. Verbal and visual cues for lateral scooting techniques. In between transfers, reviewed HEP including R heel slides, R knee marches, R LAQs, and R ABCs w/ emphasis on activating hip musculature. Encouraged pt to remember exercises on her own, required minimal verbal cues. Performed passive quad and hs stretching to RLE during rest as well. Returned to room in w/c and ended session sitting up in w/c, call bell within reach and all needs met.   Therapy Documentation Precautions:  Precautions Precautions: Fall, Cervical Precaution Comments: ROM as tolerated right knee, NWB R Knee Required Braces or Orthoses: Other Brace/Splint Cervical Brace: Hard collar, At all times Other Brace/Splint: hinged elbow brace for the right arm  Restrictions Weight Bearing Restrictions: Yes RUE Weight Bearing: Non weight bearing RLE Weight Bearing: Non weight bearing LLE Weight Bearing: Weight bearing as tolerated Other Position/Activity Restrictions:   Vital Signs: Therapy Vitals Pulse Rate: 86 BP: 136/81  See Function Navigator for Current Functional Status.   Therapy/Group: Individual  Therapy  [name] 03/17/2017, 12:37 PM

## 2017-03-17 NOTE — Progress Notes (Signed)
Physical Therapy Session Note  Patient Details  Name: Karina [name] MRN: [id number] Date of Birth: 06-20-1922  Today's Date: 03/17/2017 PT Individual Time: 5183-3582 PT Individual Time Calculation (min): 72 min   Short Term Goals: Week 3:  PT Short Term Goal 1 (Week 3): =LTGs due to ELOS  Skilled Therapeutic Interventions/Progress Updates:    Pt supine in bed upon PT arrival, agreeable to therapy tx and denies pain. Pt transferred from supine>sitting EOB with supervision. Pt transferred from bed>w/c using slideboard and lateral scooting with min assist, verbal cues for techniques. Pt propelled w/c from room>gym x 150 ft with supervision using L UE/L LE. While seated in w/c pt worked on LE therex: 2 x 10 heel slides, 2 x 10 LAQ, 2 x 10 SLR, 2 x 10 ball squeezes, 2 x 10 hip abduction.  Therapist performed R knee flexion/extension stretching for ROM. Therapist measured current ROM as follows:  AROM: R knee flexion- 58 degrees, R knee extension- lacking 10 degrees PROM: R knee flexion-  65 degrees, R knee extension- lacking 4 degrees  Pt performed sit<>stands x 3 with mod assist, single UE support and NWB R LE/R UE.  Pt worked on w/c propulsion throughout hospital >300 ft and also worked on propelled inclines/declines with supervision.  Pt left seated in w/c at end of session with needs in reach.   Therapy Documentation Precautions:  Precautions Precautions: Fall, Cervical Precaution Comments: ROM as tolerated right knee, NWB R Knee Required Braces or Orthoses: Other Brace/Splint Cervical Brace: Hard collar, At all times Other Brace/Splint: hinged elbow brace for the right arm  Restrictions Weight Bearing Restrictions: Yes RUE Weight Bearing: Non weight bearing RLE Weight Bearing: Non weight bearing LLE Weight Bearing: Weight bearing as tolerated Other Position/Activity Restrictions:     See Function Navigator for Current Functional Status.   Therapy/Group: Individual  Therapy  Netta Corrigan , PT, DPT 03/17/2017, 7:51 AM
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
## 2017-03-18 ENCOUNTER — Inpatient Hospital Stay (HOSPITAL_COMMUNITY): Payer: Medicare Other | Admitting: Occupational Therapy

## 2017-03-18 ENCOUNTER — Inpatient Hospital Stay (HOSPITAL_COMMUNITY): Payer: Medicare Other

## 2017-03-18 DIAGNOSIS — M79674 Pain in right toe(s): Secondary | ICD-10-CM

## 2017-03-18 MED ORDER — DICLOFENAC SODIUM 1 % TD GEL
2.0000 g | Freq: Four times a day (QID) | TRANSDERMAL | Status: DC
  Administered 2017-03-18 – 2017-03-19 (×2): 2 g via TOPICAL
  Filled 2017-03-18: qty 100

## 2017-03-18 MED ORDER — CITALOPRAM HYDROBROMIDE 20 MG PO TABS: 20.0000 mg | ORAL_TABLET | Freq: Every day | ORAL | Status: DC

## 2017-03-18 MED ORDER — MIRTAZAPINE 15 MG PO TBDP: 15.0000 mg | ORAL_TABLET | Freq: Every day | ORAL | Status: DC

## 2017-03-18 MED ORDER — METOPROLOL TARTRATE 25 MG PO TABS: 12.5000 mg | ORAL_TABLET | Freq: Two times a day (BID) | ORAL | Status: DC

## 2017-03-18 MED ORDER — OXYCODONE HCL 5 MG PO TABS: 5.0000 mg | ORAL_TABLET | ORAL | 0 refills | Status: DC | PRN

## 2017-03-18 MED ORDER — LIDOCAINE 5 % EX PTCH: 1.0000 | MEDICATED_PATCH | CUTANEOUS | 0 refills | Status: DC

## 2017-03-18 MED ORDER — DICLOFENAC SODIUM 1 % TD GEL: 2.0000 g | Freq: Four times a day (QID) | TRANSDERMAL | Status: DC

## 2017-03-18 MED ORDER — OXYCODONE HCL ER 10 MG PO T12A: 10.0000 mg | EXTENDED_RELEASE_TABLET | Freq: Every day | ORAL | 0 refills | Status: DC
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
## 2017-03-18 NOTE — Progress Notes (Signed)
Occupational Therapy Discharge Summary  Patient Details  Name: Karina [name] MRN: [id number] Date of Birth: Sep 25, 1922  Today's Date: 03/18/2017 OT Individual Time:  -       Patient has met 6 of 8 long term goals due to improved activity tolerance, improved balance, postural control and ability to compensate for deficits.  Patient to discharge at overall Mod Assist level.  Patient's care partner requires assistance to provide the necessary physical and cognitive assistance at discharge.    Reasons goals not met: Pt needs mod to max assist for LB dressing and toileting.    Recommendation:  Patient will benefit from ongoing skilled OT services in skilled nursing facility setting to continue to advance functional skills in the area of BADL.  Pt still requires significant assist with toileting tasks as well as basic selfcare tasks secondary to not being able to use the RUE or perform weightbearing thru the right arm or leg.  Feel pt will continue to progress once weightbearing status changes.  Based on not having any assist at home feel SNF is the best discharge situation at this time and for continued OT once weightbearing status has changed.    Equipment: No equipment provided  Reasons for discharge: treatment goals met and discharge from hospital  Patient/family agrees with progress made and goals achieved: Yes  OT Discharge Precautions/Restrictions  Precautions Precautions: Fall;Cervical Precaution Comments: ROM as tolerated right knee, NWB R Knee Required Braces or Orthoses: Other Brace/Splint Cervical Brace: Hard collar;At all times Other Brace/Splint: hinged elbow brace for the right arm  Restrictions Weight Bearing Restrictions: Yes RUE Weight Bearing: Non weight bearing RLE Weight Bearing: Non weight bearing LLE Weight Bearing: Weight bearing as tolerated  ADL  See Function Section of chart for details  Vision Baseline Vision/History: Wears glasses Wears Glasses:  Reading only Patient Visual Report: No change from baseline Perception  Perception: Within Functional Limits Praxis Praxis: Intact Cognition Overall Cognitive Status: History of cognitive impairments - at baseline Arousal/Alertness: Awake/alert Orientation Level: Oriented X4 Attention: Sustained Sustained Attention: Appears intact Selective Attention: Appears intact Memory: Impaired Awareness: Impaired Awareness Impairment: Anticipatory impairment Problem Solving: Impaired Problem Solving Impairment: Functional complex Safety/Judgment: Appears intact Comments: Pt with decreased carryover from session to session with regards to techniques and treatment activities completed in previous sessions.   Sensation Sensation Light Touch: Appears Intact Stereognosis: Appears Intact Hot/Cold: Appears Intact Proprioception: Appears Intact Additional Comments: intact sensation B LEs Coordination Gross Motor Movements are Fluid and Coordinated: No Fine Motor Movements are Fluid and Coordinated: No Coordination and Movement Description: Pt with hinged elbow brace on the RUE,  Still not allowed AROM at the elbow or weightbearing in the RUE at this time.  Motor  Motor Motor - Skilled Clinical Observations: generalized weakness Mobility  Bed Mobility Bed Mobility: Rolling Right;Rolling Left;Sit to Supine;Supine to Sit Rolling Right: 5: Supervision;With rail Rolling Left: 5: Supervision;With rail Supine to Sit: 5: Supervision;With rails Sit to Supine: 4: Min assist  Trunk/Postural Assessment  Cervical Assessment Cervical Assessment: Exceptions to WFL(Pt with cervical collar) Thoracic Assessment Thoracic Assessment: Exceptions to WFL(thoracic kyphosis) Lumbar Assessment Lumbar Assessment: Within Functional Limits Postural Control Postural Control: Within Functional Limits  Balance Balance Balance Assessed: Yes Static Sitting Balance Static Sitting - Balance Support: Feet  supported Static Sitting - Level of Assistance: 6: Modified independent (Device/Increase time) Dynamic Sitting Balance Dynamic Sitting - Balance Support: During functional activity Dynamic Sitting - Level of Assistance: 5: Stand by assistance Static Standing Balance Static Standing -  Level of Assistance: 4: Min assist(L LE/L UE support) Dynamic Standing Balance Dynamic Standing - Level of Assistance: 3: Mod assist(L LE/L UE support) Extremity/Trunk Assessment RUE Assessment RUE Assessment: Exceptions to [facility] RUE Strength RUE Overall Strength Comments: Pt with humeral fracture and in splint with hinged elbow brace.  Shoulder AROM 0-70 degrees, no AROM allowed at this time for elbow.  She was able to demonstrate full AROM digit flexion/extension and wrist flexion extension.   LUE Assessment LUE Assessment: Within Functional Limits   See Function Navigator for Current Functional Status.  Romel Dumond OTR/L 03/18/2017, 5:25 PM

## 2017-03-18 NOTE — Progress Notes (Signed)
Physical Therapy Session Note  Patient Details  Name: Karina [name] MRN: [id number] Date of Birth: 1922/12/13  Today's Date: 03/18/2017 PT Individual Time: 1015-1126 PT Individual Time Calculation (min): 71 min   Short Term Goals: Week 3:  PT Short Term Goal 1 (Week 3): =LTGs due to ELOS  Skilled Therapeutic Interventions/Progress Updates:    Session 1: Pt seated in w/c upon PT arrival, agreeable to therapy tx and denies pain at rest. Pt propelled w/c throughout unit >150 ft with supervision using L UE/L LE. Focused on w/c parts management for applying/taking off breaks, moving armrests and placing slideboard for transfers, requiring verbal cues but able to perform with supervision. Pt performed x 2 car transfers with min assist and verbal cues for technique, lateral scooting using slideboard. Pt propelled w/c to other gym and set up w/c for transfer. Pt worked on [employer] transfer from w/c>mat using slideboard, requiring mod assist to go uphill and max verbal cues. Pt transferred sitting<>supine on the mat (no bedrails) with min assist and verbal cues for techniques. Pt performed supine R LE exercises: 2 x 30 manual heel cord & hamstring stretches, 2 x 10 heel slides, x 10 knee flexion/extension PROM, 2 x 10 active assisted SLRs and 2 x 10 hip abduction.  Pt transferred back to w/c with min assist using slideboard. Pt left seated in w/c at end of session with needs in reach.     Therapy Documentation Precautions:  Precautions Precautions: Fall, Cervical Precaution Comments: ROM as tolerated right knee, NWB R Knee Required Braces or Orthoses: Other Brace/Splint Cervical Brace: Hard collar, At all times Other Brace/Splint: hinged elbow brace for the right arm  Restrictions Weight Bearing Restrictions: Yes RUE Weight Bearing: Non weight bearing RLE Weight Bearing: Non weight bearing LLE Weight Bearing: Weight bearing as tolerated Other Position/Activity Restrictions:     See  Function Navigator for Current Functional Status.   Therapy/Group: Individual Therapy  Netta Corrigan, PT, DPT 03/18/2017, 10:50 AM

## 2017-03-18 NOTE — Progress Notes (Signed)
Occupational Therapy Session Note  Patient Details  Name: Karina [name] MRN: [id number] Date of Birth: 1922-09-23  Today's Date: 03/18/2017 OT Individual Time: 0801-0900 OT Individual Time Calculation (min): 59 min    Short Term Goals: Week 3:  OT Short Term Goal 1 (Week 3): Continue working on [employer].    Skilled Therapeutic Interventions/Progress Updates:    Pt completed bathing and dressing supine to sit EOB.  Min assist for UB bathing and dressing with total assist for removal and donning of hinged elbow brace as well.  She transitioned back to supine with min assist for washing peri area and donning new brief, both with total assist.  Supervision to transition back to sitting where she was also assisted with donning all LB clothing secondary to flexibility limitations and decreased time.  Min assist for sliding board transfer to the wheelchair.  She finished brushing her teeth and combing her hair at the sink with supervision.  Pt left with call button and phone in reach to conclude session.    Therapy Documentation Precautions:  Precautions Precautions: Fall, Cervical Precaution Comments: ROM as tolerated right knee, NWB R Knee Required Braces or Orthoses: Other Brace/Splint Cervical Brace: Hard collar, At all times Other Brace/Splint: hinged elbow brace for the right arm  Restrictions Weight Bearing Restrictions: Yes RUE Weight Bearing: Non weight bearing RLE Weight Bearing: Non weight bearing LLE Weight Bearing: Weight bearing as tolerated Other Position/Activity Restrictions:     Pain: Pain Assessment Pain Assessment: No/denies pain ADL: See Function Navigator for Current Functional Status.   Therapy/Group: Individual Therapy  Taurus Willis OTR/L 03/18/2017, 12:13 PM

## 2017-03-18 NOTE — Progress Notes (Signed)
Physical Therapy Discharge Summary  Patient Details  Name: Karina [name] MRN: [id number] Date of Birth: 1922-08-31  Today's Date: 03/18/2017 PT Individual Time: 7673-4193 PT Individual Time Calculation (min): 59 min   5Patient has met 5 of 5 long term goals due to improved activity tolerance, increased strength and ability to compensate for deficits.  Patient to discharge at a wheelchair level [place].   Patient's care partner unavailable to provide the necessary physical assistance at discharge.Pt does not have available assistance at d/c and will be pursuing a skilled nursing facility instead. Pt has met all goals here in CIR and is unable to further progress in regards to mobility until NWB precautions are lifted and fractures are healed.   All goals met.   Recommendation:  Patient will benefit from ongoing skilled PT services in skilled nursing facility setting to continue to advance safe functional mobility, address ongoing impairments in functional mobility, ROM, strength and minimize fall risk.  Equipment: No equipment provided  Reasons for discharge: treatment goals met and discharge from hospital  Patient/family agrees with progress made and goals achieved: Yes   PT treatment interventions: Pt supine in bed upon PT arrival, agreeable to therapy tx and denies pain. Pt transferred from supine>sitting EOB with supervision, using bed rails to pull up. Pt transferred from bed>w/c using slideboard and min assist. Pt propelled w/c from room>gym. Discharge summary completed this session with focus on , transfers, w/c propulsion, knee ROM and HEP. Pt performed bed<>w/c transfers and car transfers with min assist. Pt performed all bed mobility from hospital bed with supervision. Pt propelled w/c on level and un-level surfaces with supervision and verbal cues for w/c parts management. Pt performed sit<>stands with single L UE support and L LE support, mod assist to maintain NWB  precautions. Seated in w/c pt performed LE strengthening exercises: 2 x 10 LAQ, 2 x 10 hip abduction, 2 x 10 SLR and 2 x 10 heel slides. Therapist performed knee passive ROM for flexion and extension. Pt left seated in w/c at end of session with needs in reach.   PT Discharge Precautions/Restrictions Precautions Precautions: Fall;Cervical Precaution Comments: ROM as tolerated right knee, NWB R Knee Required Braces or Orthoses: Other Brace/Splint Cervical Brace: Hard collar;At all times Other Brace/Splint: hinged elbow brace for the right arm  Restrictions Weight Bearing Restrictions: Yes RUE Weight Bearing: Non weight bearing RLE Weight Bearing: Non weight bearing LLE Weight Bearing: Weight bearing as tolerated Pain   denies pain Cognition Overall Cognitive Status: Within Functional Limits for tasks assessed Arousal/Alertness: Awake/alert Orientation Level: Oriented X4 Sustained Attention: Appears intact Memory: Appears intact Awareness: Appears intact Problem Solving: Appears intact Safety/Judgment: Appears intact Sensation Sensation Light Touch: Appears Intact Proprioception: Appears Intact Additional Comments: intact sensation B LEs Coordination Gross Motor Movements are Fluid and Coordinated: No Fine Motor Movements are Fluid and Coordinated: No Motor  Motor Motor - Skilled Clinical Observations: generalized weakness  Trunk/Postural Assessment  Cervical Assessment Cervical Assessment: Exceptions to WFL(cervical collar) Thoracic Assessment Thoracic Assessment: Exceptions to WFL(thoracic rounding) Lumbar Assessment Lumbar Assessment: Within Functional Limits Postural Control Postural Control: Within Functional Limits  Balance Balance Balance Assessed: Yes Static Sitting Balance Static Sitting - Level of Assistance: 6: Modified independent (Device/Increase time) Dynamic Sitting Balance Dynamic Sitting - Level of Assistance: 6: Modified independent (Device/Increase  time) Static Standing Balance Static Standing - Level of Assistance: 4: Min assist(L LE/L UE support) Dynamic Standing Balance Dynamic Standing - Level of Assistance: 3: Mod assist(L LE/L UE support)  Extremity Assessment  LLE Assessment LLE Assessment: Within Functional Limits(except for 1/5 PF and DF (prior and uses AFOs))   See Function Navigator for Current Functional Status.  Netta Corrigan, PT, DPT 03/18/2017, 2:53 PM

## 2017-03-18 NOTE — Discharge Summary (Signed)
Discharge summary job [id number]

## 2017-03-18 NOTE — Progress Notes (Signed)
[place] PHYSICAL MEDICINE & REHABILITATION     PROGRESS NOTE  Subjective/Complaints:  Patient seen sitting up at the edge of her bed this morning eating breakfast. She states she slept well overnight. She notes right toe pain overnight, but it has resolved this morning.  ROS: Denies nausea, vomiting, diarrhea, shortness of breath or chest pain   Objective: Vital Signs: Blood pressure 133/76, pulse 79, temperature 97.7 F (36.5 C), temperature source Oral, resp. rate 16, height 5\' 1"  (1.549 m), weight 61 kg (134 lb 7.7 oz), SpO2 95 %. No results found. No results for input(s): WBC, HGB, HCT, PLT in the last 72 hours. No results for input(s): NA, K, CL, GLUCOSE, BUN, CREATININE, CALCIUM in the last 72 hours.  Invalid input(s): CO CBG (last 3)  No results for input(s): GLUCAP in the last 72 hours.  Wt Readings from Last 3 Encounters:  03/12/17 61 kg (134 lb 7.7 oz)  02/15/17 59.9 kg (132 lb)  11/18/16 56.7 kg (125 lb)    Physical Exam:  BP 133/76 (BP Location: Left Arm)   Pulse 79   Temp 97.7 F (36.5 C) (Oral)   Resp 16   Ht 5\' 1"  (1.549 m)   Wt 61 kg (134 lb 7.7 oz)   SpO2 95%   BMI 25.41 kg/m  Constitutional: She appears well-developed and well-nourished. NAD. HENT: Normocephalic and atraumatic.  Eyes: EOM are normal. No discharge.  Neck: +C-collar in place  Cardiovascular: RRR. No JVD . Respiratory: CTA Bilaterally. Normal effort  GI: Bowel sounds are normal. Non distended. Musculoskeletal: She exhibits edema and tenderness RLE.   Neurological: She is alert.  Motor: RUE: limited by brace, finger flexors 3/5 LUE: 5/5 proximal to distal RLE: 3+/5 HF, KE, 2-/5 ADF/PF  LLE: HF 4/5, KE 4+/5, ADF/PF 2-/5 (continues to improve) HOH  Skin: Skin is warm and dry. Dressings and incision c/d/i  Psychiatric: Flat.   Assessment/Plan: 1. Functional deficits secondary to polytrauma which require 3+ hours per day of interdisciplinary therapy in a comprehensive inpatient  rehab setting. Physiatrist is providing close team supervision and 24 hour management of active medical problems listed below. Physiatrist and rehab team continue to assess barriers to discharge/monitor patient progress toward functional and medical goals.  Function:  Bathing Bathing position   Position: Wheelchair/chair at sink  Bathing parts Body parts bathed by patient: Right arm, Chest, Abdomen, Right upper leg, Left upper leg Body parts bathed by helper: Back, Left lower leg, Right lower leg, Left arm  Bathing assist Assist Level: (total A)      Upper Body Dressing/Undressing Upper body dressing   What is the patient wearing?: Button up shirt     Pull over shirt/dress - Perfomed by patient: Thread/unthread left sleeve, Pull shirt over trunk Pull over shirt/dress - Perfomed by helper: Thread/unthread right sleeve Button up shirt - Perfomed by patient: Thread/unthread right sleeve, Thread/unthread left sleeve, Pull shirt around back Button up shirt - Perfomed by helper: Button/unbutton shirt    Upper body assist Assist Level: 2 helpers      Lower Body Dressing/Undressing Lower body dressing   What is the patient wearing?: Non-skid slipper socks, Shoes, AFO, Ted Hose     Pants- Performed by patient: Thread/unthread right pants leg, Thread/unthread left pants leg, Pull pants up/down Pants- Performed by helper: Thread/unthread right pants leg, Thread/unthread left pants leg, Pull pants up/down   Non-skid slipper socks- Performed by helper: Don/doff right sock       Shoes - Performed  by helper: Don/doff left shoe AFO - Performed by patient: Don/doff left AFO     TED Hose - Performed by helper: Don/doff right TED hose, Don/doff left TED hose  Lower body assist Assist for lower body dressing: 2 Helpers      Toileting Toileting Toileting activity did not occur: Safety/medical concerns   Toileting steps completed by helper: Adjust clothing prior to toileting, Performs  perineal hygiene, Adjust clothing after toileting Toileting Assistive Devices: Other (comment)(purwick catheter in place)  Toileting assist Assist level: Two helpers   Transfers Chair/bed transfer Chair/bed transfer activity did not occur: N/A Chair/bed transfer method: Lateral scoot Chair/bed transfer assist level: Touching or steadying assistance (Pt > 75%) Chair/bed transfer assistive device: Sliding board, Armrests Mechanical lift: Maximove   Locomotion Ambulation Ambulation activity did not occur: Safety/medical concerns(NWB R LE/R UE)         Wheelchair Wheelchair activity did not occur: Refused(pt requesting to stay in bed secondary to painful disimpaction prior to therapy ) Type: Manual Max wheelchair distance: 150' Assist Level: Supervision or verbal cues  Cognition Comprehension Comprehension assist level: Follows complex conversation/direction with extra time/assistive device  Expression Expression assist level: Expresses complex ideas: With extra time/assistive device  Social Interaction Social Interaction assist level: Interacts appropriately with others with medication or extra time (anti-anxiety, antidepressant).  Problem Solving Problem solving assist level: Solves complex 90% of the time/cues < 10% of the time  Memory Memory assist level: Recognizes or recalls 75 - 89% of the time/requires cueing 10 - 24% of the time    Medical Problem List and Plan: 1.  Decreased functional mobility secondary to nondisplaced type II odontoid fracture/nondisplaced C2 fracture and right C4-cervical collar at all times. Right distal femur fracture/patella fracture with ORIF and closed treatment of patella fracture 02/16/2017 as well as right supracondylar humerus fracture with hinged elbow brace applied.    Cont CIR   Xrays reviewed, per Ortho cont NWB RUE/RLE, WBAT LLE 2.  DVT Prophylaxis/Anticoagulation: Eliquis 3. Pain Management: Lidoderm patch, OxyContin sustained release 10 mg  twice a day, oxycodone as needed   Voltaren gel added for right toe pain 4. Mood: Celexa 10 mg daily, increased on 12/6   Team to continue to provide ego support and positive reinforcement as possible   Reactive depression   Appreciate Neuropsych eval 5. Neuropsych: This patient is ?fully capable of making decisions on her own behalf. 6. Skin/Wound Care: Routine skin checks 7. Fluids/Electrolytes/Nutrition: Routine I&O's 8. Acute blood loss anemia.    Hb 10.9 on 12/7   Labs ordered for tomorrow  Cont to monitor 9. CAD/PAF/pacemaker 2015. Follow-up cardiology services needed. Patient is [facility] at [facility]. Cardiac exam 120 mg twice a day, clonidine 0.1 mg daily and 0.2 mg daily at bedtime, Lopressor 12.5 mg twice a day. Cardiac rate controlled 10. Hyperlipidemia. Crestor 11. GERD. Protonix 12. Hyponatremia   Na+ 134 on 12/7, improving   Labs ordered for tomorrow   Cont to monitor 13. Hypoalbuminemia   Supplement initiated 11/20 14. Leukocytosis: Resolved    Cont to monitor   Afebrile 15. HTN   HCTZ d/ced on 11/27 due to abnormal electrolytes    Vitals:   03/17/17 2048 03/18/17 0319  BP: 132/86 133/76  Pulse: 86 79  Resp: 18 16  Temp: 97.9 F (36.6 C) 97.7 F (36.5 C)  SpO2: 97% 95%     Controlled on 12/12 16. Bladder incontinence   Improved with hearing aid   Strong cognitive component  17. Hypokalemia: Resolved   Supplemented 18. Acute lower UTI   UA+, Ucx with multiple species x2   Empiric Macrobid started on 12/5-12/11 19. Gas   Simethicone PRN ordered on 12/10   Improved    LOS (Days) 23 A FACE TO FACE EVALUATION WAS PERFORMED  Ankit Lorie Phenix 03/18/2017 9:22 AM

## 2017-03-19 ENCOUNTER — Inpatient Hospital Stay (HOSPITAL_COMMUNITY): Payer: Medicare Other | Admitting: Occupational Therapy

## 2017-03-19 LAB — CBC WITH DIFFERENTIAL/PLATELET
BASOS ABS: 0.1 10*3/uL (ref 0.0–0.1)
BASOS PCT: 1 %
EOS ABS: 0.3 10*3/uL (ref 0.0–0.7)
Eosinophils Relative: 2 %
HEMATOCRIT: 40 % (ref 36.0–46.0)
HEMOGLOBIN: 13 g/dL (ref 12.0–15.0)
Lymphocytes Relative: 12 %
Lymphs Abs: 1.3 10*3/uL (ref 0.7–4.0)
MCH: 29.9 pg (ref 26.0–34.0)
MCHC: 32.5 g/dL (ref 30.0–36.0)
MCV: 92 fL (ref 78.0–100.0)
Monocytes Absolute: 1.4 10*3/uL — ABNORMAL HIGH (ref 0.1–1.0)
Monocytes Relative: 12 %
NEUTROS ABS: 8.2 10*3/uL — AB (ref 1.7–7.7)
NEUTROS PCT: 73 %
Platelets: 361 10*3/uL (ref 150–400)
RBC: 4.35 MIL/uL (ref 3.87–5.11)
RDW: 14.6 % (ref 11.5–15.5)
WBC: 11.2 10*3/uL — AB (ref 4.0–10.5)

## 2017-03-19 LAB — BASIC METABOLIC PANEL
ANION GAP: 11 (ref 5–15)
BUN: 9 mg/dL (ref 6–20)
CALCIUM: 9.1 mg/dL (ref 8.9–10.3)
CO2: 23 mmol/L (ref 22–32)
Chloride: 100 mmol/L — ABNORMAL LOW (ref 101–111)
Creatinine, Ser: 0.74 mg/dL (ref 0.44–1.00)
Glucose, Bld: 127 mg/dL — ABNORMAL HIGH (ref 65–99)
Potassium: 3.2 mmol/L — ABNORMAL LOW (ref 3.5–5.1)
SODIUM: 134 mmol/L — AB (ref 135–145)

## 2017-03-19 NOTE — Progress Notes (Signed)
Pt discharged via stretcher to [facility] with transporters. Half of patient belongings sent with her (transport would only take 2 bags) and non-essential belongings were put in [employer] [place] office for later family pick up. VSS. BP 139/72 (BP Location: Left Arm)   Pulse 79   Temp 98.8 F (37.1 C) (Oral)   Resp 17   Ht 5\' 1"  (1.549 m)   Wt 61 kg (134 lb 7.7 oz)   SpO2 97%   BMI 25.41 kg/m

## 2017-03-19 NOTE — Progress Notes (Signed)
[place] PHYSICAL MEDICINE & REHABILITATION     PROGRESS NOTE  Subjective/Complaints:  Patient seen lying in bed this morning. She states she slept great overnight. She has questions about her fractures and follow-up appointments. She is ready for discharge.  ROS: Denies nausea, vomiting, diarrhea, shortness of breath or chest pain   Objective: Vital Signs: Blood pressure 124/78, pulse 81, temperature 98.9 F (37.2 C), temperature source Oral, resp. rate 16, height 5\' 1"  (1.549 m), weight 61 kg (134 lb 7.7 oz), SpO2 98 %. No results found. Recent Labs    03/19/17 0744  WBC 11.2*  HGB 13.0  HCT 40.0  PLT 361   Recent Labs    03/19/17 0744  NA 134*  K 3.2*  CL 100*  GLUCOSE 127*  BUN 9  CREATININE 0.74  CALCIUM 9.1   CBG (last 3)  No results for input(s): GLUCAP in the last 72 hours.  Wt Readings from Last 3 Encounters:  03/12/17 61 kg (134 lb 7.7 oz)  02/15/17 59.9 kg (132 lb)  11/18/16 56.7 kg (125 lb)    Physical Exam:  BP 124/78 (BP Location: Left Arm)   Pulse 81   Temp 98.9 F (37.2 C) (Oral)   Resp 16   Ht 5\' 1"  (1.549 m)   Wt 61 kg (134 lb 7.7 oz)   SpO2 98%   BMI 25.41 kg/m  Constitutional: She appears well-developed and well-nourished. NAD. HENT: Normocephalic and atraumatic.  Eyes: EOM are normal. No discharge.  Neck: +C-collar in place  Cardiovascular: RRR. No JVD . Respiratory: CTA Bilaterally. Normal effort  GI: Bowel sounds are normal. Non distended. Musculoskeletal: She exhibits edema and tenderness RLE.   Neurological: She is alert.  Motor: RUE: limited by brace, finger flexors 3/5 LUE: 5/5 proximal to distal RLE: 3+/5 HF, KE, 2-/5 ADF/PF  LLE: HF 4/5, KE 4+/5, ADF/PF 2-/5 (improving) HOH  Skin: Skin is warm and dry. Dressings and incision c/d/i  Psychiatric: Flat.   Assessment/Plan: 1. Functional deficits secondary to polytrauma which require 3+ hours per day of interdisciplinary therapy in a comprehensive inpatient rehab  setting. Physiatrist is providing close team supervision and 24 hour management of active medical problems listed below. Physiatrist and rehab team continue to assess barriers to discharge/monitor patient progress toward functional and medical goals.  Function:  Bathing Bathing position   Position: Sitting EOB  Bathing parts Body parts bathed by patient: Right arm, Chest, Abdomen, Right upper leg, Left upper leg Body parts bathed by helper: Back, Left lower leg, Right lower leg, Left arm, Front perineal area, Buttocks  Bathing assist Assist Level: (total A)      Upper Body Dressing/Undressing Upper body dressing   What is the patient wearing?: Button up shirt     Pull over shirt/dress - Perfomed by patient: Thread/unthread left sleeve, Thread/unthread right sleeve, Put head through opening Pull over shirt/dress - Perfomed by helper: Pull shirt over trunk Button up shirt - Perfomed by patient: Thread/unthread right sleeve, Thread/unthread left sleeve, Pull shirt around back Button up shirt - Perfomed by helper: Button/unbutton shirt    Upper body assist Assist Level: 2 helpers      Lower Body Dressing/Undressing Lower body dressing   What is the patient wearing?: Shoes, AFO, Ted Hose, Pants     Pants- Performed by patient: Thread/unthread right pants leg, Thread/unthread left pants leg, Pull pants up/down Pants- Performed by helper: Thread/unthread right pants leg, Thread/unthread left pants leg, Pull pants up/down   Non-skid slipper  socks- Performed by helper: Don/doff right sock       Shoes - Performed by helper: Don/doff left shoe AFO - Performed by patient: Don/doff left AFO     TED Hose - Performed by helper: Don/doff right TED hose, Don/doff left TED hose  Lower body assist Assist for lower body dressing: 2 Helpers      Toileting Toileting Toileting activity did not occur: Safety/medical concerns   Toileting steps completed by helper: Adjust clothing prior to  toileting, Performs perineal hygiene, Adjust clothing after toileting Toileting Assistive Devices: Other (comment)(purwick catheter in place)  Toileting assist Assist level: Two helpers   Transfers Chair/bed transfer Chair/bed transfer activity did not occur: N/A Chair/bed transfer method: Lateral scoot Chair/bed transfer assist level: Touching or steadying assistance (Pt > 75%) Chair/bed transfer assistive device: Sliding board, Armrests Mechanical lift: Maximove   Locomotion Ambulation Ambulation activity did not occur: Safety/medical concerns(NWB R UE/LE)         Wheelchair Wheelchair activity did not occur: Refused(pt requesting to stay in bed secondary to painful disimpaction prior to therapy ) Type: Manual Max wheelchair distance: 150' Assist Level: Supervision or verbal cues  Cognition Comprehension Comprehension assist level: Follows complex conversation/direction with extra time/assistive device  Expression Expression assist level: Expresses complex ideas: With extra time/assistive device  Social Interaction Social Interaction assist level: Interacts appropriately with others with medication or extra time (anti-anxiety, antidepressant).  Problem Solving Problem solving assist level: Solves complex 90% of the time/cues < 10% of the time  Memory Memory assist level: Recognizes or recalls 75 - 89% of the time/requires cueing 10 - 24% of the time    Medical Problem List and Plan: 1.  Decreased functional mobility secondary to nondisplaced type II odontoid fracture/nondisplaced C2 fracture and right C4-cervical collar at all times. Right distal femur fracture/patella fracture with ORIF and closed treatment of patella fracture 02/16/2017 as well as right supracondylar humerus fracture with hinged elbow brace applied.    DC today to SNF   Will see patient in one month for hospital follow-up   Xrays reviewed, per Ortho cont NWB RUE/RLE, WBAT LLE 2.  DVT Prophylaxis/Anticoagulation:  Eliquis 3. Pain Management: Lidoderm patch, OxyContin sustained release 10 mg twice a day, oxycodone as needed   Voltaren gel added for right toe pain, improving 4. Mood: Celexa 10 mg daily, increased on 12/6   Team to continue to provide ego support and positive reinforcement as possible   Reactive depression   Appreciate Neuropsych eval 5. Neuropsych: This patient is ?fully capable of making decisions on her own behalf. 6. Skin/Wound Care: Routine skin checks 7. Fluids/Electrolytes/Nutrition: Routine I&O's 8. Acute blood loss anemia: Resolved    Hb 13.0 on 12/13  Cont to monitor 9. CAD/PAF/pacemaker 2015. Follow-up cardiology services needed. Patient is followed at [facility]. Cardiac exam 120 mg twice a day, clonidine 0.1 mg daily and 0.2 mg daily at bedtime, Lopressor 12.5 mg twice a day. Cardiac rate controlled 10. Hyperlipidemia. Crestor 11. GERD. Protonix 12. Hyponatremia   Na+ 134 on 12/13, stable   Cont to monitor 13. Hypoalbuminemia   Supplement initiated 11/20 14. Leukocytosis: Resolved    Cont to monitor   Afebrile 15. HTN   HCTZ d/ced on 11/27 due to abnormal electrolytes    Vitals:   03/18/17 1948 03/19/17 0456  BP: 121/77 124/78  Pulse: 86 81  Resp: 18 16  Temp:  98.9 F (37.2 C)  SpO2: 97% 98%     Controlled on 12/13  16. Bladder incontinence   Improved with hearing aid   Strong cognitive component  17. Hypokalemia: Resolved   Supplemented 18. Acute lower UTI   UA+, Ucx with multiple species x2   Empiric Macrobid completed 12/5-12/11 19. Gas   Simethicone PRN ordered on 12/10   Improved    LOS (Days) 24 A FACE TO FACE EVALUATION WAS PERFORMED  Karina [name] Lorie Phenix 03/19/2017 9:30 AM

## 2017-03-19 NOTE — Progress Notes (Signed)
Report called to Maren Beach, nurse at [facility].
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
## 2017-03-19 NOTE — Progress Notes (Signed)
Social Work Patient ID: Karina [name], female   DOB: May 17, 1922, 83 y.o.   MRN: [id number]   CSW met with pt and has talked with pt's son multiple times this week, including after conference.  Pt is medically stable for d/c and has met therapy goals.  Pt to transfer to SNF and has bed offers.  She is working with her son to make a decision, but pt will be transferred to SNF today.  CSW will facilitate this transfer via non-emergent ambulance once facility is selected and notified.

## 2017-03-20 NOTE — Progress Notes (Signed)
Social Work Discharge Note  The overall goal for the admission was met for:   Discharge location: No - [place] in [place], [place]  Length of Stay: Yes - 24 days  Discharge activity level: Yes - min ot mod assist - weight bearing status prevented pt from moving past that functional level  Home/community participation: No  Services provided included: MD, RD, PT, OT, RN, CM, Pharmacy, Neuropsych and [place]: Private Insurance: [employer]  Follow-up services arranged: Other: Pt to go for short term SNF prior to returning home with caregivers.  Comments (or additional information): Pt transferred to SNF via PTAR.  Son and friends are aware of pt's tx.  Pt/son appreciative of CIR care.  Patient/Family verbalized understanding of follow-up arrangements: Yes  Individual responsible for coordination of the follow-up plan: pt, son, and SNF  Confirmed correct DME delivered: [name] 03/20/2017    [name], [name]

## 2017-04-22 ENCOUNTER — Inpatient Hospital Stay: Payer: Medicare Other | Admitting: Physical Medicine & Rehabilitation

## 2017-05-22 ENCOUNTER — Encounter: Payer: Medicare Other | Admitting: *Deleted

## 2017-05-27 ENCOUNTER — Encounter: Payer: Self-pay | Admitting: Cardiology

## 2017-06-15 IMAGING — DX DG CHEST 2V
2 series · 2 of 2 positions shown · non-contrast
Comparison: None.

CLINICAL DATA: 89-year-old female with nausea and abdominal pain

EXAM:
CHEST  2 VIEW

[chest pa]
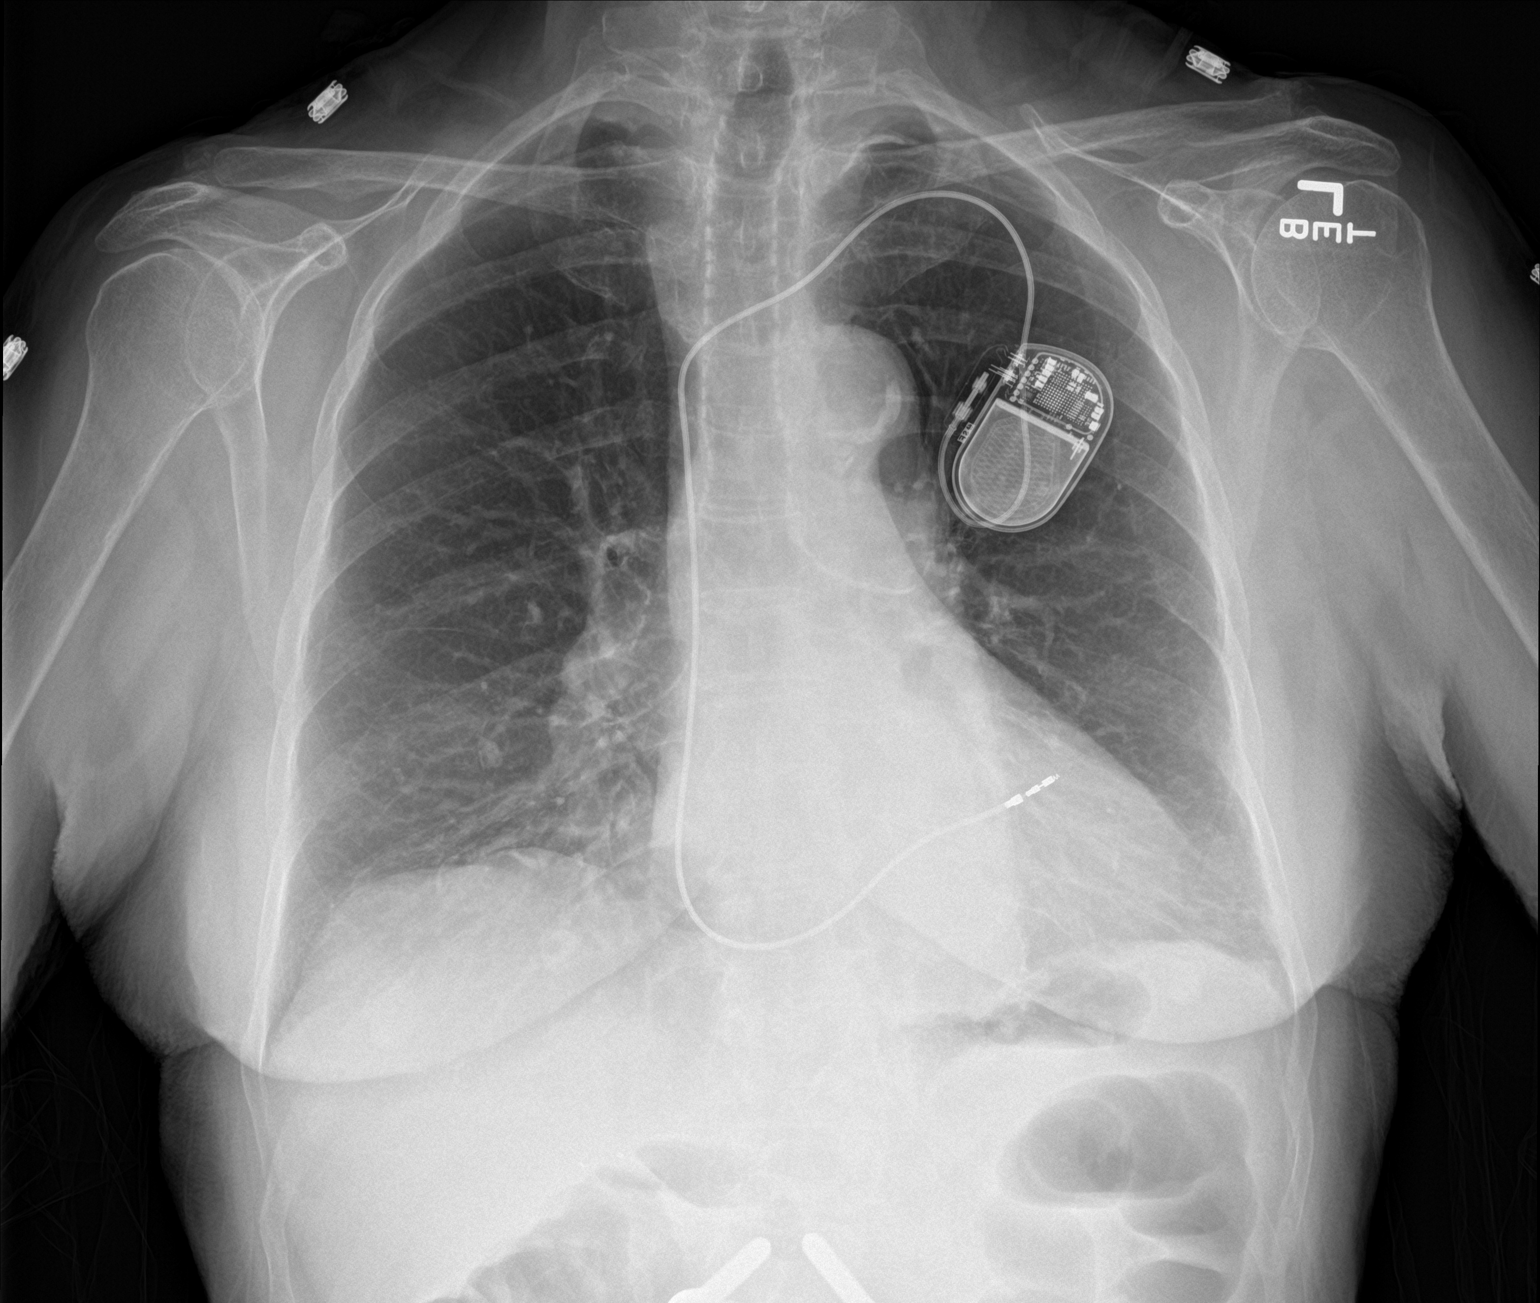

[chest lat]
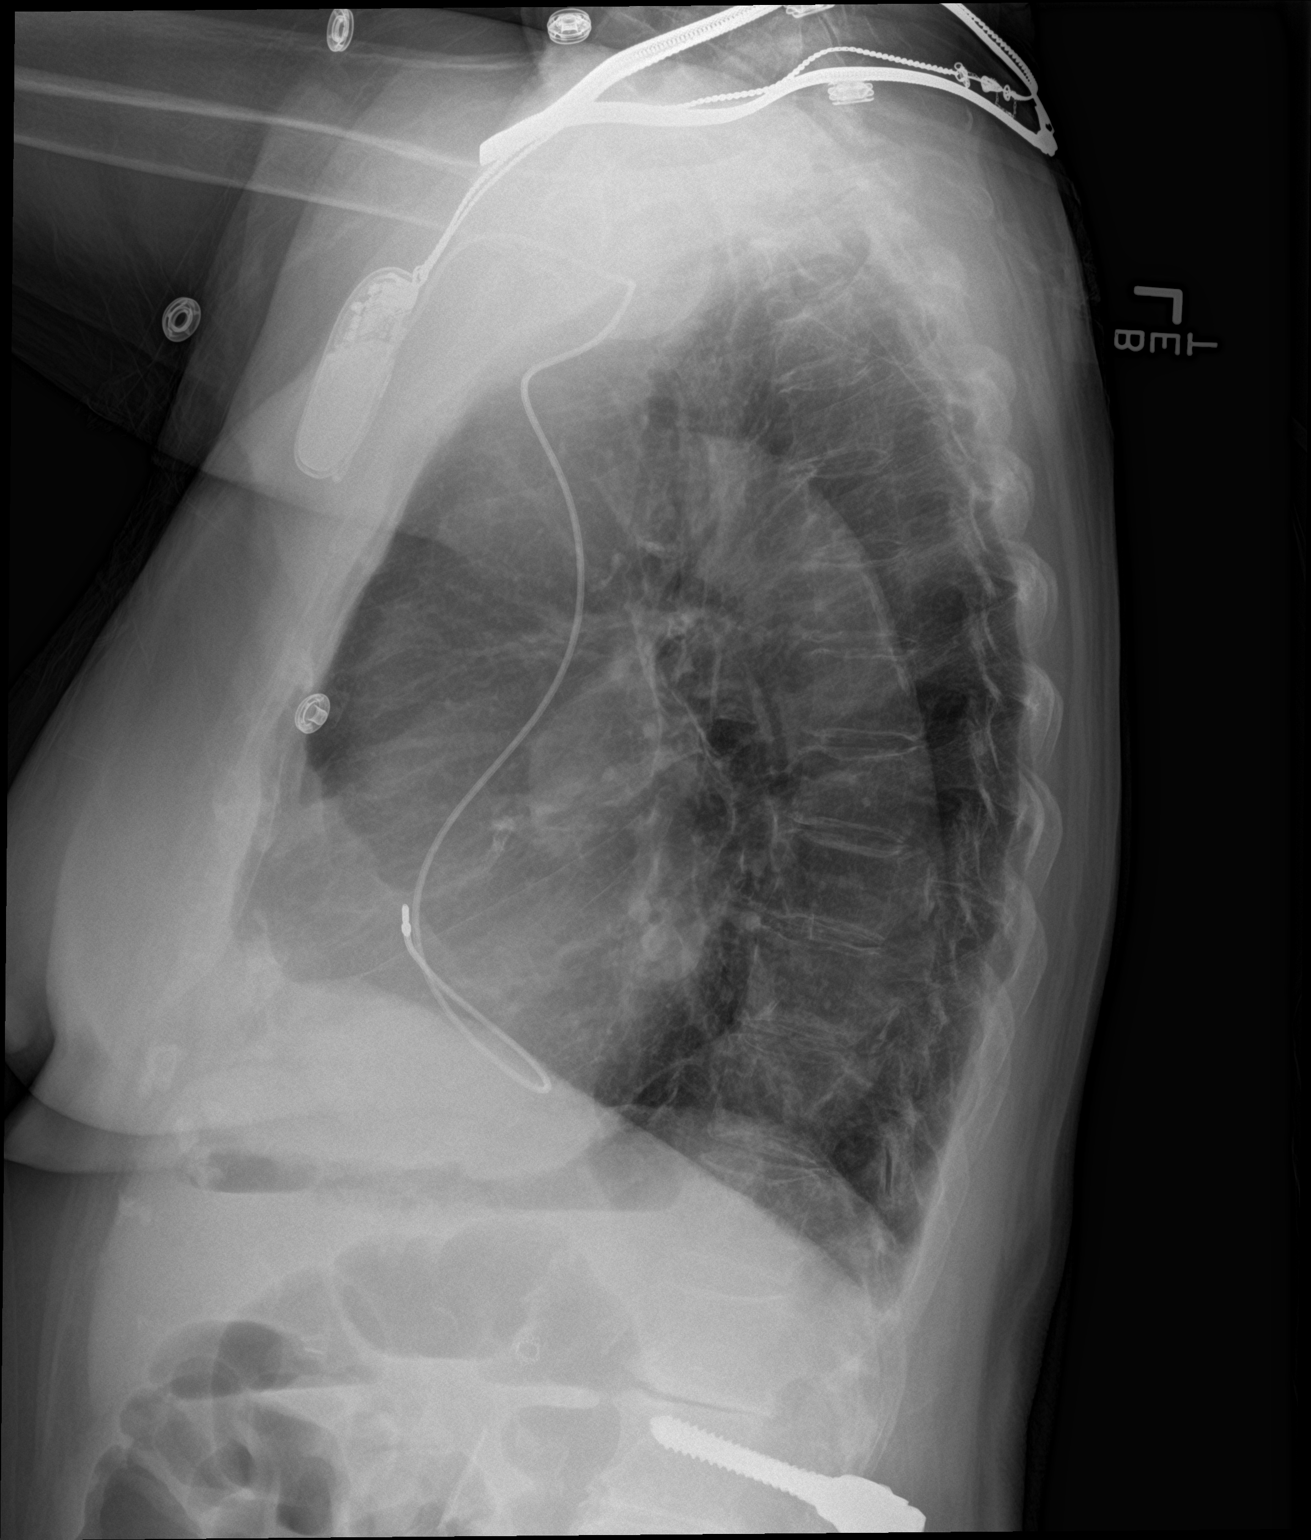

[2 of 2 positions shown; findings below may reference images not displayed]

FINDINGS: The heart size and mediastinal contours are within normal limits.
Left pectoral pacemaker device. Both lungs are clear. The visualized
skeletal structures are unremarkable.
IMPRESSION: No active cardiopulmonary disease.

## 2017-07-01 ENCOUNTER — Encounter: Payer: Medicare Other | Admitting: Internal Medicine

## 2017-07-01 DIAGNOSIS — R0989 Other specified symptoms and signs involving the circulatory and respiratory systems: Secondary | ICD-10-CM

## 2017-07-02 ENCOUNTER — Encounter: Payer: Self-pay | Admitting: Internal Medicine

## 2017-07-29 ENCOUNTER — Encounter (HOSPITAL_COMMUNITY): Payer: Self-pay | Admitting: Emergency Medicine

## 2017-07-29 ENCOUNTER — Other Ambulatory Visit: Payer: Self-pay

## 2017-07-29 ENCOUNTER — Emergency Department (HOSPITAL_COMMUNITY)
Admission: EM | Admit: 2017-07-29 | Discharge: 2017-07-29 | Disposition: A | Discharge status: Home / Self Care | Payer: Medicare Other | Attending: Emergency Medicine | Admitting: Emergency Medicine

## 2017-07-29 DIAGNOSIS — Z96641 Presence of right artificial hip joint: Secondary | ICD-10-CM | POA: Diagnosis not present

## 2017-07-29 DIAGNOSIS — Z79899 Other long term (current) drug therapy: Secondary | ICD-10-CM | POA: Diagnosis not present

## 2017-07-29 DIAGNOSIS — Z950 Presence of cardiac pacemaker: Secondary | ICD-10-CM | POA: Diagnosis not present

## 2017-07-29 DIAGNOSIS — R531 Weakness: Secondary | ICD-10-CM | POA: Insufficient documentation

## 2017-07-29 DIAGNOSIS — Z955 Presence of coronary angioplasty implant and graft: Secondary | ICD-10-CM | POA: Insufficient documentation

## 2017-07-29 DIAGNOSIS — E1140 Type 2 diabetes mellitus with diabetic neuropathy, unspecified: Secondary | ICD-10-CM | POA: Insufficient documentation

## 2017-07-29 DIAGNOSIS — I2510 Atherosclerotic heart disease of native coronary artery without angina pectoris: Secondary | ICD-10-CM | POA: Diagnosis not present

## 2017-07-29 DIAGNOSIS — I10 Essential (primary) hypertension: Secondary | ICD-10-CM | POA: Insufficient documentation

## 2017-07-29 DIAGNOSIS — Z8673 Personal history of transient ischemic attack (TIA), and cerebral infarction without residual deficits: Secondary | ICD-10-CM | POA: Diagnosis not present

## 2017-07-29 DIAGNOSIS — Z7901 Long term (current) use of anticoagulants: Secondary | ICD-10-CM | POA: Diagnosis not present

## 2017-07-29 DIAGNOSIS — Z8542 Personal history of malignant neoplasm of other parts of uterus: Secondary | ICD-10-CM | POA: Insufficient documentation

## 2017-07-29 LAB — CBC
HCT: 39 % (ref 36.0–46.0)
HEMOGLOBIN: 12.8 g/dL (ref 12.0–15.0)
MCH: 28.1 pg (ref 26.0–34.0)
MCHC: 32.8 g/dL (ref 30.0–36.0)
MCV: 85.7 fL (ref 78.0–100.0)
Platelets: 288 10*3/uL (ref 150–400)
RBC: 4.55 MIL/uL (ref 3.87–5.11)
RDW: 15.7 % — ABNORMAL HIGH (ref 11.5–15.5)
WBC: 9.7 10*3/uL (ref 4.0–10.5)

## 2017-07-29 LAB — BASIC METABOLIC PANEL
ANION GAP: 11 (ref 5–15)
BUN: 15 mg/dL (ref 6–20)
CO2: 23 mmol/L (ref 22–32)
Calcium: 9.3 mg/dL (ref 8.9–10.3)
Chloride: 95 mmol/L — ABNORMAL LOW (ref 101–111)
Creatinine, Ser: 0.79 mg/dL (ref 0.44–1.00)
GFR calc Af Amer: >60 mL/min (ref >60)
GLUCOSE: 114 mg/dL — AB (ref 65–99)
Potassium: 4.1 mmol/L (ref 3.5–5.1)
Sodium: 129 mmol/L — ABNORMAL LOW (ref 135–145)

## 2017-07-29 LAB — URINALYSIS, ROUTINE W REFLEX MICROSCOPIC
BILIRUBIN URINE: NEGATIVE
Glucose, UA: NEGATIVE mg/dL
Hgb urine dipstick: NEGATIVE
Ketones, ur: NEGATIVE mg/dL
Leukocytes, UA: NEGATIVE
NITRITE: NEGATIVE
PH: 7 (ref 5.0–8.0)
Protein, ur: NEGATIVE mg/dL
SPECIFIC GRAVITY, URINE: 1.01 (ref 1.005–1.030)

## 2017-07-29 LAB — CBG MONITORING, ED: Glucose-Capillary: 111 mg/dL — ABNORMAL HIGH (ref 65–99)

## 2017-07-29 LAB — TROPONIN I

## 2017-07-29 MED ORDER — SODIUM CHLORIDE 0.9 % IV BOLUS
1000.0000 mL | Freq: Once | INTRAVENOUS | Status: AC
  Administered 2017-07-29: 1000 mL via INTRAVENOUS

## 2017-07-29 NOTE — ED Triage Notes (Signed)
Pt c/o of weakness in both legs x 2 days. States " I just feel weak and my legs give out and my BP has been running high"  Grip strength equal
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
## 2017-07-29 NOTE — Discharge Instructions (Addendum)
Tests showed no life-threatening condition.  No restrictions on your activity.  Follow-up with your primary care doctor or return if worse.

## 2017-08-05 ENCOUNTER — Telehealth: Payer: Self-pay | Admitting: *Deleted
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
## 2017-08-06 NOTE — Telephone Encounter (Signed)
Spoke with Karina [name] regarding home monitor she stated that someone just plugged it in this morning beside her bed informed Karina [name] that it should pick her up tonight, Karina [name] requested that someone call her tomorrow regarding if the home monitor worked over the night.

## 2017-08-06 NOTE — Telephone Encounter (Signed)
Mrs. Cabriales called the [facility] requesting to speak with [facility] in regards to a telephone call from 08/05/2017.

## 2017-08-10 NOTE — Telephone Encounter (Signed)
Home monitor still not connected - need to troubleshoot with tech services

## 2017-08-11 NOTE — Telephone Encounter (Signed)
Attempted to call patient regarding her home monitor. Caregiver states that she's gone to therapy. Will call again later.

## 2017-08-12 NOTE — Telephone Encounter (Signed)
Spoke with patient and gave [employer] tech services number for assistance in troubleshooting patients home monitor.

## 2017-09-30 ENCOUNTER — Encounter: Payer: Medicare Other | Admitting: *Deleted

## 2017-11-16 ENCOUNTER — Encounter

## 2017-11-16 ENCOUNTER — Ambulatory Visit: Payer: Medicare Other | Admitting: Internal Medicine

## 2017-11-16 ENCOUNTER — Encounter: Payer: Self-pay | Admitting: Internal Medicine

## 2017-11-16 VITALS — BP 104/66 | HR 82 | Ht 61.5 in | Wt 129.4 lb

## 2017-11-16 DIAGNOSIS — I482 Chronic atrial fibrillation, unspecified: Secondary | ICD-10-CM

## 2017-11-16 LAB — CUP PACEART INCLINIC DEVICE CHECK
Battery Remaining Longevity: 111 mo
Battery Remaining Percentage: 75 %
Date Time Interrogation Session: 20190812130200
Implantable Lead Serial Number: 29601447
Lead Channel Impedance Value: 604 Ohm
Lead Channel Pacing Threshold Amplitude: 0.7 V
Lead Channel Sensing Intrinsic Amplitude: 12.6 mV
Lead Channel Sensing Intrinsic Amplitude: 12.6 mV
Lead Channel Setting Pacing Amplitude: 1.3 V
Lead Channel Setting Pacing Pulse Width: 0.4 ms
MDC IDC LEAD IMPLANT DT: 20150413
MDC IDC LEAD LOCATION: 753860
MDC IDC MSMT LEADCHNL RV PACING THRESHOLD AMPLITUDE: 0.7 V
MDC IDC MSMT LEADCHNL RV PACING THRESHOLD PULSEWIDTH: 0.4 ms
MDC IDC MSMT LEADCHNL RV PACING THRESHOLD PULSEWIDTH: 0.4 ms
MDC IDC PG IMPLANT DT: 20150413
MDC IDC PG SERIAL: 68133668
MDC IDC SET LEADCHNL RV SENSING SENSITIVITY: 2.5 mV

## 2017-11-16 NOTE — Progress Notes (Signed)
HPI Karina [name] returns today for ongoing treatment of her atrial fibrillation. She is a very pleasant 84 yo woman with a h/o atrial fibrillation, symptomatic bradycardia, CAD, s/p PTCA and stenting of her LAD remotely, who developed atrial fibrillation a couple of years ago which has no become chronic. She was tried on both Tikosyn and amiodarone without success and is now on a strategy of rate control. She denies peripheral edema or syncope. She has peripheral neuropathy. She had a Biotronik DDD PM placed. In the interim, she has had some problems with her peripheral neuropathy and has become a bit less active. Allergies  Allergen Reactions  . Ace Inhibitors Hives  . Amiodarone Other (See Comments)    Worsening peripheral neuropathy  . Tikosyn [Dofetilide] Other (See Comments)    QT prolongation  . Meperidine And Related Other (See Comments)    Unknown     Current Outpatient Medications  Medication Sig Dispense Refill  . apixaban (ELIQUIS) 2.5 MG TABS tablet Take 1 tablet (2.5 mg total) 2 (two) times daily by mouth. 60 tablet 1  . cloNIDine (CATAPRES) 0.1 MG tablet Take 0.1-0.2 mg by mouth 2 (two) times daily. Take 1 tablet in the morning and 2 tablets at night    . diltiazem (CARDIZEM CD) 120 MG 24 hr capsule Take 120 mg by mouth 2 (two) times daily.    . furosemide (LASIX) 20 MG tablet Take 20 mg by mouth daily as needed.  0  . hydrochlorothiazide (MICROZIDE) 12.5 MG capsule Take 12.5 mg by mouth 2 (two) times daily.    Marland Kitchen loratadine (CLARITIN) 10 MG tablet Take 10 mg by mouth daily.  5  . metoprolol succinate (TOPROL-XL) 50 MG 24 hr tablet Take 50 mg by mouth 2 (two) times daily. Take with or immediately following a meal.    . mirtazapine (REMERON SOL-TAB) 15 MG disintegrating tablet Take 1 tablet (15 mg total) by mouth at bedtime.    . nitroGLYCERIN (NITROSTAT) 0.4 MG SL tablet Place 0.4 mg under the tongue every 5 (five) minutes as needed for chest pain.     .  pantoprazole (PROTONIX) 40 MG tablet Take 40 mg by mouth daily.    . potassium chloride (K-DUR,KLOR-CON) 10 MEQ tablet Take 10 mEq by mouth daily as needed.     . rosuvastatin (CRESTOR) 5 MG tablet Take 5 mg by mouth daily.     . traMADol (ULTRAM) 50 MG tablet Take 50 mg by mouth every 6 (six) hours as needed for moderate pain.      No current facility-administered medications for this visit.      Past Medical History:  Diagnosis Date  . Actinic keratosis   . Bilateral leg weakness 09/27/12  . CAD (coronary artery disease)    EF 55% by 2 D Echo 2008 and 2012  . Chronic atrial fibrillation ([place])   . Chronic edema 10/15/11  . Chronic fatigue   . DDD (degenerative disc disease)   . Encounter for long-term (current) use of other medications 09/02/12  . Endometrial carcinoma ([place])   . Fibromuscular dysplasia (HCC)    S/P PCI right renal aretery 1999  . GERD (gastroesophageal reflux disease)   . Hip fracture, right ([place]) 08/06/06   S/P surgery  . Hx of cardiovascular stress test 09/2010   Nuclear stress testing showing no evidence of ischemia, EF=76%, No EKG changes & no perfusion defects.  Marland Kitchen Hx of echocardiogram 05/2012    EF >55%, LA 3.9  cm, mild LVH  . Hyperlipidemia   . Hypertension    Difficult to control  . Hyponatremia    Hypomatremia/SIADH, possibly secondary to Tegretol  . LVH (left ventricular hypertrophy)    Mild  . Osteoarthritis   . Osteoporosis    With Hx of thoracic compression fractures  . Pacemaker   . Paroxysmal atrial fibrillation (HCC)   . Peripheral neuropathy   . Peripheral vascular disease ([place])   . Pre-diabetes 01/21/11   Chronic  . Renovascular hypertension   . Tachy-brady syndrome ([place])   . Thoracic compression fracture ([place])   . TIA (transient ischemic attack) 08/2011   OSH: flushing, left LE numbness, CT negative  . Tic dou[place]oureux 1994   S/P successful surgery    ROS:   All systems reviewed and negative except as noted in the HPI.   Past  Surgical History:  Procedure Laterality Date  . ABDOMINAL AORTAGRAM  09/05/01   Right & left renals 25%  . APPENDECTOMY  1946  . CARDIAC CATHETERIZATION  11/05/01   EF 72%. RCA 25%. LCX 50%. Ramus 75/100%. LAD 95%. D2 75%.  . [place]  . [place]  . CHOLECYSTECTOMY  1989  . CORONARY ANGIOPLASTY    . CORONARY ANGIOPLASTY WITH STENT PLACEMENT  11/05/01   PTCA/Stent to 95% mid LAD, Ramus lesion could not be crossed and was not treated.   Marland Kitchen DIPYRIDAMOLE STRESS TEST  09/2010   Nuclear stress testing showing no evidence of ischemia, EF=76%, No EKG changes & no perfusion defects.  . LUMBAR [place]    . ORIF FEMUR FRACTURE Right 02/16/2017   Procedure: OPEN REDUCTION INTERNAL FIXATION (ORIF) DISTAL FEMUR FRACTURE;  Surgeon: Shona Needles, MD;  Location: [facility];  Service: Orthopedics;  Laterality: Right;  . RENAL ANGIOGRAM  09/14/97   25% diffuse left renal artery. 50% ostia; right renal artery.  Marland Kitchen RENAL ARTERY ANGIOPLASTY Right 1994   Renal artery stenosis secondary to fibromusclar dysplasia  . RENAL ARTERY ANGIOPLASTY Right 09/13/97   PTA/Stent to ostial right renal artery, No distal fibromuscular hyperplasia  . SPINAL FUSION  2002   Percutaneous spinal fusion  . TOTAL ABDOMINAL HYSTERECTOMY W/ BILATERAL SALPINGOOPHORECTOMY  1990  . VAGINAL HYSTERECTOMY  1990     Family History  Problem Relation Age of Onset  . COPD Brother   . Diabetes Brother        DM  . Hypertension Brother   . Stroke Father   . Stroke Other      Social History   Socioeconomic History  . Marital status: Widowed    Spouse name: Not on file  . Number of children: Not on file  . Years of education: Not on file  . Highest education level: Not on file  Occupational History  . Not on file  Social Needs  . Financial resource strain: Not on file  . Food insecurity:    Worry: Not on file    Inability: Not on file  . Transportation needs:    Medical: Not on file    Non-medical:  Not on file  Tobacco Use  . Smoking status: Never Smoker  . Smokeless tobacco: Never Used  Substance and Sexual Activity  . Alcohol use: Yes    Alcohol/week: 0.0 standard drinks    Comment: Rarely  . Drug use: No  . Sexual activity: Not Currently  Lifestyle  . Physical activity:    Days per week: Not on file  Minutes per session: Not on file  . Stress: Not on file  Relationships  . Social connections:    Talks on phone: Not on file    Gets together: Not on file    Attends religious service: Not on file    Active member of club or organization: Not on file    Attends meetings of clubs or organizations: Not on file    Relationship status: Not on file  . Intimate partner violence:    Fear of current or ex partner: Not on file    Emotionally abused: Not on file    Physically abused: Not on file    Forced sexual activity: Not on file  Other Topics Concern  . Not on file  Social History Narrative  . Not on file     BP 104/66   Pulse 82   Ht 5' 1.5" (1.562 m)   Wt 129 lb 6.4 oz (58.7 kg)   SpO2 96% Comment: on room air  BMI 24.05 kg/m   Physical Exam:  Well appearing NAD HEENT: Unremarkable Neck:  No JVD, no thyromegally Lymphatics:  No adenopathy Back:  No CVA tenderness Lungs:  Clear HEART:  Regular rate rhythm, no murmurs, no rubs, no clicks Abd:  soft, positive bowel sounds, no organomegally, no rebound, no guarding Ext:  2 plus pulses, no edema, no cyanosis, no clubbing Skin:  No rashes no nodules Neuro:  CN II through XII intact, motor grossly intact  EKG none  DEVICE  Normal device function.  See PaceArt for details.   Assess/Plan: 1. Atrial fib - her ventricular rate is well controlled.  2. CHB - she is pacing over 90% of the time. She is asymptomatic. 3. CAD - she denies anginal symptoms. She will continue her current meds. 4. HTN - her blood pressure is well controlled today. She will continue her current meds.   Mikle Bosworth.D.

## 2017-11-16 NOTE — Patient Instructions (Signed)
Medication Instructions:  Your physician recommends that you continue on your current medications as directed. Please refer to the Current Medication list given to you today.   Labwork: NONE   Testing/Procedures: NONE   Follow-Up: Your physician wants you to follow-up in: 02/15/18 You will receive a reminder letter in the mail two months in advance. If you don't receive a letter, please call our office to schedule the follow-up appointment.   Any Other Special Instructions Will Be Listed Below (If Applicable).     If you need a refill on your cardiac medications before your next appointment, please call your pharmacy. Thank you for choosing [facility]!

## 2018-01-16 ENCOUNTER — Other Ambulatory Visit: Payer: Self-pay

## 2018-01-16 ENCOUNTER — Emergency Department (HOSPITAL_COMMUNITY)
Admission: EM | Admit: 2018-01-16 | Discharge: 2018-01-16 | Disposition: A | Discharge status: Home / Self Care | Payer: Medicare Other | Attending: Emergency Medicine | Admitting: Emergency Medicine

## 2018-01-16 ENCOUNTER — Encounter (HOSPITAL_COMMUNITY): Payer: Self-pay | Admitting: Emergency Medicine

## 2018-01-16 DIAGNOSIS — I10 Essential (primary) hypertension: Secondary | ICD-10-CM | POA: Insufficient documentation

## 2018-01-16 DIAGNOSIS — Z7901 Long term (current) use of anticoagulants: Secondary | ICD-10-CM | POA: Diagnosis not present

## 2018-01-16 DIAGNOSIS — Z79899 Other long term (current) drug therapy: Secondary | ICD-10-CM | POA: Diagnosis not present

## 2018-01-16 DIAGNOSIS — Z8673 Personal history of transient ischemic attack (TIA), and cerebral infarction without residual deficits: Secondary | ICD-10-CM | POA: Insufficient documentation

## 2018-01-16 DIAGNOSIS — I2510 Atherosclerotic heart disease of native coronary artery without angina pectoris: Secondary | ICD-10-CM | POA: Insufficient documentation
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
## 2018-01-16 NOTE — ED Triage Notes (Signed)
Patient brought in via EMS from home. Alert and oriented. Patient c/o hypertension in which she was seen at [name] last week for,metoprolol increased to 50mg . Patient denies any improvement. Per patient states "don't feel well, generalized weakness, and right side headache." Patient's blood pressure in route to ED was 182/126 and 171/108. Denies any chest pain.

## 2018-01-16 NOTE — Discharge Instructions (Addendum)
Your blood pressure has normalized.  Follow-up with your primary care doctor.

## 2018-02-15 ENCOUNTER — Ambulatory Visit (INDEPENDENT_AMBULATORY_CARE_PROVIDER_SITE_OTHER): Payer: Medicare Other | Admitting: *Deleted

## 2018-02-15 DIAGNOSIS — I4821 Permanent atrial fibrillation: Secondary | ICD-10-CM

## 2018-02-15 NOTE — Progress Notes (Signed)
Remote pacemaker transmission.   

## 2018-04-17 LAB — CUP PACEART REMOTE DEVICE CHECK
Implantable Lead Implant Date: 20150413
Implantable Pulse Generator Implant Date: 20150413
MDC IDC LEAD LOCATION: 753860
MDC IDC LEAD SERIAL: 29601447
MDC IDC SESS DTM: 20200111191124
Pulse Gen Serial Number: 68133668

## 2018-05-10 ENCOUNTER — Other Ambulatory Visit: Payer: Self-pay

## 2018-05-10 ENCOUNTER — Emergency Department (HOSPITAL_COMMUNITY)
Admission: EM | Admit: 2018-05-10 | Discharge: 2018-05-10 | Disposition: A | Discharge status: Home / Self Care | Payer: Medicare Other | Attending: Emergency Medicine | Admitting: Emergency Medicine

## 2018-05-10 ENCOUNTER — Emergency Department (HOSPITAL_COMMUNITY): Payer: Medicare Other

## 2018-05-10 ENCOUNTER — Encounter (HOSPITAL_COMMUNITY): Payer: Self-pay

## 2018-05-10 DIAGNOSIS — Z950 Presence of cardiac pacemaker: Secondary | ICD-10-CM | POA: Diagnosis not present

## 2018-05-10 DIAGNOSIS — I10 Essential (primary) hypertension: Secondary | ICD-10-CM | POA: Diagnosis not present

## 2018-05-10 DIAGNOSIS — Z7901 Long term (current) use of anticoagulants: Secondary | ICD-10-CM | POA: Diagnosis not present

## 2018-05-10 DIAGNOSIS — Z79899 Other long term (current) drug therapy: Secondary | ICD-10-CM | POA: Diagnosis not present

## 2018-05-10 DIAGNOSIS — Z96641 Presence of right artificial hip joint: Secondary | ICD-10-CM | POA: Diagnosis not present

## 2018-05-10 DIAGNOSIS — E1140 Type 2 diabetes mellitus with diabetic neuropathy, unspecified: Secondary | ICD-10-CM | POA: Insufficient documentation

## 2018-05-10 DIAGNOSIS — E871 Hypo-osmolality and hyponatremia: Secondary | ICD-10-CM | POA: Diagnosis not present

## 2018-05-10 DIAGNOSIS — I2510 Atherosclerotic heart disease of native coronary artery without angina pectoris: Secondary | ICD-10-CM | POA: Insufficient documentation

## 2018-05-10 DIAGNOSIS — R531 Weakness: Secondary | ICD-10-CM | POA: Diagnosis present

## 2018-05-10 LAB — URINALYSIS, ROUTINE W REFLEX MICROSCOPIC
BILIRUBIN URINE: NEGATIVE
GLUCOSE, UA: NEGATIVE mg/dL
Hgb urine dipstick: NEGATIVE
KETONES UR: NEGATIVE mg/dL
Leukocytes, UA: NEGATIVE
NITRITE: NEGATIVE
PH: 7 (ref 5.0–8.0)
Protein, ur: NEGATIVE mg/dL
Specific Gravity, Urine: 1.004 — ABNORMAL LOW (ref 1.005–1.030)

## 2018-05-10 LAB — CBC
HCT: 40 % (ref 36.0–46.0)
Hemoglobin: 12.8 g/dL (ref 12.0–15.0)
MCH: 29.3 pg (ref 26.0–34.0)
MCHC: 32 g/dL (ref 30.0–36.0)
MCV: 91.5 fL (ref 80.0–100.0)
NRBC: 0 % (ref 0.0–0.2)
PLATELETS: 280 10*3/uL (ref 150–400)
RBC: 4.37 MIL/uL (ref 3.87–5.11)
RDW: 12.8 % (ref 11.5–15.5)
WBC: 9.2 10*3/uL (ref 4.0–10.5)

## 2018-05-10 LAB — BASIC METABOLIC PANEL
ANION GAP: 8 (ref 5–15)
BUN: 12 mg/dL (ref 8–23)
CALCIUM: 9.6 mg/dL (ref 8.9–10.3)
CO2: 25 mmol/L (ref 22–32)
Chloride: 96 mmol/L — ABNORMAL LOW (ref 98–111)
Creatinine, Ser: 0.83 mg/dL (ref 0.44–1.00)
GFR calc Af Amer: >60 mL/min (ref >60)
Glucose, Bld: 120 mg/dL — ABNORMAL HIGH (ref 70–99)
Potassium: 3.7 mmol/L (ref 3.5–5.1)
Sodium: 129 mmol/L — ABNORMAL LOW (ref 135–145)

## 2018-05-10 LAB — TROPONIN I: Troponin I: <0.03 ng/mL (ref <0.03)

## 2018-05-10 MED ORDER — SODIUM CHLORIDE 0.9 % IV BOLUS
500.0000 mL | Freq: Once | INTRAVENOUS | Status: AC
Start: 2018-05-10 — End: 2018-05-10
  Administered 2018-05-10: 500 mL via INTRAVENOUS

## 2018-05-10 NOTE — ED Notes (Signed)
Patient transported to X-ray 

## 2018-05-10 NOTE — ED Triage Notes (Signed)
Pt c/o generalized weakness since the weekend.  Reports mild cough but nonproductive.  Pt says she gets sob with exertion and say is not normal for her.

## 2018-05-10 NOTE — Discharge Instructions (Signed)
Your testing has been normal except for a slight decrease in your salt level.  Please take increased amounts of salt in your food for the next couple of days and follow-up for a recheck of your salt level, sodium, in the next week.  Seek medical exam immediately in the emergency department for severe or worsening symptoms including pain, fever, vomiting, weakness, shortness of breath.
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
## 2018-05-12 LAB — URINE CULTURE: Culture: 50000 — AB

## 2018-05-13 ENCOUNTER — Telehealth: Payer: Self-pay

## 2018-05-13 NOTE — Telephone Encounter (Signed)
Post ED Visit - Positive Culture Follow-up  Culture report reviewed by antimicrobial stewardship pharmacist:  []  Elenor Quinones, Pharm.D. []  Heide Guile, Pharm.D., BCPS AQ-ID []  Parks Neptune, Pharm.D., BCPS []  Alycia Rossetti, Pharm.D., BCPS []  [place], Pharm.D., BCPS, AAHIVP []  Legrand Como, Pharm.D., BCPS, AAHIVP []  Salome Arnt, PharmD, BCPS []  Johnnette Gourd, PharmD, BCPS []  Hughes Better, PharmD, BCPS []  Leeroy Cha, PharmD Jerene Bears Pharm D Positive urine culture and no further patient follow-up is required at this time.  Genia Del 05/13/2018, 9:35 AM

## 2018-05-13 NOTE — Progress Notes (Signed)
ED Antimicrobial Stewardship Positive Culture Follow Up   Karina [name] is an 85 y.o. female who presented to [facility] on 05/10/2018 with a chief complaint of  Chief Complaint  Patient presents with  . Fatigue    Recent Results (from the past 720 hour(s))  Urine Culture     Status: Abnormal   Collection Time: 05/10/18 12:44 PM  Result Value Ref Range Status   Specimen Description   Final    URINE, CLEAN CATCH Performed at [facility], [address], [place], [place] [address]    Special Requests   Final    NONE Performed at [facility], [address] [place], [place] [address]    Culture (A)  Final    50,000 COLONIES/mL CORYNEBACTERIUM SPECIES Standardized susceptibility testing for this organism is not available. Performed at [facility], [place] [address] [place], [place] [address]    Report Status 05/12/2018 FINAL  Final   [x]  Patient discharged originally without antimicrobial agent  New antibiotic prescription: No further treatment recommended for asymptomatic bacteriuria  ED Provider: Janeece Fitting, PA-C   [name] 05/13/2018, 7:41 AM Clinical Pharmacist Monday - Friday phone -  [phone] Saturday - Sunday phone - [phone]

## 2018-05-17 ENCOUNTER — Ambulatory Visit (INDEPENDENT_AMBULATORY_CARE_PROVIDER_SITE_OTHER): Payer: Medicare Other

## 2018-05-17 DIAGNOSIS — I4821 Permanent atrial fibrillation: Secondary | ICD-10-CM

## 2018-05-17 LAB — CUP PACEART REMOTE DEVICE CHECK
Implantable Lead Implant Date: 20150413
Implantable Lead Location: 753860
Implantable Pulse Generator Implant Date: 20150413
MDC IDC LEAD SERIAL: 29601447
MDC IDC PG SERIAL: 68133668
MDC IDC SESS DTM: 20200210202518

## 2018-05-28 ENCOUNTER — Telehealth: Payer: Self-pay | Admitting: Internal Medicine

## 2018-05-28 NOTE — Telephone Encounter (Signed)
New message:     Patient would like for some one to call her back concerning her device. Please call patient back.

## 2018-05-28 NOTE — Telephone Encounter (Signed)
Returned call to patient.  She wanted to make sure device was doing ok.  Informed there that are nightly transmissions occurring and that we processed the last remote check was 05-17-18.  We will receive alerts if there are any abnormalities.  Plan to continue monitoring as scheduled.  Patient verbalized understanding.

## 2018-05-28 NOTE — Progress Notes (Signed)
Remote pacemaker transmission.   

## 2018-06-14 ENCOUNTER — Emergency Department (HOSPITAL_COMMUNITY)
Admission: EM | Admit: 2018-06-14 | Discharge: 2018-06-14 | Disposition: A | Discharge status: Home / Self Care | Payer: Medicare Other | Attending: Emergency Medicine | Admitting: Emergency Medicine

## 2018-06-14 ENCOUNTER — Other Ambulatory Visit: Payer: Self-pay

## 2018-06-14 ENCOUNTER — Encounter (HOSPITAL_COMMUNITY): Payer: Self-pay | Admitting: *Deleted

## 2018-06-14 DIAGNOSIS — Z8673 Personal history of transient ischemic attack (TIA), and cerebral infarction without residual deficits: Secondary | ICD-10-CM | POA: Insufficient documentation

## 2018-06-14 DIAGNOSIS — I2510 Atherosclerotic heart disease of native coronary artery without angina pectoris: Secondary | ICD-10-CM | POA: Insufficient documentation

## 2018-06-14 DIAGNOSIS — R040 Epistaxis: Secondary | ICD-10-CM | POA: Diagnosis not present

## 2018-06-14 DIAGNOSIS — Z79899 Other long term (current) drug therapy: Secondary | ICD-10-CM | POA: Insufficient documentation

## 2018-06-14 DIAGNOSIS — I10 Essential (primary) hypertension: Secondary | ICD-10-CM | POA: Insufficient documentation

## 2018-06-14 DIAGNOSIS — Z7901 Long term (current) use of anticoagulants: Secondary | ICD-10-CM | POA: Diagnosis not present

## 2018-06-14 DIAGNOSIS — E119 Type 2 diabetes mellitus without complications: Secondary | ICD-10-CM | POA: Insufficient documentation

## 2018-06-14 DIAGNOSIS — Z950 Presence of cardiac pacemaker: Secondary | ICD-10-CM | POA: Insufficient documentation

## 2018-06-14 LAB — BASIC METABOLIC PANEL
ANION GAP: 9 (ref 5–15)
BUN: 10 mg/dL (ref 8–23)
CALCIUM: 9.1 mg/dL (ref 8.9–10.3)
CO2: 25 mmol/L (ref 22–32)
Chloride: 95 mmol/L — ABNORMAL LOW (ref 98–111)
Creatinine, Ser: 0.8 mg/dL (ref 0.44–1.00)
GFR calc Af Amer: >60 mL/min (ref >60)
GFR calc non Af Amer: >60 mL/min (ref >60)
GLUCOSE: 116 mg/dL — AB (ref 70–99)
Potassium: 4 mmol/L (ref 3.5–5.1)
Sodium: 129 mmol/L — ABNORMAL LOW (ref 135–145)

## 2018-06-14 LAB — CBC WITH DIFFERENTIAL/PLATELET
Abs Immature Granulocytes: 0.05 10*3/uL (ref 0.00–0.07)
Basophils Absolute: 0 10*3/uL (ref 0.0–0.1)
Basophils Relative: 1 %
EOS ABS: 0.2 10*3/uL (ref 0.0–0.5)
EOS PCT: 3 %
HEMATOCRIT: 40.9 % (ref 36.0–46.0)
Hemoglobin: 13 g/dL (ref 12.0–15.0)
Immature Granulocytes: 1 %
LYMPHS ABS: 1.1 10*3/uL (ref 0.7–4.0)
Lymphocytes Relative: 14 %
MCH: 29 pg (ref 26.0–34.0)
MCHC: 31.8 g/dL (ref 30.0–36.0)
MCV: 91.1 fL (ref 80.0–100.0)
MONO ABS: 0.8 10*3/uL (ref 0.1–1.0)
Monocytes Relative: 10 %
Neutro Abs: 5.8 10*3/uL (ref 1.7–7.7)
Neutrophils Relative %: 71 %
Platelets: 260 10*3/uL (ref 150–400)
RBC: 4.49 MIL/uL (ref 3.87–5.11)
RDW: 12.9 % (ref 11.5–15.5)
WBC: 8.1 10*3/uL (ref 4.0–10.5)
nRBC: 0 % (ref 0.0–0.2)

## 2018-06-14 MED ORDER — OXYMETAZOLINE HCL 0.05 % NA SOLN
NASAL | Status: AC
  Filled 2018-06-14: qty 30
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
## 2018-06-14 NOTE — ED Triage Notes (Signed)
Nose bleed since 11 am.  Patient reports Eliquis for history of Afib.  Dose taken today.  Afrin given by EMS.

## 2018-06-14 NOTE — Discharge Instructions (Addendum)
Do not take your Eliquis tonight or tomorrow morning.  Follow-up with your family doctor in the next couple days to check your nosebleed.  If you start bleeding again then you should use the Afrin spray.  Spray twice in each nostril and hold your nose for 15 minutes.  If you are still bleeding then come back to the hospital.  You can call [phone] if you are by herself to get transportation

## 2018-08-05 ENCOUNTER — Other Ambulatory Visit: Payer: Self-pay

## 2018-08-05 ENCOUNTER — Encounter (HOSPITAL_COMMUNITY): Payer: Self-pay | Admitting: *Deleted

## 2018-08-05 ENCOUNTER — Emergency Department (HOSPITAL_COMMUNITY): Payer: Medicare Other

## 2018-08-05 ENCOUNTER — Emergency Department (HOSPITAL_COMMUNITY)
Admission: EM | Admit: 2018-08-05 | Discharge: 2018-08-05 | Disposition: A | Discharge status: Home / Self Care | Payer: Medicare Other | Attending: Emergency Medicine | Admitting: Emergency Medicine

## 2018-08-05 DIAGNOSIS — Z7901 Long term (current) use of anticoagulants: Secondary | ICD-10-CM | POA: Insufficient documentation

## 2018-08-05 DIAGNOSIS — Z79899 Other long term (current) drug therapy: Secondary | ICD-10-CM | POA: Diagnosis not present

## 2018-08-05 DIAGNOSIS — E119 Type 2 diabetes mellitus without complications: Secondary | ICD-10-CM | POA: Diagnosis not present

## 2018-08-05 DIAGNOSIS — I10 Essential (primary) hypertension: Secondary | ICD-10-CM | POA: Diagnosis not present

## 2018-08-05 DIAGNOSIS — M6281 Muscle weakness (generalized): Secondary | ICD-10-CM | POA: Diagnosis not present

## 2018-08-05 DIAGNOSIS — Z950 Presence of cardiac pacemaker: Secondary | ICD-10-CM | POA: Diagnosis not present

## 2018-08-05 DIAGNOSIS — R531 Weakness: Secondary | ICD-10-CM | POA: Diagnosis present

## 2018-08-05 DIAGNOSIS — I2510 Atherosclerotic heart disease of native coronary artery without angina pectoris: Secondary | ICD-10-CM | POA: Diagnosis not present

## 2018-08-05 DIAGNOSIS — Z8673 Personal history of transient ischemic attack (TIA), and cerebral infarction without residual deficits: Secondary | ICD-10-CM | POA: Diagnosis not present

## 2018-08-05 LAB — CBC WITH DIFFERENTIAL/PLATELET
Abs Immature Granulocytes: 0.03 10*3/uL (ref 0.00–0.07)
Basophils Absolute: 0.1 10*3/uL (ref 0.0–0.1)
Basophils Relative: 1 %
Eosinophils Absolute: 0.4 10*3/uL (ref 0.0–0.5)
Eosinophils Relative: 4 %
HCT: 38.4 % (ref 36.0–46.0)
Hemoglobin: 12.4 g/dL (ref 12.0–15.0)
Immature Granulocytes: 0 %
Lymphocytes Relative: 19 %
Lymphs Abs: 1.6 10*3/uL (ref 0.7–4.0)
MCH: 29.7 pg (ref 26.0–34.0)
MCHC: 32.3 g/dL (ref 30.0–36.0)
MCV: 91.9 fL (ref 80.0–100.0)
Monocytes Absolute: 1 10*3/uL (ref 0.1–1.0)
Monocytes Relative: 13 %
Neutro Abs: 5 10*3/uL (ref 1.7–7.7)
Neutrophils Relative %: 63 %
Platelets: 247 10*3/uL (ref 150–400)
RBC: 4.18 MIL/uL (ref 3.87–5.11)
RDW: 12.8 % (ref 11.5–15.5)
WBC: 8.1 10*3/uL (ref 4.0–10.5)
nRBC: 0 % (ref 0.0–0.2)

## 2018-08-05 LAB — COMPREHENSIVE METABOLIC PANEL
ALT: 15 U/L (ref 0–44)
AST: 18 U/L (ref 15–41)
Albumin: 3.7 g/dL (ref 3.5–5.0)
Alkaline Phosphatase: 92 U/L (ref 38–126)
Anion gap: 9 (ref 5–15)
BUN: 12 mg/dL (ref 8–23)
CO2: 25 mmol/L (ref 22–32)
Calcium: 9.2 mg/dL (ref 8.9–10.3)
Chloride: 96 mmol/L — ABNORMAL LOW (ref 98–111)
Creatinine, Ser: 0.73 mg/dL (ref 0.44–1.00)
GFR calc Af Amer: >60 mL/min (ref >60)
GFR calc non Af Amer: >60 mL/min (ref >60)
Glucose, Bld: 94 mg/dL (ref 70–99)
Potassium: 3.7 mmol/L (ref 3.5–5.1)
Sodium: 130 mmol/L — ABNORMAL LOW (ref 135–145)
Total Bilirubin: 0.9 mg/dL (ref 0.3–1.2)
Total Protein: 6.5 g/dL (ref 6.5–8.1)

## 2018-08-05 LAB — URINALYSIS, ROUTINE W REFLEX MICROSCOPIC
Bilirubin Urine: NEGATIVE
Glucose, UA: NEGATIVE mg/dL
Hgb urine dipstick: NEGATIVE
Ketones, ur: NEGATIVE mg/dL
Leukocytes,Ua: NEGATIVE
Nitrite: NEGATIVE
Protein, ur: NEGATIVE mg/dL
Specific Gravity, Urine: 1.005 (ref 1.005–1.030)
pH: 7 (ref 5.0–8.0)

## 2018-08-05 LAB — I-STAT TROPONIN, ED: Troponin i, poc: 0 ng/mL (ref 0.00–0.08)
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
## 2018-08-05 NOTE — ED Triage Notes (Addendum)
Pt brought in by EMS with c/o generalized weakness x 3 days. Denies fever, cough. Pt reports some SOB since feeling weak and "discomfort" to mid abdomen that started today.

## 2018-08-05 NOTE — Discharge Instructions (Signed)
Follow up with your cardiologist as discussed.

## 2018-08-05 NOTE — ED Notes (Signed)
Pt ambulated to restroom with NT, utilizing walker. Pt not c/o SOB at this time.

## 2018-08-05 NOTE — ED Provider Notes (Signed)
  Face-to-face evaluation   History: She is here for evaluation of weakness, and occasional shortness of breath.  Previously saw her cardiologist for the same problem.  Physical exam: Elderly patient alert and cooperative.  She is lucid.  Abdomen soft nontender.  No respiratory distress.  She moves arms and legs normally.  Medical screening examination/treatment/procedure(s) were conducted as a shared visit with non-physician practitioner(s) and myself.  I personally evaluated the patient during the encounter     Date: 08/05/18- 1656  Rate: 81  Rhythm: paced ventricular  QRS Axis: NA  PR and QT Intervals: prolonged Qtc  ST/T Wave abnormalities: nonspecific T wave changes  PR and QRS Conduction Disutrbances: NA     Daleen Bo, MD 08/06/18 1047
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
## 2018-08-16 ENCOUNTER — Other Ambulatory Visit: Payer: Self-pay

## 2018-08-16 ENCOUNTER — Ambulatory Visit (INDEPENDENT_AMBULATORY_CARE_PROVIDER_SITE_OTHER): Payer: Medicare Other | Admitting: *Deleted

## 2018-08-16 DIAGNOSIS — I4821 Permanent atrial fibrillation: Secondary | ICD-10-CM | POA: Diagnosis not present

## 2018-08-16 DIAGNOSIS — R001 Bradycardia, unspecified: Secondary | ICD-10-CM

## 2018-08-17 LAB — CUP PACEART REMOTE DEVICE CHECK
Date Time Interrogation Session: 20200512125520
Implantable Lead Implant Date: 20150413
Implantable Lead Location: 753860
Implantable Lead Serial Number: 29601447
Implantable Pulse Generator Implant Date: 20150413
Pulse Gen Serial Number: 68133668

## 2018-08-25 NOTE — Progress Notes (Signed)
Remote pacemaker transmission.   

## 2018-11-10 ENCOUNTER — Other Ambulatory Visit: Payer: Self-pay

## 2018-11-10 ENCOUNTER — Emergency Department (HOSPITAL_COMMUNITY)
Admission: EM | Admit: 2018-11-10 | Discharge: 2018-11-11 | Disposition: A | Discharge status: Home / Self Care | Payer: Medicare Other | Attending: Emergency Medicine | Admitting: Emergency Medicine

## 2018-11-10 ENCOUNTER — Encounter (HOSPITAL_COMMUNITY): Payer: Self-pay | Admitting: Emergency Medicine

## 2018-11-10 DIAGNOSIS — Z7901 Long term (current) use of anticoagulants: Secondary | ICD-10-CM | POA: Insufficient documentation

## 2018-11-10 DIAGNOSIS — E871 Hypo-osmolality and hyponatremia: Secondary | ICD-10-CM | POA: Diagnosis not present

## 2018-11-10 DIAGNOSIS — R112 Nausea with vomiting, unspecified: Secondary | ICD-10-CM | POA: Insufficient documentation

## 2018-11-10 DIAGNOSIS — Z96641 Presence of right artificial hip joint: Secondary | ICD-10-CM | POA: Diagnosis not present

## 2018-11-10 DIAGNOSIS — Z79899 Other long term (current) drug therapy: Secondary | ICD-10-CM | POA: Diagnosis not present

## 2018-11-10 DIAGNOSIS — E119 Type 2 diabetes mellitus without complications: Secondary | ICD-10-CM | POA: Diagnosis not present

## 2018-11-10 DIAGNOSIS — E876 Hypokalemia: Secondary | ICD-10-CM | POA: Diagnosis not present

## 2018-11-10 DIAGNOSIS — Z8673 Personal history of transient ischemic attack (TIA), and cerebral infarction without residual deficits: Secondary | ICD-10-CM | POA: Insufficient documentation

## 2018-11-10 DIAGNOSIS — I10 Essential (primary) hypertension: Secondary | ICD-10-CM | POA: Diagnosis not present

## 2018-11-10 DIAGNOSIS — R197 Diarrhea, unspecified: Secondary | ICD-10-CM | POA: Insufficient documentation

## 2018-11-10 MED ORDER — ONDANSETRON HCL 4 MG/2ML IJ SOLN
4.0000 mg | Freq: Once | INTRAMUSCULAR | Status: AC
  Administered 2018-11-11: 4 mg via INTRAVENOUS
  Filled 2018-11-10: qty 2

## 2018-11-10 MED ORDER — SODIUM CHLORIDE 0.9 % IV BOLUS
500.0000 mL | Freq: Once | INTRAVENOUS | Status: AC
  Administered 2018-11-10: 500 mL via INTRAVENOUS

## 2018-11-10 NOTE — ED Triage Notes (Signed)
Patient brought in by EMS. Patient started having nausea and vomiting and diarrhea that started x 1 hour ago. Patient states that she is having nausea at this time.
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
## 2018-11-11 ENCOUNTER — Emergency Department (HOSPITAL_COMMUNITY): Payer: Medicare Other

## 2018-11-11 LAB — URINALYSIS, ROUTINE W REFLEX MICROSCOPIC
Bilirubin Urine: NEGATIVE
Glucose, UA: NEGATIVE mg/dL
Hgb urine dipstick: NEGATIVE
Ketones, ur: NEGATIVE mg/dL
Leukocytes,Ua: NEGATIVE
Nitrite: NEGATIVE
Protein, ur: NEGATIVE mg/dL
Specific Gravity, Urine: 1.008 (ref 1.005–1.030)
pH: 7 (ref 5.0–8.0)

## 2018-11-11 LAB — COMPREHENSIVE METABOLIC PANEL
ALT: 14 U/L (ref 0–44)
AST: 17 U/L (ref 15–41)
Albumin: 3.3 g/dL — ABNORMAL LOW (ref 3.5–5.0)
Alkaline Phosphatase: 81 U/L (ref 38–126)
Anion gap: 12 (ref 5–15)
BUN: 16 mg/dL (ref 8–23)
CO2: 21 mmol/L — ABNORMAL LOW (ref 22–32)
Calcium: 8.5 mg/dL — ABNORMAL LOW (ref 8.9–10.3)
Chloride: 91 mmol/L — ABNORMAL LOW (ref 98–111)
Creatinine, Ser: 0.75 mg/dL (ref 0.44–1.00)
GFR calc Af Amer: >60 mL/min (ref >60)
GFR calc non Af Amer: >60 mL/min (ref >60)
Glucose, Bld: 126 mg/dL — ABNORMAL HIGH (ref 70–99)
Potassium: 2.8 mmol/L — ABNORMAL LOW (ref 3.5–5.1)
Sodium: 124 mmol/L — ABNORMAL LOW (ref 135–145)
Total Bilirubin: 0.8 mg/dL (ref 0.3–1.2)
Total Protein: 6.6 g/dL (ref 6.5–8.1)

## 2018-11-11 LAB — CBC WITH DIFFERENTIAL/PLATELET
Abs Immature Granulocytes: 0.11 10*3/uL — ABNORMAL HIGH (ref 0.00–0.07)
Basophils Absolute: 0.1 10*3/uL (ref 0.0–0.1)
Basophils Relative: 1 %
Eosinophils Absolute: 0.3 10*3/uL (ref 0.0–0.5)
Eosinophils Relative: 2 %
HCT: 38.3 % (ref 36.0–46.0)
Hemoglobin: 13.1 g/dL (ref 12.0–15.0)
Immature Granulocytes: 1 %
Lymphocytes Relative: 10 %
Lymphs Abs: 1.2 10*3/uL (ref 0.7–4.0)
MCH: 29.8 pg (ref 26.0–34.0)
MCHC: 34.2 g/dL (ref 30.0–36.0)
MCV: 87 fL (ref 80.0–100.0)
Monocytes Absolute: 1.3 10*3/uL — ABNORMAL HIGH (ref 0.1–1.0)
Monocytes Relative: 12 %
Neutro Abs: 8.4 10*3/uL — ABNORMAL HIGH (ref 1.7–7.7)
Neutrophils Relative %: 74 %
Platelets: 260 10*3/uL (ref 150–400)
RBC: 4.4 MIL/uL (ref 3.87–5.11)
RDW: 12.9 % (ref 11.5–15.5)
WBC: 11.3 10*3/uL — ABNORMAL HIGH (ref 4.0–10.5)
nRBC: 0 % (ref 0.0–0.2)

## 2018-11-11 MED ORDER — POTASSIUM CHLORIDE 10 MEQ/100ML IV SOLN
10.0000 meq | INTRAVENOUS | Status: AC
  Administered 2018-11-11 (×2): 10 meq via INTRAVENOUS
  Filled 2018-11-11 (×2): qty 100

## 2018-11-11 MED ORDER — CALCIUM CARBONATE ANTACID 500 MG PO CHEW
200.0000 mg | CHEWABLE_TABLET | Freq: Once | ORAL | Status: AC
  Administered 2018-11-11: 200 mg via ORAL
  Filled 2018-11-11: qty 1

## 2018-11-11 MED ORDER — POTASSIUM CHLORIDE CRYS ER 20 MEQ PO TBCR
40.0000 meq | EXTENDED_RELEASE_TABLET | Freq: Once | ORAL | Status: AC
  Administered 2018-11-11: 40 meq via ORAL
  Filled 2018-11-11: qty 2

## 2018-11-11 MED ORDER — ONDANSETRON HCL 4 MG PO TABS: 4.0000 mg | ORAL_TABLET | Freq: Four times a day (QID) | ORAL | 0 refills | Status: DC

## 2018-11-11 NOTE — Discharge Instructions (Signed)
Do not take any furosemide (Lasix) for the next 3 days.  Please schedule follow-up with your primary doctor for as soon as possible.

## 2018-11-15 ENCOUNTER — Ambulatory Visit (INDEPENDENT_AMBULATORY_CARE_PROVIDER_SITE_OTHER): Payer: Medicare Other | Admitting: *Deleted

## 2018-11-15 DIAGNOSIS — I4821 Permanent atrial fibrillation: Secondary | ICD-10-CM

## 2018-11-15 DIAGNOSIS — I495 Sick sinus syndrome: Secondary | ICD-10-CM | POA: Diagnosis not present

## 2018-11-19 LAB — CUP PACEART REMOTE DEVICE CHECK
Date Time Interrogation Session: 20200814091519
Implantable Lead Implant Date: 20150413
Implantable Lead Location: 753860
Implantable Lead Serial Number: 29601447
Implantable Pulse Generator Implant Date: 20150413
Pulse Gen Serial Number: 68133668

## 2018-11-23 NOTE — Progress Notes (Signed)
Remote pacemaker transmission.   

## 2018-12-20 ENCOUNTER — Observation Stay (HOSPITAL_COMMUNITY)
Admission: EM | Admit: 2018-12-20 | Discharge: 2018-12-21 | Disposition: A | Discharge status: Home / Self Care | Payer: Medicare Other | Attending: Family Medicine | Admitting: Family Medicine

## 2018-12-20 ENCOUNTER — Emergency Department (HOSPITAL_COMMUNITY): Payer: Medicare Other

## 2018-12-20 ENCOUNTER — Encounter (HOSPITAL_COMMUNITY): Payer: Self-pay | Admitting: Emergency Medicine

## 2018-12-20 ENCOUNTER — Other Ambulatory Visit: Payer: Self-pay

## 2018-12-20 DIAGNOSIS — Z23 Encounter for immunization: Secondary | ICD-10-CM | POA: Diagnosis not present

## 2018-12-20 DIAGNOSIS — Z950 Presence of cardiac pacemaker: Secondary | ICD-10-CM | POA: Insufficient documentation

## 2018-12-20 DIAGNOSIS — E119 Type 2 diabetes mellitus without complications: Secondary | ICD-10-CM | POA: Diagnosis not present

## 2018-12-20 DIAGNOSIS — Z7901 Long term (current) use of anticoagulants: Secondary | ICD-10-CM | POA: Insufficient documentation

## 2018-12-20 DIAGNOSIS — I10 Essential (primary) hypertension: Secondary | ICD-10-CM | POA: Diagnosis not present

## 2018-12-20 DIAGNOSIS — R0602 Shortness of breath: Principal | ICD-10-CM

## 2018-12-20 DIAGNOSIS — Z955 Presence of coronary angioplasty implant and graft: Secondary | ICD-10-CM | POA: Diagnosis not present

## 2018-12-20 DIAGNOSIS — Z8673 Personal history of transient ischemic attack (TIA), and cerebral infarction without residual deficits: Secondary | ICD-10-CM | POA: Diagnosis not present

## 2018-12-20 DIAGNOSIS — Z79899 Other long term (current) drug therapy: Secondary | ICD-10-CM | POA: Diagnosis not present

## 2018-12-20 DIAGNOSIS — I2510 Atherosclerotic heart disease of native coronary artery without angina pectoris: Secondary | ICD-10-CM | POA: Insufficient documentation

## 2018-12-20 DIAGNOSIS — R531 Weakness: Secondary | ICD-10-CM

## 2018-12-20 DIAGNOSIS — R0600 Dyspnea, unspecified: Secondary | ICD-10-CM | POA: Diagnosis present

## 2018-12-20 DIAGNOSIS — Z20828 Contact with and (suspected) exposure to other viral communicable diseases: Secondary | ICD-10-CM | POA: Diagnosis not present

## 2018-12-20 LAB — CBC WITH DIFFERENTIAL/PLATELET
Abs Immature Granulocytes: 0.05 10*3/uL (ref 0.00–0.07)
Basophils Absolute: 0.1 10*3/uL (ref 0.0–0.1)
Basophils Relative: 1 %
Eosinophils Absolute: 0.3 10*3/uL (ref 0.0–0.5)
Eosinophils Relative: 4 %
HCT: 38.4 % (ref 36.0–46.0)
Hemoglobin: 12.5 g/dL (ref 12.0–15.0)
Immature Granulocytes: 1 %
Lymphocytes Relative: 10 %
Lymphs Abs: 0.8 10*3/uL (ref 0.7–4.0)
MCH: 29.7 pg (ref 26.0–34.0)
MCHC: 32.6 g/dL (ref 30.0–36.0)
MCV: 91.2 fL (ref 80.0–100.0)
Monocytes Absolute: 1.1 10*3/uL — ABNORMAL HIGH (ref 0.1–1.0)
Monocytes Relative: 14 %
Neutro Abs: 5.7 10*3/uL (ref 1.7–7.7)
Neutrophils Relative %: 70 %
Platelets: 264 10*3/uL (ref 150–400)
RBC: 4.21 MIL/uL (ref 3.87–5.11)
RDW: 13.4 % (ref 11.5–15.5)
WBC: 8.1 10*3/uL (ref 4.0–10.5)
nRBC: 0 % (ref 0.0–0.2)

## 2018-12-20 LAB — URINALYSIS, ROUTINE W REFLEX MICROSCOPIC
Bilirubin Urine: NEGATIVE
Glucose, UA: NEGATIVE mg/dL
Hgb urine dipstick: NEGATIVE
Ketones, ur: NEGATIVE mg/dL
Leukocytes,Ua: NEGATIVE
Nitrite: NEGATIVE
Protein, ur: NEGATIVE mg/dL
Specific Gravity, Urine: 1.034 — ABNORMAL HIGH (ref 1.005–1.030)
pH: 7 (ref 5.0–8.0)

## 2018-12-20 LAB — TROPONIN I (HIGH SENSITIVITY)
Troponin I (High Sensitivity): 4 ng/L (ref <18)
Troponin I (High Sensitivity): 4 ng/L (ref <18)

## 2018-12-20 LAB — COMPREHENSIVE METABOLIC PANEL
ALT: 12 U/L (ref 0–44)
AST: 15 U/L (ref 15–41)
Albumin: 3.5 g/dL (ref 3.5–5.0)
Alkaline Phosphatase: 96 U/L (ref 38–126)
Anion gap: 8 (ref 5–15)
BUN: 10 mg/dL (ref 8–23)
CO2: 23 mmol/L (ref 22–32)
Calcium: 9 mg/dL (ref 8.9–10.3)
Chloride: 99 mmol/L (ref 98–111)
Creatinine, Ser: 0.73 mg/dL (ref 0.44–1.00)
GFR calc Af Amer: >60 mL/min (ref >60)
GFR calc non Af Amer: >60 mL/min (ref >60)
Glucose, Bld: 112 mg/dL — ABNORMAL HIGH (ref 70–99)
Potassium: 4.5 mmol/L (ref 3.5–5.1)
Sodium: 130 mmol/L — ABNORMAL LOW (ref 135–145)
Total Bilirubin: 1 mg/dL (ref 0.3–1.2)
Total Protein: 6.7 g/dL (ref 6.5–8.1)

## 2018-12-20 LAB — BRAIN NATRIURETIC PEPTIDE: B Natriuretic Peptide: 740 pg/mL — ABNORMAL HIGH (ref 0.0–100.0)

## 2018-12-20 LAB — D-DIMER, QUANTITATIVE: D-Dimer, Quant: 1.37 ug/mL-FEU — ABNORMAL HIGH (ref 0.00–0.50)

## 2018-12-20 LAB — SARS CORONAVIRUS 2 BY RT PCR (HOSPITAL ORDER, PERFORMED IN ~~LOC~~ HOSPITAL LAB): SARS Coronavirus 2: NEGATIVE

## 2018-12-20 MED ORDER — CLONIDINE HCL 0.2 MG PO TABS
0.2000 mg | ORAL_TABLET | Freq: Every day | ORAL | Status: DC
  Administered 2018-12-21: 0.2 mg via ORAL
  Filled 2018-12-20: qty 1

## 2018-12-20 MED ORDER — IOHEXOL 350 MG/ML SOLN
100.0000 mL | Freq: Once | INTRAVENOUS | Status: AC | PRN
  Administered 2018-12-20: 100 mL via INTRAVENOUS

## 2018-12-20 MED ORDER — INFLUENZA VAC A&B SA ADJ QUAD 0.5 ML IM PRSY
0.5000 mL | PREFILLED_SYRINGE | INTRAMUSCULAR | Status: AC
  Administered 2018-12-21: 0.5 mL via INTRAMUSCULAR
  Filled 2018-12-20 (×2): qty 0.5

## 2018-12-20 MED ORDER — METOPROLOL SUCCINATE ER 50 MG PO TB24
50.0000 mg | ORAL_TABLET | Freq: Two times a day (BID) | ORAL | Status: DC
  Administered 2018-12-20 – 2018-12-21 (×2): 50 mg via ORAL
  Filled 2018-12-20 (×2): qty 1

## 2018-12-20 MED ORDER — POTASSIUM CHLORIDE CRYS ER 20 MEQ PO TBCR
20.0000 meq | EXTENDED_RELEASE_TABLET | Freq: Every day | ORAL | Status: DC
  Administered 2018-12-21: 20 meq via ORAL
  Filled 2018-12-20: qty 1

## 2018-12-20 MED ORDER — DILTIAZEM HCL ER COATED BEADS 120 MG PO CP24
120.0000 mg | ORAL_CAPSULE | Freq: Every day | ORAL | Status: DC
  Administered 2018-12-21: 09:00:00 120 mg via ORAL
  Filled 2018-12-20: qty 1

## 2018-12-20 MED ORDER — CLONIDINE HCL 0.1 MG PO TABS: 0.1000 mg | ORAL_TABLET | Freq: Two times a day (BID) | ORAL | Status: DC

## 2018-12-20 MED ORDER — PANTOPRAZOLE SODIUM 40 MG PO TBEC
40.0000 mg | DELAYED_RELEASE_TABLET | Freq: Every day | ORAL | Status: DC
  Administered 2018-12-21: 09:00:00 40 mg via ORAL
  Filled 2018-12-20: qty 1

## 2018-12-20 MED ORDER — ROSUVASTATIN CALCIUM 10 MG PO TABS
5.0000 mg | ORAL_TABLET | Freq: Every day | ORAL | Status: DC
  Administered 2018-12-21: 19:00:00 5 mg via ORAL
  Filled 2018-12-20 (×2): qty 1

## 2018-12-20 MED ORDER — IRBESARTAN 150 MG PO TABS
150.0000 mg | ORAL_TABLET | Freq: Every day | ORAL | Status: DC
  Administered 2018-12-21: 150 mg via ORAL
  Filled 2018-12-20: qty 1

## 2018-12-20 MED ORDER — CLONIDINE HCL 0.1 MG PO TABS
0.1000 mg | ORAL_TABLET | Freq: Every day | ORAL | Status: DC
  Administered 2018-12-20: 22:00:00 0.1 mg via ORAL
  Filled 2018-12-20: qty 1

## 2018-12-20 MED ORDER — APIXABAN 2.5 MG PO TABS
2.5000 mg | ORAL_TABLET | Freq: Two times a day (BID) | ORAL | Status: DC
  Administered 2018-12-20 – 2018-12-21 (×2): 2.5 mg via ORAL
  Filled 2018-12-20 (×2): qty 1

## 2018-12-20 MED ORDER — CITALOPRAM HYDROBROMIDE 20 MG PO TABS
20.0000 mg | ORAL_TABLET | Freq: Every day | ORAL | Status: DC
  Administered 2018-12-21: 09:00:00 20 mg via ORAL
  Filled 2018-12-20: qty 1
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
## 2018-12-20 NOTE — ED Triage Notes (Signed)
Pt states she has been very weak and has been unable to walk due to generalized weakness for over a week. States she gives out and gets short of breath whenever she tries to do anything.

## 2018-12-20 NOTE — ED Triage Notes (Signed)
Pt now tells me her best friend has COVID and has been coming to her house daily

## 2018-12-20 NOTE — ED Notes (Signed)
Tried to ambulate patient with assistance. Patient able to stand with assistance and stated she was short of breath. Patient took a few steps with assistance oxygen saturation around 91 percent. Patient states she walks at home with walker and states that her ambulating has became progressively worse.
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
## 2018-12-20 NOTE — ED Provider Notes (Signed)
CTA NGO without any acute findings no evidence of pulmonary embolus.  Patient room air sats at rest were around 9394%.  Patient without any respiratory distress.  Patient was willing to go home.  We ambulated her with pulse ox.  Very unsteady on her feet but the nurse that took care of her knows her from before and that was somewhat baseline but with just a few steps she did get very winded and her oxygen sats did go down to 90%.  Patient did not have enough energy to go any further.  Discussed with hospitalist.  Patient's BNP up somewhat troponins x2 without any significant abnormalities.  Labs and electrolytes without significant abnormality including CBC.  Sodium was a little low at 130.  Kidney functions normal.  Hospitalist will admit for observation overnight.   Fredia Sorrow, MD 12/20/18 2032

## 2018-12-20 NOTE — ED Notes (Signed)
Pts caretaker [facility]  [phone]

## 2018-12-21 ENCOUNTER — Observation Stay (HOSPITAL_BASED_OUTPATIENT_CLINIC_OR_DEPARTMENT_OTHER): Payer: Medicare Other

## 2018-12-21 ENCOUNTER — Encounter: Payer: Medicare Other | Admitting: Internal Medicine

## 2018-12-21 DIAGNOSIS — I361 Nonrheumatic tricuspid (valve) insufficiency: Secondary | ICD-10-CM | POA: Diagnosis not present

## 2018-12-21 DIAGNOSIS — I351 Nonrheumatic aortic (valve) insufficiency: Secondary | ICD-10-CM | POA: Diagnosis not present

## 2018-12-21 DIAGNOSIS — R0600 Dyspnea, unspecified: Secondary | ICD-10-CM | POA: Diagnosis not present

## 2018-12-21 LAB — ECHOCARDIOGRAM COMPLETE
Height: 61 in
Weight: 2052.92 oz

## 2018-12-21 MED ORDER — ROSUVASTATIN CALCIUM 5 MG PO TABS: 5.0000 mg | ORAL_TABLET | Freq: Every day | ORAL | 3 refills | Status: DC

## 2018-12-21 MED ORDER — FUROSEMIDE 10 MG/ML IJ SOLN
20.0000 mg | Freq: Once | INTRAMUSCULAR | Status: AC
  Administered 2018-12-21: 20 mg via INTRAVENOUS
  Filled 2018-12-21: qty 2
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
## 2018-12-21 NOTE — Progress Notes (Signed)
IV removed, tele removed. D/C instructions reviewed with patient, verbalized understating. Patient to be transported to private vehicle via wheelchair.

## 2018-12-21 NOTE — Care Management Obs Status (Signed)
[facility] NOTIFICATION   Patient Details  Name: Karina [name] MRN: [id number] Date of Birth: [birth year]   Medicare Observation Status Notification Given:  Yes    Boneta Lucks, RN 12/21/2018, 3:49 PM

## 2018-12-21 NOTE — Progress Notes (Signed)
  Echocardiogram 2D Echocardiogram has been performed.  [name] 12/21/2018, 3:46 PM

## 2018-12-23 ENCOUNTER — Telehealth: Payer: Self-pay | Admitting: Internal Medicine

## 2018-12-23 NOTE — Telephone Encounter (Signed)
°  Pt c/o Shortness Of Breath: STAT if SOB developed within the last 24 hours or pt is noticeably SOB on the phone  1. Are you currently SOB (can you hear that pt is SOB on the phone)? no  2. How long have you been experiencing SOB? Three weeks  3. Are you SOB when sitting or when up moving around? When up and moving around  4. Are you currently experiencing any other symptoms? No   Pt was in [facility] and missed her pacemaker check with Dr. [name]. While in the hospital, she had a CT scan and an Echo. She wants to know what Dr. [name] would like to do in regards to her recent tests and her missed pacemaker check.

## 2018-12-24 NOTE — Telephone Encounter (Signed)
I left message for patient to call back.We have apt next week on 9/22 at 8:30 available with Dr.Taylor for pacer check

## 2018-12-27 NOTE — Telephone Encounter (Signed)
I spoke with patient today, she confirmed apt tomorrow

## 2018-12-28 ENCOUNTER — Other Ambulatory Visit: Payer: Self-pay

## 2018-12-28 ENCOUNTER — Encounter: Payer: Self-pay | Admitting: Internal Medicine

## 2018-12-28 ENCOUNTER — Ambulatory Visit (INDEPENDENT_AMBULATORY_CARE_PROVIDER_SITE_OTHER): Payer: Medicare Other | Admitting: Internal Medicine

## 2018-12-28 DIAGNOSIS — I480 Paroxysmal atrial fibrillation: Secondary | ICD-10-CM

## 2018-12-28 LAB — CUP PACEART INCLINIC DEVICE CHECK
Date Time Interrogation Session: 20200922083956
Implantable Lead Implant Date: 20150413
Implantable Lead Location: 753860
Implantable Lead Serial Number: 29601447
Implantable Pulse Generator Implant Date: 20150413
Pulse Gen Serial Number: 68133668

## 2018-12-28 NOTE — Patient Instructions (Signed)
Medication Instructions:  Your physician recommends that you continue on your current medications as directed. Please refer to the Current Medication list given to you today.  If you need a refill on your cardiac medications before your next appointment, please call your pharmacy.   Lab work: NONE   If you have labs (blood work) drawn today and your tests are completely normal, you will receive your results only by: . MyChart Message (if you have MyChart) OR . A paper copy in the mail If you have any lab test that is abnormal or we need to change your treatment, we will call you to review the results.  Testing/Procedures: NONE   Follow-Up: At [facility], you and your health needs are our priority.  As part of our continuing mission to provide you with exceptional heart care, we have created designated Provider Care Teams.  These Care Teams include your primary Cardiologist (physician) and Advanced Practice Providers (APPs -  Physician Assistants and Nurse Practitioners) who all work together to provide you with the care you need, when you need it. You will need a follow up appointment in 1 years.  Please call our office 2 months in advance to schedule this appointment.  You may see Gregg Taylor, MD or one of the following Advanced Practice Providers on your designated Care Team:   Amber Seiler, NP . Renee Ursuy, PA-C  Any Other Special Instructions Will Be Listed Below (If Applicable). Thank you for choosing [facility]!     

## 2018-12-28 NOTE — Progress Notes (Signed)
HPI Karina [name] returns today for followup. She is a pleasant 85 yo woman with a h/o chronic atrial fib, CAD s/p stenting of the LAD remotely. She is now on a strategy of rate control. She was in the hospital with generalized weakness of no obvious etiology. The patient is still trying to walk using a walker. No edema. No syncope. No anginal symptoms.   Allergies  Allergen Reactions  . Ace Inhibitors Hives  . Amiodarone Other (See Comments)    Worsening peripheral neuropathy  . Tikosyn [Dofetilide] Other (See Comments)    QT prolongation  . Meperidine And Related Other (See Comments)    Unknown     Current Outpatient Medications  Medication Sig Dispense Refill  . apixaban (ELIQUIS) 2.5 MG TABS tablet Take 1 tablet (2.5 mg total) 2 (two) times daily by mouth. 60 tablet 1  . citalopram (CELEXA) 20 MG tablet Take 20 mg by mouth daily.    . cloNIDine (CATAPRES) 0.1 MG tablet Take 0.1-0.2 mg by mouth 2 (two) times daily. Take 0.2mg  in morning and 0.1mg  at night    . diltiazem (CARDIZEM CD) 120 MG 24 hr capsule Take 120 mg by mouth daily.     . furosemide (LASIX) 20 MG tablet Take 20 mg by mouth daily as needed.  0  . metoprolol succinate (TOPROL-XL) 50 MG 24 hr tablet Take 50 mg by mouth 2 (two) times daily. Take with or immediately following a meal.     . mirtazapine (REMERON SOL-TAB) 15 MG disintegrating tablet Take 15 mg by mouth at bedtime.    . nitroGLYCERIN (NITROSTAT) 0.4 MG SL tablet Place 0.4 mg under the tongue every 5 (five) minutes as needed for chest pain.     . pantoprazole (PROTONIX) 40 MG tablet Take 40 mg by mouth daily.    . Potassium Chloride ER 20 MEQ TBCR Take 1 tablet by mouth daily.    . rosuvastatin (CRESTOR) 5 MG tablet Take 1 tablet (5 mg total) by mouth daily. 30 tablet 3  . valsartan (DIOVAN) 160 MG tablet Take 160 mg by mouth 2 (two) times daily.     No current facility-administered medications for this visit.      Past Medical History:   Diagnosis Date  . Actinic keratosis   . Bilateral leg weakness 09/27/12  . CAD (coronary artery disease)    EF 55% by 2 D Echo 2008 and 2012  . Chronic atrial fibrillation   . Chronic edema 10/15/11  . Chronic fatigue   . DDD (degenerative disc disease)   . Encounter for long-term (current) use of other medications 09/02/12  . Endometrial carcinoma ([place])   . Fibromuscular dysplasia (HCC)    S/P PCI right renal aretery 1999  . GERD (gastroesophageal reflux disease)   . Hip fracture, right ([place]) 08/06/06   S/P surgery  . Hx of cardiovascular stress test 09/2010   Nuclear stress testing showing no evidence of ischemia, EF=76%, No EKG changes & no perfusion defects.  Marland Kitchen Hx of echocardiogram 05/2012    EF >55%, LA 3.9 cm, mild LVH  . Hyperlipidemia   . Hypertension    Difficult to control  . Hyponatremia    Hypomatremia/SIADH, possibly secondary to Tegretol  . LVH (left ventricular hypertrophy)    Mild  . Osteoarthritis   . Osteoporosis    With Hx of thoracic compression fractures  . Pacemaker   . Paroxysmal atrial fibrillation (HCC)   . Peripheral neuropathy   .  Peripheral vascular disease ([place])   . Pre-diabetes 01/21/11   Chronic  . Renovascular hypertension   . Tachy-brady syndrome ([place])   . Thoracic compression fracture ([place])   . TIA (transient ischemic attack) 08/2011   OSH: flushing, left LE numbness, CT negative  . Tic douloureux 1994   S/P successful surgery    ROS:   All systems reviewed and negative except as noted in the HPI.   Past Surgical History:  Procedure Laterality Date  . ABDOMINAL AORTAGRAM  09/05/01   Right & left renals 25%  . APPENDECTOMY  1946  . CARDIAC CATHETERIZATION  11/05/01   EF 72%. RCA 25%. LCX 50%. Ramus 75/100%. LAD 95%. D2 75%.  . [place]  . [place]  . CHOLECYSTECTOMY  1989  . CORONARY ANGIOPLASTY    . CORONARY ANGIOPLASTY WITH STENT PLACEMENT  11/05/01   PTCA/Stent to 95% mid LAD, Ramus lesion could not be  crossed and was not treated.   Marland Kitchen DIPYRIDAMOLE STRESS TEST  09/2010   Nuclear stress testing showing no evidence of ischemia, EF=76%, No EKG changes & no perfusion defects.  . LUMBAR [place]    . ORIF FEMUR FRACTURE Right 02/16/2017   Procedure: OPEN REDUCTION INTERNAL FIXATION (ORIF) DISTAL FEMUR FRACTURE;  Surgeon: Shona Needles, MD;  Location: [place];  Service: Orthopedics;  Laterality: Right;  . RENAL ANGIOGRAM  09/14/97   25% diffuse left renal artery. 50% ostia; right renal artery.  Marland Kitchen RENAL ARTERY ANGIOPLASTY Right 1994   Renal artery stenosis secondary to fibromusclar dysplasia  . RENAL ARTERY ANGIOPLASTY Right 09/13/97   PTA/Stent to ostial right renal artery, No distal fibromuscular hyperplasia  . SPINAL FUSION  2002   Percutaneous spinal fusion  . TOTAL ABDOMINAL HYSTERECTOMY W/ BILATERAL SALPINGOOPHORECTOMY  1990  . VAGINAL HYSTERECTOMY  1990     Family History  Problem Relation Age of Onset  . COPD Brother   . Diabetes Brother        DM  . Hypertension Brother   . Stroke Father   . Stroke Other      Social History   Socioeconomic History  . Marital status: Widowed    Spouse name: Not on file  . Number of children: Not on file  . Years of education: Not on file  . Highest education level: Not on file  Occupational History  . Not on file  Social Needs  . Financial resource strain: Not on file  . Food insecurity    Worry: Not on file    Inability: Not on file  . Transportation needs    Medical: Not on file    Non-medical: Not on file  Tobacco Use  . Smoking status: Never Smoker  . Smokeless tobacco: Never Used  Substance and Sexual Activity  . Alcohol use: Yes    Alcohol/week: 0.0 standard drinks    Comment: Rarely  . Drug use: No  . Sexual activity: Not Currently  Lifestyle  . Physical activity    Days per week: Not on file    Minutes per session: Not on file  . Stress: Not on file  Relationships  . Social Herbalist on phone: Not  on file    Gets together: Not on file    Attends religious service: Not on file    Active member of club or organization: Not on file    Attends meetings of clubs or organizations: Not on file  Relationship status: Not on file  . Intimate partner violence    Fear of current or ex partner: Not on file    Emotionally abused: Not on file    Physically abused: Not on file    Forced sexual activity: Not on file  Other Topics Concern  . Not on file  Social History Narrative  . Not on file     BP (!) 139/92 (BP Location: Right Arm)   Temp (!) 97.4 F (36.3 C)   Ht 5' (1.524 m)   Wt 131 lb (59.4 kg)   SpO2 94%   BMI 25.58 kg/m   Physical Exam:  Well appearing elderly woman, NAD HEENT: Unremarkable Neck:  No JVD, no thyromegally Lymphatics:  No adenopathy Back:  No CVA tenderness Lungs:  Clear with no wheezes HEART:  Regular rate rhythm, no murmurs, no rubs, no clicks Abd:  soft, positive bowel sounds, no organomegally, no rebound, no guarding Ext:  2 plus pulses, no edema, no cyanosis, no clubbing Skin:  No rashes no nodules Neuro:  CN II through XII intact, motor grossly intact   DEVICE  Normal device function.  See PaceArt for details.   Assess/Plan: 1. CHB - she is s/p PPM insertion and is asymptomatic. Today her underlying rate was 40/min. 2. Atrial fib - her rate is well controlled. She is asymptomatic. 3. Diastolic heart failure - her symptoms are class 2. She will continue her current meds. 4. PPM - her Biotronik VVI PPM is working normally with about 8 years left on the battery.   Mikle Bosworth.D.

## 2019-02-14 ENCOUNTER — Ambulatory Visit (INDEPENDENT_AMBULATORY_CARE_PROVIDER_SITE_OTHER): Payer: Medicare Other | Admitting: *Deleted

## 2019-02-14 DIAGNOSIS — I4821 Permanent atrial fibrillation: Secondary | ICD-10-CM

## 2019-02-14 DIAGNOSIS — I495 Sick sinus syndrome: Secondary | ICD-10-CM

## 2019-02-16 LAB — CUP PACEART REMOTE DEVICE CHECK
Date Time Interrogation Session: 20201111200645
Implantable Lead Implant Date: 20150413
Implantable Lead Location: 753860
Implantable Lead Serial Number: 29601447
Implantable Pulse Generator Implant Date: 20150413
Pulse Gen Serial Number: 68133668

## 2019-03-14 NOTE — Progress Notes (Signed)
Remote pacemaker transmission.   

## 2019-03-22 ENCOUNTER — Other Ambulatory Visit: Payer: Self-pay

## 2019-03-22 ENCOUNTER — Ambulatory Visit: Payer: Medicare Other | Attending: Internal Medicine

## 2019-03-22 DIAGNOSIS — Z20822 Contact with and (suspected) exposure to covid-19: Secondary | ICD-10-CM

## 2019-03-23 LAB — NOVEL CORONAVIRUS, NAA: SARS-CoV-2, NAA: NOT DETECTED

## 2019-04-06 ENCOUNTER — Other Ambulatory Visit: Payer: Self-pay

## 2019-04-06 ENCOUNTER — Ambulatory Visit: Payer: Medicare Other | Attending: Internal Medicine

## 2019-04-06 DIAGNOSIS — Z20822 Contact with and (suspected) exposure to covid-19: Secondary | ICD-10-CM

## 2019-04-07 LAB — NOVEL CORONAVIRUS, NAA: SARS-CoV-2, NAA: NOT DETECTED

## 2019-04-14 DIAGNOSIS — Z299 Encounter for prophylactic measures, unspecified: Secondary | ICD-10-CM | POA: Diagnosis not present

## 2019-04-14 DIAGNOSIS — I509 Heart failure, unspecified: Secondary | ICD-10-CM | POA: Diagnosis not present

## 2019-04-14 DIAGNOSIS — Z789 Other specified health status: Secondary | ICD-10-CM | POA: Diagnosis not present

## 2019-04-14 DIAGNOSIS — I4891 Unspecified atrial fibrillation: Secondary | ICD-10-CM | POA: Diagnosis not present

## 2019-04-14 DIAGNOSIS — I10 Essential (primary) hypertension: Secondary | ICD-10-CM | POA: Diagnosis not present

## 2019-04-14 DIAGNOSIS — D692 Other nonthrombocytopenic purpura: Secondary | ICD-10-CM | POA: Diagnosis not present

## 2019-04-14 DIAGNOSIS — Z6824 Body mass index (BMI) 24.0-24.9, adult: Secondary | ICD-10-CM | POA: Diagnosis not present

## 2019-04-21 DIAGNOSIS — I4891 Unspecified atrial fibrillation: Secondary | ICD-10-CM | POA: Diagnosis not present

## 2019-04-21 DIAGNOSIS — I509 Heart failure, unspecified: Secondary | ICD-10-CM | POA: Diagnosis not present

## 2019-04-21 DIAGNOSIS — I2510 Atherosclerotic heart disease of native coronary artery without angina pectoris: Secondary | ICD-10-CM | POA: Diagnosis not present

## 2019-04-21 DIAGNOSIS — M159 Polyosteoarthritis, unspecified: Secondary | ICD-10-CM | POA: Diagnosis not present

## 2019-04-29 DIAGNOSIS — I10 Essential (primary) hypertension: Secondary | ICD-10-CM | POA: Diagnosis not present

## 2019-04-29 DIAGNOSIS — I4891 Unspecified atrial fibrillation: Secondary | ICD-10-CM | POA: Diagnosis not present

## 2019-04-29 DIAGNOSIS — Z6823 Body mass index (BMI) 23.0-23.9, adult: Secondary | ICD-10-CM | POA: Diagnosis not present

## 2019-04-29 DIAGNOSIS — I509 Heart failure, unspecified: Secondary | ICD-10-CM | POA: Diagnosis not present

## 2019-04-29 DIAGNOSIS — Z299 Encounter for prophylactic measures, unspecified: Secondary | ICD-10-CM | POA: Diagnosis not present

## 2019-05-03 DIAGNOSIS — I10 Essential (primary) hypertension: Secondary | ICD-10-CM | POA: Diagnosis not present

## 2019-05-05 DIAGNOSIS — M1990 Unspecified osteoarthritis, unspecified site: Secondary | ICD-10-CM | POA: Diagnosis not present

## 2019-05-05 DIAGNOSIS — R2681 Unsteadiness on feet: Secondary | ICD-10-CM | POA: Diagnosis not present

## 2019-05-05 DIAGNOSIS — Z299 Encounter for prophylactic measures, unspecified: Secondary | ICD-10-CM | POA: Diagnosis not present

## 2019-05-05 DIAGNOSIS — I509 Heart failure, unspecified: Secondary | ICD-10-CM | POA: Diagnosis not present

## 2019-05-05 DIAGNOSIS — W06XXXA Fall from bed, initial encounter: Secondary | ICD-10-CM | POA: Diagnosis not present

## 2019-05-05 DIAGNOSIS — Z6824 Body mass index (BMI) 24.0-24.9, adult: Secondary | ICD-10-CM | POA: Diagnosis not present

## 2019-05-06 DIAGNOSIS — I10 Essential (primary) hypertension: Secondary | ICD-10-CM | POA: Diagnosis not present

## 2019-05-06 DIAGNOSIS — M5136 Other intervertebral disc degeneration, lumbar region: Secondary | ICD-10-CM | POA: Diagnosis not present

## 2019-05-06 DIAGNOSIS — E782 Mixed hyperlipidemia: Secondary | ICD-10-CM | POA: Diagnosis not present

## 2019-05-06 DIAGNOSIS — M545 Low back pain: Secondary | ICD-10-CM | POA: Diagnosis not present

## 2019-05-06 DIAGNOSIS — I5032 Chronic diastolic (congestive) heart failure: Secondary | ICD-10-CM | POA: Diagnosis not present

## 2019-05-06 DIAGNOSIS — Z981 Arthrodesis status: Secondary | ICD-10-CM | POA: Diagnosis not present

## 2019-05-06 DIAGNOSIS — I50812 Chronic right heart failure: Secondary | ICD-10-CM | POA: Diagnosis not present

## 2019-05-16 ENCOUNTER — Ambulatory Visit (INDEPENDENT_AMBULATORY_CARE_PROVIDER_SITE_OTHER): Payer: Medicare Other | Admitting: *Deleted

## 2019-05-16 DIAGNOSIS — I495 Sick sinus syndrome: Secondary | ICD-10-CM

## 2019-05-16 LAB — CUP PACEART REMOTE DEVICE CHECK
Date Time Interrogation Session: 20210208094816
Implantable Lead Implant Date: 20150413
Implantable Lead Location: 753860
Implantable Lead Serial Number: 29601447
Implantable Pulse Generator Implant Date: 20150413
Pulse Gen Serial Number: 68133668

## 2019-05-17 NOTE — Progress Notes (Signed)
PPM Remote  

## 2019-05-19 DIAGNOSIS — E871 Hypo-osmolality and hyponatremia: Secondary | ICD-10-CM | POA: Diagnosis not present

## 2019-05-19 DIAGNOSIS — I110 Hypertensive heart disease with heart failure: Secondary | ICD-10-CM | POA: Diagnosis not present

## 2019-05-19 DIAGNOSIS — I714 Abdominal aortic aneurysm, without rupture: Secondary | ICD-10-CM | POA: Diagnosis not present

## 2019-05-19 DIAGNOSIS — E782 Mixed hyperlipidemia: Secondary | ICD-10-CM | POA: Diagnosis not present

## 2019-05-19 DIAGNOSIS — I5032 Chronic diastolic (congestive) heart failure: Secondary | ICD-10-CM | POA: Diagnosis not present

## 2019-06-02 DIAGNOSIS — I10 Essential (primary) hypertension: Secondary | ICD-10-CM | POA: Diagnosis not present

## 2019-06-03 DIAGNOSIS — I739 Peripheral vascular disease, unspecified: Secondary | ICD-10-CM | POA: Diagnosis not present

## 2019-06-03 DIAGNOSIS — D692 Other nonthrombocytopenic purpura: Secondary | ICD-10-CM | POA: Diagnosis not present

## 2019-06-03 DIAGNOSIS — Z299 Encounter for prophylactic measures, unspecified: Secondary | ICD-10-CM | POA: Diagnosis not present

## 2019-06-03 DIAGNOSIS — I714 Abdominal aortic aneurysm, without rupture: Secondary | ICD-10-CM | POA: Diagnosis not present

## 2019-06-03 DIAGNOSIS — I10 Essential (primary) hypertension: Secondary | ICD-10-CM | POA: Diagnosis not present

## 2019-06-13 ENCOUNTER — Telehealth: Payer: Self-pay | Admitting: Internal Medicine

## 2019-06-13 NOTE — Telephone Encounter (Signed)
Patient recently found out that she has an enlarged aorta per another doctor at [facility]. She wants to know why this was not discovered on her last pacer check. Please advise.

## 2019-06-13 NOTE — Telephone Encounter (Signed)
Advised pt that we would not be able to see an enlarged Aorta on her PM check.  She reports her doctor at [facility] is sending her for a CT scan this week.

## 2019-06-15 DIAGNOSIS — I714 Abdominal aortic aneurysm, without rupture: Secondary | ICD-10-CM | POA: Diagnosis not present

## 2019-06-15 DIAGNOSIS — Z01818 Encounter for other preprocedural examination: Secondary | ICD-10-CM | POA: Diagnosis not present

## 2019-06-24 DIAGNOSIS — I714 Abdominal aortic aneurysm, without rupture: Secondary | ICD-10-CM | POA: Diagnosis not present

## 2019-06-24 DIAGNOSIS — Z01818 Encounter for other preprocedural examination: Secondary | ICD-10-CM | POA: Diagnosis not present

## 2019-07-05 DIAGNOSIS — I10 Essential (primary) hypertension: Secondary | ICD-10-CM | POA: Diagnosis not present

## 2019-07-06 DIAGNOSIS — I4891 Unspecified atrial fibrillation: Secondary | ICD-10-CM | POA: Diagnosis not present

## 2019-07-06 DIAGNOSIS — M159 Polyosteoarthritis, unspecified: Secondary | ICD-10-CM | POA: Diagnosis not present

## 2019-07-06 DIAGNOSIS — I509 Heart failure, unspecified: Secondary | ICD-10-CM | POA: Diagnosis not present

## 2019-07-06 DIAGNOSIS — I2510 Atherosclerotic heart disease of native coronary artery without angina pectoris: Secondary | ICD-10-CM | POA: Diagnosis not present

## 2019-07-11 DIAGNOSIS — I5032 Chronic diastolic (congestive) heart failure: Secondary | ICD-10-CM | POA: Diagnosis not present

## 2019-07-11 DIAGNOSIS — Z9189 Other specified personal risk factors, not elsewhere classified: Secondary | ICD-10-CM | POA: Diagnosis not present

## 2019-07-11 DIAGNOSIS — I10 Essential (primary) hypertension: Secondary | ICD-10-CM | POA: Diagnosis not present

## 2019-07-11 DIAGNOSIS — H9190 Unspecified hearing loss, unspecified ear: Secondary | ICD-10-CM | POA: Diagnosis not present

## 2019-07-11 DIAGNOSIS — R54 Age-related physical debility: Secondary | ICD-10-CM | POA: Diagnosis not present

## 2019-07-11 DIAGNOSIS — N1831 Chronic kidney disease, stage 3a: Secondary | ICD-10-CM | POA: Diagnosis not present

## 2019-07-11 DIAGNOSIS — I714 Abdominal aortic aneurysm, without rupture: Secondary | ICD-10-CM | POA: Diagnosis not present

## 2019-07-11 DIAGNOSIS — Z713 Dietary counseling and surveillance: Secondary | ICD-10-CM | POA: Diagnosis not present

## 2019-07-11 DIAGNOSIS — N1832 Chronic kidney disease, stage 3b: Secondary | ICD-10-CM | POA: Diagnosis not present

## 2019-07-11 DIAGNOSIS — R5381 Other malaise: Secondary | ICD-10-CM | POA: Diagnosis not present

## 2019-07-11 DIAGNOSIS — I4891 Unspecified atrial fibrillation: Secondary | ICD-10-CM | POA: Diagnosis not present

## 2019-07-11 DIAGNOSIS — E871 Hypo-osmolality and hyponatremia: Secondary | ICD-10-CM | POA: Diagnosis not present

## 2019-07-11 DIAGNOSIS — Z955 Presence of coronary angioplasty implant and graft: Secondary | ICD-10-CM | POA: Diagnosis not present

## 2019-07-11 DIAGNOSIS — Z01818 Encounter for other preprocedural examination: Secondary | ICD-10-CM | POA: Diagnosis not present

## 2019-07-11 DIAGNOSIS — Z9181 History of falling: Secondary | ICD-10-CM | POA: Diagnosis not present

## 2019-07-13 DIAGNOSIS — I714 Abdominal aortic aneurysm, without rupture: Secondary | ICD-10-CM | POA: Diagnosis not present

## 2019-07-13 DIAGNOSIS — R2681 Unsteadiness on feet: Secondary | ICD-10-CM | POA: Diagnosis not present

## 2019-07-13 DIAGNOSIS — Z299 Encounter for prophylactic measures, unspecified: Secondary | ICD-10-CM | POA: Diagnosis not present

## 2019-07-13 DIAGNOSIS — I10 Essential (primary) hypertension: Secondary | ICD-10-CM | POA: Diagnosis not present

## 2019-07-13 DIAGNOSIS — D6869 Other thrombophilia: Secondary | ICD-10-CM | POA: Diagnosis not present

## 2019-08-01 DIAGNOSIS — Z01818 Encounter for other preprocedural examination: Secondary | ICD-10-CM | POA: Diagnosis not present

## 2019-08-01 DIAGNOSIS — I5032 Chronic diastolic (congestive) heart failure: Secondary | ICD-10-CM | POA: Diagnosis not present

## 2019-08-01 DIAGNOSIS — Z8673 Personal history of transient ischemic attack (TIA), and cerebral infarction without residual deficits: Secondary | ICD-10-CM | POA: Diagnosis not present

## 2019-08-01 DIAGNOSIS — Z79899 Other long term (current) drug therapy: Secondary | ICD-10-CM | POA: Diagnosis not present

## 2019-08-01 DIAGNOSIS — Z955 Presence of coronary angioplasty implant and graft: Secondary | ICD-10-CM | POA: Diagnosis not present

## 2019-08-01 DIAGNOSIS — I739 Peripheral vascular disease, unspecified: Secondary | ICD-10-CM | POA: Diagnosis not present

## 2019-08-01 DIAGNOSIS — M810 Age-related osteoporosis without current pathological fracture: Secondary | ICD-10-CM | POA: Diagnosis not present

## 2019-08-01 DIAGNOSIS — G8918 Other acute postprocedural pain: Secondary | ICD-10-CM | POA: Diagnosis not present

## 2019-08-01 DIAGNOSIS — Z9189 Other specified personal risk factors, not elsewhere classified: Secondary | ICD-10-CM | POA: Diagnosis not present

## 2019-08-01 DIAGNOSIS — I714 Abdominal aortic aneurysm, without rupture: Secondary | ICD-10-CM | POA: Diagnosis not present

## 2019-08-01 DIAGNOSIS — Z87898 Personal history of other specified conditions: Secondary | ICD-10-CM | POA: Diagnosis not present

## 2019-08-01 DIAGNOSIS — I4821 Permanent atrial fibrillation: Secondary | ICD-10-CM | POA: Diagnosis not present

## 2019-08-01 DIAGNOSIS — H9193 Unspecified hearing loss, bilateral: Secondary | ICD-10-CM | POA: Diagnosis not present

## 2019-08-01 DIAGNOSIS — E782 Mixed hyperlipidemia: Secondary | ICD-10-CM | POA: Diagnosis not present

## 2019-08-01 DIAGNOSIS — I10 Essential (primary) hypertension: Secondary | ICD-10-CM | POA: Diagnosis not present

## 2019-08-01 DIAGNOSIS — Z950 Presence of cardiac pacemaker: Secondary | ICD-10-CM | POA: Diagnosis not present

## 2019-08-01 DIAGNOSIS — Z7901 Long term (current) use of anticoagulants: Secondary | ICD-10-CM | POA: Diagnosis not present

## 2019-08-01 DIAGNOSIS — N183 Chronic kidney disease, stage 3 unspecified: Secondary | ICD-10-CM | POA: Diagnosis not present

## 2019-08-01 DIAGNOSIS — Z7189 Other specified counseling: Secondary | ICD-10-CM | POA: Diagnosis not present

## 2019-08-01 DIAGNOSIS — Z888 Allergy status to other drugs, medicaments and biological substances status: Secondary | ICD-10-CM | POA: Diagnosis not present

## 2019-08-01 DIAGNOSIS — E871 Hypo-osmolality and hyponatremia: Secondary | ICD-10-CM | POA: Diagnosis not present

## 2019-08-01 DIAGNOSIS — K219 Gastro-esophageal reflux disease without esophagitis: Secondary | ICD-10-CM | POA: Diagnosis not present

## 2019-08-01 DIAGNOSIS — Z20822 Contact with and (suspected) exposure to covid-19: Secondary | ICD-10-CM | POA: Diagnosis not present

## 2019-08-01 DIAGNOSIS — Z7902 Long term (current) use of antithrombotics/antiplatelets: Secondary | ICD-10-CM | POA: Diagnosis not present

## 2019-08-01 DIAGNOSIS — R7303 Prediabetes: Secondary | ICD-10-CM | POA: Diagnosis not present

## 2019-08-01 DIAGNOSIS — I150 Renovascular hypertension: Secondary | ICD-10-CM | POA: Diagnosis not present

## 2019-08-01 DIAGNOSIS — G629 Polyneuropathy, unspecified: Secondary | ICD-10-CM | POA: Diagnosis not present

## 2019-08-01 DIAGNOSIS — I2510 Atherosclerotic heart disease of native coronary artery without angina pectoris: Secondary | ICD-10-CM | POA: Diagnosis not present

## 2019-08-05 DIAGNOSIS — I10 Essential (primary) hypertension: Secondary | ICD-10-CM | POA: Diagnosis not present

## 2019-08-15 ENCOUNTER — Ambulatory Visit (INDEPENDENT_AMBULATORY_CARE_PROVIDER_SITE_OTHER): Payer: Medicare Other | Admitting: *Deleted

## 2019-08-15 DIAGNOSIS — I480 Paroxysmal atrial fibrillation: Secondary | ICD-10-CM | POA: Diagnosis not present

## 2019-08-15 LAB — CUP PACEART REMOTE DEVICE CHECK
Date Time Interrogation Session: 20210510103253
Implantable Lead Implant Date: 20150413
Implantable Lead Location: 753860
Implantable Lead Serial Number: 29601447
Implantable Pulse Generator Implant Date: 20150413
Pulse Gen Serial Number: 68133668

## 2019-08-16 NOTE — Progress Notes (Signed)
Remote pacemaker transmission.   

## 2019-08-18 DIAGNOSIS — I714 Abdominal aortic aneurysm, without rupture: Secondary | ICD-10-CM | POA: Diagnosis not present

## 2019-08-18 DIAGNOSIS — I10 Essential (primary) hypertension: Secondary | ICD-10-CM | POA: Diagnosis not present

## 2019-08-18 DIAGNOSIS — E871 Hypo-osmolality and hyponatremia: Secondary | ICD-10-CM | POA: Diagnosis not present

## 2019-08-18 DIAGNOSIS — N1831 Chronic kidney disease, stage 3a: Secondary | ICD-10-CM | POA: Diagnosis not present

## 2019-08-18 DIAGNOSIS — I5032 Chronic diastolic (congestive) heart failure: Secondary | ICD-10-CM | POA: Diagnosis not present

## 2019-08-29 DIAGNOSIS — I714 Abdominal aortic aneurysm, without rupture: Secondary | ICD-10-CM | POA: Diagnosis not present

## 2019-08-30 DIAGNOSIS — Z6824 Body mass index (BMI) 24.0-24.9, adult: Secondary | ICD-10-CM | POA: Diagnosis not present

## 2019-08-30 DIAGNOSIS — Z299 Encounter for prophylactic measures, unspecified: Secondary | ICD-10-CM | POA: Diagnosis not present

## 2019-08-30 DIAGNOSIS — Z0000 Encounter for general adult medical examination without abnormal findings: Secondary | ICD-10-CM | POA: Diagnosis not present

## 2019-08-30 DIAGNOSIS — Z7189 Other specified counseling: Secondary | ICD-10-CM | POA: Diagnosis not present

## 2019-08-30 DIAGNOSIS — Z1211 Encounter for screening for malignant neoplasm of colon: Secondary | ICD-10-CM | POA: Diagnosis not present

## 2019-09-02 DIAGNOSIS — I714 Abdominal aortic aneurysm, without rupture: Secondary | ICD-10-CM | POA: Diagnosis not present

## 2019-09-02 DIAGNOSIS — Z95828 Presence of other vascular implants and grafts: Secondary | ICD-10-CM | POA: Diagnosis not present

## 2019-09-02 DIAGNOSIS — R1909 Other intra-abdominal and pelvic swelling, mass and lump: Secondary | ICD-10-CM | POA: Diagnosis not present

## 2019-09-04 DIAGNOSIS — I4891 Unspecified atrial fibrillation: Secondary | ICD-10-CM | POA: Diagnosis not present

## 2019-09-04 DIAGNOSIS — I2510 Atherosclerotic heart disease of native coronary artery without angina pectoris: Secondary | ICD-10-CM | POA: Diagnosis not present

## 2019-09-04 DIAGNOSIS — M159 Polyosteoarthritis, unspecified: Secondary | ICD-10-CM | POA: Diagnosis not present

## 2019-09-04 DIAGNOSIS — I509 Heart failure, unspecified: Secondary | ICD-10-CM | POA: Diagnosis not present

## 2019-09-04 DIAGNOSIS — I10 Essential (primary) hypertension: Secondary | ICD-10-CM | POA: Diagnosis not present

## 2019-09-12 DIAGNOSIS — M25572 Pain in left ankle and joints of left foot: Secondary | ICD-10-CM | POA: Diagnosis not present

## 2019-09-12 DIAGNOSIS — M810 Age-related osteoporosis without current pathological fracture: Secondary | ICD-10-CM | POA: Diagnosis not present

## 2019-09-27 DIAGNOSIS — Z95828 Presence of other vascular implants and grafts: Secondary | ICD-10-CM | POA: Diagnosis not present

## 2019-09-27 DIAGNOSIS — R1909 Other intra-abdominal and pelvic swelling, mass and lump: Secondary | ICD-10-CM | POA: Diagnosis not present

## 2019-09-30 DIAGNOSIS — Z9889 Other specified postprocedural states: Secondary | ICD-10-CM | POA: Diagnosis not present

## 2019-09-30 DIAGNOSIS — Z299 Encounter for prophylactic measures, unspecified: Secondary | ICD-10-CM | POA: Diagnosis not present

## 2019-09-30 DIAGNOSIS — I509 Heart failure, unspecified: Secondary | ICD-10-CM | POA: Diagnosis not present

## 2019-09-30 DIAGNOSIS — E1136 Type 2 diabetes mellitus with diabetic cataract: Secondary | ICD-10-CM | POA: Diagnosis not present

## 2019-09-30 DIAGNOSIS — I10 Essential (primary) hypertension: Secondary | ICD-10-CM | POA: Diagnosis not present

## 2019-10-05 DIAGNOSIS — M159 Polyosteoarthritis, unspecified: Secondary | ICD-10-CM | POA: Diagnosis not present

## 2019-10-05 DIAGNOSIS — I10 Essential (primary) hypertension: Secondary | ICD-10-CM | POA: Diagnosis not present

## 2019-10-05 DIAGNOSIS — I2510 Atherosclerotic heart disease of native coronary artery without angina pectoris: Secondary | ICD-10-CM | POA: Diagnosis not present

## 2019-10-05 DIAGNOSIS — I509 Heart failure, unspecified: Secondary | ICD-10-CM | POA: Diagnosis not present

## 2019-10-05 DIAGNOSIS — I4891 Unspecified atrial fibrillation: Secondary | ICD-10-CM | POA: Diagnosis not present

## 2019-10-20 DIAGNOSIS — Z961 Presence of intraocular lens: Secondary | ICD-10-CM | POA: Diagnosis not present

## 2019-10-20 DIAGNOSIS — H1045 Other chronic allergic conjunctivitis: Secondary | ICD-10-CM | POA: Diagnosis not present

## 2019-10-27 DIAGNOSIS — J9811 Atelectasis: Secondary | ICD-10-CM | POA: Diagnosis not present

## 2019-10-27 DIAGNOSIS — I517 Cardiomegaly: Secondary | ICD-10-CM | POA: Diagnosis not present

## 2019-10-27 DIAGNOSIS — R05 Cough: Secondary | ICD-10-CM | POA: Diagnosis not present

## 2019-11-01 DIAGNOSIS — I509 Heart failure, unspecified: Secondary | ICD-10-CM | POA: Diagnosis not present

## 2019-11-01 DIAGNOSIS — N183 Chronic kidney disease, stage 3 unspecified: Secondary | ICD-10-CM | POA: Diagnosis not present

## 2019-11-01 DIAGNOSIS — Z299 Encounter for prophylactic measures, unspecified: Secondary | ICD-10-CM | POA: Diagnosis not present

## 2019-11-01 DIAGNOSIS — I4891 Unspecified atrial fibrillation: Secondary | ICD-10-CM | POA: Diagnosis not present

## 2019-11-01 DIAGNOSIS — I10 Essential (primary) hypertension: Secondary | ICD-10-CM | POA: Diagnosis not present

## 2019-11-01 DIAGNOSIS — Z6824 Body mass index (BMI) 24.0-24.9, adult: Secondary | ICD-10-CM | POA: Diagnosis not present

## 2019-11-04 DIAGNOSIS — M159 Polyosteoarthritis, unspecified: Secondary | ICD-10-CM | POA: Diagnosis not present

## 2019-11-04 DIAGNOSIS — I2510 Atherosclerotic heart disease of native coronary artery without angina pectoris: Secondary | ICD-10-CM | POA: Diagnosis not present

## 2019-11-04 DIAGNOSIS — I509 Heart failure, unspecified: Secondary | ICD-10-CM | POA: Diagnosis not present

## 2019-11-04 DIAGNOSIS — I4891 Unspecified atrial fibrillation: Secondary | ICD-10-CM | POA: Diagnosis not present

## 2019-11-04 DIAGNOSIS — I10 Essential (primary) hypertension: Secondary | ICD-10-CM | POA: Diagnosis not present

## 2019-11-04 DIAGNOSIS — M21371 Foot drop, right foot: Secondary | ICD-10-CM | POA: Diagnosis not present

## 2019-11-10 ENCOUNTER — Emergency Department (HOSPITAL_COMMUNITY)
Admission: EM | Admit: 2019-11-10 | Discharge: 2019-11-10 | Disposition: A | Discharge status: Home / Self Care | Payer: Medicare Other | Attending: Emergency Medicine | Admitting: Emergency Medicine

## 2019-11-10 ENCOUNTER — Encounter (HOSPITAL_COMMUNITY): Payer: Self-pay | Admitting: Emergency Medicine

## 2019-11-10 ENCOUNTER — Other Ambulatory Visit: Payer: Self-pay

## 2019-11-10 DIAGNOSIS — Z8673 Personal history of transient ischemic attack (TIA), and cerebral infarction without residual deficits: Secondary | ICD-10-CM | POA: Diagnosis not present

## 2019-11-10 DIAGNOSIS — Z8542 Personal history of malignant neoplasm of other parts of uterus: Secondary | ICD-10-CM | POA: Diagnosis not present

## 2019-11-10 DIAGNOSIS — R69 Illness, unspecified: Secondary | ICD-10-CM | POA: Diagnosis not present

## 2019-11-10 DIAGNOSIS — Z79899 Other long term (current) drug therapy: Secondary | ICD-10-CM | POA: Diagnosis not present

## 2019-11-10 DIAGNOSIS — E119 Type 2 diabetes mellitus without complications: Secondary | ICD-10-CM | POA: Insufficient documentation

## 2019-11-10 DIAGNOSIS — R002 Palpitations: Secondary | ICD-10-CM | POA: Diagnosis present

## 2019-11-10 DIAGNOSIS — Z7901 Long term (current) use of anticoagulants: Secondary | ICD-10-CM | POA: Insufficient documentation

## 2019-11-10 DIAGNOSIS — R6889 Other general symptoms and signs: Secondary | ICD-10-CM

## 2019-11-10 DIAGNOSIS — R5381 Other malaise: Secondary | ICD-10-CM | POA: Diagnosis not present

## 2019-11-10 DIAGNOSIS — Z955 Presence of coronary angioplasty implant and graft: Secondary | ICD-10-CM | POA: Insufficient documentation

## 2019-11-10 DIAGNOSIS — I2510 Atherosclerotic heart disease of native coronary artery without angina pectoris: Secondary | ICD-10-CM | POA: Insufficient documentation

## 2019-11-10 DIAGNOSIS — Z743 Need for continuous supervision: Secondary | ICD-10-CM | POA: Diagnosis not present

## 2019-11-10 DIAGNOSIS — I4891 Unspecified atrial fibrillation: Secondary | ICD-10-CM | POA: Diagnosis not present

## 2019-11-10 DIAGNOSIS — I10 Essential (primary) hypertension: Secondary | ICD-10-CM | POA: Diagnosis not present

## 2019-11-10 HISTORY — DX: Trigeminal neuralgia: G50.0

## 2019-11-10 LAB — BASIC METABOLIC PANEL
Anion gap: 9 (ref 5–15)
BUN: 14 mg/dL (ref 8–23)
CO2: 21 mmol/L — ABNORMAL LOW (ref 22–32)
Calcium: 9 mg/dL (ref 8.9–10.3)
Chloride: 98 mmol/L (ref 98–111)
Creatinine, Ser: 0.86 mg/dL (ref 0.44–1.00)
GFR calc Af Amer: >60 mL/min (ref >60)
GFR calc non Af Amer: 58 mL/min — ABNORMAL LOW (ref >60)
Glucose, Bld: 104 mg/dL — ABNORMAL HIGH (ref 70–99)
Potassium: 4.2 mmol/L (ref 3.5–5.1)
Sodium: 128 mmol/L — ABNORMAL LOW (ref 135–145)

## 2019-11-10 LAB — URINALYSIS, ROUTINE W REFLEX MICROSCOPIC
Bilirubin Urine: NEGATIVE
Glucose, UA: NEGATIVE mg/dL
Hgb urine dipstick: NEGATIVE
Ketones, ur: NEGATIVE mg/dL
Leukocytes,Ua: NEGATIVE
Nitrite: NEGATIVE
Protein, ur: NEGATIVE mg/dL
Specific Gravity, Urine: 1.006 (ref 1.005–1.030)
pH: 7 (ref 5.0–8.0)

## 2019-11-10 LAB — CBC WITH DIFFERENTIAL/PLATELET
Abs Immature Granulocytes: 0.07 10*3/uL (ref 0.00–0.07)
Basophils Absolute: 0.1 10*3/uL (ref 0.0–0.1)
Basophils Relative: 1 %
Eosinophils Absolute: 0.4 10*3/uL (ref 0.0–0.5)
Eosinophils Relative: 4 %
HCT: 40.6 % (ref 36.0–46.0)
Hemoglobin: 13.3 g/dL (ref 12.0–15.0)
Immature Granulocytes: 1 %
Lymphocytes Relative: 17 %
Lymphs Abs: 1.6 10*3/uL (ref 0.7–4.0)
MCH: 30.2 pg (ref 26.0–34.0)
MCHC: 32.8 g/dL (ref 30.0–36.0)
MCV: 92.1 fL (ref 80.0–100.0)
Monocytes Absolute: 1.1 10*3/uL — ABNORMAL HIGH (ref 0.1–1.0)
Monocytes Relative: 12 %
Neutro Abs: 6 10*3/uL (ref 1.7–7.7)
Neutrophils Relative %: 65 %
Platelets: 269 10*3/uL (ref 150–400)
RBC: 4.41 MIL/uL (ref 3.87–5.11)
RDW: 13.2 % (ref 11.5–15.5)
WBC: 9.1 10*3/uL (ref 4.0–10.5)
nRBC: 0 % (ref 0.0–0.2)

## 2019-11-10 NOTE — Discharge Instructions (Addendum)
Follow-up with your doctor, return to the ER for worsening or concerning symptoms.

## 2019-11-10 NOTE — ED Triage Notes (Signed)
Per EMS, pt's caretaker and friend called EMS stated pt was "feeling funny." Pt c/o intermittent RT sided numbness that is a chronic problem (trigeminal neuralgia). Pt states, "I feel like I'm in a box." Pt unsure why EMS was called, but she is AOx4 and has no complaints at this time. Caregiver told EMS she was "weepy this morning and that is unusual for her." Pt states she feels fine other than her chronic problems.
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
## 2019-11-14 ENCOUNTER — Ambulatory Visit (INDEPENDENT_AMBULATORY_CARE_PROVIDER_SITE_OTHER): Payer: Medicare Other | Admitting: *Deleted

## 2019-11-14 DIAGNOSIS — I480 Paroxysmal atrial fibrillation: Secondary | ICD-10-CM | POA: Diagnosis not present

## 2019-11-14 LAB — CUP PACEART REMOTE DEVICE CHECK
Date Time Interrogation Session: 20210809095156
Implantable Lead Implant Date: 20150413
Implantable Lead Location: 753860
Implantable Lead Serial Number: 29601447
Implantable Pulse Generator Implant Date: 20150413
Pulse Gen Serial Number: 68133668

## 2019-11-15 DIAGNOSIS — E871 Hypo-osmolality and hyponatremia: Secondary | ICD-10-CM | POA: Diagnosis not present

## 2019-11-15 DIAGNOSIS — I50812 Chronic right heart failure: Secondary | ICD-10-CM | POA: Diagnosis not present

## 2019-11-15 DIAGNOSIS — I714 Abdominal aortic aneurysm, without rupture: Secondary | ICD-10-CM | POA: Diagnosis not present

## 2019-11-15 DIAGNOSIS — X58XXXD Exposure to other specified factors, subsequent encounter: Secondary | ICD-10-CM | POA: Diagnosis not present

## 2019-11-15 DIAGNOSIS — L7634 Postprocedural seroma of skin and subcutaneous tissue following other procedure: Secondary | ICD-10-CM | POA: Diagnosis not present

## 2019-11-15 DIAGNOSIS — I5032 Chronic diastolic (congestive) heart failure: Secondary | ICD-10-CM | POA: Diagnosis not present

## 2019-11-15 DIAGNOSIS — R5382 Chronic fatigue, unspecified: Secondary | ICD-10-CM | POA: Diagnosis not present

## 2019-11-15 DIAGNOSIS — S301XXD Contusion of abdominal wall, subsequent encounter: Secondary | ICD-10-CM | POA: Diagnosis not present

## 2019-11-15 DIAGNOSIS — I110 Hypertensive heart disease with heart failure: Secondary | ICD-10-CM | POA: Diagnosis not present

## 2019-11-15 DIAGNOSIS — Z95828 Presence of other vascular implants and grafts: Secondary | ICD-10-CM | POA: Diagnosis not present

## 2019-11-15 NOTE — Progress Notes (Signed)
Remote pacemaker transmission.   

## 2019-11-18 DIAGNOSIS — R222 Localized swelling, mass and lump, trunk: Secondary | ICD-10-CM | POA: Diagnosis not present

## 2019-11-18 DIAGNOSIS — I2510 Atherosclerotic heart disease of native coronary artery without angina pectoris: Secondary | ICD-10-CM | POA: Diagnosis not present

## 2019-11-18 DIAGNOSIS — I509 Heart failure, unspecified: Secondary | ICD-10-CM | POA: Diagnosis not present

## 2019-11-18 DIAGNOSIS — M159 Polyosteoarthritis, unspecified: Secondary | ICD-10-CM | POA: Diagnosis not present

## 2019-11-18 DIAGNOSIS — L7634 Postprocedural seroma of skin and subcutaneous tissue following other procedure: Secondary | ICD-10-CM | POA: Diagnosis not present

## 2019-11-18 DIAGNOSIS — Z95828 Presence of other vascular implants and grafts: Secondary | ICD-10-CM | POA: Diagnosis not present

## 2019-11-18 DIAGNOSIS — I4891 Unspecified atrial fibrillation: Secondary | ICD-10-CM | POA: Diagnosis not present

## 2019-11-21 DIAGNOSIS — H16223 Keratoconjunctivitis sicca, not specified as Sjogren's, bilateral: Secondary | ICD-10-CM | POA: Diagnosis not present

## 2019-11-21 DIAGNOSIS — H1045 Other chronic allergic conjunctivitis: Secondary | ICD-10-CM | POA: Diagnosis not present

## 2019-11-21 DIAGNOSIS — Z961 Presence of intraocular lens: Secondary | ICD-10-CM | POA: Diagnosis not present

## 2019-11-22 DIAGNOSIS — M6281 Muscle weakness (generalized): Secondary | ICD-10-CM | POA: Diagnosis not present

## 2019-11-22 DIAGNOSIS — R2681 Unsteadiness on feet: Secondary | ICD-10-CM | POA: Diagnosis not present

## 2019-11-23 DIAGNOSIS — R5383 Other fatigue: Secondary | ICD-10-CM | POA: Diagnosis not present

## 2019-12-06 DIAGNOSIS — I10 Essential (primary) hypertension: Secondary | ICD-10-CM | POA: Diagnosis not present

## 2019-12-06 DIAGNOSIS — R2681 Unsteadiness on feet: Secondary | ICD-10-CM | POA: Diagnosis not present

## 2019-12-06 DIAGNOSIS — M6281 Muscle weakness (generalized): Secondary | ICD-10-CM | POA: Diagnosis not present

## 2019-12-08 DIAGNOSIS — R2681 Unsteadiness on feet: Secondary | ICD-10-CM | POA: Diagnosis not present

## 2019-12-08 DIAGNOSIS — M6281 Muscle weakness (generalized): Secondary | ICD-10-CM | POA: Diagnosis not present

## 2019-12-13 DIAGNOSIS — M6281 Muscle weakness (generalized): Secondary | ICD-10-CM | POA: Diagnosis not present

## 2019-12-13 DIAGNOSIS — R2681 Unsteadiness on feet: Secondary | ICD-10-CM | POA: Diagnosis not present

## 2019-12-15 DIAGNOSIS — M6281 Muscle weakness (generalized): Secondary | ICD-10-CM | POA: Diagnosis not present

## 2019-12-15 DIAGNOSIS — R2681 Unsteadiness on feet: Secondary | ICD-10-CM | POA: Diagnosis not present

## 2019-12-19 DIAGNOSIS — H16223 Keratoconjunctivitis sicca, not specified as Sjogren's, bilateral: Secondary | ICD-10-CM | POA: Diagnosis not present

## 2019-12-19 DIAGNOSIS — H02883 Meibomian gland dysfunction of right eye, unspecified eyelid: Secondary | ICD-10-CM | POA: Diagnosis not present

## 2019-12-19 DIAGNOSIS — H02886 Meibomian gland dysfunction of left eye, unspecified eyelid: Secondary | ICD-10-CM | POA: Diagnosis not present

## 2019-12-19 DIAGNOSIS — Z961 Presence of intraocular lens: Secondary | ICD-10-CM | POA: Diagnosis not present

## 2019-12-20 DIAGNOSIS — R2681 Unsteadiness on feet: Secondary | ICD-10-CM | POA: Diagnosis not present

## 2019-12-20 DIAGNOSIS — M6281 Muscle weakness (generalized): Secondary | ICD-10-CM | POA: Diagnosis not present

## 2019-12-21 DIAGNOSIS — I509 Heart failure, unspecified: Secondary | ICD-10-CM | POA: Diagnosis not present

## 2019-12-21 DIAGNOSIS — Z299 Encounter for prophylactic measures, unspecified: Secondary | ICD-10-CM | POA: Diagnosis not present

## 2019-12-21 DIAGNOSIS — I10 Essential (primary) hypertension: Secondary | ICD-10-CM | POA: Diagnosis not present

## 2019-12-21 DIAGNOSIS — R05 Cough: Secondary | ICD-10-CM | POA: Diagnosis not present

## 2019-12-21 DIAGNOSIS — I4891 Unspecified atrial fibrillation: Secondary | ICD-10-CM | POA: Diagnosis not present

## 2019-12-21 DIAGNOSIS — Z6824 Body mass index (BMI) 24.0-24.9, adult: Secondary | ICD-10-CM | POA: Diagnosis not present

## 2019-12-23 DIAGNOSIS — M6281 Muscle weakness (generalized): Secondary | ICD-10-CM | POA: Diagnosis not present

## 2019-12-23 DIAGNOSIS — R2681 Unsteadiness on feet: Secondary | ICD-10-CM | POA: Diagnosis not present

## 2020-01-05 DIAGNOSIS — Z6824 Body mass index (BMI) 24.0-24.9, adult: Secondary | ICD-10-CM | POA: Diagnosis not present

## 2020-01-05 DIAGNOSIS — R350 Frequency of micturition: Secondary | ICD-10-CM | POA: Diagnosis not present

## 2020-01-05 DIAGNOSIS — I509 Heart failure, unspecified: Secondary | ICD-10-CM | POA: Diagnosis not present

## 2020-01-05 DIAGNOSIS — I4891 Unspecified atrial fibrillation: Secondary | ICD-10-CM | POA: Diagnosis not present

## 2020-01-05 DIAGNOSIS — R2681 Unsteadiness on feet: Secondary | ICD-10-CM | POA: Diagnosis not present

## 2020-01-05 DIAGNOSIS — M6281 Muscle weakness (generalized): Secondary | ICD-10-CM | POA: Diagnosis not present

## 2020-01-05 DIAGNOSIS — Z7189 Other specified counseling: Secondary | ICD-10-CM | POA: Diagnosis not present

## 2020-01-05 DIAGNOSIS — I2510 Atherosclerotic heart disease of native coronary artery without angina pectoris: Secondary | ICD-10-CM | POA: Diagnosis not present

## 2020-01-05 DIAGNOSIS — R5383 Other fatigue: Secondary | ICD-10-CM | POA: Diagnosis not present

## 2020-01-05 DIAGNOSIS — M159 Polyosteoarthritis, unspecified: Secondary | ICD-10-CM | POA: Diagnosis not present

## 2020-01-05 DIAGNOSIS — I10 Essential (primary) hypertension: Secondary | ICD-10-CM | POA: Diagnosis not present

## 2020-01-05 DIAGNOSIS — Z299 Encounter for prophylactic measures, unspecified: Secondary | ICD-10-CM | POA: Diagnosis not present

## 2020-01-09 DIAGNOSIS — I509 Heart failure, unspecified: Secondary | ICD-10-CM | POA: Diagnosis not present

## 2020-01-09 DIAGNOSIS — N183 Chronic kidney disease, stage 3 unspecified: Secondary | ICD-10-CM | POA: Diagnosis not present

## 2020-01-09 DIAGNOSIS — Z299 Encounter for prophylactic measures, unspecified: Secondary | ICD-10-CM | POA: Diagnosis not present

## 2020-01-09 DIAGNOSIS — B356 Tinea cruris: Secondary | ICD-10-CM | POA: Diagnosis not present

## 2020-01-09 DIAGNOSIS — R350 Frequency of micturition: Secondary | ICD-10-CM | POA: Diagnosis not present

## 2020-01-09 DIAGNOSIS — I495 Sick sinus syndrome: Secondary | ICD-10-CM | POA: Diagnosis not present

## 2020-01-12 DIAGNOSIS — H16223 Keratoconjunctivitis sicca, not specified as Sjogren's, bilateral: Secondary | ICD-10-CM | POA: Diagnosis not present

## 2020-01-12 DIAGNOSIS — Z961 Presence of intraocular lens: Secondary | ICD-10-CM | POA: Diagnosis not present

## 2020-01-12 DIAGNOSIS — H02886 Meibomian gland dysfunction of left eye, unspecified eyelid: Secondary | ICD-10-CM | POA: Diagnosis not present

## 2020-01-12 DIAGNOSIS — H02883 Meibomian gland dysfunction of right eye, unspecified eyelid: Secondary | ICD-10-CM | POA: Diagnosis not present

## 2020-01-16 DIAGNOSIS — Z299 Encounter for prophylactic measures, unspecified: Secondary | ICD-10-CM | POA: Diagnosis not present

## 2020-01-16 DIAGNOSIS — R21 Rash and other nonspecific skin eruption: Secondary | ICD-10-CM | POA: Diagnosis not present

## 2020-01-16 DIAGNOSIS — I509 Heart failure, unspecified: Secondary | ICD-10-CM | POA: Diagnosis not present

## 2020-01-16 DIAGNOSIS — I739 Peripheral vascular disease, unspecified: Secondary | ICD-10-CM | POA: Diagnosis not present

## 2020-01-16 DIAGNOSIS — I714 Abdominal aortic aneurysm, without rupture: Secondary | ICD-10-CM | POA: Diagnosis not present

## 2020-01-17 DIAGNOSIS — I110 Hypertensive heart disease with heart failure: Secondary | ICD-10-CM | POA: Diagnosis not present

## 2020-01-17 DIAGNOSIS — R0602 Shortness of breath: Secondary | ICD-10-CM | POA: Diagnosis not present

## 2020-01-17 DIAGNOSIS — I5032 Chronic diastolic (congestive) heart failure: Secondary | ICD-10-CM | POA: Diagnosis not present

## 2020-01-17 DIAGNOSIS — E871 Hypo-osmolality and hyponatremia: Secondary | ICD-10-CM | POA: Diagnosis not present

## 2020-01-17 DIAGNOSIS — Z23 Encounter for immunization: Secondary | ICD-10-CM | POA: Diagnosis not present

## 2020-01-17 DIAGNOSIS — I10 Essential (primary) hypertension: Secondary | ICD-10-CM | POA: Diagnosis not present

## 2020-01-23 DIAGNOSIS — M6281 Muscle weakness (generalized): Secondary | ICD-10-CM | POA: Diagnosis not present

## 2020-01-23 DIAGNOSIS — R2681 Unsteadiness on feet: Secondary | ICD-10-CM | POA: Diagnosis not present

## 2020-01-25 DIAGNOSIS — R2681 Unsteadiness on feet: Secondary | ICD-10-CM | POA: Diagnosis not present

## 2020-01-25 DIAGNOSIS — M6281 Muscle weakness (generalized): Secondary | ICD-10-CM | POA: Diagnosis not present

## 2020-01-30 DIAGNOSIS — R2681 Unsteadiness on feet: Secondary | ICD-10-CM | POA: Diagnosis not present

## 2020-01-30 DIAGNOSIS — M6281 Muscle weakness (generalized): Secondary | ICD-10-CM | POA: Diagnosis not present

## 2020-02-01 DIAGNOSIS — R2681 Unsteadiness on feet: Secondary | ICD-10-CM | POA: Diagnosis not present

## 2020-02-01 DIAGNOSIS — M6281 Muscle weakness (generalized): Secondary | ICD-10-CM | POA: Diagnosis not present

## 2020-02-03 DIAGNOSIS — I2510 Atherosclerotic heart disease of native coronary artery without angina pectoris: Secondary | ICD-10-CM | POA: Diagnosis not present

## 2020-02-03 DIAGNOSIS — I4891 Unspecified atrial fibrillation: Secondary | ICD-10-CM | POA: Diagnosis not present

## 2020-02-03 DIAGNOSIS — I509 Heart failure, unspecified: Secondary | ICD-10-CM | POA: Diagnosis not present

## 2020-02-03 DIAGNOSIS — M159 Polyosteoarthritis, unspecified: Secondary | ICD-10-CM | POA: Diagnosis not present

## 2020-02-04 DIAGNOSIS — I10 Essential (primary) hypertension: Secondary | ICD-10-CM | POA: Diagnosis not present

## 2020-02-09 DIAGNOSIS — M6281 Muscle weakness (generalized): Secondary | ICD-10-CM | POA: Diagnosis not present

## 2020-02-09 DIAGNOSIS — R2681 Unsteadiness on feet: Secondary | ICD-10-CM | POA: Diagnosis not present

## 2020-02-13 ENCOUNTER — Ambulatory Visit (INDEPENDENT_AMBULATORY_CARE_PROVIDER_SITE_OTHER): Payer: Medicare Other

## 2020-02-13 DIAGNOSIS — Z961 Presence of intraocular lens: Secondary | ICD-10-CM | POA: Diagnosis not present

## 2020-02-13 DIAGNOSIS — H16223 Keratoconjunctivitis sicca, not specified as Sjogren's, bilateral: Secondary | ICD-10-CM | POA: Diagnosis not present

## 2020-02-13 DIAGNOSIS — I480 Paroxysmal atrial fibrillation: Secondary | ICD-10-CM

## 2020-02-13 DIAGNOSIS — H0288A Meibomian gland dysfunction right eye, upper and lower eyelids: Secondary | ICD-10-CM | POA: Diagnosis not present

## 2020-02-13 DIAGNOSIS — H0288B Meibomian gland dysfunction left eye, upper and lower eyelids: Secondary | ICD-10-CM | POA: Diagnosis not present

## 2020-02-14 LAB — CUP PACEART REMOTE DEVICE CHECK
Date Time Interrogation Session: 20211108100718
Implantable Lead Implant Date: 20150413
Implantable Lead Location: 753860
Implantable Lead Serial Number: 29601447
Implantable Pulse Generator Implant Date: 20150413
Pulse Gen Serial Number: 68133668

## 2020-02-14 NOTE — Progress Notes (Signed)
Remote pacemaker transmission.   

## 2020-02-15 DIAGNOSIS — D0471 Carcinoma in situ of skin of right lower limb, including hip: Secondary | ICD-10-CM | POA: Diagnosis not present

## 2020-02-15 DIAGNOSIS — L570 Actinic keratosis: Secondary | ICD-10-CM | POA: Diagnosis not present

## 2020-02-22 DIAGNOSIS — M6281 Muscle weakness (generalized): Secondary | ICD-10-CM | POA: Diagnosis not present

## 2020-02-22 DIAGNOSIS — R2681 Unsteadiness on feet: Secondary | ICD-10-CM | POA: Diagnosis not present

## 2020-02-24 ENCOUNTER — Encounter: Payer: Self-pay | Admitting: Internal Medicine

## 2020-02-24 ENCOUNTER — Ambulatory Visit (INDEPENDENT_AMBULATORY_CARE_PROVIDER_SITE_OTHER): Payer: Medicare Other | Admitting: Internal Medicine

## 2020-02-24 ENCOUNTER — Other Ambulatory Visit: Payer: Self-pay

## 2020-02-24 VITALS — BP 142/84 | HR 81 | Ht 61.5 in | Wt 130.6 lb

## 2020-02-24 DIAGNOSIS — I495 Sick sinus syndrome: Secondary | ICD-10-CM

## 2020-02-24 DIAGNOSIS — I4891 Unspecified atrial fibrillation: Secondary | ICD-10-CM | POA: Diagnosis not present

## 2020-02-24 LAB — CUP PACEART INCLINIC DEVICE CHECK
Brady Statistic RV Percent Paced: 98 %
Date Time Interrogation Session: 20211119151903
Implantable Lead Implant Date: 20150413
Implantable Lead Location: 753860
Implantable Lead Serial Number: 29601447
Implantable Pulse Generator Implant Date: 20150413
Lead Channel Impedance Value: 546 Ohm
Lead Channel Pacing Threshold Amplitude: 0.9 V
Lead Channel Pacing Threshold Pulse Width: 0.4 ms
Lead Channel Sensing Intrinsic Amplitude: 13.8 mV
Lead Channel Setting Pacing Amplitude: 1.3 V
Lead Channel Setting Pacing Pulse Width: 0.4 ms
Lead Channel Setting Sensing Sensitivity: 2.5 mV
Pulse Gen Serial Number: 68133668

## 2020-02-24 NOTE — Patient Instructions (Signed)
Medication Instructions:  Your physician recommends that you continue on your current medications as directed. Please refer to the Current Medication list given to you today.  *If you need a refill on your cardiac medications before your next appointment, please call your pharmacy*   Lab Work: NONE   If you have labs (blood work) drawn today and your tests are completely normal, you will receive your results only by: . MyChart Message (if you have MyChart) OR . A paper copy in the mail If you have any lab test that is abnormal or we need to change your treatment, we will call you to review the results.   Testing/Procedures: NONE    Follow-Up: At [facility], you and your health needs are our priority.  As part of our continuing mission to provide you with exceptional heart care, we have created designated Provider Care Teams.  These Care Teams include your primary Cardiologist (physician) and Advanced Practice Providers (APPs -  Physician Assistants and Nurse Practitioners) who all work together to provide you with the care you need, when you need it.  We recommend signing up for the patient portal called "MyChart".  Sign up information is provided on this After Visit Summary.  MyChart is used to connect with patients for Virtual Visits (Telemedicine).  Patients are able to view lab/test results, encounter notes, upcoming appointments, etc.  Non-urgent messages can be sent to your provider as well.   To learn more about what you can do with MyChart, go to [email or web address].    Your next appointment:   1 year(s)  The format for your next appointment:   In Person  Provider:   Gregg Taylor, MD   Other Instructions Thank you for choosing [facility]!    

## 2020-02-24 NOTE — Progress Notes (Signed)
HPI Karina [name] returns today for followup. She is a pleasant 86 yo woman with a h/o chronic atrial fib, CAD s/p stenting of the LAD remotely. She is now on a strategy of rate control. She was in the hospital with generalized weakness of no obvious etiology. The patient is still trying to walk using a walker. No edema. No syncope. No anginal symptoms.  She underwent AAA repair in April. She has minimal residual.  Allergies  Allergen Reactions  . Ace Inhibitors Hives  . Amiodarone Other (See Comments)    Worsening peripheral neuropathy  . Tikosyn [Dofetilide] Other (See Comments)    QT prolongation  . Meperidine And Related Other (See Comments)    Unknown     Current Outpatient Medications  Medication Sig Dispense Refill  . apixaban (ELIQUIS) 2.5 MG TABS tablet Take 1 tablet (2.5 mg total) 2 (two) times daily by mouth. 60 tablet 1  . citalopram (CELEXA) 20 MG tablet Take 20 mg by mouth daily.    . cloNIDine (CATAPRES) 0.1 MG tablet Take 0.1-0.2 mg by mouth 2 (two) times daily. Take 0.2mg  in morning and 0.1mg  at night    . diltiazem (CARDIZEM CD) 120 MG 24 hr capsule Take 120 mg by mouth daily.     . furosemide (LASIX) 20 MG tablet Take 20 mg by mouth daily as needed.  0  . metoprolol succinate (TOPROL-XL) 50 MG 24 hr tablet Take 50 mg by mouth 2 (two) times daily. Take with or immediately following a meal.     . mirtazapine (REMERON SOL-TAB) 15 MG disintegrating tablet Take 15 mg by mouth at bedtime.    . nitroGLYCERIN (NITROSTAT) 0.4 MG SL tablet Place 0.4 mg under the tongue every 5 (five) minutes as needed for chest pain.     . pantoprazole (PROTONIX) 40 MG tablet Take 40 mg by mouth daily.    . rosuvastatin (CRESTOR) 5 MG tablet Take 1 tablet (5 mg total) by mouth daily. 30 tablet 3  . valsartan (DIOVAN) 160 MG tablet Take 160 mg by mouth 2 (two) times daily.     No current facility-administered medications for this visit.     Past Medical History:  Diagnosis Date    . Actinic keratosis   . Bilateral leg weakness 09/27/12  . CAD (coronary artery disease)    EF 55% by 2 D Echo 2008 and 2012  . Chronic atrial fibrillation ([place])   . Chronic edema 10/15/11  . Chronic fatigue   . DDD (degenerative disc disease)   . Encounter for long-term (current) use of other medications 09/02/12  . Endometrial carcinoma ([place])   . Fibromuscular dysplasia (HCC)    S/P PCI right renal aretery 1999  . GERD (gastroesophageal reflux disease)   . Hip fracture, right ([place]) 08/06/06   S/P surgery  . Hx of cardiovascular stress test 09/2010   Nuclear stress testing showing no evidence of ischemia, EF=76%, No EKG changes & no perfusion defects.  Marland Kitchen Hx of echocardiogram 05/2012    EF >55%, LA 3.9 cm, mild LVH  . Hyperlipidemia   . Hypertension    Difficult to control  . Hyponatremia    Hypomatremia/SIADH, possibly secondary to Tegretol  . LVH (left ventricular hypertrophy)    Mild  . Osteoarthritis   . Osteoporosis    With Hx of thoracic compression fractures  . Pacemaker   . Paroxysmal atrial fibrillation (HCC)   . Peripheral neuropathy   . Peripheral vascular disease ([place])   .  Pre-diabetes 01/21/11   Chronic  . Renovascular hypertension   . Tachy-brady syndrome ([place])   . Thoracic compression fracture ([place])   . TIA (transient ischemic attack) 08/2011   OSH: flushing, left LE numbness, CT negative  . Tic douloureux 1994   S/P successful surgery  . Trigeminal neuralgia of right side of face     ROS:   All systems reviewed and negative except as noted in the HPI.   Past Surgical History:  Procedure Laterality Date  . ABDOMINAL AORTAGRAM  09/05/01   Right & left renals 25%  . APPENDECTOMY  1946  . CARDIAC CATHETERIZATION  11/05/01   EF 72%. RCA 25%. LCX 50%. Ramus 75/100%. LAD 95%. D2 75%.  . [place]  . [place]  . CHOLECYSTECTOMY  1989  . CORONARY ANGIOPLASTY    . CORONARY ANGIOPLASTY WITH STENT PLACEMENT  11/05/01   PTCA/Stent to 95%  mid LAD, Ramus lesion could not be crossed and was not treated.   Marland Kitchen DIPYRIDAMOLE STRESS TEST  09/2010   Nuclear stress testing showing no evidence of ischemia, EF=76%, No EKG changes & no perfusion defects.  . LUMBAR [place]    . ORIF FEMUR FRACTURE Right 02/16/2017   Procedure: OPEN REDUCTION INTERNAL FIXATION (ORIF) DISTAL FEMUR FRACTURE;  Surgeon: Shona Needles, MD;  Location: [facility];  Service: Orthopedics;  Laterality: Right;  . RENAL ANGIOGRAM  09/14/97   25% diffuse left renal artery. 50% ostia; right renal artery.  Marland Kitchen RENAL ARTERY ANGIOPLASTY Right 1994   Renal artery stenosis secondary to fibromusclar dysplasia  . RENAL ARTERY ANGIOPLASTY Right 09/13/97   PTA/Stent to ostial right renal artery, No distal fibromuscular hyperplasia  . SPINAL FUSION  2002   Percutaneous spinal fusion  . TOTAL ABDOMINAL HYSTERECTOMY W/ BILATERAL SALPINGOOPHORECTOMY  1990  . VAGINAL HYSTERECTOMY  1990     Family History  Problem Relation Age of Onset  . COPD Brother   . Diabetes Brother        DM  . Hypertension Brother   . Stroke Father   . Stroke Other      Social History   Socioeconomic History  . Marital status: Widowed    Spouse name: Not on file  . Number of children: Not on file  . Years of education: Not on file  . Highest education level: Not on file  Occupational History  . Not on file  Tobacco Use  . Smoking status: Never Smoker  . Smokeless tobacco: Never Used  Vaping Use  . Vaping Use: Never used  Substance and Sexual Activity  . Alcohol use: Yes    Alcohol/week: 0.0 standard drinks    Comment: Rarely  . Drug use: No  . Sexual activity: Not Currently  Other Topics Concern  . Not on file  Social History Narrative  . Not on file   Social Determinants of Health   Financial Resource Strain:   . Difficulty of Paying Living Expenses: Not on file  Food Insecurity:   . Worried About Charity fundraiser in the Last Year: Not on file  . Ran Out of Food in the Last  Year: Not on file  Transportation Needs:   . Lack of Transportation (Medical): Not on file  . Lack of Transportation (Non-Medical): Not on file  Physical Activity:   . Days of Exercise per Week: Not on file  . Minutes of Exercise per Session: Not on file  Stress:   .  Feeling of Stress : Not on file  Social Connections:   . Frequency of Communication with Friends and Family: Not on file  . Frequency of Social Gatherings with Friends and Family: Not on file  . Attends Religious Services: Not on file  . Active Member of Clubs or Organizations: Not on file  . Attends Archivist Meetings: Not on file  . Marital Status: Not on file  Intimate Partner Violence:   . Fear of Current or Ex-Partner: Not on file  . Emotionally Abused: Not on file  . Physically Abused: Not on file  . Sexually Abused: Not on file     BP (!) 142/84   Pulse 81   Ht 5' 1.5" (1.562 m)   Wt 130 lb 9.6 oz (59.2 kg)   SpO2 97%   BMI 24.28 kg/m   Physical Exam:  Well appearing NAD HEENT: Unremarkable Neck:  No JVD, no thyromegally Lymphatics:  No adenopathy Back:  No CVA tenderness Lungs:  Clear HEART:  Regular rate rhythm, no murmurs, no rubs, no clicks Abd:  soft, positive bowel sounds, no organomegally, no rebound, no guarding Ext:  2 plus pulses, no edema, no cyanosis, no clubbing Skin:  No rashes no nodules Neuro:  CN II through XII intact, motor grossly intact  EKG - atrial fib with ventricular pacing  DEVICE  Normal device function.  See PaceArt for details.   Assess/Plan: 1. Atrial fib - her VR is controlled. 2. CHB - she has an underlying rate in the low 40's today. She is asymptomatic,s/p PPM. 3. PPM -her Biotronik VVIR PPM is working normally. 4. CAD - she denies anginal symptoms.   Carleene Overlie Gustabo Gordillo,MD

## 2020-02-27 DIAGNOSIS — I714 Abdominal aortic aneurysm, without rupture: Secondary | ICD-10-CM | POA: Diagnosis not present

## 2020-02-27 DIAGNOSIS — Z95828 Presence of other vascular implants and grafts: Secondary | ICD-10-CM | POA: Diagnosis not present

## 2020-02-28 DIAGNOSIS — R2681 Unsteadiness on feet: Secondary | ICD-10-CM | POA: Diagnosis not present

## 2020-02-28 DIAGNOSIS — M6281 Muscle weakness (generalized): Secondary | ICD-10-CM | POA: Diagnosis not present

## 2020-03-06 DIAGNOSIS — I10 Essential (primary) hypertension: Secondary | ICD-10-CM | POA: Diagnosis not present

## 2020-03-06 DIAGNOSIS — M159 Polyosteoarthritis, unspecified: Secondary | ICD-10-CM | POA: Diagnosis not present

## 2020-03-06 DIAGNOSIS — I509 Heart failure, unspecified: Secondary | ICD-10-CM | POA: Diagnosis not present

## 2020-03-06 DIAGNOSIS — I2510 Atherosclerotic heart disease of native coronary artery without angina pectoris: Secondary | ICD-10-CM | POA: Diagnosis not present

## 2020-03-06 DIAGNOSIS — I4891 Unspecified atrial fibrillation: Secondary | ICD-10-CM | POA: Diagnosis not present

## 2020-03-07 DIAGNOSIS — L570 Actinic keratosis: Secondary | ICD-10-CM | POA: Diagnosis not present

## 2020-03-07 DIAGNOSIS — D0471 Carcinoma in situ of skin of right lower limb, including hip: Secondary | ICD-10-CM | POA: Diagnosis not present

## 2020-03-08 DIAGNOSIS — M6281 Muscle weakness (generalized): Secondary | ICD-10-CM | POA: Diagnosis not present

## 2020-03-08 DIAGNOSIS — R2681 Unsteadiness on feet: Secondary | ICD-10-CM | POA: Diagnosis not present

## 2020-03-21 DIAGNOSIS — Z95828 Presence of other vascular implants and grafts: Secondary | ICD-10-CM | POA: Diagnosis not present

## 2020-03-21 DIAGNOSIS — L7634 Postprocedural seroma of skin and subcutaneous tissue following other procedure: Secondary | ICD-10-CM | POA: Diagnosis not present

## 2020-03-21 DIAGNOSIS — I723 Aneurysm of iliac artery: Secondary | ICD-10-CM | POA: Diagnosis not present

## 2020-03-21 DIAGNOSIS — I714 Abdominal aortic aneurysm, without rupture: Secondary | ICD-10-CM | POA: Diagnosis not present

## 2020-04-05 DIAGNOSIS — M159 Polyosteoarthritis, unspecified: Secondary | ICD-10-CM | POA: Diagnosis not present

## 2020-04-05 DIAGNOSIS — I10 Essential (primary) hypertension: Secondary | ICD-10-CM | POA: Diagnosis not present

## 2020-04-05 DIAGNOSIS — I509 Heart failure, unspecified: Secondary | ICD-10-CM | POA: Diagnosis not present

## 2020-04-05 DIAGNOSIS — I2510 Atherosclerotic heart disease of native coronary artery without angina pectoris: Secondary | ICD-10-CM | POA: Diagnosis not present

## 2020-04-05 DIAGNOSIS — I4891 Unspecified atrial fibrillation: Secondary | ICD-10-CM | POA: Diagnosis not present

## 2020-04-06 DIAGNOSIS — M6281 Muscle weakness (generalized): Secondary | ICD-10-CM | POA: Diagnosis not present

## 2020-04-06 DIAGNOSIS — R2681 Unsteadiness on feet: Secondary | ICD-10-CM | POA: Diagnosis not present

## 2020-04-11 DIAGNOSIS — M6281 Muscle weakness (generalized): Secondary | ICD-10-CM | POA: Diagnosis not present

## 2020-04-11 DIAGNOSIS — R2681 Unsteadiness on feet: Secondary | ICD-10-CM | POA: Diagnosis not present

## 2020-04-12 DIAGNOSIS — H0288A Meibomian gland dysfunction right eye, upper and lower eyelids: Secondary | ICD-10-CM | POA: Diagnosis not present

## 2020-04-12 DIAGNOSIS — H0288B Meibomian gland dysfunction left eye, upper and lower eyelids: Secondary | ICD-10-CM | POA: Diagnosis not present

## 2020-04-12 DIAGNOSIS — H16223 Keratoconjunctivitis sicca, not specified as Sjogren's, bilateral: Secondary | ICD-10-CM | POA: Diagnosis not present

## 2020-04-12 DIAGNOSIS — Z961 Presence of intraocular lens: Secondary | ICD-10-CM | POA: Diagnosis not present

## 2020-04-17 DIAGNOSIS — M6281 Muscle weakness (generalized): Secondary | ICD-10-CM | POA: Diagnosis not present

## 2020-04-17 DIAGNOSIS — R2681 Unsteadiness on feet: Secondary | ICD-10-CM | POA: Diagnosis not present

## 2020-04-20 DIAGNOSIS — I4891 Unspecified atrial fibrillation: Secondary | ICD-10-CM | POA: Diagnosis not present

## 2020-04-20 DIAGNOSIS — Z789 Other specified health status: Secondary | ICD-10-CM | POA: Diagnosis not present

## 2020-04-20 DIAGNOSIS — Z9889 Other specified postprocedural states: Secondary | ICD-10-CM | POA: Diagnosis not present

## 2020-04-20 DIAGNOSIS — Z6824 Body mass index (BMI) 24.0-24.9, adult: Secondary | ICD-10-CM | POA: Diagnosis not present

## 2020-04-20 DIAGNOSIS — Z299 Encounter for prophylactic measures, unspecified: Secondary | ICD-10-CM | POA: Diagnosis not present

## 2020-04-20 DIAGNOSIS — S22080A Wedge compression fracture of T11-T12 vertebra, initial encounter for closed fracture: Secondary | ICD-10-CM | POA: Diagnosis not present

## 2020-04-25 DIAGNOSIS — R2681 Unsteadiness on feet: Secondary | ICD-10-CM | POA: Diagnosis not present

## 2020-04-25 DIAGNOSIS — M6281 Muscle weakness (generalized): Secondary | ICD-10-CM | POA: Diagnosis not present

## 2020-05-01 DIAGNOSIS — M6281 Muscle weakness (generalized): Secondary | ICD-10-CM | POA: Diagnosis not present

## 2020-05-01 DIAGNOSIS — R2681 Unsteadiness on feet: Secondary | ICD-10-CM | POA: Diagnosis not present

## 2020-05-07 DIAGNOSIS — I10 Essential (primary) hypertension: Secondary | ICD-10-CM | POA: Diagnosis not present

## 2020-05-08 DIAGNOSIS — R2681 Unsteadiness on feet: Secondary | ICD-10-CM | POA: Diagnosis not present

## 2020-05-08 DIAGNOSIS — M6281 Muscle weakness (generalized): Secondary | ICD-10-CM | POA: Diagnosis not present

## 2020-05-14 ENCOUNTER — Ambulatory Visit (INDEPENDENT_AMBULATORY_CARE_PROVIDER_SITE_OTHER): Payer: Medicare Other

## 2020-05-14 DIAGNOSIS — I495 Sick sinus syndrome: Secondary | ICD-10-CM | POA: Diagnosis not present

## 2020-05-15 DIAGNOSIS — E871 Hypo-osmolality and hyponatremia: Secondary | ICD-10-CM | POA: Diagnosis not present

## 2020-05-15 DIAGNOSIS — I10 Essential (primary) hypertension: Secondary | ICD-10-CM | POA: Diagnosis not present

## 2020-05-15 DIAGNOSIS — I5032 Chronic diastolic (congestive) heart failure: Secondary | ICD-10-CM | POA: Diagnosis not present

## 2020-05-15 DIAGNOSIS — H903 Sensorineural hearing loss, bilateral: Secondary | ICD-10-CM | POA: Diagnosis not present

## 2020-05-15 DIAGNOSIS — I110 Hypertensive heart disease with heart failure: Secondary | ICD-10-CM | POA: Diagnosis not present

## 2020-05-15 DIAGNOSIS — I4821 Permanent atrial fibrillation: Secondary | ICD-10-CM | POA: Diagnosis not present

## 2020-05-16 DIAGNOSIS — R2681 Unsteadiness on feet: Secondary | ICD-10-CM | POA: Diagnosis not present

## 2020-05-16 DIAGNOSIS — M6281 Muscle weakness (generalized): Secondary | ICD-10-CM | POA: Diagnosis not present

## 2020-05-16 LAB — CUP PACEART REMOTE DEVICE CHECK
Battery Remaining Percentage: 60 %
Brady Statistic RV Percent Paced: 98 %
Date Time Interrogation Session: 20220205073740
Implantable Lead Implant Date: 20150413
Implantable Lead Location: 753860
Implantable Lead Serial Number: 29601447
Implantable Pulse Generator Implant Date: 20150413
Lead Channel Impedance Value: 605 Ohm
Lead Channel Pacing Threshold Amplitude: 0.7 V
Lead Channel Pacing Threshold Pulse Width: 0.4 ms
Lead Channel Sensing Intrinsic Amplitude: 7.8 mV
Lead Channel Setting Pacing Amplitude: 1.3 V
Lead Channel Setting Pacing Pulse Width: 0.4 ms
Lead Channel Setting Sensing Sensitivity: 2.5 mV
Pulse Gen Serial Number: 68133668

## 2020-05-18 NOTE — Progress Notes (Signed)
Remote pacemaker transmission.   

## 2020-05-22 DIAGNOSIS — R2681 Unsteadiness on feet: Secondary | ICD-10-CM | POA: Diagnosis not present

## 2020-05-22 DIAGNOSIS — M6281 Muscle weakness (generalized): Secondary | ICD-10-CM | POA: Diagnosis not present

## 2020-06-04 DIAGNOSIS — I10 Essential (primary) hypertension: Secondary | ICD-10-CM | POA: Diagnosis not present

## 2020-07-05 DIAGNOSIS — I10 Essential (primary) hypertension: Secondary | ICD-10-CM | POA: Diagnosis not present

## 2020-07-09 DIAGNOSIS — L821 Other seborrheic keratosis: Secondary | ICD-10-CM | POA: Diagnosis not present

## 2020-07-09 DIAGNOSIS — Z85828 Personal history of other malignant neoplasm of skin: Secondary | ICD-10-CM | POA: Diagnosis not present

## 2020-07-09 DIAGNOSIS — L570 Actinic keratosis: Secondary | ICD-10-CM | POA: Diagnosis not present

## 2020-07-09 DIAGNOSIS — C4441 Basal cell carcinoma of skin of scalp and neck: Secondary | ICD-10-CM | POA: Diagnosis not present

## 2020-07-17 DIAGNOSIS — E876 Hypokalemia: Secondary | ICD-10-CM | POA: Diagnosis not present

## 2020-07-18 DIAGNOSIS — Z299 Encounter for prophylactic measures, unspecified: Secondary | ICD-10-CM | POA: Diagnosis not present

## 2020-07-18 DIAGNOSIS — I10 Essential (primary) hypertension: Secondary | ICD-10-CM | POA: Diagnosis not present

## 2020-07-18 DIAGNOSIS — I714 Abdominal aortic aneurysm, without rupture: Secondary | ICD-10-CM | POA: Diagnosis not present

## 2020-07-18 DIAGNOSIS — R5383 Other fatigue: Secondary | ICD-10-CM | POA: Diagnosis not present

## 2020-07-18 DIAGNOSIS — I739 Peripheral vascular disease, unspecified: Secondary | ICD-10-CM | POA: Diagnosis not present

## 2020-07-18 DIAGNOSIS — I509 Heart failure, unspecified: Secondary | ICD-10-CM | POA: Diagnosis not present

## 2020-08-03 DIAGNOSIS — I10 Essential (primary) hypertension: Secondary | ICD-10-CM | POA: Diagnosis not present

## 2020-08-06 DIAGNOSIS — I4891 Unspecified atrial fibrillation: Secondary | ICD-10-CM | POA: Diagnosis not present

## 2020-08-06 DIAGNOSIS — Z299 Encounter for prophylactic measures, unspecified: Secondary | ICD-10-CM | POA: Diagnosis not present

## 2020-08-06 DIAGNOSIS — I509 Heart failure, unspecified: Secondary | ICD-10-CM | POA: Diagnosis not present

## 2020-08-06 DIAGNOSIS — N183 Chronic kidney disease, stage 3 unspecified: Secondary | ICD-10-CM | POA: Diagnosis not present

## 2020-08-06 DIAGNOSIS — I10 Essential (primary) hypertension: Secondary | ICD-10-CM | POA: Diagnosis not present

## 2020-08-16 DIAGNOSIS — B351 Tinea unguium: Secondary | ICD-10-CM | POA: Diagnosis not present

## 2020-08-16 DIAGNOSIS — M79676 Pain in unspecified toe(s): Secondary | ICD-10-CM | POA: Diagnosis not present

## 2020-08-27 ENCOUNTER — Other Ambulatory Visit: Payer: Self-pay

## 2020-08-27 ENCOUNTER — Encounter (HOSPITAL_COMMUNITY): Payer: Self-pay

## 2020-08-27 ENCOUNTER — Emergency Department (HOSPITAL_COMMUNITY)
Admission: EM | Admit: 2020-08-27 | Discharge: 2020-08-27 | Disposition: A | Discharge status: Home / Self Care | Payer: Medicare Other | Attending: Emergency Medicine | Admitting: Emergency Medicine

## 2020-08-27 DIAGNOSIS — Z743 Need for continuous supervision: Secondary | ICD-10-CM | POA: Diagnosis not present

## 2020-08-27 DIAGNOSIS — Z8673 Personal history of transient ischemic attack (TIA), and cerebral infarction without residual deficits: Secondary | ICD-10-CM | POA: Diagnosis not present

## 2020-08-27 DIAGNOSIS — R9431 Abnormal electrocardiogram [ECG] [EKG]: Secondary | ICD-10-CM | POA: Insufficient documentation

## 2020-08-27 DIAGNOSIS — Z951 Presence of aortocoronary bypass graft: Secondary | ICD-10-CM | POA: Diagnosis not present

## 2020-08-27 DIAGNOSIS — R6889 Other general symptoms and signs: Secondary | ICD-10-CM | POA: Diagnosis not present

## 2020-08-27 DIAGNOSIS — R531 Weakness: Secondary | ICD-10-CM

## 2020-08-27 DIAGNOSIS — I499 Cardiac arrhythmia, unspecified: Secondary | ICD-10-CM | POA: Diagnosis not present

## 2020-08-27 DIAGNOSIS — I2510 Atherosclerotic heart disease of native coronary artery without angina pectoris: Secondary | ICD-10-CM | POA: Insufficient documentation

## 2020-08-27 DIAGNOSIS — I10 Essential (primary) hypertension: Secondary | ICD-10-CM | POA: Insufficient documentation

## 2020-08-27 DIAGNOSIS — Z7901 Long term (current) use of anticoagulants: Secondary | ICD-10-CM | POA: Diagnosis not present

## 2020-08-27 DIAGNOSIS — Z950 Presence of cardiac pacemaker: Secondary | ICD-10-CM | POA: Diagnosis not present

## 2020-08-27 DIAGNOSIS — R0689 Other abnormalities of breathing: Secondary | ICD-10-CM | POA: Diagnosis not present

## 2020-08-27 DIAGNOSIS — Z79899 Other long term (current) drug therapy: Secondary | ICD-10-CM | POA: Insufficient documentation

## 2020-08-27 DIAGNOSIS — Z20822 Contact with and (suspected) exposure to covid-19: Secondary | ICD-10-CM | POA: Diagnosis not present

## 2020-08-27 DIAGNOSIS — R079 Chest pain, unspecified: Secondary | ICD-10-CM | POA: Diagnosis not present

## 2020-08-27 LAB — BASIC METABOLIC PANEL
Anion gap: 10 (ref 5–15)
BUN: 14 mg/dL (ref 8–23)
CO2: 27 mmol/L (ref 22–32)
Calcium: 9.6 mg/dL (ref 8.9–10.3)
Chloride: 93 mmol/L — ABNORMAL LOW (ref 98–111)
Creatinine, Ser: 0.81 mg/dL (ref 0.44–1.00)
GFR, Estimated: >60 mL/min (ref >60)
Glucose, Bld: 149 mg/dL — ABNORMAL HIGH (ref 70–99)
Potassium: 3.3 mmol/L — ABNORMAL LOW (ref 3.5–5.1)
Sodium: 130 mmol/L — ABNORMAL LOW (ref 135–145)

## 2020-08-27 LAB — CBC WITH DIFFERENTIAL/PLATELET
Abs Immature Granulocytes: 0.05 10*3/uL (ref 0.00–0.07)
Basophils Absolute: 0.1 10*3/uL (ref 0.0–0.1)
Basophils Relative: 1 %
Eosinophils Absolute: 0.1 10*3/uL (ref 0.0–0.5)
Eosinophils Relative: 2 %
HCT: 41.6 % (ref 36.0–46.0)
Hemoglobin: 13.6 g/dL (ref 12.0–15.0)
Immature Granulocytes: 1 %
Lymphocytes Relative: 12 %
Lymphs Abs: 1 10*3/uL (ref 0.7–4.0)
MCH: 29.9 pg (ref 26.0–34.0)
MCHC: 32.7 g/dL (ref 30.0–36.0)
MCV: 91.4 fL (ref 80.0–100.0)
Monocytes Absolute: 0.8 10*3/uL (ref 0.1–1.0)
Monocytes Relative: 10 %
Neutro Abs: 6.2 10*3/uL (ref 1.7–7.7)
Neutrophils Relative %: 74 %
Platelets: 264 10*3/uL (ref 150–400)
RBC: 4.55 MIL/uL (ref 3.87–5.11)
RDW: 13 % (ref 11.5–15.5)
WBC: 8.3 10*3/uL (ref 4.0–10.5)
nRBC: 0 % (ref 0.0–0.2)

## 2020-08-27 LAB — TROPONIN I (HIGH SENSITIVITY): Troponin I (High Sensitivity): 6 ng/L (ref <18)

## 2020-08-27 NOTE — ED Triage Notes (Signed)
Pt presents to ED via [facility], sent from Urgent Care for abnormal EKG. EKG showed junctional rhythm. HR 70's then went to 120's. Pt denies chest pain.

## 2020-08-27 NOTE — ED Provider Notes (Signed)
[facility] Provider Note  CSN: [id number] Arrival date & time: 08/27/20 1524    History Chief Complaint  Patient presents with  . Abnormal ECG    HPI  Karina [name] is a 87 y.o. female with a long history of CAD, afib, sick sinus with a PPM who still lives independently and is active around the house reports she has been feeling generally weak for about 2 weeks. No specific symptom but just less energy than usual. As an example she reports she usually walks ten laps around the outside of her house every day but recently has only been able to go six. She has not had any fever, cough, congestion, SOB, N/V/D or dysuria. She did have one brief episode of chest pain 4 days ago while cleaning her porch but it resolved after just a few minutes. She went to a local UC today because she thought she ought to get tested for Covid and while there she had an EKG which was abnormal, prompting her to be sent to the ED for evaluation.    Past Medical History:  Diagnosis Date  . Actinic keratosis   . Bilateral leg weakness 09/27/12  . CAD (coronary artery disease)    EF 55% by 2 D Echo 2008 and 2012  . Chronic atrial fibrillation ([place])   . Chronic edema 10/15/11  . Chronic fatigue   . DDD (degenerative disc disease)   . Encounter for long-term (current) use of other medications 09/02/12  . Endometrial carcinoma ([place])   . Fibromuscular dysplasia (HCC)    S/P PCI right renal aretery 1999  . GERD (gastroesophageal reflux disease)   . Hip fracture, right ([place]) 08/06/06   S/P surgery  . Hx of cardiovascular stress test 09/2010   Nuclear stress testing showing no evidence of ischemia, EF=76%, No EKG changes & no perfusion defects.  Marland Kitchen Hx of echocardiogram 05/2012    EF >55%, LA 3.9 cm, mild LVH  . Hyperlipidemia   . Hypertension    Difficult to control  . Hyponatremia    Hypomatremia/SIADH, possibly secondary to Tegretol  . LVH (left ventricular hypertrophy)    Mild  .  Osteoarthritis   . Osteoporosis    With Hx of thoracic compression fractures  . Pacemaker   . Paroxysmal atrial fibrillation (HCC)   . Peripheral neuropathy   . Peripheral vascular disease ([place])   . Pre-diabetes 01/21/11   Chronic  . Renovascular hypertension   . Tachy-brady syndrome ([place])   . Thoracic compression fracture ([place])   . TIA (transient ischemic attack) 08/2011   OSH: flushing, left LE numbness, CT negative  . Tic douloureux 1994   S/P successful surgery  . Trigeminal neuralgia of right side of face     Past Surgical History:  Procedure Laterality Date  . ABDOMINAL AORTAGRAM  09/05/01   Right & left renals 25%  . APPENDECTOMY  1946  . CARDIAC CATHETERIZATION  11/05/01   EF 72%. RCA 25%. LCX 50%. Ramus 75/100%. LAD 95%. D2 75%.  . [place]  . [place]  . CHOLECYSTECTOMY  1989  . CORONARY ANGIOPLASTY    . CORONARY ANGIOPLASTY WITH STENT PLACEMENT  11/05/01   PTCA/Stent to 95% mid LAD, Ramus lesion could not be crossed and was not treated.   Marland Kitchen DIPYRIDAMOLE STRESS TEST  09/2010   Nuclear stress testing showing no evidence of ischemia, EF=76%, No EKG changes & no perfusion defects.  . LUMBAR [place]    .  ORIF FEMUR FRACTURE Right 02/16/2017   Procedure: OPEN REDUCTION INTERNAL FIXATION (ORIF) DISTAL FEMUR FRACTURE;  Surgeon: Shona Needles, MD;  Location: [facility];  Service: Orthopedics;  Laterality: Right;  . RENAL ANGIOGRAM  09/14/97   25% diffuse left renal artery. 50% ostia; right renal artery.  Marland Kitchen RENAL ARTERY ANGIOPLASTY Right 1994   Renal artery stenosis secondary to fibromusclar dysplasia  . RENAL ARTERY ANGIOPLASTY Right 09/13/97   PTA/Stent to ostial right renal artery, No distal fibromuscular hyperplasia  . SPINAL FUSION  2002   Percutaneous spinal fusion  . TOTAL ABDOMINAL HYSTERECTOMY W/ BILATERAL SALPINGOOPHORECTOMY  1990  . VAGINAL HYSTERECTOMY  1990    Family History  Problem Relation Age of Onset  . COPD Brother   .  Diabetes Brother        DM  . Hypertension Brother   . Stroke Father   . Stroke Other     Social History   Tobacco Use  . Smoking status: Never Smoker  . Smokeless tobacco: Never Used  Vaping Use  . Vaping Use: Never used  Substance Use Topics  . Alcohol use: Yes    Alcohol/week: 0.0 standard drinks    Comment: Rarely  . Drug use: No     Home Medications Prior to Admission medications   Medication Sig Start Date End Date Taking? Authorizing Provider  ammonium lactate (LAC-HYDRIN) 12 % lotion Apply topically daily. 08/16/20  Yes [provider]  apixaban (ELIQUIS) 2.5 MG TABS tablet Take 1 tablet (2.5 mg total) 2 (two) times daily by mouth. 02/23/17  Yes Eloise Levels, MD  Cholecalciferol (VITAMIN D3) 25 MCG (1000 UT) CAPS Take 1 capsule by mouth daily.   Yes [provider]  citalopram (CELEXA) 20 MG tablet Take 20 mg by mouth daily.   Yes [provider]  cloNIDine (CATAPRES) 0.1 MG tablet Take 0.1 mg by mouth 3 (three) times daily. 05/07/09  Yes [provider]  diltiazem (CARDIZEM CD) 120 MG 24 hr capsule Take 120 mg by mouth daily.  11/15/14  Yes [provider]  diltiazem (TIAZAC) 120 MG 24 hr capsule Take 120 mg by mouth daily. 07/26/19  Yes [provider]  furosemide (LASIX) 20 MG tablet Take 20 mg by mouth daily as needed. 11/11/17  Yes [provider]  metoprolol succinate (TOPROL-XL) 50 MG 24 hr tablet Take 50 mg by mouth 2 (two) times daily. Take with or immediately following a meal.   Yes [provider]  mirtazapine (REMERON SOL-TAB) 15 MG disintegrating tablet Take 15 mg by mouth at bedtime.   Yes [provider]  pantoprazole (PROTONIX) 40 MG tablet Take 40 mg by mouth daily.   Yes [provider]  polyethylene glycol powder (GLYCOLAX/MIRALAX) 17 GM/SCOOP powder Take by mouth. 02/23/17  Yes [provider]  potassium chloride (MICRO-K) 10 MEQ CR capsule Take 10 mEq by  mouth daily. 07/14/20  Yes [provider]  rosuvastatin (CRESTOR) 5 MG tablet Take 1 tablet (5 mg total) by mouth daily. 12/21/18  Yes Emokpae, Courage, MD  valsartan (DIOVAN) 160 MG tablet Take 160 mg by mouth 2 (two) times daily.   Yes [provider]  zinc gluconate 50 MG tablet Take 50 mg by mouth daily.   Yes [provider]  cycloSPORINE (RESTASIS) 0.05 % ophthalmic emulsion Apply to eye. Patient not taking: No sig reported 11/21/19   [provider]  nitroGLYCERIN (NITROSTAT) 0.4 MG SL tablet Place 0.4 mg under the tongue  every 5 (five) minutes as needed for chest pain.  Patient not taking: No sig reported 04/10/10   [provider]     Allergies    Ace inhibitors, Amiodarone, Tikosyn [dofetilide], and Meperidine and related   Review of Systems   Review of Systems A comprehensive review of systems was completed and negative except as noted in HPI.    Physical Exam BP (!) 158/93   Pulse 81   Temp 98.3 F (36.8 C) (Oral)   Resp 13   Ht 5' 1.5" (1.562 m)   Wt 54.4 kg   SpO2 97%   BMI 22.31 kg/m   Physical Exam Vitals and nursing note reviewed.  Constitutional:      Appearance: Normal appearance.  HENT:     Head: Normocephalic and atraumatic.     Nose: Nose normal.     Mouth/Throat:     Mouth: Mucous membranes are moist.  Eyes:     Extraocular Movements: Extraocular movements intact.     Conjunctiva/sclera: Conjunctivae normal.  Cardiovascular:     Rate and Rhythm: Normal rate.  Pulmonary:     Effort: Pulmonary effort is normal.     Breath sounds: Normal breath sounds.  Abdominal:     General: Abdomen is flat.     Palpations: Abdomen is soft.     Tenderness: There is no abdominal tenderness.  Musculoskeletal:        General: No swelling. Normal range of motion.     Cervical back: Neck supple.     Comments: Trace LE edema  Skin:    General: Skin is warm and dry.  Neurological:     General: No focal deficit present.      Mental Status: She is alert.  Psychiatric:        Mood and Affect: Mood normal.      ED Results / Procedures / Treatments   Labs (all labs ordered are listed, but only abnormal results are displayed) Labs Reviewed  BASIC METABOLIC PANEL - Abnormal; Notable for the following components:      Result Value   Sodium 130 (*)    Potassium 3.3 (*)    Chloride 93 (*)    Glucose, Bld 149 (*)    All other components within normal limits  CBC WITH DIFFERENTIAL/PLATELET  TROPONIN I (HIGH SENSITIVITY)    EKG EKG Interpretation  Date/Time:  Monday Aug 27 2020 15:34:58 EDT Ventricular Rate:  76 PR Interval:  51 QRS Duration: 138 QT Interval:  419 QTC Calculation: 472 R Axis:   110 Text Interpretation: V-paced Abnormal ECG Confirmed by Calvert Cantor [id number]) on 08/27/2020 3:58:57 PM   Radiology No results found.  Procedures Procedures  Medications Ordered in the ED Medications - No data to display   MDM Rules/Calculators/A&P MDM Patient EKG shows pacer spikes, similar to previous here. No acute ischemic changes. Patient would still like to be evaluated for her general weakness. She did have a neg Covid Ag and a Covid PCR collected at [facility]. PCR is not yet resulted.  ED Course  I have reviewed the triage vital signs and the nursing notes.  Pertinent labs & imaging results that were available during my care of the patient were reviewed by me and considered in my medical decision making (see chart for details).  Clinical Course as of 08/27/20 1845  Mon Aug 27, 2020  1727 CBC is normal.  [CS]  1742 BMP with mild hyponatremia and hypokalemia, not in need of repletion. Trop is  neg >36 hours after reported episode of chest pain.  [CS]  9381 Patient remains non-toxic, no hypoxia. Plan discharge home with PCP follow up. UC Covid is pending.  [CS]    Clinical Course User Index [CS] Truddie Hidden, MD    Final Clinical Impression(s) / ED Diagnoses Final diagnoses:   Generalized weakness    Rx / DC Orders ED Discharge Orders    None       Truddie Hidden, MD 08/27/20 1845

## 2020-08-30 DIAGNOSIS — I10 Essential (primary) hypertension: Secondary | ICD-10-CM | POA: Diagnosis not present

## 2020-08-30 DIAGNOSIS — Z299 Encounter for prophylactic measures, unspecified: Secondary | ICD-10-CM | POA: Diagnosis not present

## 2020-08-30 DIAGNOSIS — Z0000 Encounter for general adult medical examination without abnormal findings: Secondary | ICD-10-CM | POA: Diagnosis not present

## 2020-08-30 DIAGNOSIS — D692 Other nonthrombocytopenic purpura: Secondary | ICD-10-CM | POA: Diagnosis not present

## 2020-08-30 DIAGNOSIS — I509 Heart failure, unspecified: Secondary | ICD-10-CM | POA: Diagnosis not present

## 2020-08-30 DIAGNOSIS — Z7189 Other specified counseling: Secondary | ICD-10-CM | POA: Diagnosis not present

## 2020-09-03 DIAGNOSIS — M159 Polyosteoarthritis, unspecified: Secondary | ICD-10-CM | POA: Diagnosis not present

## 2020-09-03 DIAGNOSIS — I10 Essential (primary) hypertension: Secondary | ICD-10-CM | POA: Diagnosis not present

## 2020-09-03 DIAGNOSIS — I2510 Atherosclerotic heart disease of native coronary artery without angina pectoris: Secondary | ICD-10-CM | POA: Diagnosis not present

## 2020-09-04 DIAGNOSIS — I10 Essential (primary) hypertension: Secondary | ICD-10-CM | POA: Diagnosis not present

## 2020-09-26 DIAGNOSIS — I4891 Unspecified atrial fibrillation: Secondary | ICD-10-CM | POA: Diagnosis not present

## 2020-09-26 DIAGNOSIS — I509 Heart failure, unspecified: Secondary | ICD-10-CM | POA: Diagnosis not present

## 2020-09-26 DIAGNOSIS — R5383 Other fatigue: Secondary | ICD-10-CM | POA: Diagnosis not present

## 2020-09-26 DIAGNOSIS — I10 Essential (primary) hypertension: Secondary | ICD-10-CM | POA: Diagnosis not present

## 2020-09-26 DIAGNOSIS — Z299 Encounter for prophylactic measures, unspecified: Secondary | ICD-10-CM | POA: Diagnosis not present

## 2020-09-27 DIAGNOSIS — R5383 Other fatigue: Secondary | ICD-10-CM | POA: Diagnosis not present

## 2020-09-27 DIAGNOSIS — E7800 Pure hypercholesterolemia, unspecified: Secondary | ICD-10-CM | POA: Diagnosis not present

## 2020-09-27 DIAGNOSIS — Z79899 Other long term (current) drug therapy: Secondary | ICD-10-CM | POA: Diagnosis not present

## 2020-09-27 DIAGNOSIS — E559 Vitamin D deficiency, unspecified: Secondary | ICD-10-CM | POA: Diagnosis not present

## 2020-10-04 DIAGNOSIS — I10 Essential (primary) hypertension: Secondary | ICD-10-CM | POA: Diagnosis not present

## 2020-10-09 DIAGNOSIS — L570 Actinic keratosis: Secondary | ICD-10-CM | POA: Diagnosis not present

## 2020-10-09 DIAGNOSIS — Z85828 Personal history of other malignant neoplasm of skin: Secondary | ICD-10-CM | POA: Diagnosis not present

## 2020-10-09 DIAGNOSIS — C44319 Basal cell carcinoma of skin of other parts of face: Secondary | ICD-10-CM | POA: Diagnosis not present

## 2020-10-09 DIAGNOSIS — L821 Other seborrheic keratosis: Secondary | ICD-10-CM | POA: Diagnosis not present

## 2020-10-09 DIAGNOSIS — L820 Inflamed seborrheic keratosis: Secondary | ICD-10-CM | POA: Diagnosis not present

## 2020-10-12 DIAGNOSIS — R7303 Prediabetes: Secondary | ICD-10-CM | POA: Diagnosis not present

## 2020-10-12 DIAGNOSIS — I4821 Permanent atrial fibrillation: Secondary | ICD-10-CM | POA: Diagnosis not present

## 2020-10-12 DIAGNOSIS — E785 Hyperlipidemia, unspecified: Secondary | ICD-10-CM | POA: Diagnosis not present

## 2020-10-12 DIAGNOSIS — N189 Chronic kidney disease, unspecified: Secondary | ICD-10-CM | POA: Diagnosis not present

## 2020-10-12 DIAGNOSIS — Z8679 Personal history of other diseases of the circulatory system: Secondary | ICD-10-CM | POA: Diagnosis not present

## 2020-10-12 DIAGNOSIS — I2510 Atherosclerotic heart disease of native coronary artery without angina pectoris: Secondary | ICD-10-CM | POA: Diagnosis not present

## 2020-10-12 DIAGNOSIS — I5032 Chronic diastolic (congestive) heart failure: Secondary | ICD-10-CM | POA: Diagnosis not present

## 2020-10-12 DIAGNOSIS — I130 Hypertensive heart and chronic kidney disease with heart failure and stage 1 through stage 4 chronic kidney disease, or unspecified chronic kidney disease: Secondary | ICD-10-CM | POA: Diagnosis not present

## 2020-10-18 DIAGNOSIS — H0288A Meibomian gland dysfunction right eye, upper and lower eyelids: Secondary | ICD-10-CM | POA: Diagnosis not present

## 2020-10-18 DIAGNOSIS — D492 Neoplasm of unspecified behavior of bone, soft tissue, and skin: Secondary | ICD-10-CM | POA: Diagnosis not present

## 2020-10-18 DIAGNOSIS — H16223 Keratoconjunctivitis sicca, not specified as Sjogren's, bilateral: Secondary | ICD-10-CM | POA: Diagnosis not present

## 2020-10-18 DIAGNOSIS — H0288B Meibomian gland dysfunction left eye, upper and lower eyelids: Secondary | ICD-10-CM | POA: Diagnosis not present

## 2020-11-01 DIAGNOSIS — B351 Tinea unguium: Secondary | ICD-10-CM | POA: Diagnosis not present

## 2020-11-01 DIAGNOSIS — M79676 Pain in unspecified toe(s): Secondary | ICD-10-CM | POA: Diagnosis not present

## 2020-11-02 DIAGNOSIS — I10 Essential (primary) hypertension: Secondary | ICD-10-CM | POA: Diagnosis not present

## 2020-11-04 DIAGNOSIS — N183 Chronic kidney disease, stage 3 unspecified: Secondary | ICD-10-CM | POA: Diagnosis not present

## 2020-11-04 DIAGNOSIS — I10 Essential (primary) hypertension: Secondary | ICD-10-CM | POA: Diagnosis not present

## 2020-11-12 ENCOUNTER — Ambulatory Visit (INDEPENDENT_AMBULATORY_CARE_PROVIDER_SITE_OTHER): Payer: Medicare Other

## 2020-11-12 DIAGNOSIS — I495 Sick sinus syndrome: Secondary | ICD-10-CM

## 2020-11-13 LAB — CUP PACEART REMOTE DEVICE CHECK
Date Time Interrogation Session: 20220808113612
Implantable Lead Implant Date: 20150413
Implantable Lead Location: 753860
Implantable Lead Serial Number: 29601447
Implantable Pulse Generator Implant Date: 20150413
Pulse Gen Serial Number: 68133668

## 2020-12-05 DIAGNOSIS — I10 Essential (primary) hypertension: Secondary | ICD-10-CM | POA: Diagnosis not present

## 2020-12-05 NOTE — Progress Notes (Signed)
Remote pacemaker transmission.   

## 2020-12-17 DIAGNOSIS — H0019 Chalazion unspecified eye, unspecified eyelid: Secondary | ICD-10-CM | POA: Diagnosis not present

## 2021-01-04 DIAGNOSIS — E7800 Pure hypercholesterolemia, unspecified: Secondary | ICD-10-CM | POA: Diagnosis not present

## 2021-01-04 DIAGNOSIS — I10 Essential (primary) hypertension: Secondary | ICD-10-CM | POA: Diagnosis not present

## 2021-01-08 DIAGNOSIS — L570 Actinic keratosis: Secondary | ICD-10-CM | POA: Diagnosis not present

## 2021-01-08 DIAGNOSIS — L821 Other seborrheic keratosis: Secondary | ICD-10-CM | POA: Diagnosis not present

## 2021-01-08 DIAGNOSIS — Z85828 Personal history of other malignant neoplasm of skin: Secondary | ICD-10-CM | POA: Diagnosis not present

## 2021-01-08 DIAGNOSIS — L720 Epidermal cyst: Secondary | ICD-10-CM | POA: Diagnosis not present

## 2021-01-14 DIAGNOSIS — I110 Hypertensive heart disease with heart failure: Secondary | ICD-10-CM | POA: Diagnosis not present

## 2021-01-14 DIAGNOSIS — I2510 Atherosclerotic heart disease of native coronary artery without angina pectoris: Secondary | ICD-10-CM | POA: Diagnosis not present

## 2021-01-14 DIAGNOSIS — I5032 Chronic diastolic (congestive) heart failure: Secondary | ICD-10-CM | POA: Diagnosis not present

## 2021-02-06 DIAGNOSIS — E1136 Type 2 diabetes mellitus with diabetic cataract: Secondary | ICD-10-CM | POA: Diagnosis not present

## 2021-02-06 DIAGNOSIS — I4891 Unspecified atrial fibrillation: Secondary | ICD-10-CM | POA: Diagnosis not present

## 2021-02-06 DIAGNOSIS — Z6823 Body mass index (BMI) 23.0-23.9, adult: Secondary | ICD-10-CM | POA: Diagnosis not present

## 2021-02-06 DIAGNOSIS — R109 Unspecified abdominal pain: Secondary | ICD-10-CM | POA: Diagnosis not present

## 2021-02-06 DIAGNOSIS — Z299 Encounter for prophylactic measures, unspecified: Secondary | ICD-10-CM | POA: Diagnosis not present

## 2021-02-06 DIAGNOSIS — I509 Heart failure, unspecified: Secondary | ICD-10-CM | POA: Diagnosis not present

## 2021-02-11 ENCOUNTER — Ambulatory Visit (INDEPENDENT_AMBULATORY_CARE_PROVIDER_SITE_OTHER): Payer: Medicare Other

## 2021-02-11 DIAGNOSIS — I495 Sick sinus syndrome: Secondary | ICD-10-CM | POA: Diagnosis not present

## 2021-02-12 LAB — CUP PACEART REMOTE DEVICE CHECK
Date Time Interrogation Session: 20221108142152
Implantable Lead Implant Date: 20150413
Implantable Lead Location: 753860
Implantable Lead Serial Number: 29601447
Implantable Pulse Generator Implant Date: 20150413
Pulse Gen Serial Number: 68133668

## 2021-02-18 NOTE — Progress Notes (Signed)
Remote pacemaker transmission.   

## 2021-03-06 DIAGNOSIS — I10 Essential (primary) hypertension: Secondary | ICD-10-CM | POA: Diagnosis not present

## 2021-03-06 DIAGNOSIS — E7800 Pure hypercholesterolemia, unspecified: Secondary | ICD-10-CM | POA: Diagnosis not present

## 2021-03-18 DIAGNOSIS — I714 Abdominal aortic aneurysm, without rupture, unspecified: Secondary | ICD-10-CM | POA: Diagnosis not present

## 2021-03-18 DIAGNOSIS — Z95828 Presence of other vascular implants and grafts: Secondary | ICD-10-CM | POA: Diagnosis not present

## 2021-03-18 DIAGNOSIS — I723 Aneurysm of iliac artery: Secondary | ICD-10-CM | POA: Diagnosis not present

## 2021-03-18 DIAGNOSIS — L7634 Postprocedural seroma of skin and subcutaneous tissue following other procedure: Secondary | ICD-10-CM | POA: Diagnosis not present

## 2021-03-21 DIAGNOSIS — Z95828 Presence of other vascular implants and grafts: Secondary | ICD-10-CM | POA: Diagnosis not present

## 2021-04-10 DIAGNOSIS — R21 Rash and other nonspecific skin eruption: Secondary | ICD-10-CM | POA: Diagnosis not present

## 2021-04-10 DIAGNOSIS — I10 Essential (primary) hypertension: Secondary | ICD-10-CM | POA: Diagnosis not present

## 2021-04-10 DIAGNOSIS — Z789 Other specified health status: Secondary | ICD-10-CM | POA: Diagnosis not present

## 2021-04-10 DIAGNOSIS — Z6823 Body mass index (BMI) 23.0-23.9, adult: Secondary | ICD-10-CM | POA: Diagnosis not present

## 2021-04-10 DIAGNOSIS — I700 Atherosclerosis of aorta: Secondary | ICD-10-CM | POA: Diagnosis not present

## 2021-04-10 DIAGNOSIS — Z299 Encounter for prophylactic measures, unspecified: Secondary | ICD-10-CM | POA: Diagnosis not present

## 2021-04-10 DIAGNOSIS — R5383 Other fatigue: Secondary | ICD-10-CM | POA: Diagnosis not present

## 2021-04-17 DIAGNOSIS — L821 Other seborrheic keratosis: Secondary | ICD-10-CM | POA: Diagnosis not present

## 2021-04-17 DIAGNOSIS — L2089 Other atopic dermatitis: Secondary | ICD-10-CM | POA: Diagnosis not present

## 2021-04-17 DIAGNOSIS — L570 Actinic keratosis: Secondary | ICD-10-CM | POA: Diagnosis not present

## 2021-04-17 DIAGNOSIS — S60562A Insect bite (nonvenomous) of left hand, initial encounter: Secondary | ICD-10-CM | POA: Diagnosis not present

## 2021-04-17 DIAGNOSIS — L308 Other specified dermatitis: Secondary | ICD-10-CM | POA: Diagnosis not present

## 2021-04-17 DIAGNOSIS — Z85828 Personal history of other malignant neoplasm of skin: Secondary | ICD-10-CM | POA: Diagnosis not present

## 2021-04-26 ENCOUNTER — Telehealth: Payer: Self-pay | Admitting: Internal Medicine

## 2021-04-26 NOTE — Telephone Encounter (Signed)
Spoke with patient.  She reports her monitor was unplugged until this morning.  Advised we have not received communication since 1/9.    Will look on 1/23 to see if a transmission came through if not we will notify pt. To troubleshoot monitor.  Pt states she is going on vacation leaving tomorrow she will be returning 1/26.  If monitor not working properly,s he requests we call her on 1/27.

## 2021-04-26 NOTE — Telephone Encounter (Signed)
°  1. Has your device fired? NO  2. Is you device beeping? NO  3. Are you experiencing draining or swelling at device site? NO  4. Are you calling to see if we received your device transmission? YES  5. Have you passed out? NO   Patient states that she had work done on her house Monday. They had to unplug her device unit.  Patient says that the light is yellow.  Is this normal?   Please route to [place]

## 2021-04-29 NOTE — Telephone Encounter (Signed)
Monitor updated on 04/27/2021, periodic EGM recorded no anomalies.

## 2021-05-03 ENCOUNTER — Emergency Department (HOSPITAL_COMMUNITY)
Admission: EM | Admit: 2021-05-03 | Discharge: 2021-05-04 | Disposition: A | Discharge status: Home / Self Care | Payer: Medicare Other | Source: Home / Self Care | Attending: Student | Admitting: Student

## 2021-05-03 ENCOUNTER — Emergency Department (HOSPITAL_COMMUNITY): Payer: Medicare Other

## 2021-05-03 ENCOUNTER — Other Ambulatory Visit: Payer: Self-pay

## 2021-05-03 ENCOUNTER — Encounter (HOSPITAL_COMMUNITY): Payer: Self-pay

## 2021-05-03 DIAGNOSIS — I10 Essential (primary) hypertension: Secondary | ICD-10-CM | POA: Insufficient documentation

## 2021-05-03 DIAGNOSIS — I480 Paroxysmal atrial fibrillation: Secondary | ICD-10-CM | POA: Insufficient documentation

## 2021-05-03 DIAGNOSIS — Z7901 Long term (current) use of anticoagulants: Secondary | ICD-10-CM | POA: Insufficient documentation

## 2021-05-03 DIAGNOSIS — M1990 Unspecified osteoarthritis, unspecified site: Secondary | ICD-10-CM | POA: Diagnosis not present

## 2021-05-03 DIAGNOSIS — Z8673 Personal history of transient ischemic attack (TIA), and cerebral infarction without residual deficits: Secondary | ICD-10-CM | POA: Insufficient documentation

## 2021-05-03 DIAGNOSIS — Z66 Do not resuscitate: Secondary | ICD-10-CM | POA: Diagnosis not present

## 2021-05-03 DIAGNOSIS — R1031 Right lower quadrant pain: Secondary | ICD-10-CM | POA: Insufficient documentation

## 2021-05-03 DIAGNOSIS — R1032 Left lower quadrant pain: Secondary | ICD-10-CM | POA: Insufficient documentation

## 2021-05-03 DIAGNOSIS — R109 Unspecified abdominal pain: Secondary | ICD-10-CM | POA: Diagnosis not present

## 2021-05-03 DIAGNOSIS — Z9049 Acquired absence of other specified parts of digestive tract: Secondary | ICD-10-CM | POA: Diagnosis not present

## 2021-05-03 DIAGNOSIS — U071 COVID-19: Secondary | ICD-10-CM

## 2021-05-03 DIAGNOSIS — E1140 Type 2 diabetes mellitus with diabetic neuropathy, unspecified: Secondary | ICD-10-CM | POA: Insufficient documentation

## 2021-05-03 DIAGNOSIS — I2510 Atherosclerotic heart disease of native coronary artery without angina pectoris: Secondary | ICD-10-CM | POA: Insufficient documentation

## 2021-05-03 DIAGNOSIS — E1151 Type 2 diabetes mellitus with diabetic peripheral angiopathy without gangrene: Secondary | ICD-10-CM | POA: Diagnosis not present

## 2021-05-03 DIAGNOSIS — Z79899 Other long term (current) drug therapy: Secondary | ICD-10-CM | POA: Insufficient documentation

## 2021-05-03 DIAGNOSIS — E876 Hypokalemia: Secondary | ICD-10-CM | POA: Diagnosis not present

## 2021-05-03 DIAGNOSIS — I723 Aneurysm of iliac artery: Secondary | ICD-10-CM | POA: Diagnosis not present

## 2021-05-03 DIAGNOSIS — Z743 Need for continuous supervision: Secondary | ICD-10-CM | POA: Diagnosis not present

## 2021-05-03 DIAGNOSIS — R0902 Hypoxemia: Secondary | ICD-10-CM | POA: Diagnosis not present

## 2021-05-03 DIAGNOSIS — I5042 Chronic combined systolic (congestive) and diastolic (congestive) heart failure: Secondary | ICD-10-CM | POA: Diagnosis not present

## 2021-05-03 DIAGNOSIS — K5900 Constipation, unspecified: Secondary | ICD-10-CM | POA: Diagnosis not present

## 2021-05-03 DIAGNOSIS — R6889 Other general symptoms and signs: Secondary | ICD-10-CM | POA: Diagnosis not present

## 2021-05-03 DIAGNOSIS — E222 Syndrome of inappropriate secretion of antidiuretic hormone: Secondary | ICD-10-CM | POA: Diagnosis not present

## 2021-05-03 DIAGNOSIS — I495 Sick sinus syndrome: Secondary | ICD-10-CM | POA: Diagnosis not present

## 2021-05-03 DIAGNOSIS — I714 Abdominal aortic aneurysm, without rupture, unspecified: Secondary | ICD-10-CM | POA: Diagnosis not present

## 2021-05-03 DIAGNOSIS — K219 Gastro-esophageal reflux disease without esophagitis: Secondary | ICD-10-CM | POA: Insufficient documentation

## 2021-05-03 DIAGNOSIS — R531 Weakness: Secondary | ICD-10-CM | POA: Diagnosis not present

## 2021-05-03 DIAGNOSIS — R5381 Other malaise: Secondary | ICD-10-CM | POA: Diagnosis not present

## 2021-05-03 DIAGNOSIS — I150 Renovascular hypertension: Secondary | ICD-10-CM | POA: Diagnosis not present

## 2021-05-03 DIAGNOSIS — E1142 Type 2 diabetes mellitus with diabetic polyneuropathy: Secondary | ICD-10-CM | POA: Diagnosis not present

## 2021-05-03 DIAGNOSIS — E785 Hyperlipidemia, unspecified: Secondary | ICD-10-CM | POA: Diagnosis not present

## 2021-05-03 DIAGNOSIS — I160 Hypertensive urgency: Secondary | ICD-10-CM | POA: Diagnosis not present

## 2021-05-03 DIAGNOSIS — J9811 Atelectasis: Secondary | ICD-10-CM | POA: Diagnosis not present

## 2021-05-03 DIAGNOSIS — M19021 Primary osteoarthritis, right elbow: Secondary | ICD-10-CM | POA: Diagnosis not present

## 2021-05-03 DIAGNOSIS — I517 Cardiomegaly: Secondary | ICD-10-CM | POA: Diagnosis not present

## 2021-05-03 DIAGNOSIS — K5730 Diverticulosis of large intestine without perforation or abscess without bleeding: Secondary | ICD-10-CM | POA: Diagnosis not present

## 2021-05-03 DIAGNOSIS — L89321 Pressure ulcer of left buttock, stage 1: Secondary | ICD-10-CM | POA: Diagnosis not present

## 2021-05-03 DIAGNOSIS — I7143 Infrarenal abdominal aortic aneurysm, without rupture: Secondary | ICD-10-CM | POA: Diagnosis not present

## 2021-05-03 DIAGNOSIS — Z951 Presence of aortocoronary bypass graft: Secondary | ICD-10-CM | POA: Insufficient documentation

## 2021-05-03 DIAGNOSIS — J1282 Pneumonia due to coronavirus disease 2019: Secondary | ICD-10-CM | POA: Diagnosis not present

## 2021-05-03 DIAGNOSIS — R112 Nausea with vomiting, unspecified: Secondary | ICD-10-CM | POA: Diagnosis not present

## 2021-05-03 DIAGNOSIS — J9601 Acute respiratory failure with hypoxia: Secondary | ICD-10-CM | POA: Diagnosis not present

## 2021-05-03 DIAGNOSIS — H9190 Unspecified hearing loss, unspecified ear: Secondary | ICD-10-CM | POA: Diagnosis not present

## 2021-05-03 DIAGNOSIS — M810 Age-related osteoporosis without current pathological fracture: Secondary | ICD-10-CM | POA: Diagnosis not present

## 2021-05-03 LAB — CBC WITH DIFFERENTIAL/PLATELET
Abs Immature Granulocytes: 0.05 10*3/uL (ref 0.00–0.07)
Basophils Absolute: 0 10*3/uL (ref 0.0–0.1)
Basophils Relative: 1 %
Eosinophils Absolute: 0.1 10*3/uL (ref 0.0–0.5)
Eosinophils Relative: 1 %
HCT: 36.7 % (ref 36.0–46.0)
Hemoglobin: 11.8 g/dL — ABNORMAL LOW (ref 12.0–15.0)
Immature Granulocytes: 1 %
Lymphocytes Relative: 5 %
Lymphs Abs: 0.4 10*3/uL — ABNORMAL LOW (ref 0.7–4.0)
MCH: 29.5 pg (ref 26.0–34.0)
MCHC: 32.2 g/dL (ref 30.0–36.0)
MCV: 91.8 fL (ref 80.0–100.0)
Monocytes Absolute: 1.2 10*3/uL — ABNORMAL HIGH (ref 0.1–1.0)
Monocytes Relative: 15 %
Neutro Abs: 6.3 10*3/uL (ref 1.7–7.7)
Neutrophils Relative %: 77 %
Platelets: 223 10*3/uL (ref 150–400)
RBC: 4 MIL/uL (ref 3.87–5.11)
RDW: 13.7 % (ref 11.5–15.5)
WBC: 8 10*3/uL (ref 4.0–10.5)
nRBC: 0 % (ref 0.0–0.2)

## 2021-05-03 LAB — URINALYSIS, MICROSCOPIC (REFLEX)

## 2021-05-03 LAB — COMPREHENSIVE METABOLIC PANEL
ALT: 13 U/L (ref 0–44)
AST: 18 U/L (ref 15–41)
Albumin: 3.6 g/dL (ref 3.5–5.0)
Alkaline Phosphatase: 75 U/L (ref 38–126)
Anion gap: 5 (ref 5–15)
BUN: 18 mg/dL (ref 8–23)
CO2: 27 mmol/L (ref 22–32)
Calcium: 8.9 mg/dL (ref 8.9–10.3)
Chloride: 100 mmol/L (ref 98–111)
Creatinine, Ser: 0.92 mg/dL (ref 0.44–1.00)
GFR, Estimated: 57 mL/min — ABNORMAL LOW (ref >60)
Glucose, Bld: 113 mg/dL — ABNORMAL HIGH (ref 70–99)
Potassium: 3.4 mmol/L — ABNORMAL LOW (ref 3.5–5.1)
Sodium: 132 mmol/L — ABNORMAL LOW (ref 135–145)
Total Bilirubin: 0.3 mg/dL (ref 0.3–1.2)
Total Protein: 6.7 g/dL (ref 6.5–8.1)

## 2021-05-03 LAB — URINALYSIS, ROUTINE W REFLEX MICROSCOPIC
Bilirubin Urine: NEGATIVE
Glucose, UA: NEGATIVE mg/dL
Hgb urine dipstick: NEGATIVE
Ketones, ur: NEGATIVE mg/dL
Leukocytes,Ua: NEGATIVE
Nitrite: NEGATIVE
Protein, ur: 30 mg/dL — AB
Specific Gravity, Urine: 1.02 (ref 1.005–1.030)
pH: 7 (ref 5.0–8.0)

## 2021-05-03 LAB — RESP PANEL BY RT-PCR (FLU A&B, COVID) ARPGX2
Influenza A by PCR: NEGATIVE
Influenza B by PCR: NEGATIVE
SARS Coronavirus 2 by RT PCR: POSITIVE — AB

## 2021-05-03 LAB — LIPASE, BLOOD: Lipase: 47 U/L (ref 11–51)

## 2021-05-03 MED ORDER — ONDANSETRON HCL 4 MG/2ML IJ SOLN
4.0000 mg | Freq: Once | INTRAMUSCULAR | Status: AC
  Administered 2021-05-03: 4 mg via INTRAVENOUS
  Filled 2021-05-03: qty 2

## 2021-05-03 MED ORDER — MOLNUPIRAVIR EUA 200MG CAPSULE: 4.0000 | ORAL_CAPSULE | Freq: Two times a day (BID) | ORAL | 0 refills | Status: AC

## 2021-05-03 MED ORDER — IOHEXOL 300 MG/ML  SOLN
75.0000 mL | Freq: Once | INTRAMUSCULAR | Status: AC | PRN
  Administered 2021-05-03: 75 mL via INTRAVENOUS

## 2021-05-03 MED ORDER — IOHEXOL 350 MG/ML SOLN
100.0000 mL | Freq: Once | INTRAVENOUS | Status: AC | PRN
  Administered 2021-05-03: 100 mL via INTRAVENOUS

## 2021-05-03 MED ORDER — MORPHINE SULFATE (PF) 4 MG/ML IV SOLN
3.0000 mg | Freq: Once | INTRAVENOUS | Status: AC
  Administered 2021-05-03: 3 mg via INTRAVENOUS
  Filled 2021-05-03: qty 1

## 2021-05-03 NOTE — ED Triage Notes (Signed)
Pt brought to ED via [facility] for generalized weakness and abdominal pain. Pt has been exposed to Covid.
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
## 2021-05-03 NOTE — ED Notes (Signed)
IV removed d/t infiltration of contrast. Swelling noted at site. Ice pack applied.

## 2021-05-03 NOTE — ED Notes (Signed)
Patient transported to CT 

## 2021-05-04 NOTE — ED Notes (Signed)
Pt given d/c instructions, verbalized understanding. Pt son called by EDP to give update. Escorted home by family.

## 2021-05-05 ENCOUNTER — Emergency Department (HOSPITAL_COMMUNITY): Payer: Medicare Other

## 2021-05-05 ENCOUNTER — Inpatient Hospital Stay (HOSPITAL_COMMUNITY): Payer: Medicare Other

## 2021-05-05 ENCOUNTER — Encounter (HOSPITAL_COMMUNITY): Payer: Self-pay

## 2021-05-05 ENCOUNTER — Inpatient Hospital Stay (HOSPITAL_COMMUNITY)
Admission: EM | Admit: 2021-05-05 | Discharge: 2021-05-08 | DRG: 177 | Disposition: A | Discharge status: Home Health | Payer: Medicare Other | Attending: Family Medicine | Admitting: Family Medicine

## 2021-05-05 ENCOUNTER — Other Ambulatory Visit: Payer: Self-pay

## 2021-05-05 DIAGNOSIS — J9601 Acute respiratory failure with hypoxia: Secondary | ICD-10-CM | POA: Diagnosis present

## 2021-05-05 DIAGNOSIS — E222 Syndrome of inappropriate secretion of antidiuretic hormone: Secondary | ICD-10-CM | POA: Diagnosis not present

## 2021-05-05 DIAGNOSIS — R6889 Other general symptoms and signs: Secondary | ICD-10-CM | POA: Diagnosis not present

## 2021-05-05 DIAGNOSIS — Z743 Need for continuous supervision: Secondary | ICD-10-CM | POA: Diagnosis not present

## 2021-05-05 DIAGNOSIS — I2510 Atherosclerotic heart disease of native coronary artery without angina pectoris: Secondary | ICD-10-CM | POA: Diagnosis present

## 2021-05-05 DIAGNOSIS — I495 Sick sinus syndrome: Secondary | ICD-10-CM | POA: Diagnosis not present

## 2021-05-05 DIAGNOSIS — R112 Nausea with vomiting, unspecified: Secondary | ICD-10-CM | POA: Diagnosis present

## 2021-05-05 DIAGNOSIS — R0902 Hypoxemia: Secondary | ICD-10-CM

## 2021-05-05 DIAGNOSIS — K5900 Constipation, unspecified: Secondary | ICD-10-CM | POA: Diagnosis not present

## 2021-05-05 DIAGNOSIS — J9811 Atelectasis: Secondary | ICD-10-CM | POA: Diagnosis not present

## 2021-05-05 DIAGNOSIS — Z66 Do not resuscitate: Secondary | ICD-10-CM | POA: Diagnosis present

## 2021-05-05 DIAGNOSIS — I517 Cardiomegaly: Secondary | ICD-10-CM | POA: Diagnosis not present

## 2021-05-05 DIAGNOSIS — Z888 Allergy status to other drugs, medicaments and biological substances status: Secondary | ICD-10-CM

## 2021-05-05 DIAGNOSIS — I160 Hypertensive urgency: Secondary | ICD-10-CM | POA: Diagnosis present

## 2021-05-05 DIAGNOSIS — I5042 Chronic combined systolic (congestive) and diastolic (congestive) heart failure: Secondary | ICD-10-CM | POA: Diagnosis not present

## 2021-05-05 DIAGNOSIS — Z981 Arthrodesis status: Secondary | ICD-10-CM

## 2021-05-05 DIAGNOSIS — H9190 Unspecified hearing loss, unspecified ear: Secondary | ICD-10-CM | POA: Diagnosis not present

## 2021-05-05 DIAGNOSIS — I714 Abdominal aortic aneurysm, without rupture, unspecified: Secondary | ICD-10-CM | POA: Diagnosis not present

## 2021-05-05 DIAGNOSIS — M1990 Unspecified osteoarthritis, unspecified site: Secondary | ICD-10-CM | POA: Diagnosis not present

## 2021-05-05 DIAGNOSIS — U071 COVID-19: Secondary | ICD-10-CM | POA: Diagnosis not present

## 2021-05-05 DIAGNOSIS — I480 Paroxysmal atrial fibrillation: Secondary | ICD-10-CM | POA: Diagnosis present

## 2021-05-05 DIAGNOSIS — Z8542 Personal history of malignant neoplasm of other parts of uterus: Secondary | ICD-10-CM

## 2021-05-05 DIAGNOSIS — Z79899 Other long term (current) drug therapy: Secondary | ICD-10-CM

## 2021-05-05 DIAGNOSIS — Z90710 Acquired absence of both cervix and uterus: Secondary | ICD-10-CM

## 2021-05-05 DIAGNOSIS — J9600 Acute respiratory failure, unspecified whether with hypoxia or hypercapnia: Secondary | ICD-10-CM | POA: Diagnosis not present

## 2021-05-05 DIAGNOSIS — Z955 Presence of coronary angioplasty implant and graft: Secondary | ICD-10-CM

## 2021-05-05 DIAGNOSIS — I150 Renovascular hypertension: Secondary | ICD-10-CM | POA: Diagnosis present

## 2021-05-05 DIAGNOSIS — I6523 Occlusion and stenosis of bilateral carotid arteries: Secondary | ICD-10-CM | POA: Diagnosis not present

## 2021-05-05 DIAGNOSIS — L8990 Pressure ulcer of unspecified site, unspecified stage: Secondary | ICD-10-CM | POA: Insufficient documentation

## 2021-05-05 DIAGNOSIS — E7800 Pure hypercholesterolemia, unspecified: Secondary | ICD-10-CM | POA: Diagnosis not present

## 2021-05-05 DIAGNOSIS — Z833 Family history of diabetes mellitus: Secondary | ICD-10-CM

## 2021-05-05 DIAGNOSIS — K219 Gastro-esophageal reflux disease without esophagitis: Secondary | ICD-10-CM | POA: Diagnosis not present

## 2021-05-05 DIAGNOSIS — I4821 Permanent atrial fibrillation: Secondary | ICD-10-CM | POA: Diagnosis not present

## 2021-05-05 DIAGNOSIS — I10 Essential (primary) hypertension: Secondary | ICD-10-CM | POA: Diagnosis not present

## 2021-05-05 DIAGNOSIS — Z823 Family history of stroke: Secondary | ICD-10-CM

## 2021-05-05 DIAGNOSIS — R059 Cough, unspecified: Secondary | ICD-10-CM | POA: Diagnosis not present

## 2021-05-05 DIAGNOSIS — E785 Hyperlipidemia, unspecified: Secondary | ICD-10-CM | POA: Diagnosis present

## 2021-05-05 DIAGNOSIS — L89321 Pressure ulcer of left buttock, stage 1: Secondary | ICD-10-CM | POA: Diagnosis not present

## 2021-05-05 DIAGNOSIS — Z8673 Personal history of transient ischemic attack (TIA), and cerebral infarction without residual deficits: Secondary | ICD-10-CM

## 2021-05-05 DIAGNOSIS — Z950 Presence of cardiac pacemaker: Secondary | ICD-10-CM | POA: Diagnosis present

## 2021-05-05 DIAGNOSIS — E1151 Type 2 diabetes mellitus with diabetic peripheral angiopathy without gangrene: Secondary | ICD-10-CM | POA: Diagnosis present

## 2021-05-05 DIAGNOSIS — J1282 Pneumonia due to coronavirus disease 2019: Secondary | ICD-10-CM | POA: Diagnosis not present

## 2021-05-05 DIAGNOSIS — M810 Age-related osteoporosis without current pathological fracture: Secondary | ICD-10-CM | POA: Diagnosis present

## 2021-05-05 DIAGNOSIS — E876 Hypokalemia: Secondary | ICD-10-CM | POA: Diagnosis not present

## 2021-05-05 DIAGNOSIS — Z7901 Long term (current) use of anticoagulants: Secondary | ICD-10-CM

## 2021-05-05 DIAGNOSIS — E1142 Type 2 diabetes mellitus with diabetic polyneuropathy: Secondary | ICD-10-CM | POA: Diagnosis present

## 2021-05-05 DIAGNOSIS — R1310 Dysphagia, unspecified: Secondary | ICD-10-CM | POA: Diagnosis not present

## 2021-05-05 DIAGNOSIS — R0603 Acute respiratory distress: Secondary | ICD-10-CM | POA: Diagnosis not present

## 2021-05-05 DIAGNOSIS — Z8249 Family history of ischemic heart disease and other diseases of the circulatory system: Secondary | ICD-10-CM

## 2021-05-05 DIAGNOSIS — Z96641 Presence of right artificial hip joint: Secondary | ICD-10-CM | POA: Diagnosis present

## 2021-05-05 DIAGNOSIS — M47812 Spondylosis without myelopathy or radiculopathy, cervical region: Secondary | ICD-10-CM | POA: Diagnosis not present

## 2021-05-05 DIAGNOSIS — K5730 Diverticulosis of large intestine without perforation or abscess without bleeding: Secondary | ICD-10-CM | POA: Diagnosis not present

## 2021-05-05 DIAGNOSIS — K862 Cyst of pancreas: Secondary | ICD-10-CM | POA: Diagnosis not present

## 2021-05-05 DIAGNOSIS — I4891 Unspecified atrial fibrillation: Secondary | ICD-10-CM | POA: Diagnosis present

## 2021-05-05 DIAGNOSIS — F39 Unspecified mood [affective] disorder: Secondary | ICD-10-CM | POA: Diagnosis present

## 2021-05-05 LAB — CBC WITH DIFFERENTIAL/PLATELET
Abs Immature Granulocytes: 0.04 10*3/uL (ref 0.00–0.07)
Basophils Absolute: 0 10*3/uL (ref 0.0–0.1)
Basophils Relative: 0 %
Eosinophils Absolute: 0 10*3/uL (ref 0.0–0.5)
Eosinophils Relative: 0 %
HCT: 37.8 % (ref 36.0–46.0)
Hemoglobin: 12.3 g/dL (ref 12.0–15.0)
Immature Granulocytes: 1 %
Lymphocytes Relative: 10 %
Lymphs Abs: 0.7 10*3/uL (ref 0.7–4.0)
MCH: 29.6 pg (ref 26.0–34.0)
MCHC: 32.5 g/dL (ref 30.0–36.0)
MCV: 90.9 fL (ref 80.0–100.0)
Monocytes Absolute: 1.3 10*3/uL — ABNORMAL HIGH (ref 0.1–1.0)
Monocytes Relative: 17 %
Neutro Abs: 5.7 10*3/uL (ref 1.7–7.7)
Neutrophils Relative %: 72 %
Platelets: 204 10*3/uL (ref 150–400)
RBC: 4.16 MIL/uL (ref 3.87–5.11)
RDW: 13.8 % (ref 11.5–15.5)
WBC: 7.8 10*3/uL (ref 4.0–10.5)
nRBC: 0 % (ref 0.0–0.2)

## 2021-05-05 LAB — HEPATIC FUNCTION PANEL
ALT: 23 U/L (ref 0–44)
AST: 36 U/L (ref 15–41)
Albumin: 3.7 g/dL (ref 3.5–5.0)
Alkaline Phosphatase: 67 U/L (ref 38–126)
Bilirubin, Direct: 0.1 mg/dL (ref 0.0–0.2)
Indirect Bilirubin: 0.3 mg/dL (ref 0.3–0.9)
Total Bilirubin: 0.4 mg/dL (ref 0.3–1.2)
Total Protein: 7.1 g/dL (ref 6.5–8.1)

## 2021-05-05 LAB — BASIC METABOLIC PANEL
Anion gap: 10 (ref 5–15)
BUN: 17 mg/dL (ref 8–23)
CO2: 26 mmol/L (ref 22–32)
Calcium: 8.6 mg/dL — ABNORMAL LOW (ref 8.9–10.3)
Chloride: 93 mmol/L — ABNORMAL LOW (ref 98–111)
Creatinine, Ser: 0.81 mg/dL (ref 0.44–1.00)
GFR, Estimated: >60 mL/min (ref >60)
Glucose, Bld: 148 mg/dL — ABNORMAL HIGH (ref 70–99)
Potassium: 3.4 mmol/L — ABNORMAL LOW (ref 3.5–5.1)
Sodium: 129 mmol/L — ABNORMAL LOW (ref 135–145)

## 2021-05-05 LAB — TROPONIN I (HIGH SENSITIVITY)
Troponin I (High Sensitivity): 42 ng/L — ABNORMAL HIGH (ref <18)
Troponin I (High Sensitivity): 81 ng/L — ABNORMAL HIGH (ref <18)

## 2021-05-05 LAB — BRAIN NATRIURETIC PEPTIDE: B Natriuretic Peptide: 1059 pg/mL — ABNORMAL HIGH (ref 0.0–100.0)

## 2021-05-05 LAB — LIPASE, BLOOD: Lipase: 41 U/L (ref 11–51)

## 2021-05-05 LAB — PROCALCITONIN: Procalcitonin: 0.24 ng/mL

## 2021-05-05 LAB — D-DIMER, QUANTITATIVE: D-Dimer, Quant: 2.35 ug/mL-FEU — ABNORMAL HIGH (ref 0.00–0.50)

## 2021-05-05 MED ORDER — IRBESARTAN 300 MG PO TABS
150.0000 mg | ORAL_TABLET | Freq: Two times a day (BID) | ORAL | Status: DC
  Administered 2021-05-05 – 2021-05-06 (×2): 150 mg via ORAL
  Filled 2021-05-05 (×2): qty 1

## 2021-05-05 MED ORDER — DILTIAZEM HCL ER COATED BEADS 120 MG PO CP24
120.0000 mg | ORAL_CAPSULE | Freq: Every day | ORAL | Status: DC
  Administered 2021-05-05 – 2021-05-06 (×2): 120 mg via ORAL
  Filled 2021-05-05 (×2): qty 1

## 2021-05-05 MED ORDER — ACETAMINOPHEN 325 MG PO TABS: 650.0000 mg | ORAL_TABLET | Freq: Four times a day (QID) | ORAL | Status: DC | PRN

## 2021-05-05 MED ORDER — MIRTAZAPINE 15 MG PO TBDP
15.0000 mg | ORAL_TABLET | Freq: Every day | ORAL | Status: DC
  Administered 2021-05-05: 15 mg via ORAL
  Filled 2021-05-05 (×4): qty 1

## 2021-05-05 MED ORDER — METOPROLOL TARTRATE 50 MG PO TABS
50.0000 mg | ORAL_TABLET | Freq: Once | ORAL | Status: AC
  Administered 2021-05-05: 50 mg via ORAL
  Filled 2021-05-05: qty 1

## 2021-05-05 MED ORDER — FUROSEMIDE 10 MG/ML IJ SOLN
10.0000 mg | Freq: Once | INTRAMUSCULAR | Status: AC
  Administered 2021-05-05: 10 mg via INTRAVENOUS
  Filled 2021-05-05: qty 2

## 2021-05-05 MED ORDER — PREDNISONE 50 MG PO TABS: 50.0000 mg | ORAL_TABLET | Freq: Every day | ORAL | Status: DC

## 2021-05-05 MED ORDER — MOLNUPIRAVIR EUA 200MG CAPSULE
4.0000 | ORAL_CAPSULE | Freq: Two times a day (BID) | ORAL | Status: DC
  Administered 2021-05-05: 200 mg via ORAL
  Administered 2021-05-06 – 2021-05-08 (×5): 800 mg via ORAL
  Filled 2021-05-05 (×2): qty 4

## 2021-05-05 MED ORDER — FUROSEMIDE 10 MG/ML IJ SOLN: 20.0000 mg | Freq: Once | INTRAMUSCULAR | Status: DC

## 2021-05-05 MED ORDER — DILTIAZEM HCL ER COATED BEADS 120 MG PO CP24: 120.0000 mg | ORAL_CAPSULE | Freq: Every day | ORAL | Status: DC

## 2021-05-05 MED ORDER — ASCORBIC ACID 500 MG PO TABS
500.0000 mg | ORAL_TABLET | Freq: Every day | ORAL | Status: DC
  Administered 2021-05-05 – 2021-05-08 (×4): 500 mg via ORAL
  Filled 2021-05-05 (×4): qty 1

## 2021-05-05 MED ORDER — ACETAMINOPHEN 650 MG RE SUPP: 650.0000 mg | Freq: Four times a day (QID) | RECTAL | Status: DC | PRN

## 2021-05-05 MED ORDER — TOCILIZUMAB 400 MG/20ML IV SOLN
400.0000 mg | Freq: Once | INTRAVENOUS | Status: AC
  Administered 2021-05-06: 400 mg via INTRAVENOUS
  Filled 2021-05-05 (×2): qty 20

## 2021-05-05 MED ORDER — POTASSIUM CHLORIDE 10 MEQ/100ML IV SOLN
10.0000 meq | INTRAVENOUS | Status: AC
  Administered 2021-05-05 – 2021-05-06 (×2): 10 meq via INTRAVENOUS
  Filled 2021-05-05 (×2): qty 100

## 2021-05-05 MED ORDER — SODIUM CHLORIDE 0.9 % IV SOLN
2.0000 g | INTRAVENOUS | Status: DC
  Administered 2021-05-05 – 2021-05-07 (×3): 2 g via INTRAVENOUS
  Filled 2021-05-05 (×3): qty 20

## 2021-05-05 MED ORDER — CITALOPRAM HYDROBROMIDE 10 MG PO TABS
5.0000 mg | ORAL_TABLET | Freq: Every day | ORAL | Status: DC
Start: 2021-05-06 — End: 2021-05-08
  Administered 2021-05-06 – 2021-05-08 (×3): 5 mg via ORAL
  Filled 2021-05-05 (×3): qty 1

## 2021-05-05 MED ORDER — MELATONIN 3 MG PO TABS
3.0000 mg | ORAL_TABLET | Freq: Every day | ORAL | Status: DC
  Administered 2021-05-05: 3 mg via ORAL
  Filled 2021-05-05 (×2): qty 1

## 2021-05-05 MED ORDER — HYDRALAZINE HCL 20 MG/ML IJ SOLN: 10.0000 mg | INTRAMUSCULAR | Status: DC | PRN

## 2021-05-05 MED ORDER — ROSUVASTATIN CALCIUM 5 MG PO TABS
5.0000 mg | ORAL_TABLET | Freq: Every day | ORAL | Status: DC
  Administered 2021-05-05 – 2021-05-08 (×4): 5 mg via ORAL
  Filled 2021-05-05 (×4): qty 1

## 2021-05-05 MED ORDER — ONDANSETRON HCL 4 MG/2ML IJ SOLN
4.0000 mg | Freq: Once | INTRAMUSCULAR | Status: AC
  Administered 2021-05-05: 4 mg via INTRAVENOUS
  Filled 2021-05-05: qty 2

## 2021-05-05 MED ORDER — ZINC GLUCONATE 50 MG PO TABS
50.0000 mg | ORAL_TABLET | Freq: Every day | ORAL | Status: DC
  Filled 2021-05-05 (×2): qty 1

## 2021-05-05 MED ORDER — ZINC SULFATE 220 (50 ZN) MG PO CAPS
220.0000 mg | ORAL_CAPSULE | Freq: Every day | ORAL | Status: DC
  Administered 2021-05-05 – 2021-05-08 (×4): 220 mg via ORAL
  Filled 2021-05-05 (×4): qty 1

## 2021-05-05 MED ORDER — CITALOPRAM HYDROBROMIDE 20 MG PO TABS
20.0000 mg | ORAL_TABLET | Freq: Every day | ORAL | Status: DC
  Filled 2021-05-05: qty 1

## 2021-05-05 MED ORDER — APIXABAN 2.5 MG PO TABS
2.5000 mg | ORAL_TABLET | Freq: Two times a day (BID) | ORAL | Status: DC
  Administered 2021-05-05 – 2021-05-08 (×6): 2.5 mg via ORAL
  Filled 2021-05-05 (×6): qty 1

## 2021-05-05 MED ORDER — CLONIDINE HCL 0.1 MG PO TABS
0.1000 mg | ORAL_TABLET | Freq: Once | ORAL | Status: AC
  Administered 2021-05-05: 0.1 mg via ORAL
  Filled 2021-05-05: qty 1

## 2021-05-05 MED ORDER — SODIUM CHLORIDE 0.9 % IV BOLUS
1000.0000 mL | Freq: Once | INTRAVENOUS | Status: AC
  Administered 2021-05-05: 1000 mL via INTRAVENOUS

## 2021-05-05 MED ORDER — VITAMIN D 25 MCG (1000 UNIT) PO TABS
25.0000 ug | ORAL_TABLET | Freq: Every day | ORAL | Status: DC
  Administered 2021-05-05 – 2021-05-08 (×4): 25 ug via ORAL
  Filled 2021-05-05 (×4): qty 1

## 2021-05-05 MED ORDER — LABETALOL HCL 5 MG/ML IV SOLN: 5.0000 mg | INTRAVENOUS | Status: DC | PRN

## 2021-05-05 MED ORDER — CLONIDINE HCL 0.1 MG PO TABS
0.1000 mg | ORAL_TABLET | Freq: Three times a day (TID) | ORAL | Status: DC
  Administered 2021-05-05 – 2021-05-06 (×5): 0.1 mg via ORAL
  Filled 2021-05-05 (×5): qty 1

## 2021-05-05 MED ORDER — PANTOPRAZOLE SODIUM 40 MG PO TBEC
40.0000 mg | DELAYED_RELEASE_TABLET | Freq: Every day | ORAL | Status: DC
  Administered 2021-05-05 – 2021-05-08 (×4): 40 mg via ORAL
  Filled 2021-05-05 (×4): qty 1

## 2021-05-05 MED ORDER — METOPROLOL SUCCINATE ER 25 MG PO TB24
25.0000 mg | ORAL_TABLET | Freq: Two times a day (BID) | ORAL | Status: DC
  Administered 2021-05-05 – 2021-05-08 (×5): 25 mg via ORAL
  Filled 2021-05-05 (×5): qty 1

## 2021-05-05 MED ORDER — IOHEXOL 350 MG/ML SOLN
80.0000 mL | Freq: Once | INTRAVENOUS | Status: AC | PRN
  Administered 2021-05-05: 80 mL via INTRAVENOUS

## 2021-05-05 MED ORDER — SODIUM CHLORIDE 0.9 % IV SOLN
500.0000 mg | INTRAVENOUS | Status: DC
  Administered 2021-05-05 – 2021-05-07 (×3): 500 mg via INTRAVENOUS
  Filled 2021-05-05 (×4): qty 5

## 2021-05-05 MED ORDER — METHYLPREDNISOLONE SODIUM SUCC 40 MG IJ SOLR
0.5000 mg/kg | Freq: Two times a day (BID) | INTRAMUSCULAR | Status: AC
  Administered 2021-05-05 – 2021-05-08 (×6): 27.2 mg via INTRAVENOUS
  Filled 2021-05-05 (×6): qty 1

## 2021-05-05 NOTE — ED Notes (Signed)
Pt son to the nurses station, shouting that his mother has vomited and had BM. This nurse and another RN to bedside, cleaned pt and changed linens.  Have asked son to stay in the room multiple times due to pt testing covid positive, and to use the call bell for assistance.  Pt comfortable and resting at this time. Call bell in reach. Son still at bedside.

## 2021-05-05 NOTE — ED Notes (Signed)
Pt toileted X2 to [facility].  Pt back in bed at this time. No additional needs at this time

## 2021-05-05 NOTE — H&P (Addendum)
History and Physical    Karina [name] UVO:[id number] DOB: 1923/04/05 DOA: 05/05/2021  PCP: [name], MD  Patient coming from: Home.  Chief Complaint: Low oxygen level.  HPI: Karina [name] is a 88 y.o. female with history of CAD status post stenting, atrial fibrillation, tachybradycardia syndrome, complete heart block status post pacemaker, hypertension, abdominal aortic aneurysm status post endovascular repair presents to the ER after patient was found to be hypoxic at home.  Patient had come to the ER 2 days ago after patient felt weak and started having abdominal discomfort while patient was trying to move her bowel.  At that time in the ER patient had CT abdomen pelvis which showed abdominal aneurysm and iliac aneurysm has increased in size but showed no endovascular leak and ER physician discussed with vascular surgeon at [facility] advised follow-up as outpatient.  Patient's abdominal pain at the time improved with morphine.  Patient also was found to be COVID-positive was discharged on Molpunavir.  Patient also has been having sore throat for the last 3 days.  Since discharge patient has become more fatigued and also has been having it is 2-3 episodes of vomiting.  Denies any chest pain or abdominal pain.  Her son today checked on her and found her to be hypoxic and was brought to the ER.  Patient states over the last 2 to 3 days patient has been having persistent productive cough.  Patient states she did start taking her Molpunavir.  Last week patient had traveled to [place].  Had just returned back on May 02, 2021 about 4 days ago.  ED Course: In the ER patient was hypoxic initially requiring 2 L oxygen but soon became more hypoxic requiring 6 L oxygen.  In the ER patient had 2 episodes of nausea vomiting and also had 1 large bowel movement.  Chest x-ray does not show any definite infiltrates.  Patient is also having elevated blood pressure with systolic in  the 742V.  Patient labs show sodium of 129 potassium of 3.4 BNP of thousand.  CBC unremarkable.  LFTs and lipase are normal.  EKG shows normal sinus rhythm.  Intraventricular conduction delay.  Patient admitted for further work-up of acute respiratory failure hypoxia with COVID infection and hypertensive urgency.  Review of Systems: As per HPI, rest all negative.   Past Medical History:  Diagnosis Date   Actinic keratosis    Bilateral leg weakness 09/27/12   CAD (coronary artery disease)    EF 55% by 2 D Echo 2008 and 2012   Chronic atrial fibrillation (HCC)    Chronic edema 10/15/11   Chronic fatigue    DDD (degenerative disc disease)    Encounter for long-term (current) use of other medications 09/02/12   Endometrial carcinoma ([place])    Fibromuscular dysplasia ([place])    S/P PCI right renal aretery 1999   GERD (gastroesophageal reflux disease)    Hip fracture, right ([place]) 08/06/06   S/P surgery   Hx of cardiovascular stress test 09/2010   Nuclear stress testing showing no evidence of ischemia, EF=76%, No EKG changes & no perfusion defects.   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   LVH (left ventricular hypertrophy)    Mild   Osteoarthritis    Osteoporosis    With Hx of thoracic compression  fractures   Pacemaker    Paroxysmal atrial fibrillation (HCC)    Peripheral neuropathy    Peripheral vascular disease ([place])    Pre-diabetes 01/21/11   Chronic   Renovascular hypertension    Tachy-brady syndrome (HCC)    Thoracic compression fracture (HCC)    TIA (transient ischemic attack) 08/2011   OSH: [place], left LE numbness, CT negative   [place] 1994   S/P successful surgery   Trigeminal neuralgia of right side of face     Past Surgical History:  Procedure Laterality Date   ABDOMINAL AORTAGRAM  09/05/01   Right & left renals  25%   APPENDECTOMY  1946   CARDIAC CATHETERIZATION  11/05/01   EF 72%. RCA 25%. LCX 50%. Ramus 75/100%. LAD 95%. D2 75%.   [place]   CORONARY ANGIOPLASTY     CORONARY ANGIOPLASTY WITH STENT PLACEMENT  11/05/01   PTCA/Stent to 95% mid LAD, Ramus lesion could not be crossed and was not treated.    DIPYRIDAMOLE STRESS TEST  09/2010   Nuclear stress testing showing no evidence of ischemia, EF=76%, No EKG changes & no perfusion defects.   LUMBAR DISC SURGERY     ORIF FEMUR FRACTURE Right 02/16/2017   Procedure: OPEN REDUCTION INTERNAL FIXATION (ORIF) DISTAL FEMUR FRACTURE;  Surgeon: Shona Needles, MD;  Location: [facility];  Service: Orthopedics;  Laterality: Right;   RENAL ANGIOGRAM  09/14/97   25% diffuse left renal artery. 50% ostia; right renal artery.   RENAL ARTERY ANGIOPLASTY Right 1994   Renal artery stenosis secondary to fibromusclar dysplasia   RENAL ARTERY ANGIOPLASTY Right 09/13/97   PTA/Stent to ostial right renal artery, No distal fibromuscular hyperplasia   SPINAL FUSION  2002   Percutaneous spinal fusion   TOTAL ABDOMINAL HYSTERECTOMY W/ BILATERAL SALPINGOOPHORECTOMY  [place]     reports that she has never smoked. She has never used smokeless tobacco. She reports current alcohol use. She reports that she does not use drugs.  Allergies  Allergen Reactions   Ace Inhibitors Hives   Amiodarone Other (See Comments)    Worsening peripheral neuropathy   Tikosyn [Dofetilide] Other (See Comments)    QT prolongation   Meperidine And Related Other (See Comments)    Unknown    Family History  Problem Relation Age of Onset   COPD Brother    Diabetes Brother        DM   Hypertension Brother    Stroke Father    Stroke Other     Prior to Admission medications   Medication Sig Start Date End Date Taking? Authorizing Provider  apixaban (ELIQUIS) 2.5 MG TABS tablet Take 1 tablet (2.5 mg total) 2  (two) times daily by mouth. 02/23/17  Yes Eloise Levels, MD  Cholecalciferol (VITAMIN D3) 25 MCG (1000 UT) CAPS Take 1 capsule by mouth daily.   Yes [provider]  citalopram (CELEXA) 10 MG tablet Take 5 mg by mouth daily. 04/17/21  Yes [provider]  citalopram (CELEXA) 20 MG tablet Take 20 mg by mouth daily.   Yes [provider]  cloNIDine (CATAPRES) 0.1 MG tablet Take 0.1 mg by mouth 3 (three) times daily. 05/07/09  Yes [provider]  diltiazem (TIAZAC) 120 MG 24 hr capsule Take 120 mg by mouth daily. 07/26/19  Yes [provider]  furosemide (LASIX) 20 MG tablet Take 20 mg  by mouth daily as needed. 11/11/17  Yes [provider]  mirtazapine (REMERON SOL-TAB) 15 MG disintegrating tablet Take 15 mg by mouth at bedtime.   Yes [provider]  molnupiravir EUA (LAGEVRIO) 200 mg CAPS capsule Take 4 capsules (800 mg total) by mouth 2 (two) times daily for 5 days. 05/03/21 05/08/21 Yes Kommor, [place], MD  nitroGLYCERIN (NITROSTAT) 0.4 MG SL tablet Place 0.4 mg under the tongue every 5 (five) minutes as needed for chest pain. 04/10/10  Yes [provider]  pantoprazole (PROTONIX) 40 MG tablet Take 40 mg by mouth daily.   Yes [provider]  polyethylene glycol powder (GLYCOLAX/MIRALAX) 17 GM/SCOOP powder Take by mouth. 02/23/17  Yes [provider]  potassium chloride (MICRO-K) 10 MEQ CR capsule Take 10 mEq by mouth daily. 07/14/20  Yes [provider]  rosuvastatin (CRESTOR) 5 MG tablet Take 1 tablet (5 mg total) by mouth daily. 12/21/18  Yes Emokpae, Courage, MD  SF 5000 PLUS 1.1 % CREA dental cream Take by mouth. 11/26/20  Yes [provider]  valsartan (DIOVAN) 160 MG tablet Take 160 mg by mouth 2 (two) times daily.   Yes [provider]  zinc gluconate 50 MG tablet Take 50 mg by mouth daily.   Yes [provider]  ammonium lactate (LAC-HYDRIN) 12 % lotion Apply topically  daily. 08/16/20   [provider]  cycloSPORINE (RESTASIS) 0.05 % ophthalmic emulsion Apply to eye. Patient not taking: Reported on 08/27/2020 11/21/19   [provider]  diltiazem (CARDIZEM CD) 120 MG 24 hr capsule Take 120 mg by mouth daily.  11/15/14   [provider]  metoprolol succinate (TOPROL-XL) 25 MG 24 hr tablet Take 25 mg by mouth 2 (two) times daily. 04/17/21   [provider]  metoprolol succinate (TOPROL-XL) 50 MG 24 hr tablet Take 50 mg by mouth 2 (two) times daily. Take with or immediately following a meal.    [provider]    Physical Exam: Constitutional: Moderately built and nourished. Vitals:   05/05/21 1301 05/05/21 1306 05/05/21 1405 05/05/21 1430  BP:   (!) 172/128 (!) 94/53  Pulse:  73 74 82  Resp:  17 19 16   Temp:      TempSrc:      SpO2:  98% 97% 91%  Weight: 54.4 kg     Height: 5\' 1"  (1.549 m)      Eyes: Anicteric no pallor. ENMT: No discharge from the ears eyes nose and mouth. Neck: No mass felt.  No neck rigidity. Respiratory: No rhonchi or crepitations. Cardiovascular: S1-S2 heard. Abdomen: Soft nontender bowel sound present. Musculoskeletal: No edema. Skin: No rash. Neurologic: Alert awake oriented time place and person.  Moves all extremities. Psychiatric: Appears normal.  Normal affect.   Labs on Admission: I have personally reviewed following labs and imaging studies  CBC: Recent Labs  Lab 05/03/21 1510 05/05/21 1339  WBC 8.0 7.8  NEUTROABS 6.3 5.7  HGB 11.8* 12.3  HCT 36.7 37.8  MCV 91.8 90.9  PLT 223 174   Basic Metabolic Panel: Recent Labs  Lab 05/03/21 1510 05/05/21 1326  NA 132* 129*  K 3.4* 3.4*  CL 100 93*  CO2 27 26  GLUCOSE 113* 148*  BUN 18 17  CREATININE 0.92 0.81  CALCIUM 8.9 8.6*   GFR: Estimated Creatinine Clearance: 30.7 mL/min (by C-G formula based on SCr of 0.81 mg/dL). Liver Function Tests: Recent Labs  Lab 05/03/21 1510 05/05/21 1326  AST 18 36  ALT 13  23  ALKPHOS 75 67  BILITOT 0.3 0.4  PROT 6.7 7.1  ALBUMIN 3.6 3.7   Recent Labs  Lab 05/03/21 1510 05/05/21 1326  LIPASE 47 41   No results for input(s): AMMONIA in the last 168 hours. Coagulation Profile: No results for input(s): INR, PROTIME in the last 168 hours. Cardiac Enzymes: No results for input(s): CKTOTAL, CKMB, CKMBINDEX, TROPONINI in the last 168 hours. BNP (last 3 results) No results for input(s): PROBNP in the last 8760 hours. HbA1C: No results for input(s): HGBA1C in the last 72 hours. CBG: No results for input(s): GLUCAP in the last 168 hours. Lipid Profile: No results for input(s): CHOL, HDL, LDLCALC, TRIG, CHOLHDL, LDLDIRECT in the last 72 hours. Thyroid Function Tests: No results for input(s): TSH, T4TOTAL, FREET4, T3FREE, THYROIDAB in the last 72 hours. Anemia Panel: No results for input(s): VITAMINB12, FOLATE, FERRITIN, TIBC, IRON, RETICCTPCT in the last 72 hours. Urine analysis:    Component Value Date/Time   COLORURINE YELLOW 05/03/2021 1926   APPEARANCEUR CLEAR 05/03/2021 1926   LABSPEC 1.020 05/03/2021 1926   PHURINE 7.0 05/03/2021 1926   GLUCOSEU NEGATIVE 05/03/2021 1926   HGBUR NEGATIVE 05/03/2021 [place] NEGATIVE 05/03/2021 1926   KETONESUR NEGATIVE 05/03/2021 1926   PROTEINUR 30 (A) 05/03/2021 1926   UROBILINOGEN 0.2 01/18/2015 2010   NITRITE NEGATIVE 05/03/2021 1926   LEUKOCYTESUR NEGATIVE 05/03/2021 1926   Sepsis Labs: @LABRCNTIP (procalcitonin:4,lacticidven:4) ) Recent Results (from the past 240 hour(s))  Resp Panel by RT-PCR (Flu A&B, Covid) Nasopharyngeal Swab     Status: Abnormal   Collection Time: 05/03/21  3:03 PM   Specimen: Nasopharyngeal Swab; Nasopharyngeal(NP) swabs in vial transport medium  Result Value Ref Range Status   SARS Coronavirus 2 by RT PCR POSITIVE (A) NEGATIVE Final    Comment: (NOTE) SARS-CoV-2 target nucleic acids are DETECTED.  The SARS-CoV-2 RNA is generally detectable in upper  respiratory specimens during the acute phase of infection. Positive results are indicative of the presence of the identified virus, but do not rule out bacterial infection or co-infection with other pathogens not detected by the test. Clinical correlation with patient history and other diagnostic information is necessary to determine patient infection status. The expected result is Negative.  Fact Sheet for Patients: EntrepreneurPulse.com.au  Fact Sheet for Healthcare Providers: IncredibleEmployment.be  This test is not yet approved or cleared by the [place] FDA and  has been authorized for detection and/or diagnosis of SARS-CoV-2 by FDA under an Emergency Use Authorization (EUA).  This EUA will remain in effect (meaning this test can be used) for the duration of  the COVID-19 declaration under Section 564(b)(1) of the A ct, 21 U.S.C. section 360bbb-3(b)(1), unless the authorization is terminated or revoked sooner.     Influenza A by PCR NEGATIVE NEGATIVE Final   Influenza B by PCR NEGATIVE NEGATIVE Final    Comment: (NOTE) The Xpert Xpress SARS-CoV-2/FLU/RSV plus assay is intended as an aid in the diagnosis of influenza from Nasopharyngeal swab specimens and should not be used as a sole basis for treatment. Nasal washings and aspirates are unacceptable for Xpert Xpress SARS-CoV-2/FLU/RSV testing.  Fact Sheet for Patients: EntrepreneurPulse.com.au  Fact Sheet for Healthcare Providers: IncredibleEmployment.be  This test is not yet approved or cleared by the [place] FDA and has been authorized for detection and/or diagnosis of SARS-CoV-2 by FDA under an Emergency Use Authorization (EUA). This EUA will remain in effect (meaning this test can be used) for the duration of  the COVID-19 declaration under Section 564(b)(1) of the Act, 21 U.S.C. section 360bbb-3(b)(1), unless the authorization is  terminated or revoked.  Performed at [facility], [address], [place], [place] [address]      Radiological Exams on Admission: DG Elbow 2 Views Right  Result Date: 05/03/2021 CLINICAL DATA:  Contrast extravasation. EXAM: RIGHT ELBOW - 2 VIEW COMPARISON:  None. FINDINGS: Antecubital IV is present. There is a large amount of hyperdense contrast throughout deep and superficial soft tissues of the anterior in mid upper arm. No acute fracture or dislocation. The bones are diffusely osteopenic. There is chronic deformity of the radial head. There is degenerative narrowing of the elbow joint. IMPRESSION: 1. Large amount of contrast seen within the superficial and deep soft tissues of the upper arm. 2. No acute fracture or dislocation. Electronically Signed   By: Ronney Asters M.D.   On: 05/03/2021 19:31   CT ABDOMEN PELVIS W CONTRAST  Result Date: 05/03/2021 CLINICAL DATA:  Acute left lower quadrant abdominal pain. EXAM: CT ABDOMEN AND PELVIS WITH CONTRAST TECHNIQUE: Multidetector CT imaging of the abdomen and pelvis was performed using the standard protocol following bolus administration of intravenous contrast. However, no contrast is seen on this exam and this is concerning for extravasation at the IV site. The CT tech and Dr. Tinnie Gens were notified of this possibility. Radiograph of the extremity of the IV is recommended to evaluate for extravasated contrast. RADIATION DOSE REDUCTION: This exam was performed according to the departmental dose-optimization program which includes automated exposure control, adjustment of the mA and/or kV according to patient size and/or use of iterative reconstruction technique. CONTRAST:  37mL OMNIPAQUE IOHEXOL 300 MG/ML  SOLN COMPARISON:  December 13, 2014. FINDINGS: Lower chest: No acute abnormality. Hepatobiliary: No focal liver abnormality is seen. Status post cholecystectomy. No biliary dilatation. Pancreas: Unremarkable. No pancreatic ductal dilatation or  surrounding inflammatory changes. Spleen: Normal in size without focal abnormality. Adrenals/Urinary Tract: Adrenal glands are unremarkable. Kidneys are normal, without renal calculi, focal lesion, or hydronephrosis. Bladder is unremarkable. Stomach/Bowel: The stomach appears normal. There is no evidence of bowel obstruction or inflammation. Status post appendectomy. Sigmoid diverticulosis without inflammation. Vascular/Lymphatic: Status post stent graft repair of infrarenal abdominal aortic aneurysm. Aneurysmal sac measures 6.6 cm in diameter. Due to the lack of intravenous contrast, patency of the graft cannot be evaluated and endoleak cannot be excluded. Reproductive: Status post hysterectomy. No adnexal masses. Other: No abdominal wall hernia or abnormality. No abdominopelvic ascites. Musculoskeletal: Status post right total hip arthroplasty. Status post surgical posterior fusion of L2-3, L3-4 and L4-5. No acute osseous abnormality is noted. IMPRESSION: As noted above, reportedly the patient was given intravenous contrast, but no contrast is seen on this exam. This is concerning for extravasation of contrast and Dr. Matilde Sprang was notified of this possibility. Radiograph of the extremity through which contrast was injected is recommended to evaluate for extravasation. Sigmoid diverticulosis without inflammation. Status post stent graft repair of infrarenal abdominal aortic aneurysm. Due to the lack of intravenous contrast, patency of the stent graft cannot be evaluated. Also, the possibility of endoleak cannot be excluded. Aneurysmal sac measures 6.6 cm in maximum AP diameter. No definite acute abnormality is noted in the abdomen or pelvis. Electronically Signed   By: Marijo Conception M.D.   On: 05/03/2021 19:06   DG Chest Port 1 View  Result Date: 05/05/2021 CLINICAL DATA:  Productive cough. EXAM: PORTABLE CHEST 1 VIEW COMPARISON:  December 20, 2018. FINDINGS: Stable cardiomegaly. Single lead  left-sided  pacemaker is noted. Minimal bibasilar subsegmental atelectasis or scar is noted. Bony thorax is unremarkable. IMPRESSION: Minimal bibasilar subsegmental atelectasis or scarring. Aortic Atherosclerosis (ICD10-I70.0). Electronically Signed   By: Marijo Conception M.D.   On: 05/05/2021 14:22   CT Angio Abd/Pel W and/or Wo Contrast  Result Date: 05/03/2021 CLINICAL DATA:  Lower abdominal pain.  History of AAA repair. EXAM: CTA ABDOMEN AND PELVIS WITHOUT AND WITH CONTRAST TECHNIQUE: Multidetector CT imaging of the abdomen and pelvis was performed using the standard protocol during bolus administration of intravenous contrast. Multiplanar reconstructed images and MIPs were obtained and reviewed to evaluate the vascular anatomy. RADIATION DOSE REDUCTION: This exam was performed according to the departmental dose-optimization program which includes automated exposure control, adjustment of the mA and/or kV according to patient size and/or use of iterative reconstruction technique. CONTRAST:  116mL OMNIPAQUE IOHEXOL 350 MG/ML SOLN COMPARISON:  12/13/2014 CT abdomen pelvis FINDINGS: VASCULAR Aorta: Status post repair of infrarenal abdominal aortic aneurysm via a aortobiiliac stent. The aneurysm sac measures 6.4 x 6.2 cm, increased from 4.3 x 4.0 cm on 12/13/2014. there is no enhancement to suggest endoleak. Celiac: Patent without evidence of aneurysm, dissection, vasculitis or significant stenosis. SMA: Patent without evidence of aneurysm, dissection, vasculitis or significant stenosis. Renals: Patent proximal right renal stent. No high-grade stenosis or fibromuscular dysplasia. IMA: Occluded Inflow: Bilateral common iliac stents. 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 There is extensive in flow atherosclerosis without high-grade stenosis. Proximal Outflow: Bilateral common femoral and visualized portions of the superficial and profunda femoral arteries are patent  without evidence of aneurysm, dissection, vasculitis or significant stenosis. Veins: No obvious venous abnormality within the limitations of this arterial phase study. Review of the MIP images confirms the above findings. NON-VASCULAR Lower chest: No acute abnormality. Hepatobiliary: No focal liver abnormality is seen. Status post cholecystectomy. No biliary dilatation. Pancreas: Unremarkable. No pancreatic ductal dilatation or surrounding inflammatory changes. Spleen: Normal in size without focal abnormality. Adrenals/Urinary Tract: Adrenal glands are unremarkable. Kidneys are normal, without renal calculi, focal lesion, or hydronephrosis. Bladder is unremarkable. Stomach/Bowel: Stomach is within normal limits. Appendix not seen. No evidence of bowel wall thickening, distention, or inflammatory changes. Sigmoid diverticulosis. Lymphatic: No lymphadenopathy Reproductive: Status post hysterectomy Other: No abdominal wall hernia or abnormality. No abdominopelvic ascites. Musculoskeletal: Right hip arthroplasty. L2-5 posterior instrumented fusion. IMPRESSION: 1. Status post repair of infrarenal abdominal aortic aneurysm via an aortobiiliac stent. The aneurysm sac measures 6.4 x 6.2 cm, increased from 4.3 x 4.0 cm on 12/13/2014. No evidence of endoleak. 2. 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 3. No acute abnormality of the abdomen or pelvis. Aortic atherosclerosis (ICD10-I70.0). Electronically Signed   By: Ulyses Jarred M.D.   On: 05/03/2021 21:56    EKG: Independently reviewed.  Normal sinus rhythm.  Assessment/Plan Principal Problem:   Acute respiratory failure due to COVID-19 [facility]) Active Problems:   Atrial fibrillation (HCC)   Cardiac pacemaker in situ   Hypertensive urgency   Tachy-brady syndrome (HCC)   Benign essential HTN    Acute respiratory failure with hypoxemia with COVID infection likely secondary to COVID 19 infection pneumonia.   However patient's BNP and blood pressure is elevated.  Given the worsening hypoxia and recent travel I have ordered a CT angiogram of the chest to rule out PE and also look into other causes.  For now we will keep  patient on steroids since patient is hypoxic from COVID infection and also continue Molpunavir.  Patient and her son does not want patient to be started on remdesivir.  Patient is agreeable to being started on Actemra or baricitinib if patient's respiratory status worsens.  Patient on clinical exam does not look fluid overloaded.  Will await CT findings.  Patient's last 2D echo was showing EF of 50 to 55% on September 2020.  In addition patient will be on vitamin C D and zinc sulfate.  Inflammatory markers CRP and D-dimer are pending.  We will order a small dose of Lasix. Empiric antibiotics for CAP, which maybe discontinued if procalcitonin negative. Hypertensive urgency likely contributing to patient's symptoms.  We will try to resume home medication including clonidine Cardizem metoprolol and ARB.  If patient is not able to take orally due to nausea vomiting may have to start IV antihypertensive infusion.   Nausea vomiting cause not clear.  CT scan done 2 days ago did not show any definite obstruction or any cause for the nausea vomiting.  LFTs and lipase are normal.  Patient had 2 more episodes in the ER of vomiting.  I have ordered a repeat CT abdomen pelvis.  We will closely monitor and further recommendation based on the CT scan findings and clinical course. History of atrial fibrillation and tachybradycardia syndrome takes Eliquis.  If patient's nausea vomiting persist may change to Lovenox.  Patient is also on rate limiting medication Cardizem and metoprolol.  May need to change to IV if patient cannot tolerate orally. CAD status post stenting denies any chest pain.  Will trend cardiac markers given the acute change in respiratory status. History of complete heart block status post pacemaker  placement. Abdominal aortic aneurysm status post endovascular repair recent CT scan done 2 days ago did show increased size when compared to 2016.  ER physician 2 days ago did discuss with patient's vascular surgeon at [facility], since CT scan did not show any endovascular leak and abdominal pain has resolved advised outpatient follow-up.  Since patient is still having nausea vomiting unable to keep him I have ordered a repeat CT scan of the abdomen.  We will have further recommendation based on the repeat CAT scan. Hyponatremia and hypokalemia could be from vomiting.  Patient did receive 1 L fluid bolus in the ER.  Replace potassium.  Follow metabolic panel.  Since patient has acute respiratory failure with hypoxia with hypertensive urgency with COVID infection will need inpatient status.  Addendum -discussed with patient's son that given that patient has elevated D-dimer with patient multifocal pneumonia, hypoxia requiring almost 6 L oxygen with COVID-19 about giving Actemra.  Patient's son agrees.  Early on in the admission patient also had agreed.  Discussed with pharmacy about dosing Actemra.  Since patient also has a sore throat we will get a CT scan of the neck.   DVT prophylaxis: Eliquis.  If patient cannot tolerate orally will change to Lovenox. Code Status: DNR.  Confirmed with patient's son. Family Communication: Patient's son. Disposition Plan: Home. Consults called: None. Admission status: Inpatient.   Rise Patience MD [facility] Pager [phone].  If 7PM-7AM, please contact night-coverage [email or web address] Password [facility]  05/05/2021, 4:54 PM

## 2021-05-05 NOTE — ED Notes (Signed)
Date and time results received: 05/05/21 2126 (use smartphrase ".now" to insert current time)  Test: troponin Critical Value: 76  Name of Provider Notified: Reece Levy, MD  Orders Received? Or Actions Taken?:  n/a

## 2021-05-05 NOTE — ED Notes (Signed)
Pt son at bedside. States pt has had productive cough with yellow mucous. Denies diarrhea, has had N/V, chills, body aches. Unable to keep food down. Has only been able to keep down small sips of water with medications.

## 2021-05-05 NOTE — ED Provider Notes (Signed)
[facility] EMERGENCY DEPARTMENT Provider Note   CSN: [id number] Arrival date & time: 05/05/21  1254     History  Chief Complaint  Patient presents with   Covid Positive    Daylan Boggess is a 88 y.o. female.  Past medical history includes hypertension, hyperlipidemia, atrial fibrillation on anticoagulation, pacemaker, coronary artery disease, diabetes, chronic hyponatremia. Patient was in her normal state of health until Friday morning.  Apparently started feeling generalized weakness around Friday afternoon and started having some generalized abdominal pain.  She was seen here at the emergency department with complaints.  She was found to have COVID-19 and also was worked up for abdominal pain.  She had a CTA scan to assess her AAA which did reveal an expansion of the aneurismal sack as well as a mild expansion of iliac aneurism, but no endo leak was present, however Vascular was consulted and felt that this could be an outpatient follow up.  Her abdominal symptoms have not acutely worsened since then, however she has had decreased appetite and vomiting for about 2 days.  She thinks that the vomiting is more due to her persistent cough and sore throat then being nauseous. After she was discharged from the emergency department, she developed a productive cough with yellow-greenish sputum.  She says she has had a severe sore throat.  She denies any difficulty breathing, however she checked her oxygen saturation this morning and it was down to 82% for about an hour or so.  They called EMS at this time, or her oxygen saturation was still in the 80s.  She was placed on 2 L of oxygen at that time with normal saturation.  patient denies any chest pain.  HPI     Home Medications Prior to Admission medications   Medication Sig Start Date End Date Taking? Authorizing Provider  ammonium lactate (LAC-HYDRIN) 12 % lotion Apply topically daily. 08/16/20   [provider]  apixaban (ELIQUIS)  2.5 MG TABS tablet Take 1 tablet (2.5 mg total) 2 (two) times daily by mouth. 02/23/17   Eloise Levels, MD  Cholecalciferol (VITAMIN D3) 25 MCG (1000 UT) CAPS Take 1 capsule by mouth daily.    [provider]  citalopram (CELEXA) 20 MG tablet Take 20 mg by mouth daily.    [provider]  cloNIDine (CATAPRES) 0.1 MG tablet Take 0.1 mg by mouth 3 (three) times daily. 05/07/09   [provider]  cycloSPORINE (RESTASIS) 0.05 % ophthalmic emulsion Apply to eye. Patient not taking: No sig reported 11/21/19   [provider]  diltiazem (CARDIZEM CD) 120 MG 24 hr capsule Take 120 mg by mouth daily.  11/15/14   [provider]  diltiazem (TIAZAC) 120 MG 24 hr capsule Take 120 mg by mouth daily. 07/26/19   [provider]  furosemide (LASIX) 20 MG tablet Take 20 mg by mouth daily as needed. 11/11/17   [provider]  metoprolol succinate (TOPROL-XL) 50 MG 24 hr tablet Take 50 mg by mouth 2 (two) times daily. Take with or immediately following a meal.    [provider]  mirtazapine (REMERON SOL-TAB) 15 MG disintegrating tablet Take 15 mg by mouth at bedtime.    [provider]  molnupiravir EUA (LAGEVRIO) 200 mg CAPS capsule Take 4 capsules (800 mg total) by mouth 2 (two) times daily for 5 days. 05/03/21 05/08/21  Kommor, [place], MD  nitroGLYCERIN (NITROSTAT) 0.4 MG SL tablet Place 0.4 mg under the tongue every 5 (five) minutes  as needed for chest pain.  Patient not taking: No sig reported 04/10/10   [provider]  pantoprazole (PROTONIX) 40 MG tablet Take 40 mg by mouth daily.    [provider]  polyethylene glycol powder (GLYCOLAX/MIRALAX) 17 GM/SCOOP powder Take by mouth. 02/23/17   [provider]  potassium chloride (MICRO-K) 10 MEQ CR capsule Take 10 mEq by mouth daily. 07/14/20   [provider]  rosuvastatin (CRESTOR) 5 MG tablet Take 1 tablet (5 mg total) by mouth daily. 12/21/18   Roxan Hockey, MD  valsartan (DIOVAN) 160 MG tablet Take 160 mg by mouth 2 (two) times daily.    [provider]  zinc gluconate 50 MG tablet Take 50 mg by mouth daily.    [provider]      Allergies    Ace inhibitors, Amiodarone, Tikosyn [dofetilide], and Meperidine and related    Review of Systems   Review of Systems  Constitutional:  Negative for fever.  HENT:  Positive for sore throat.   Respiratory:  Positive for cough. Negative for shortness of breath.   Cardiovascular:  Negative for chest pain and leg swelling.  Gastrointestinal:  Positive for abdominal pain and vomiting. Negative for constipation, diarrhea and nausea.  Neurological:  Positive for weakness.  All other systems reviewed and are negative.  Physical Exam Updated Vital Signs BP (!) 94/53    Pulse 82    Temp 98.8 F (37.1 C) (Oral)    Resp 16    Ht 5\' 1"  (2.025 m)    Wt 54.4 kg    SpO2 91%    BMI 22.67 kg/m  Physical Exam Vitals and nursing note reviewed.  Constitutional:      General: She is not in acute distress.    Appearance: Normal appearance. She is not ill-appearing, toxic-appearing or diaphoretic.  HENT:     Head: Normocephalic and atraumatic.     Right Ear: Tympanic membrane, ear canal and external ear normal. Decreased hearing noted. Tympanic membrane is not perforated or erythematous.     Left Ear: Tympanic membrane, ear canal and external ear normal. Decreased hearing noted. Tympanic membrane is not perforated or erythematous.     Nose: No nasal deformity.     Mouth/Throat:     Lips: Pink. No lesions.     Mouth: Mucous membranes are moist. No injury, lacerations, oral lesions or angioedema.     Pharynx: Oropharynx is clear. Uvula midline. No pharyngeal swelling, oropharyngeal exudate, posterior oropharyngeal erythema or uvula swelling.     Tonsils: No tonsillar exudate or tonsillar abscesses.     Comments: Posterior pharynx appears clear, No tonsillar swelling or exudates. Uvula  midline. Do not appreciate PTA. No trismus noted. Eyes:     General: Gaze aligned appropriately. No scleral icterus.       Right eye: No discharge.        Left eye: No discharge.     Conjunctiva/sclera: Conjunctivae normal.     Right eye: Right conjunctiva is not injected. No exudate or hemorrhage.    Left eye: Left conjunctiva is not injected. No exudate or hemorrhage. Neck:     Comments: No neck swelling or lymphadenopathy noted Cardiovascular:     Rate and Rhythm: Regular rhythm. Tachycardia present.     Pulses: Normal pulses.          Radial pulses are 2+ on the right side and 2+ on the left side.       Dorsalis pedis pulses are  2+ on the right side and 2+ on the left side.     Heart sounds: Normal heart sounds, S1 normal and S2 normal. Heart sounds not distant. No murmur heard.   No friction rub. No gallop. No S3 or S4 sounds.  Pulmonary:     Effort: Pulmonary effort is normal. No accessory muscle usage or respiratory distress.     Breath sounds: No stridor. Examination of the right-upper field reveals rhonchi. Examination of the left-upper field reveals rhonchi. Examination of the right-middle field reveals rhonchi. Examination of the left-middle field reveals rhonchi. Rhonchi present. No wheezing or rales.     Comments: Course lung fields in upper lobes. Clears with cough.  Chest:     Chest wall: No tenderness.  Abdominal:     General: Abdomen is flat. There is no distension.     Palpations: Abdomen is soft. There is no mass or pulsatile mass.     Tenderness: There is generalized abdominal tenderness. There is no guarding or rebound.     Comments: Very mild generalized abdominal tenderness, no peritoneal signs  Musculoskeletal:     Cervical back: Full passive range of motion without pain and neck supple.     Right lower leg: No edema.     Left lower leg: No edema.  Skin:    General: Skin is warm and dry.     Coloration: Skin is not jaundiced or pale.     Findings: No  bruising, erythema, lesion or rash.  Neurological:     General: No focal deficit present.     Mental Status: She is alert and oriented to person, place, and time.     GCS: GCS eye subscore is 4. GCS verbal subscore is 5. GCS motor subscore is 6.  Psychiatric:        Mood and Affect: Mood normal.        Behavior: Behavior normal. Behavior is cooperative.    ED Results / Procedures / Treatments   Labs (all labs ordered are listed, but only abnormal results are displayed) Labs Reviewed  BASIC METABOLIC PANEL - Abnormal; Notable for the following components:      Result Value   Sodium 129 (*)    Potassium 3.4 (*)    Chloride 93 (*)    Glucose, Bld 148 (*)    Calcium 8.6 (*)    All other components within normal limits  BRAIN NATRIURETIC PEPTIDE - Abnormal; Notable for the following components:   B Natriuretic Peptide 1,059.0 (*)    All other components within normal limits  CBC WITH DIFFERENTIAL/PLATELET - Abnormal; Notable for the following components:   Monocytes Absolute 1.3 (*)    All other components within normal limits  HEPATIC FUNCTION PANEL  LIPASE, BLOOD  URINALYSIS, ROUTINE W REFLEX MICROSCOPIC    EKG EKG Interpretation  Date/Time:  Sunday May 05 2021 13:03:16 EST Ventricular Rate:  87 PR Interval:  242 QRS Duration: 134 QT Interval:  405 QTC Calculation: 488 R Axis:   119 Text Interpretation: Sinus rhythm Prolonged PR interval Nonspecific intraventricular conduction delay Lateral infarct, recent Probable anteroseptal infarct, old Baseline wander in lead(s) V4 Confirmed by Lavenia Atlas [phone]) on 05/05/2021 1:21:30 PM  Radiology DG Elbow 2 Views Right  Result Date: 05/03/2021 CLINICAL DATA:  Contrast extravasation. EXAM: RIGHT ELBOW - 2 VIEW COMPARISON:  None. FINDINGS: Antecubital IV is present. There is a large amount of hyperdense contrast throughout deep and superficial soft tissues of the anterior in mid upper arm. No  acute fracture or dislocation. The  bones are diffusely osteopenic. There is chronic deformity of the radial head. There is degenerative narrowing of the elbow joint. IMPRESSION: 1. Large amount of contrast seen within the superficial and deep soft tissues of the upper arm. 2. No acute fracture or dislocation. Electronically Signed   By: Ronney Asters M.D.   On: 05/03/2021 19:31   CT ABDOMEN PELVIS W CONTRAST  Result Date: 05/03/2021 CLINICAL DATA:  Acute left lower quadrant abdominal pain. EXAM: CT ABDOMEN AND PELVIS WITH CONTRAST TECHNIQUE: Multidetector CT imaging of the abdomen and pelvis was performed using the standard protocol following bolus administration of intravenous contrast. However, no contrast is seen on this exam and this is concerning for extravasation at the IV site. The CT tech and Dr. Tinnie Gens were notified of this possibility. Radiograph of the extremity of the IV is recommended to evaluate for extravasated contrast. RADIATION DOSE REDUCTION: This exam was performed according to the departmental dose-optimization program which includes automated exposure control, adjustment of the mA and/or kV according to patient size and/or use of iterative reconstruction technique. CONTRAST:  65mL OMNIPAQUE IOHEXOL 300 MG/ML  SOLN COMPARISON:  December 13, 2014. FINDINGS: Lower chest: No acute abnormality. Hepatobiliary: No focal liver abnormality is seen. Status post cholecystectomy. No biliary dilatation. Pancreas: Unremarkable. No pancreatic ductal dilatation or surrounding inflammatory changes. Spleen: Normal in size without focal abnormality. Adrenals/Urinary Tract: Adrenal glands are unremarkable. Kidneys are normal, without renal calculi, focal lesion, or hydronephrosis. Bladder is unremarkable. Stomach/Bowel: The stomach appears normal. There is no evidence of bowel obstruction or inflammation. Status post appendectomy. Sigmoid diverticulosis without inflammation. Vascular/Lymphatic: Status post stent graft repair of infrarenal  abdominal aortic aneurysm. Aneurysmal sac measures 6.6 cm in diameter. Due to the lack of intravenous contrast, patency of the graft cannot be evaluated and endoleak cannot be excluded. Reproductive: Status post hysterectomy. No adnexal masses. Other: No abdominal wall hernia or abnormality. No abdominopelvic ascites. Musculoskeletal: Status post right total hip arthroplasty. Status post surgical posterior fusion of L2-3, L3-4 and L4-5. No acute osseous abnormality is noted. IMPRESSION: As noted above, reportedly the patient was given intravenous contrast, but no contrast is seen on this exam. This is concerning for extravasation of contrast and Dr. Matilde Sprang was notified of this possibility. Radiograph of the extremity through which contrast was injected is recommended to evaluate for extravasation. Sigmoid diverticulosis without inflammation. Status post stent graft repair of infrarenal abdominal aortic aneurysm. Due to the lack of intravenous contrast, patency of the stent graft cannot be evaluated. Also, the possibility of endoleak cannot be excluded. Aneurysmal sac measures 6.6 cm in maximum AP diameter. No definite acute abnormality is noted in the abdomen or pelvis. Electronically Signed   By: Marijo Conception M.D.   On: 05/03/2021 19:06   DG Chest Port 1 View  Result Date: 05/05/2021 CLINICAL DATA:  Productive cough. EXAM: PORTABLE CHEST 1 VIEW COMPARISON:  December 20, 2018. FINDINGS: Stable cardiomegaly. Single lead left-sided pacemaker is noted. Minimal bibasilar subsegmental atelectasis or scar is noted. Bony thorax is unremarkable. IMPRESSION: Minimal bibasilar subsegmental atelectasis or scarring. Aortic Atherosclerosis (ICD10-I70.0). Electronically Signed   By: Marijo Conception M.D.   On: 05/05/2021 14:22   CT Angio Abd/Pel W and/or Wo Contrast  Result Date: 05/03/2021 CLINICAL DATA:  Lower abdominal pain.  History of AAA repair. EXAM: CTA ABDOMEN AND PELVIS WITHOUT AND WITH CONTRAST TECHNIQUE:  Multidetector CT imaging of the abdomen and pelvis was performed using the standard protocol during  bolus administration of intravenous contrast. Multiplanar reconstructed images and MIPs were obtained and reviewed to evaluate the vascular anatomy. RADIATION DOSE REDUCTION: This exam was performed according to the departmental dose-optimization program which includes automated exposure control, adjustment of the mA and/or kV according to patient size and/or use of iterative reconstruction technique. CONTRAST:  125mL OMNIPAQUE IOHEXOL 350 MG/ML SOLN COMPARISON:  12/13/2014 CT abdomen pelvis FINDINGS: VASCULAR Aorta: Status post repair of infrarenal abdominal aortic aneurysm via a aortobiiliac stent. The aneurysm sac measures 6.4 x 6.2 cm, increased from 4.3 x 4.0 cm on 12/13/2014. there is no enhancement to suggest endoleak. Celiac: Patent without evidence of aneurysm, dissection, vasculitis or significant stenosis. SMA: Patent without evidence of aneurysm, dissection, vasculitis or significant stenosis. Renals: Patent proximal right renal stent. No high-grade stenosis or fibromuscular dysplasia. IMA: Occluded Inflow: Bilateral common iliac stents. 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 There is extensive in flow atherosclerosis without high-grade stenosis. Proximal Outflow: Bilateral common femoral and visualized portions of the superficial and profunda femoral arteries are patent without evidence of aneurysm, dissection, vasculitis or significant stenosis. Veins: No obvious venous abnormality within the limitations of this arterial phase study. Review of the MIP images confirms the above findings. NON-VASCULAR Lower chest: No acute abnormality. Hepatobiliary: No focal liver abnormality is seen. Status post cholecystectomy. No biliary dilatation. Pancreas: Unremarkable. No pancreatic ductal dilatation or surrounding inflammatory changes. Spleen: Normal in size  without focal abnormality. Adrenals/Urinary Tract: Adrenal glands are unremarkable. Kidneys are normal, without renal calculi, focal lesion, or hydronephrosis. Bladder is unremarkable. Stomach/Bowel: Stomach is within normal limits. Appendix not seen. No evidence of bowel wall thickening, distention, or inflammatory changes. Sigmoid diverticulosis. Lymphatic: No lymphadenopathy Reproductive: Status post hysterectomy Other: No abdominal wall hernia or abnormality. No abdominopelvic ascites. Musculoskeletal: Right hip arthroplasty. L2-5 posterior instrumented fusion. IMPRESSION: 1. Status post repair of infrarenal abdominal aortic aneurysm via an aortobiiliac stent. The aneurysm sac measures 6.4 x 6.2 cm, increased from 4.3 x 4.0 cm on 12/13/2014. No evidence of endoleak. 2. 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 3. No acute abnormality of the abdomen or pelvis. Aortic atherosclerosis (ICD10-I70.0). Electronically Signed   By: Ulyses Jarred M.D.   On: 05/03/2021 21:56    Procedures .Critical Care Performed by: Adolphus Birchwood, PA-C Authorized by: Adolphus Birchwood, PA-C   Critical care provider statement:    Critical care time (minutes):  75   Critical care time was exclusive of:  Separately billable procedures and treating other patients   Critical care was necessary to treat or prevent imminent or life-threatening deterioration of the following conditions:  Respiratory failure   Critical care was time spent personally by me on the following activities:  Blood draw for specimens, development of treatment plan with patient or surrogate, discussions with consultants, discussions with primary provider, evaluation of patient's response to treatment, examination of patient, obtaining history from patient or surrogate, review of old charts, re-evaluation of patient's condition, pulse oximetry, ordering and review of radiographic studies, ordering and  review of laboratory studies and ordering and performing treatments and interventions   Care discussed with: admitting provider   Comments:     Acute respiratory failure 2/2 COVID 19  Cardiac monitoring while in ED  Medications Ordered in ED Medications  diltiazem (CARDIZEM CD) 24 hr capsule 120 mg (120 mg Oral Given 05/05/21 1412)  sodium chloride 0.9 % bolus  1,000 mL (0 mLs Intravenous Stopped 05/05/21 1500)  ondansetron (ZOFRAN) injection 4 mg (4 mg Intravenous Given 05/05/21 1404)  cloNIDine (CATAPRES) tablet 0.1 mg (0.1 mg Oral Given 05/05/21 1406)  metoprolol tartrate (LOPRESSOR) tablet 50 mg (50 mg Oral Given 05/05/21 1406)    ED Course/ Medical Decision Making/ A&P Clinical Course as of 05/05/21 1549  Sun May 05, 2021  1422 Sodium(!): 129 Chronically low [GL]  1423 I discussed this case with my attending physician, Dr. Dina Rich. Plan for admission at this time due to new oxygen requirement and COVID-19 diagnosis [GL]  1542 I discussed case with admitting physician, Hal Hope. Plan to admit to step down given high oxygen needs [GL]    Clinical Course User Index [GL] Keni Elison, Adora Fridge, PA-C                           Medical Decision Making Problems Addressed: COVID: acute illness or injury that poses a threat to life or bodily functions Hypoxia: acute illness or injury that poses a threat to life or bodily functions  Amount and/or Complexity of Data Reviewed Independent Historian:     Details: son is with patient and helps with history. Patient also provides some of the history however this is limited due to severe hard of hearing External Data Reviewed: labs, radiology, ECG and notes.    Details: reviewed prior labs, radiology, EKG, and previous EDP notes from three days ago. Labs: ordered. Decision-making details documented in ED Course. Radiology: ordered and independent interpretation performed. Decision-making details documented in ED Course. ECG/medicine tests: ordered  and independent interpretation performed. Decision-making details documented in ED Course. Discussion of management or test interpretation with external provider(s): Discussed case with hospitalist for admission  Risk Prescription drug management. Decision regarding hospitalization.  Critical Care Total time providing critical care: 75-105 minutes  This is a 88 y.o. female with a PMH of hypertension, hyperlipidemia, atrial fibrillation on anticoagulation, pacemaker, coronary artery disease, diabetes, chronic hyponatremia who presents to the ED with HYPOXIA following a recent COVID 19 diagnoses.  Patient presented with hypoxia that improved with 2 L nasal cannula oxygen.  She is maintaining her saturations on this level.  Her exam is concerning for development of pneumonia v bronchitis with course lung fields and worsening cough. Wells score is low risk and her clinical presentation is not really consistent with PE. Her airway is intact and she has equal breath sounds bilaterally. No wheezing heard on exam, so lower suspicion for COPD/Asthma. She does not appear to be hypervolemic, so lower suspicion of CHF. Plan to obtain Chest XR to evaluate for other emergent causes of acute shortness of breath. Her abdominal exam is stable from the EDP note on Friday. I do not feel that she needs repeat CT imaging at this time. She has had poor PO intake as well so will provide with IVF. She also is hypertensive and tachycardic here with irregular heart rate. Patient has not taken her home Metoprolol, Diltiazem, or Clonidine at home, I will provide these doses here in the ED.   I personally reviewed all laboratory work and imaging. Abnormal results outlined below. CBC is without leukocytosis or other abnormality.  BMP with chronic hyponatremia to 129.  There is mild hypokalemia to 3.4.  Other values appear stable from chronic numbers.  Hepatic function panel was normal.  Lipase negative.  BNP is not elevated at  1059.0, however it is only slightly more elevated than normal.  1536: On reassessment, patient was found to be hypoxic to 86% on 2 L.  She does not appear to be in respiratory distress. Wave form looks intact and fingers are well perfused where pulse ox. Lungs still sounds rhonchourus. She is now on 6 L. CXR did not reveal PNA process, however illness progression is still thought to be viral and less likely to be PE since patient is on chronic AC, not tachycardic, does not fit clinical picture for PE. There could be an element of CHF exasperation given that patient has elevated BNP, however patient does not appear hypervolemic on exam and CXR revealed stable cardiomegaly without pulmonary edema. Will defer to inpatient management on diuresis and management.  Dispo Plan: Admission to step down service  GOC: I discussed code status with patient and family believes that she is a DNR, but does not have paperwork with them. This will need to be confirmed by inpatient team.  I have seen and evaluated this patient in conjunction with my attending physician who agrees and has made changes to the plan accordingly.  Portions of this note were generated with [employer]. Dictation errors may occur despite best attempts at proofreading.  Final Clinical Impression(s) / ED Diagnoses Final diagnoses:  [id number]  Hypoxia    Rx / DC Orders ED Discharge Orders     None         Adolphus Birchwood, PA-C 05/05/21 1549    Horton, Alvin Critchley, DO 05/05/21 1601

## 2021-05-05 NOTE — ED Notes (Signed)
Pt unable to swallow molnupiravir pills due to size. Was able to swallow one, vomited when attempted the second. Refused the last two of dosage after vomiting. MD will be made aware.

## 2021-05-05 NOTE — ED Notes (Signed)
[facility] stated they must have a copy of the yellow DNR form in order to honor code status. MD made aware. None located in the ED at this time, [facility] called and informed, stated they would bring them down to the ED asap. MD will sign once in hand.

## 2021-05-05 NOTE — ED Notes (Signed)
Pt son at bedside. Son is reading a research article "Managing a hospitalized covid patient with pneumonia", states he would like to speak to the MD again, as he does not feel like an emergency physician is informed enough to handle his mothers treatment and care. States he would like his mother to receive a higher level of care than he believes Forestine Na would provide. MD made aware and is at bedside with the patient's son at this time.

## 2021-05-05 NOTE — ED Notes (Signed)
DNR form signed by MD.

## 2021-05-05 NOTE — ED Triage Notes (Signed)
Pt came in via EMS due to low 02 sats. Covid positive 01/28. EMS states sats 83 upon arrival, 2L Delta 02 sats @ 97%.

## 2021-05-06 ENCOUNTER — Inpatient Hospital Stay (HOSPITAL_COMMUNITY): Payer: Medicare Other

## 2021-05-06 DIAGNOSIS — R0603 Acute respiratory distress: Secondary | ICD-10-CM

## 2021-05-06 DIAGNOSIS — L8990 Pressure ulcer of unspecified site, unspecified stage: Secondary | ICD-10-CM | POA: Insufficient documentation

## 2021-05-06 LAB — CBC WITH DIFFERENTIAL/PLATELET
Abs Immature Granulocytes: 0.08 10*3/uL — ABNORMAL HIGH (ref 0.00–0.07)
Basophils Absolute: 0 10*3/uL (ref 0.0–0.1)
Basophils Relative: 0 %
Eosinophils Absolute: 0 10*3/uL (ref 0.0–0.5)
Eosinophils Relative: 0 %
HCT: 36.5 % (ref 36.0–46.0)
Hemoglobin: 12.2 g/dL (ref 12.0–15.0)
Immature Granulocytes: 1 %
Lymphocytes Relative: 6 %
Lymphs Abs: 0.7 10*3/uL (ref 0.7–4.0)
MCH: 30 pg (ref 26.0–34.0)
MCHC: 33.4 g/dL (ref 30.0–36.0)
MCV: 89.9 fL (ref 80.0–100.0)
Monocytes Absolute: 0.6 10*3/uL (ref 0.1–1.0)
Monocytes Relative: 6 %
Neutro Abs: 9.6 10*3/uL — ABNORMAL HIGH (ref 1.7–7.7)
Neutrophils Relative %: 87 %
Platelets: 176 10*3/uL (ref 150–400)
RBC: 4.06 MIL/uL (ref 3.87–5.11)
RDW: 13.8 % (ref 11.5–15.5)
WBC: 11 10*3/uL — ABNORMAL HIGH (ref 4.0–10.5)
nRBC: 0 % (ref 0.0–0.2)

## 2021-05-06 LAB — COMPREHENSIVE METABOLIC PANEL
ALT: 23 U/L (ref 0–44)
AST: 40 U/L (ref 15–41)
Albumin: 2.8 g/dL — ABNORMAL LOW (ref 3.5–5.0)
Alkaline Phosphatase: 56 U/L (ref 38–126)
Anion gap: 9 (ref 5–15)
BUN: 12 mg/dL (ref 8–23)
CO2: 27 mmol/L (ref 22–32)
Calcium: 7.9 mg/dL — ABNORMAL LOW (ref 8.9–10.3)
Chloride: 95 mmol/L — ABNORMAL LOW (ref 98–111)
Creatinine, Ser: 0.86 mg/dL (ref 0.44–1.00)
GFR, Estimated: >60 mL/min (ref >60)
Glucose, Bld: 173 mg/dL — ABNORMAL HIGH (ref 70–99)
Potassium: 3.4 mmol/L — ABNORMAL LOW (ref 3.5–5.1)
Sodium: 131 mmol/L — ABNORMAL LOW (ref 135–145)
Total Bilirubin: 0.4 mg/dL (ref 0.3–1.2)
Total Protein: 5.7 g/dL — ABNORMAL LOW (ref 6.5–8.1)

## 2021-05-06 LAB — URINALYSIS, ROUTINE W REFLEX MICROSCOPIC
Bilirubin Urine: NEGATIVE
Glucose, UA: NEGATIVE mg/dL
Ketones, ur: NEGATIVE mg/dL
Leukocytes,Ua: NEGATIVE
Nitrite: NEGATIVE
Protein, ur: 100 mg/dL — AB
Specific Gravity, Urine: 1.015 (ref 1.005–1.030)
pH: 7 (ref 5.0–8.0)

## 2021-05-06 LAB — URINALYSIS, MICROSCOPIC (REFLEX)

## 2021-05-06 LAB — C-REACTIVE PROTEIN
CRP: 3.2 mg/dL — ABNORMAL HIGH (ref <1.0)
CRP: 6 mg/dL — ABNORMAL HIGH (ref <1.0)

## 2021-05-06 LAB — ECHOCARDIOGRAM COMPLETE
Area-P 1/2: 4.67 cm2
Height: 61.5 in
MV M vel: 4.81 m/s
MV Peak grad: 92.5 mmHg
S' Lateral: 2.5 cm
Weight: 1961.21 oz

## 2021-05-06 LAB — D-DIMER, QUANTITATIVE: D-Dimer, Quant: 1.84 ug/mL-FEU — ABNORMAL HIGH (ref 0.00–0.50)

## 2021-05-06 MED ORDER — DILTIAZEM HCL 60 MG PO TABS
60.0000 mg | ORAL_TABLET | Freq: Four times a day (QID) | ORAL | Status: DC
  Administered 2021-05-06 – 2021-05-08 (×7): 60 mg via ORAL
  Filled 2021-05-06 (×7): qty 1

## 2021-05-06 MED ORDER — IRBESARTAN 75 MG PO TABS
75.0000 mg | ORAL_TABLET | Freq: Every day | ORAL | Status: DC
  Filled 2021-05-06: qty 1

## 2021-05-06 MED ORDER — SODIUM CHLORIDE 0.9 % IV SOLN: INTRAVENOUS | Status: DC | PRN

## 2021-05-06 MED ORDER — PHENOL 1.4 % MT LIQD
1.0000 | OROMUCOSAL | Status: DC | PRN
  Administered 2021-05-07 (×2): 1 via OROMUCOSAL
  Filled 2021-05-06: qty 177

## 2021-05-06 NOTE — Plan of Care (Signed)
°  Problem: Education: Goal: Knowledge of General Education information will improve Description: Including pain rating scale, medication(s)/side effects and non-pharmacologic comfort measures Outcome: Progressing   Problem: Health Behavior/Discharge Planning: Goal: Ability to manage health-related needs will improve Outcome: Progressing   Problem: Clinical Measurements: Goal: Ability to maintain clinical measurements within normal limits will improve Outcome: Progressing   Problem: Clinical Measurements: Goal: Respiratory complications will improve Outcome: Progressing

## 2021-05-06 NOTE — Progress Notes (Signed)
= °  Echocardiogram 2D Echocardiogram has been performed.  Karina [name] M 05/06/2021, 3:03 PM

## 2021-05-06 NOTE — Evaluation (Signed)
Occupational Therapy Evaluation Patient Details Name: Karina [name] MRN: [id number] DOB: 1922-10-20 Today's Date: 05/06/2021   History of Present Illness Patient is a 88 yo female presenting to the ED on 1/29 with low O2 saturations. Patient found to be Covid+ and pneumonia and endorsing nausea and vomiting. Patient previously presenting to ED on 1/27 with abdominal pain, where CT of abdomen pelvis showed abdominal aneurysm and iliac aneurysm had increased in size but showed no endovascular leak and ER physician discussed with vascular surgeon at [facility] advised follow-up as outpatient. PMH includes: CAD status post stenting, atrial fibrillation, tachybradycardia syndrome, complete heart block status post pacemaker, hypertension, abdominal aortic aneurysm status post endovascular repair.   Clinical Impression   Prior to this admission patient was living independently in a multi-story historical home and still driving. Patient had help with basic housework of the house, but was able to complete all ADLs independently. Patient endorses she does not have any family nearby, with her son living in [place] (son is currently in town, OT to follow up with him with regard to discharge planning). Currently, patient is requiring 2L of oxygen, and requires min A for ADLs and functional ambulation.Patient also with decrease in overall activity tolerance and balance when completing ambulation or static standing for ADLs. Patient would benefit from skilled services in the hospital in order to address problem list outlined below. OT recommending HHOT at discharge in order to increase overall activity tolerance and endurance.      Recommendations for follow up therapy are one component of a multi-disciplinary discharge planning process, led by the attending physician.  Recommendations may be updated based on patient status, additional functional criteria and insurance authorization.    Follow Up Recommendations  Home health OT    Assistance Recommended at Discharge Intermittent Supervision/Assistance  Patient can return home with the following A little help with walking and/or transfers;A little help with bathing/dressing/bathroom;Assistance with cooking/housework;Assist for transportation    Functional Status Assessment  Patient has had a recent decline in their functional status and demonstrates the ability to make significant improvements in function in a reasonable and predictable amount of time.  Equipment Recommendations  None recommended by OT (Patient has DME needed)    Recommendations for Other Services       Precautions / Restrictions Precautions Precautions: Fall Precaution Comments: Watch O2, on 2L due to Covid+ Restrictions Weight Bearing Restrictions: No      Mobility Bed Mobility Overal bed mobility: Needs Assistance Bed Mobility: Supine to Sit, Sit to Supine     Supine to sit: Min guard Sit to supine: Min guard   General bed mobility comments: Extra time needed and use of bed rails for stability    Transfers Overall transfer level: Needs assistance Equipment used: Rolling walker (2 wheels) Transfers: Sit to/from Stand Sit to Stand: Min assist           General transfer comment: good hand placement with RW, min A due to unsteadiness      Balance Overall balance assessment: Mild deficits observed, not formally tested                                         ADL either performed or assessed with clinical judgement   ADL Overall ADL's : Needs assistance/impaired Eating/Feeding: Independent   Grooming: Set up   Upper Body Bathing: Set up   Lower  Body Bathing: Minimal assistance   Upper Body Dressing : Set up   Lower Body Dressing: Minimal assistance Lower Body Dressing Details (indicate cue type and reason): Diffculty with R leg (unable to don sock because of difficulty bending knee, usually patient  goes without) Toilet Transfer: Minimal assistance;Ambulation;Rolling walker (2 wheels) Toilet Transfer Details (indicate cue type and reason): Simulated with ambulation in room, minimally unsteady with RW         Functional mobility during ADLs: Minimal assistance;Rolling walker (2 wheels) General ADL Comments: Patient presenting with decreased activity tolerance and endurance in comparison to her baseline     Vision Baseline Vision/History: 0 No visual deficits Patient Visual Report: No change from baseline       Perception     Praxis      Pertinent Vitals/Pain Pain Assessment Pain Assessment: No/denies pain     Hand Dominance     Extremity/Trunk Assessment Upper Extremity Assessment Upper Extremity Assessment: Overall WFL for tasks assessed   Lower Extremity Assessment Lower Extremity Assessment: Defer to PT evaluation   Cervical / Trunk Assessment Cervical / Trunk Assessment: Kyphotic (Minimally)   Communication Communication Communication: HOH   Cognition Arousal/Alertness: Awake/alert Behavior During Therapy: WFL for tasks assessed/performed Overall Cognitive Status: Within Functional Limits for tasks assessed                                       General Comments       Exercises     Shoulder Instructions      Home Living Family/patient expects to be discharged to:: Private residence Living Arrangements: Alone Available Help at Discharge: Family;Available PRN/intermittently Type of Home: House Home Access: Stairs to enter Entrance Stairs-Number of Steps: 1 Entrance Stairs-Rails: Right Home Layout: Multi-level Alternate Level Stairs-Number of Steps:  (Three level historic home, has lift chair that takes her up and down the stairs)   Bathroom Shower/Tub: Occupational psychologist: Standard (Booster on top of toilet) Bathroom Accessibility: Yes   Home Equipment: Conservation officer, nature (2 wheels);Cane - single point;Grab bars -  toilet;Grab bars - tub/shower   Additional Comments: Still drives, has maid that cleans the house, does not like to cook      Prior Functioning/Environment Prior Level of Function : Independent/Modified Independent;Driving               ADLs Comments: Able to complete all ADLs with some extra time        OT Problem List: Decreased strength;Decreased activity tolerance;Impaired balance (sitting and/or standing)      OT Treatment/Interventions: Self-care/ADL training;Therapeutic exercise;Energy conservation;DME and/or AE instruction;Therapeutic activities;Patient/family education    OT Goals(Current goals can be found in the care plan section) Acute Rehab OT Goals Patient Stated Goal: To get back to what I was doing OT Goal Formulation: With patient Time For Goal Achievement: 05/20/21 Potential to Achieve Goals: Good  OT Frequency: Min 2X/week    Co-evaluation              AM-PAC OT "6 Clicks" Daily Activity     Outcome Measure Help from another person eating meals?: None Help from another person taking care of personal grooming?: A Little Help from another person toileting, which includes using toliet, bedpan, or urinal?: A Little Help from another person bathing (including washing, rinsing, drying)?: A Little Help from another person to put on and taking off regular upper body clothing?:  A Little Help from another person to put on and taking off regular lower body clothing?: A Little 6 Click Score: 19   End of Session Equipment Utilized During Treatment: Gait belt;Rolling walker (2 wheels);Oxygen Nurse Communication: Mobility status  Activity Tolerance: Patient tolerated treatment well Patient left: in bed;with call bell/phone within reach;with bed alarm set  OT Visit Diagnosis: Unsteadiness on feet (R26.81);Other abnormalities of gait and mobility (R26.89);Muscle weakness (generalized) (M62.81)                Time: 8372-9021 OT Time Calculation (min): 32  min Charges:  OT General Charges $OT Visit: 1 Visit OT Evaluation $OT Eval Moderate Complexity: 1 Mod OT Treatments $Self Care/Home Management : 8-22 mins  Karina [name], OTR/L Acute Rehabilitation Services [phone] [place] 05/06/2021, 12:13 PM

## 2021-05-06 NOTE — Progress Notes (Signed)
PROGRESS NOTE    Azyiah Bo  WUJ:[id number] DOB: [birth year] DOA: 05/05/2021 PCP: [name], MD    Brief Narrative:  88 year old with history of coronary artery disease status post stenting, paroxysmal A. fib, tachybradycardia syndrome status post pacemaker, hypertension, AAA status post endovascular repair presented to the ER from home with low oxygen.  She was in the ER 2 days ago when she was feeling weak and also having abdominal discomfort, a CT scan abdomen pelvis showed abdominal aortic aneurysm and iliac aneurysm slightly increased in size but not needing any intervention and discharged back home.  She was diagnosed with COVID-19 infection at that time, she was discharged on Molpunavir.  Brought back to the hospital with being more fatigue and vomiting.  In the emergency room, initially hypoxic requiring 2 L oxygen but subsequently required 6 L of oxygen, a CT angiogram without evidence of PE but it showed multifocal pneumonia, she was also given Actemra, admitted with COVID-19 pneumonia.  Blood pressures elevated on presentation.    Assessment & Plan:   Acute hypoxemic respiratory failure due to COVID-19 pneumonia: Continue to monitor due to significant symptoms.  Her oxygen requirement is already improving. chest physiotherapy, incentive spirometry, deep breathing exercises, sputum induction, mucolytic suspected diastolic heart failure and bronchodilators. Supplemental oxygen to keep saturations more than 90%. Covid directed therapy with , steroids, Solu-Medrol and ultimately will transition to oral prednisone. Molpunavir, complete 4 days therapy that was already started on 1/27. actemra, 1 dose given last night with increasing oxygen requirement.  No indication to repeat doses. antibiotics, on Rocephin and azithromycin.  Reasonable to continue due to severity of illness however her symptomatology mostly related to COVID-19 pneumonia. Due to severity of symptoms, patient will  need daily inflammatory markers, chest x-rays, liver function test to monitor and direct COVID-19 therapies. CT angiogram without evidence of pulmonary embolism. CT neck with 1 cm oropharyngeal submucosal lesion, no evidence of dysphagia, odynophagia or abscess.  Hypertensive urgency: Clonidine, Cardizem or metoprolol.  Nausea vomiting and constipation: Improved.  Advance to full liquid diet and soft diet today. Does have a history of AAA repair, CTA on 1/27 and 1/29 is stable without evidence of exacerbation.  Abdominal pain improved.  Chronic A. fib and tachybradycardia syndrome status post pacemaker: Therapeutic on Eliquis.   CAD: Stable.  Hyponatremia and hypokalemia: Stable.  Replace potassium.  Suspected diastolic congestive heart failure: We will check echocardiogram today.   Start mobilizing with PT/OT.  Out of bed to the chair.  Respiratory therapy with incentive.   DVT prophylaxis: apixaban (ELIQUIS) tablet 2.5 mg Start: 05/05/21 2200 apixaban (ELIQUIS) tablet 2.5 mg   Code Status: DNR Family Communication: Patient's son at the bedside Disposition Plan: Status is: Inpatient  Remains inpatient appropriate because: Significant shortness of breath, hypoxemic on active treatment.         Consultants:  None  Procedures:  None  Antimicrobials:  Molpunavir 1/27-- Rocephin and azithromycin 1/29---   Subjective: Patient seen and examined.  Her son was at the bedside.  Patient just arrived from CT scan of the neck. Patient is on 2 L oxygen.  She has some dry cough.  Denies any nausea vomiting or abdominal pain.  She denied any shortness of breath at rest.  She talks about how last 6 months or so she has been gradually getting weaker.  She agrees that this may be her age.  Objective: Vitals:   05/06/21 0015 05/06/21 0427 05/06/21 0842 05/06/21 1059  BP: 140/85 (!) 149/84 Marland Kitchen)  148/95 130/79  Pulse: 83 87 74 81  Resp: (!) 23 (!) 22 15 19   Temp: 97.9 F (36.6 C)  97.8 F (36.6 C)  97.8 F (36.6 C)  TempSrc: Oral Oral  Oral  SpO2: 99% 96% 94% 96%  Weight: 55.6 kg     Height: 5' 1.5" (1.562 m)       Intake/Output Summary (Last 24 hours) at 05/06/2021 1131 Last data filed at 05/06/2021 0607 Gross per 24 hour  Intake 1096.18 ml  Output 600 ml  Net 496.18 ml   Filed Weights   05/05/21 1301 05/06/21 0015  Weight: 54.4 kg 55.6 kg    Examination:  General: Frail looking 88 year old female.  Alert oriented x4.  Age-appropriate looking. Not in any distress.  Hard of hearing.  On 2 L oxygen. Cardiovascular: S1-S2 normal.  Regular rate rhythm.  Pacemaker in place. Respiratory: Some conducted upper airway sounds.  Looks comfortable.  On 1 to 2 L of oxygen. Gastrointestinal: Soft.  Nontender.  Bowel sound present. Ext: No swelling or edema.  No cyanosis. Neuro: Alert oriented x4.  Equal in all extremities.    Data Reviewed: I have personally reviewed following labs and imaging studies  CBC: Recent Labs  Lab 05/03/21 1510 05/05/21 1339 05/06/21 0501  WBC 8.0 7.8 11.0*  NEUTROABS 6.3 5.7 9.6*  HGB 11.8* 12.3 12.2  HCT 36.7 37.8 36.5  MCV 91.8 90.9 89.9  PLT 223 204 465   Basic Metabolic Panel: Recent Labs  Lab 05/03/21 1510 05/05/21 1326 05/06/21 0501  NA 132* 129* 131*  K 3.4* 3.4* 3.4*  CL 100 93* 95*  CO2 27 26 27   GLUCOSE 113* 148* 173*  BUN 18 17 12   CREATININE 0.92 0.81 0.86  CALCIUM 8.9 8.6* 7.9*   GFR: Estimated Creatinine Clearance: 29.6 mL/min (by C-G formula based on SCr of 0.86 mg/dL). Liver Function Tests: Recent Labs  Lab 05/03/21 1510 05/05/21 1326 05/06/21 0501  AST 18 36 40  ALT 13 23 23   ALKPHOS 75 67 56  BILITOT 0.3 0.4 0.4  PROT 6.7 7.1 5.7*  ALBUMIN 3.6 3.7 2.8*   Recent Labs  Lab 05/03/21 1510 05/05/21 1326  LIPASE 47 41   No results for input(s): AMMONIA in the last 168 hours. Coagulation Profile: No results for input(s): INR, PROTIME in the last 168 hours. Cardiac Enzymes: No  results for input(s): CKTOTAL, CKMB, CKMBINDEX, TROPONINI in the last 168 hours. BNP (last 3 results) No results for input(s): PROBNP in the last 8760 hours. HbA1C: No results for input(s): HGBA1C in the last 72 hours. CBG: No results for input(s): GLUCAP in the last 168 hours. Lipid Profile: No results for input(s): CHOL, HDL, LDLCALC, TRIG, CHOLHDL, LDLDIRECT in the last 72 hours. Thyroid Function Tests: No results for input(s): TSH, T4TOTAL, FREET4, T3FREE, THYROIDAB in the last 72 hours. Anemia Panel: No results for input(s): VITAMINB12, FOLATE, FERRITIN, TIBC, IRON, RETICCTPCT in the last 72 hours. Sepsis Labs: Recent Labs  Lab 05/05/21 1956  PROCALCITON 0.24    Recent Results (from the past 240 hour(s))  Resp Panel by RT-PCR (Flu A&B, Covid) Nasopharyngeal Swab     Status: Abnormal   Collection Time: 05/03/21  3:03 PM   Specimen: Nasopharyngeal Swab; Nasopharyngeal(NP) swabs in vial transport medium  Result Value Ref Range Status   SARS Coronavirus 2 by RT PCR POSITIVE (A) NEGATIVE Final    Comment: (NOTE) SARS-CoV-2 target nucleic acids are DETECTED.  The SARS-CoV-2 RNA is generally detectable in upper respiratory  specimens during the acute phase of infection. Positive results are indicative of the presence of the identified virus, but do not rule out bacterial infection or co-infection with other pathogens not detected by the test. Clinical correlation with patient history and other diagnostic information is necessary to determine patient infection status. The expected result is Negative.  Fact Sheet for Patients: EntrepreneurPulse.com.au  Fact Sheet for Healthcare Providers: IncredibleEmployment.be  This test is not yet approved or cleared by the [place] FDA and  has been authorized for detection and/or diagnosis of SARS-CoV-2 by FDA under an Emergency Use Authorization (EUA).  This EUA will remain in effect (meaning this  test can be used) for the duration of  the COVID-19 declaration under Section 564(b)(1) of the A ct, 21 U.S.C. section 360bbb-3(b)(1), unless the authorization is terminated or revoked sooner.     Influenza A by PCR NEGATIVE NEGATIVE Final   Influenza B by PCR NEGATIVE NEGATIVE Final    Comment: (NOTE) The Xpert Xpress SARS-CoV-2/FLU/RSV plus assay is intended as an aid in the diagnosis of influenza from Nasopharyngeal swab specimens and should not be used as a sole basis for treatment. Nasal washings and aspirates are unacceptable for Xpert Xpress SARS-CoV-2/FLU/RSV testing.  Fact Sheet for Patients: EntrepreneurPulse.com.au  Fact Sheet for Healthcare Providers: IncredibleEmployment.be  This test is not yet approved or cleared by the [place] FDA and has been authorized for detection and/or diagnosis of SARS-CoV-2 by FDA under an Emergency Use Authorization (EUA). This EUA will remain in effect (meaning this test can be used) for the duration of the COVID-19 declaration under Section 564(b)(1) of the Act, 21 U.S.C. section 360bbb-3(b)(1), unless the authorization is terminated or revoked.  Performed at [facility], [address] [place], [place] [address]          Radiology Studies: CT SOFT TISSUE NECK WO CONTRAST  Result Date: 05/06/2021 CLINICAL DATA:  Coronavirus infection. Difficulty swallowing. Epiglottitis or tonsillitis suspected. Productive cough. EXAM: CT NECK WITHOUT CONTRAST TECHNIQUE: Multidetector CT imaging of the neck was performed following the standard protocol without intravenous contrast. RADIATION DOSE REDUCTION: This exam was performed according to the departmental dose-optimization program which includes automated exposure control, adjustment of the mA and/or kV according to patient size and/or use of iterative reconstruction technique. COMPARISON:  02/17/2017 FINDINGS: Pharynx and larynx: No evidence of  aggressive appearing mucosal or submucosal mass lesion. Question 1 cm smoothly marginated lesion of the left posterior oropharynx. This could be a mass or cyst. Direct inspection suggested. This would not be expected to result in difficulty swallowing. No inflammatory changes are seen anywhere in the region. Salivary glands: Parotid and submandibular glands are normal. Thyroid: Normal Lymph nodes: No lymphadenopathy on either side of the neck. Vascular: Ordinary atherosclerotic calcification at both carotid bifurcations. Limited intracranial: Normal Visualized orbits: Limited, negative. Mastoids and visualized paranasal sinuses: Clear Skeleton: Chronic nonunited type 2 dens fracture which was acute in 2018. 3 mm of anterolisthesis at the fracture site. Chronic degenerative mid cervical spondylosis and facet arthropathy. Upper chest: Small effusions layering dependently. Aortic atherosclerosis. Hazy alveolar filling that could represent edema or pneumonia. Other: None IMPRESSION: No definite cause of the presenting symptoms is identified. No sign of inflammatory disease or obstructing mass lesion. 1 cm smoothly marginated mucosal or submucosal lesion of the posterior oropharynx on the left. This may be incidental. Direct inspection suggested. Chronic nonunited type 2 fracture of the dens which was acute in 2018. 3 mm of anterolisthesis at this level. Carotid  bifurcation atherosclerotic calcification. Small effusions. Hazy alveolar filling that could be edema or pneumonia. Electronically Signed   By: Nelson Chimes M.D.   On: 05/06/2021 09:46   CT Angio Chest Pulmonary Embolism (PE) W or WO Contrast  Result Date: 05/05/2021 CLINICAL DATA:  Hypoxia, nausea, vomiting. EXAM: CT ANGIOGRAPHY CHEST CT ABDOMEN AND PELVIS WITH CONTRAST TECHNIQUE: Multidetector CT imaging of the chest was performed using the standard protocol during bolus administration of intravenous contrast. Multiplanar CT image reconstructions and MIPs  were obtained to evaluate the vascular anatomy. Multidetector CT imaging of the abdomen and pelvis was performed using the standard protocol during bolus administration of intravenous contrast. RADIATION DOSE REDUCTION: This exam was performed according to the departmental dose-optimization program which includes automated exposure control, adjustment of the mA and/or kV according to patient size and/or use of iterative reconstruction technique. CONTRAST:  53mL OMNIPAQUE IOHEXOL 350 MG/ML SOLN COMPARISON:  May 03, 2021. FINDINGS: CTA CHEST FINDINGS Cardiovascular: Satisfactory opacification of the pulmonary arteries to the segmental level. No evidence of pulmonary embolism. Mild cardiomegaly is noted. Atherosclerosis of thoracic aorta is noted without aneurysm formation. No pericardial effusion. Mediastinum/Nodes: No enlarged mediastinal, hilar, or axillary lymph nodes. Thyroid gland, trachea, and esophagus demonstrate no significant findings. Lungs/Pleura: No pneumothorax is noted. Small right pleural effusion is noted. Patchy airspace opacities are noted in both upper lobes, right greater than left, concerning for multifocal pneumonia. Bilateral lower lobe airspace opacities are also noted concerning for pneumonia. Musculoskeletal: No chest wall abnormality. No acute or significant osseous findings. Review of the MIP images confirms the above findings. CT ABDOMEN and PELVIS FINDINGS Hepatobiliary: No focal liver abnormality is seen. Status post cholecystectomy. No biliary dilatation. Pancreas: Stable 1 cm cyst noted in pancreatic body. No acute inflammation or ductal dilatation is noted. Spleen: Normal in size without focal abnormality. Adrenals/Urinary Tract: Adrenal glands are unremarkable. Kidneys are normal, without renal calculi, focal lesion, or hydronephrosis. Bladder is unremarkable. Stomach/Bowel: Stomach is unremarkable. There is no evidence of bowel obstruction or inflammation. Sigmoid  diverticulosis is noted without inflammation. Vascular/Lymphatic: Status post stent graft repair of infrarenal abdominal aortic aneurysm. The graft and its limbs are widely patent. Excluded aneurysmal sac has maximum measured AP diameter 6.6 cm. 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 Reproductive: Status post hysterectomy. No adnexal masses. Other: No abdominal wall hernia or abnormality. No abdominopelvic ascites. Musculoskeletal: Status post right total hip arthroplasty. Status post surgical posterior fusion extending from L1-L4. No acute osseous abnormality seen. Review of the MIP images confirms the above findings. IMPRESSION: No definite evidence of pulmonary embolus. Bilateral lung opacities are noted concerning for multifocal pneumonia. Small right pleural effusion is noted. Stable 1 cm pancreatic cyst. Most likely a pseudocyst. Indolent neoplasm, such as intraductal papillary mucinous tumor could look similar. Per consensus criteria, follow-up with preferably pre and post contrast abdominal MRI at 1 year is recommended. This recommendation follows ACR consensus guidelines: Managing Incidental Findings on Abdominal CT: White Paper of the ACR Incidental Findings Committee. J Am Coll Radiol 2010;7:754-773. Status post stent graft repair of infrarenal abdominal aortic aneurysm. Graft and its limbs are widely patent. 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 Sigmoid diverticulosis without inflammation. Electronically Signed   By: Marijo Conception M.D.   On: 05/05/2021 17:51   CT ABDOMEN PELVIS W CONTRAST  Result Date: 05/05/2021 CLINICAL DATA:  Hypoxia, nausea, vomiting. EXAM: CT ANGIOGRAPHY CHEST CT ABDOMEN AND PELVIS WITH CONTRAST TECHNIQUE: Multidetector CT imaging of the  chest was performed using the standard protocol during bolus administration of intravenous contrast. Multiplanar CT image reconstructions and MIPs were obtained to evaluate the vascular anatomy. Multidetector CT  imaging of the abdomen and pelvis was performed using the standard protocol during bolus administration of intravenous contrast. RADIATION DOSE REDUCTION: This exam was performed according to the departmental dose-optimization program which includes automated exposure control, adjustment of the mA and/or kV according to patient size and/or use of iterative reconstruction technique. CONTRAST:  10mL OMNIPAQUE IOHEXOL 350 MG/ML SOLN COMPARISON:  May 03, 2021. FINDINGS: CTA CHEST FINDINGS Cardiovascular: Satisfactory opacification of the pulmonary arteries to the segmental level. No evidence of pulmonary embolism. Mild cardiomegaly is noted. Atherosclerosis of thoracic aorta is noted without aneurysm formation. No pericardial effusion. Mediastinum/Nodes: No enlarged mediastinal, hilar, or axillary lymph nodes. Thyroid gland, trachea, and esophagus demonstrate no significant findings. Lungs/Pleura: No pneumothorax is noted. Small right pleural effusion is noted. Patchy airspace opacities are noted in both upper lobes, right greater than left, concerning for multifocal pneumonia. Bilateral lower lobe airspace opacities are also noted concerning for pneumonia. Musculoskeletal: No chest wall abnormality. No acute or significant osseous findings. Review of the MIP images confirms the above findings. CT ABDOMEN and PELVIS FINDINGS Hepatobiliary: No focal liver abnormality is seen. Status post cholecystectomy. No biliary dilatation. Pancreas: Stable 1 cm cyst noted in pancreatic body. No acute inflammation or ductal dilatation is noted. Spleen: Normal in size without focal abnormality. Adrenals/Urinary Tract: Adrenal glands are unremarkable. Kidneys are normal, without renal calculi, focal lesion, or hydronephrosis. Bladder is unremarkable. Stomach/Bowel: Stomach is unremarkable. There is no evidence of bowel obstruction or inflammation. Sigmoid diverticulosis is noted without inflammation. Vascular/Lymphatic: Status post  stent graft repair of infrarenal abdominal aortic aneurysm. The graft and its limbs are widely patent. Excluded aneurysmal sac has maximum measured AP diameter 6.6 cm. 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 Reproductive: Status post hysterectomy. No adnexal masses. Other: No abdominal wall hernia or abnormality. No abdominopelvic ascites. Musculoskeletal: Status post right total hip arthroplasty. Status post surgical posterior fusion extending from L1-L4. No acute osseous abnormality seen. Review of the MIP images confirms the above findings. IMPRESSION: No definite evidence of pulmonary embolus. Bilateral lung opacities are noted concerning for multifocal pneumonia. Small right pleural effusion is noted. Stable 1 cm pancreatic cyst. Most likely a pseudocyst. Indolent neoplasm, such as intraductal papillary mucinous tumor could look similar. Per consensus criteria, follow-up with preferably pre and post contrast abdominal MRI at 1 year is recommended. This recommendation follows ACR consensus guidelines: Managing Incidental Findings on Abdominal CT: White Paper of the ACR Incidental Findings Committee. J Am Coll Radiol 2010;7:754-773. Status post stent graft repair of infrarenal abdominal aortic aneurysm. Graft and its limbs are widely patent. 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 Sigmoid diverticulosis without inflammation. Electronically Signed   By: Marijo Conception M.D.   On: 05/05/2021 17:51   DG Chest Port 1 View  Result Date: 05/05/2021 CLINICAL DATA:  Productive cough. EXAM: PORTABLE CHEST 1 VIEW COMPARISON:  December 20, 2018. FINDINGS: Stable cardiomegaly. Single lead left-sided pacemaker is noted. Minimal bibasilar subsegmental atelectasis or scar is noted. Bony thorax is unremarkable. IMPRESSION: Minimal bibasilar subsegmental atelectasis or scarring. Aortic Atherosclerosis (ICD10-I70.0). Electronically Signed   By: Marijo Conception M.D.   On: 05/05/2021 14:22         Scheduled Meds:  apixaban  2.5 mg Oral BID   vitamin C  500 mg Oral Daily  cholecalciferol  25 mcg Oral Daily   citalopram  20 mg Oral Daily   citalopram  5 mg Oral Daily   cloNIDine  0.1 mg Oral TID   diltiazem  120 mg Oral Daily   irbesartan  150 mg Oral BID   melatonin  3 mg Oral QHS   methylPREDNISolone (SOLU-MEDROL) injection  0.5 mg/kg Intravenous Q12H   Followed by   Derrill Memo ON 05/08/2021] predniSONE  50 mg Oral Daily   metoprolol succinate  25 mg Oral BID   mirtazapine  15 mg Oral QHS   molnupiravir EUA  4 capsule Oral BID   pantoprazole  40 mg Oral Daily   rosuvastatin  5 mg Oral Daily   zinc sulfate  220 mg Oral Daily   Continuous Infusions:  sodium chloride     azithromycin Stopped (05/05/21 2215)   cefTRIAXone (ROCEPHIN)  IV Stopped (05/05/21 2045)     LOS: 1 day    Time spent: 35 minutes    Barb Merino, MD [facility] Pager [phone]

## 2021-05-06 NOTE — Progress Notes (Signed)
Patient arrived to unit with NaCl running, potassium just finished. Son came to bedside to check on patient. Will continue to monitor.

## 2021-05-06 NOTE — Progress Notes (Signed)
Pharmacist Medication Note  Current inpatient [facility] dose Irbesartan 150mg  bid.  I contacted and confirmed with [place] that  Valsartan prescribed to take 80 mg once daily per [name] cardiologist on 06/18/2020.    Per [facility] she took Valsartan 80mg  BID from 12/01/2019 to 05/2020 then dose  was decreased to 80mg  daily on 06/18/2020 per  [facility] cardiologist.    I notified Dr. Raelyn Mora  who said to change Irbesartan dose to 75 mg once daily which is appropriate formulary substitution for Valsartan 80mg  once daily.    Molnupiravir  800 mg po BID x 5 day course started 05/03/21 PM  on discharge from prior 1st [facility] ED visit on 05/03/21. Prescription for Molnupiravir was sent to [place]. I confirmed with patient's son, Jenel Gierke, who counted remaining tablets 28 of 40 tabs left, thus she started taking this med on 1/28 AM (none given in [facility] ED).  Last taken prior to this admission on 1/29 AM.  I  modified the end date to 05/08/21 after PM dose to complete 5 days of treatment and notified Dr. Raelyn Mora.   Nicole Cella, RPh Clinical Pharmacist 05/06/2021 4:01 PM

## 2021-05-06 NOTE — Evaluation (Signed)
Physical Therapy Evaluation Patient Details Name: Karina [name] MRN: [id number] DOB: 1922-12-13 Today's Date: 05/06/2021  History of Present Illness  Patient is a 88 yo female presenting to the ED on 1/29 with low O2 saturations. Patient found to be Covid+ and pneumonia and endorsing nausea and vomiting. Patient previously presenting to ED on 1/27 with abdominal pain, where CT of abdomen pelvis showed abdominal aneurysm and iliac aneurysm had increased in size but showed no endovascular leak and ER physician discussed with vascular surgeon at [facility] advised follow-up as outpatient. PMH includes: CAD status post stenting, atrial fibrillation, tachybradycardia syndrome, complete heart block status post pacemaker, hypertension, abdominal aortic aneurysm status post endovascular repair.  Clinical Impression  Pt admitted with above diagnosis. Pt was able to ambulate with min assist in room with cues for pursed lip breathing as she desaturated on RA to 74% and needed to reapply O2.  HR also to 161 bpm with activity therefore terminated walking after 52feet.  Will progress pt as able.  Pt currently with functional limitations due to the deficits listed below (see PT Problem List). Pt will benefit from skilled PT to increase their independence and safety with mobility to allow discharge to the venue listed below.          Recommendations for follow up therapy are one component of a multi-disciplinary discharge planning process, led by the attending physician.  Recommendations may be updated based on patient status, additional functional criteria and insurance authorization.  Follow Up Recommendations Home health PT    Assistance Recommended at Discharge Intermittent Supervision/Assistance  Patient can return home with the following  A little help with walking and/or transfers;A little help with bathing/dressing/bathroom    Equipment Recommendations None recommended by PT   Recommendations for Other Services       Functional Status Assessment Patient has had a recent decline in their functional status and demonstrates the ability to make significant improvements in function in a reasonable and predictable amount of time.     Precautions / Restrictions Precautions Precautions: Fall Precaution Comments: Watch O2, on 2L due to Covid+ Restrictions Weight Bearing Restrictions: No      Mobility  Bed Mobility Overal bed mobility: Needs Assistance Bed Mobility: Supine to Sit, Sit to Supine     Supine to sit: Min guard Sit to supine: Min guard   General bed mobility comments: Extra time needed and use of bed rails for stability    Transfers Overall transfer level: Needs assistance Equipment used: Rolling walker (2 wheels) Transfers: Sit to/from Stand Sit to Stand: Min assist           General transfer comment: good hand placement with RW, min A due to unsteadiness    Ambulation/Gait Ambulation/Gait assistance: Min assist, Min guard Gait Distance (Feet): 60 Feet Assistive device: Rolling walker (2 wheels) Gait Pattern/deviations: Step-through pattern, Decreased stride length, Drifts right/left, Trunk flexed   Gait velocity interpretation: <1.31 ft/sec, indicative of household ambulator   General Gait Details: Pt did some laps in room. Did desaturate to 74% on RA therefore replaced O2 at 2L  and took a few min to return to 90%.  Had to cut walk short as pt HR up to 160 bpm with activity.  Recovered to 74 bpm once she was back in bed.  Pt educated in pursed lip breathing due to desaturation.  Stairs            Wheelchair Mobility    Modified Rankin (Stroke Patients  Only)       Balance Overall balance assessment: Mild deficits observed, not formally tested                                           Pertinent Vitals/Pain Pain Assessment Pain Assessment: No/denies pain    Home Living Family/patient expects to  be discharged to:: Private residence Living Arrangements: Alone Available Help at Discharge: Family;Available PRN/intermittently Type of Home: House Home Access: Stairs to enter Entrance Stairs-Rails: Right Entrance Stairs-Number of Steps: 1 Alternate Level Stairs-Number of Steps:  (Three level historic home, has lift chair that takes her up and down the stairs) Home Layout: Multi-level Home Equipment: Conservation officer, nature (2 wheels);Cane - single point;Grab bars - toilet;Grab bars - tub/shower Additional Comments: Still drives, has maid that cleans the house, does not like to cook    Prior Function Prior Level of Function : Independent/Modified Independent;Driving               ADLs Comments: Able to complete all ADLs with some extra time     Hand Dominance   Dominant Hand: Right    Extremity/Trunk Assessment   Upper Extremity Assessment Upper Extremity Assessment: Defer to OT evaluation    Lower Extremity Assessment Lower Extremity Assessment: Generalized weakness    Cervical / Trunk Assessment Cervical / Trunk Assessment: Kyphotic (Minimally)  Communication   Communication: HOH  Cognition Arousal/Alertness: Awake/alert Behavior During Therapy: WFL for tasks assessed/performed Overall Cognitive Status: Within Functional Limits for tasks assessed                                          General Comments      Exercises     Assessment/Plan    PT Assessment Patient needs continued PT services  PT Problem List Decreased activity tolerance;Decreased balance;Decreased mobility;Decreased knowledge of use of DME;Decreased safety awareness;Decreased knowledge of precautions;Cardiopulmonary status limiting activity       PT Treatment Interventions DME instruction;Gait training;Functional mobility training;Therapeutic activities;Therapeutic exercise;Balance training;Patient/family education;Stair training    PT Goals (Current goals can be found in the  Care Plan section)  Acute Rehab PT Goals Patient Stated Goal: to go home PT Goal Formulation: With patient Time For Goal Achievement: 05/20/21 Potential to Achieve Goals: Good    Frequency Min 3X/week     Co-evaluation               AM-PAC PT "6 Clicks" Mobility  Outcome Measure Help needed turning from your back to your side while in a flat bed without using bedrails?: None Help needed moving from lying on your back to sitting on the side of a flat bed without using bedrails?: None Help needed moving to and from a bed to a chair (including a wheelchair)?: A Little Help needed standing up from a chair using your arms (e.g., wheelchair or bedside chair)?: A Little Help needed to walk in hospital room?: A Little Help needed climbing 3-5 steps with a railing? : A Lot 6 Click Score: 19    End of Session Equipment Utilized During Treatment: Gait belt;Oxygen Activity Tolerance: Patient limited by fatigue Patient left: in bed;with call bell/phone within reach;with bed alarm set Nurse Communication: Mobility status PT Visit Diagnosis: Unsteadiness on feet (R26.81);Muscle weakness (generalized) (M62.81)    Time: 1410-1440  PT Time Calculation (min) (ACUTE ONLY): 30 min   Charges:   PT Evaluation $PT Eval Moderate Complexity: 1 Mod PT Treatments $Gait Training: 8-22 mins        Solara Goodchild M,PT Acute Rehab Services [phone] (pager)  Alvira Philips 05/06/2021, 3:54 PM

## 2021-05-06 NOTE — TOC Progression Note (Signed)
Transition of Care [facility]) - Progression Note    Patient Details  Name: Karina [name] MRN: [id number] Date of Birth: 03-21-1923  Transition of Care [facility]) CM/SW Contact  Zenon Mayo, RN Phone Number: 05/06/2021, 1:03 PM  Clinical Narrative:     Transition of Care [facility]) Screening Note   Patient Details  Name: Karina [name] Date of Birth: 1922/07/08   Transition of Care [facility]) CM/SW Contact:    Zenon Mayo, RN Phone Number: 05/06/2021, 1:04 PM    Transition of Care Department [facility]) has reviewed patient and no TOC needs have been identified at this time. We will continue to monitor patient advancement through interdisciplinary progression rounds. If new patient transition needs arise, please place a TOC consult.          Expected Discharge Plan and Services                                                 Social Determinants of Health (SDOH) Interventions    Readmission Risk Interventions No flowsheet data found.

## 2021-05-07 ENCOUNTER — Encounter: Payer: Medicare Other | Admitting: Internal Medicine

## 2021-05-07 ENCOUNTER — Inpatient Hospital Stay (HOSPITAL_COMMUNITY): Payer: Medicare Other

## 2021-05-07 DIAGNOSIS — I4821 Permanent atrial fibrillation: Secondary | ICD-10-CM

## 2021-05-07 DIAGNOSIS — E7800 Pure hypercholesterolemia, unspecified: Secondary | ICD-10-CM | POA: Diagnosis not present

## 2021-05-07 DIAGNOSIS — I495 Sick sinus syndrome: Secondary | ICD-10-CM

## 2021-05-07 DIAGNOSIS — I10 Essential (primary) hypertension: Secondary | ICD-10-CM | POA: Diagnosis not present

## 2021-05-07 LAB — CBC WITH DIFFERENTIAL/PLATELET
Abs Immature Granulocytes: 0.08 10*3/uL — ABNORMAL HIGH (ref 0.00–0.07)
Basophils Absolute: 0 10*3/uL (ref 0.0–0.1)
Basophils Relative: 0 %
Eosinophils Absolute: 0 10*3/uL (ref 0.0–0.5)
Eosinophils Relative: 0 %
HCT: 34.2 % — ABNORMAL LOW (ref 36.0–46.0)
Hemoglobin: 11.4 g/dL — ABNORMAL LOW (ref 12.0–15.0)
Immature Granulocytes: 1 %
Lymphocytes Relative: 7 %
Lymphs Abs: 0.7 10*3/uL (ref 0.7–4.0)
MCH: 29.4 pg (ref 26.0–34.0)
MCHC: 33.3 g/dL (ref 30.0–36.0)
MCV: 88.1 fL (ref 80.0–100.0)
Monocytes Absolute: 0.7 10*3/uL (ref 0.1–1.0)
Monocytes Relative: 7 %
Neutro Abs: 8.4 10*3/uL — ABNORMAL HIGH (ref 1.7–7.7)
Neutrophils Relative %: 85 %
Platelets: 184 10*3/uL (ref 150–400)
RBC: 3.88 MIL/uL (ref 3.87–5.11)
RDW: 13.5 % (ref 11.5–15.5)
WBC: 10 10*3/uL (ref 4.0–10.5)
nRBC: 0 % (ref 0.0–0.2)

## 2021-05-07 LAB — COMPREHENSIVE METABOLIC PANEL
ALT: 24 U/L (ref 0–44)
AST: 40 U/L (ref 15–41)
Albumin: 2.6 g/dL — ABNORMAL LOW (ref 3.5–5.0)
Alkaline Phosphatase: 50 U/L (ref 38–126)
Anion gap: 10 (ref 5–15)
BUN: 20 mg/dL (ref 8–23)
CO2: 23 mmol/L (ref 22–32)
Calcium: 8.2 mg/dL — ABNORMAL LOW (ref 8.9–10.3)
Chloride: 95 mmol/L — ABNORMAL LOW (ref 98–111)
Creatinine, Ser: 0.82 mg/dL (ref 0.44–1.00)
GFR, Estimated: >60 mL/min (ref >60)
Glucose, Bld: 125 mg/dL — ABNORMAL HIGH (ref 70–99)
Potassium: 3.4 mmol/L — ABNORMAL LOW (ref 3.5–5.1)
Sodium: 128 mmol/L — ABNORMAL LOW (ref 135–145)
Total Bilirubin: 0.3 mg/dL (ref 0.3–1.2)
Total Protein: 5.5 g/dL — ABNORMAL LOW (ref 6.5–8.1)

## 2021-05-07 LAB — C-REACTIVE PROTEIN: CRP: 4.9 mg/dL — ABNORMAL HIGH (ref <1.0)

## 2021-05-07 LAB — TROPONIN I (HIGH SENSITIVITY): Troponin I (High Sensitivity): 33 ng/L — ABNORMAL HIGH (ref <18)

## 2021-05-07 MED ORDER — IRBESARTAN 75 MG PO TABS
75.0000 mg | ORAL_TABLET | Freq: Every day | ORAL | Status: DC
  Administered 2021-05-08: 75 mg via ORAL
  Filled 2021-05-07: qty 1

## 2021-05-07 MED ORDER — LORAZEPAM 2 MG/ML IJ SOLN
INTRAMUSCULAR | Status: AC
  Filled 2021-05-07: qty 1

## 2021-05-07 MED ORDER — LACTATED RINGERS IV BOLUS
500.0000 mL | Freq: Once | INTRAVENOUS | Status: AC
  Administered 2021-05-07: 500 mL via INTRAVENOUS

## 2021-05-07 MED ORDER — KETOROLAC TROMETHAMINE 15 MG/ML IJ SOLN
15.0000 mg | Freq: Once | INTRAMUSCULAR | Status: AC
  Administered 2021-05-07: 15 mg via INTRAVENOUS
  Filled 2021-05-07: qty 1

## 2021-05-07 MED ORDER — PROCHLORPERAZINE EDISYLATE 10 MG/2ML IJ SOLN
10.0000 mg | Freq: Once | INTRAMUSCULAR | Status: AC
  Administered 2021-05-07: 10 mg via INTRAVENOUS
  Filled 2021-05-07 (×2): qty 2

## 2021-05-07 MED ORDER — FUROSEMIDE 10 MG/ML IJ SOLN
40.0000 mg | Freq: Once | INTRAMUSCULAR | Status: AC
  Administered 2021-05-07: 40 mg via INTRAVENOUS
  Filled 2021-05-07: qty 4

## 2021-05-07 MED ORDER — POTASSIUM CHLORIDE 20 MEQ PO PACK
40.0000 meq | PACK | Freq: Two times a day (BID) | ORAL | Status: AC
  Administered 2021-05-07 – 2021-05-08 (×2): 40 meq via ORAL
  Filled 2021-05-07 (×2): qty 2

## 2021-05-07 MED ORDER — IOHEXOL 350 MG/ML SOLN
100.0000 mL | Freq: Once | INTRAVENOUS | Status: AC | PRN
  Administered 2021-05-07: 100 mL via INTRAVENOUS

## 2021-05-07 NOTE — Progress Notes (Signed)
Progress Note   Patient: Karina [name] HGD:[id number] DOB: 07-14-1922 DOA: 05/05/2021     2 DOS: the patient was seen and examined on 05/07/2021       Brief hospital course: [name] is a 88 y.o. F with pAF, tachy-brady syndrome with pacer, HTN, hx AAA s/p endovascular repair, CAD sp PCI >1 yr who was diagnosed with COVID a few days PTA, started molnupiravir, and then presented with abdominal discomfort, malaise and low oxygen at home.    In the ER, SpO2 low, requiring up to 6L supplemental O2.  CTA showed no PE, but bilateral pneumonia.  Admitted and started on Actemra, antibiotics.      Assessment and Plan: Acute respiratory failure with hypoxia due to COVID-19 pneumonia COVID-19 pneumonia Had had a partial course of molnupiravir and failed.  Here treated with Actemra, transition to steroids.  Now weaning off oxygen.  No significant encephalopathy.  Reports poor oral intake.  Still on 2L O2 - Continue prednisone - Continue molnupiravir - IS and flutter - Consult dietitian  Possible community aquired pneumonia -Continue Rocephin and azithromycin    Chronic systolic and diastolic CHF Echocardiogram obtained, shows grade 3 diastolic dysfunction, EF 40 to 45%.  Unknown chronicity.  Not in florid CHF at this time. - IV Lasix x1 now - Trend intake and output - Repeat BMP tomorrow  Hypertension Hypertensive urgency Blood pressure severely elevated on admission without endorgan damage.  Currently blood pressure controlled - Hold clonidine and irbesartan for today - Continue diltiazem, metoprolol  Chronic atrial fibrillation Tachybradycardia syndrome History of pacemaker -Continue diltiazem, BB  Coronary artery disease -Continue Crestor  Hypokalemia - Supplement K  Hyponatremia Na slightly down. - Lasix and trend BMP  Posterior pharyngeal ulcer Nothing on my exam.   - ENT follow up as outpatient  Stage I buttocks pressure injury, POA  Mood  disorder -Continue celexa      Subjective: Patient has productive cough, no confusion or fever.  No respiratory distress or chest discomfort.  Physical Exam: Vitals:   05/07/21 0814 05/07/21 0900 05/07/21 1144 05/07/21 1223  BP: 93/61  120/79   Pulse: 78 75 85 73  Resp: 15 16 18    Temp: (!) 97.4 F (36.3 C)  (!) 97.5 F (36.4 C)   TempSrc: Oral  Oral   SpO2: 95% 100% 100% 94%  Weight:      Height:       Elderly adult female, lying in bed, no acute distress, very hard of hearing. Heart rate regular, no murmurs, no lower extremity edema, JVP normal Lung sounds clear without rales or wheezes.  Coughs occasionally.  Nasal cannula in place Abdomen soft without tenderness palpation or guarding, no ascites or distention Awake and alert, extraocular movements intact, moves all extremities with normal strength and coordination, speech fluent Attention normal, affect appropriate, judgment insight appear normal   Data Reviewed: Labs and imaging are notable for echocardiogram showing reduced EF, grade 3 diastolic dysfunction Metabolic panel notable for low potassium, low sodium. LFTs normal CRP improving Complete blood count shows normal white bloods, red blood cells, and platelets. CT of the neck shows a posterior pharyngeal ulcer possibly, no mass       Family Communication: Son at the bedside  Disposition: Status is: Inpatient Remains inpatient appropriate because: She still requires supplemental O2  Likely wil lbe able to wean O2 off this afternoon, mobilize and discharge tomorrow with [facility]    Planned Discharge Destination: Home with [place]  Author: Edwin Dada, MD 05/07/2021 12:36 PM  For on call review [email or web address].

## 2021-05-07 NOTE — Progress Notes (Signed)
Mobility Specialist Progress Note:   05/07/21 1437  Mobility  Activity Ambulated with assistance in room  Level of Assistance Standby assist, set-up cues, supervision of patient - no hands on  Assistive Device Front wheel walker  Distance Ambulated (ft) 80 ft  Activity Response Tolerated well  $Mobility charge 1 Mobility   Pt received in chair willing to participate in mobility. No complaints of pain and asymptomatic. Pt left in bed with call bell in reach and all needs met.   [facility] Public librarian Phone [phone] Secondary Phone [phone]

## 2021-05-07 NOTE — TOC Progression Note (Addendum)
Transition of Care [facility]) - Progression Note    Patient Details  Name: Karina [name] MRN: [id number] Date of Birth: 02/25/1923  Transition of Care [facility]) CM/SW Contact  Zenon Mayo, RN Phone Number: 05/07/2021, 1:09 PM  Clinical Narrative:    NCM called son, left message for a return call. NCM called son again, he answered , he states that she has an aide that comes in 4 or 5 hrs /day to do cooking and shopping for her. She has a walker , stair chair, hand rails , raised toilet seat.  She lives alone.  NCM offered choice, he states she had someone who came out before but could not remember the name. Marland Kitchen  He also states he will have someone to stay with her over night as well.  NCM made referral to [facility] with [place] for [place], [place], [place]. She is able to take referral. Soc will begin 24 to 48 hrs post dc.         Expected Discharge Plan and Services                                                 Social Determinants of Health (SDOH) Interventions    Readmission Risk Interventions No flowsheet data found.

## 2021-05-07 NOTE — TOC Progression Note (Signed)
Transition of Care [facility]) - Progression Note    Patient Details  Name: Karina [name] MRN: [id number] Date of Birth: [birth year]  Transition of Care [facility]) CM/SW Contact  Zenon Mayo, RN Phone Number: 05/07/2021, 1:25 PM  Clinical Narrative:    NCM called son, left message for a return call. NCM called son again, he answered , he states that she has an aide that comes in 4 or 5 hrs /day to do cooking and shopping for her. She has a walker , stair chair, hand rails , raised toilet seat.  She lives alone.  NCM offered choice, he states she had someone who came out before but could not remember the name. Marland Kitchen  He also states he will have someone to stay with her over night as well.  NCM made referral to [facility] with [place] for [place], [place], [place]. She is able to take referral. Soc will begin 24 to 48 hrs post dc.      Expected Discharge Plan: [place] Barriers to Discharge: Continued Medical Work up  Expected Discharge Plan and Services Expected Discharge Plan: [place]   Discharge Planning Services: CM Consult Post Acute Care Choice: Home Health                     DME Agency: NA       HH Arranged: RN, Disease Management, PT, OT HH Agency: [place] Date [place]: 05/07/21 Time [place]: [id number] Representative spoke with at [place]: [place] ([place]) Interventions    Readmission Risk Interventions No flowsheet data found.

## 2021-05-07 NOTE — Progress Notes (Addendum)
Notified by RN that pt complaining of sudden onset of abdominal pain that she rates as 9/10.  BP has dropped to 78/68 but was just 113/76 an hour ago. Also has pain in right posterior shoulder.  No SOB. O2 sats stable.  Has hx of AAA that was over 6 cm on CTA abdomen a few days ago.  Pt admitted with COVID PNA, has hx of chronic HFpEF, Chronic a-fib, CAD.  Pt denies chest pain or pressure. No diaphoresis  Pt examined at bedside.  Diffuse mid to lower abdominal pain, abdomen nondistended. No masses palpated.  CV-RRR, no murmur. Lungs-diminished breath sounds in bases, no rhonchi or wheezing.   Pt will be given IVF bolus of 500 ml LR.  Monitor BP closely. May need further fluids if BP does not improve.  Obtain CTA abdomen and pelvis to evaluate for AAA leak or rupture with abdominal pain and low BP coming on suddenly.  Given toradol 15 mg IV once for pain.  Given compazine one time for nausea.  Check troponin levels  Addendum: CT scan shows no acute intraabdominal process and no AAA leak.  Pain resolved after toradol

## 2021-05-08 DIAGNOSIS — Z950 Presence of cardiac pacemaker: Secondary | ICD-10-CM

## 2021-05-08 LAB — CBC WITH DIFFERENTIAL/PLATELET
Abs Immature Granulocytes: 0.09 10*3/uL — ABNORMAL HIGH (ref 0.00–0.07)
Basophils Absolute: 0 10*3/uL (ref 0.0–0.1)
Basophils Relative: 0 %
Eosinophils Absolute: 0 10*3/uL (ref 0.0–0.5)
Eosinophils Relative: 0 %
HCT: 37 % (ref 36.0–46.0)
Hemoglobin: 12.7 g/dL (ref 12.0–15.0)
Immature Granulocytes: 1 %
Lymphocytes Relative: 8 %
Lymphs Abs: 0.7 10*3/uL (ref 0.7–4.0)
MCH: 29.9 pg (ref 26.0–34.0)
MCHC: 34.3 g/dL (ref 30.0–36.0)
MCV: 87.1 fL (ref 80.0–100.0)
Monocytes Absolute: 0.8 10*3/uL (ref 0.1–1.0)
Monocytes Relative: 9 %
Neutro Abs: 7.5 10*3/uL (ref 1.7–7.7)
Neutrophils Relative %: 82 %
Platelets: 236 10*3/uL (ref 150–400)
RBC: 4.25 MIL/uL (ref 3.87–5.11)
RDW: 13.7 % (ref 11.5–15.5)
WBC: 9 10*3/uL (ref 4.0–10.5)
nRBC: 0 % (ref 0.0–0.2)

## 2021-05-08 LAB — COMPREHENSIVE METABOLIC PANEL
ALT: 41 U/L (ref 0–44)
AST: 56 U/L — ABNORMAL HIGH (ref 15–41)
Albumin: 3 g/dL — ABNORMAL LOW (ref 3.5–5.0)
Alkaline Phosphatase: 61 U/L (ref 38–126)
Anion gap: 11 (ref 5–15)
BUN: 22 mg/dL (ref 8–23)
CO2: 23 mmol/L (ref 22–32)
Calcium: 8.6 mg/dL — ABNORMAL LOW (ref 8.9–10.3)
Chloride: 96 mmol/L — ABNORMAL LOW (ref 98–111)
Creatinine, Ser: 0.83 mg/dL (ref 0.44–1.00)
GFR, Estimated: >60 mL/min (ref >60)
Glucose, Bld: 141 mg/dL — ABNORMAL HIGH (ref 70–99)
Potassium: 4.1 mmol/L (ref 3.5–5.1)
Sodium: 130 mmol/L — ABNORMAL LOW (ref 135–145)
Total Bilirubin: 0.5 mg/dL (ref 0.3–1.2)
Total Protein: 6.1 g/dL — ABNORMAL LOW (ref 6.5–8.1)

## 2021-05-08 LAB — TROPONIN I (HIGH SENSITIVITY): Troponin I (High Sensitivity): 28 ng/L — ABNORMAL HIGH (ref <18)

## 2021-05-08 LAB — C-REACTIVE PROTEIN: CRP: 1.8 mg/dL — ABNORMAL HIGH (ref <1.0)

## 2021-05-08 MED ORDER — DEXAMETHASONE 6 MG PO TABS: 6.0000 mg | ORAL_TABLET | Freq: Every day | ORAL | 0 refills | Status: AC

## 2021-05-08 MED ORDER — CEFDINIR 300 MG PO CAPS: 300.0000 mg | ORAL_CAPSULE | Freq: Two times a day (BID) | ORAL | 0 refills | Status: DC

## 2021-05-08 NOTE — TOC Transition Note (Addendum)
Transition of Care [facility]) - CM/SW Discharge Note   Patient Details  Name: Karina [name] MRN: [id number] Date of Birth: 12/08/1922  Transition of Care [facility]) CM/SW Contact:  Karina Mayo, RN Phone Number: 05/08/2021, 1:13 PM   Clinical Narrative:    Patient is for dc today, NCM notified Stacie with [place].  Follow up apt made for patient with PCP , scheduled for 2/8 at 12:45.  NCM sent this information to patient son, Karina [name].     Final next level of care: [place] Barriers to Discharge: Continued Medical Work up   Patient Goals and CMS Choice Patient states their goals for this hospitalization and ongoing recovery are:: return home CMS Medicare.gov Compare Post Acute Care list provided to:: Patient Represenative (must comment) Choice offered to / list presented to : Adult Children  Discharge Placement                       Discharge Plan and Services   Discharge Planning Services: CM Consult Post Acute Care Choice: Home Health            DME Agency: NA       HH Arranged: RN, Disease Management, PT, OT HH Agency: [place] Date [facility] Agency Contacted: 05/07/21 Time [place]: [id number] Representative spoke with at [place]: [place] Determinants of Health ([place]) Interventions     Readmission Risk Interventions No flowsheet data found.

## 2021-05-08 NOTE — Progress Notes (Signed)
Mobility Specialist Progress Note:   05/08/21 1042  Mobility  Bed Position Chair  Activity Ambulated with assistance in room  Level of Assistance Contact guard assist, steadying assist  Assistive Device Front wheel walker  Distance Ambulated (ft) 100 ft  Activity Response Tolerated well  $Mobility charge 1 Mobility   Pt received in bed willing to participate in mobility. No complaints of pain and asymptomatic. Pt left in chair with call bell in reach and all needs met.   [facility] Public librarian Phone [phone] Secondary Phone [phone]

## 2021-05-08 NOTE — Progress Notes (Signed)
Mobility Specialist Progress Note:  SATURATION QUALIFICATIONS: (This note is used to comply with regulatory documentation for home oxygen)  Patient Saturations on Room Air at Rest = 97%  Patient Saturations on Room Air while Ambulating = 90%-93%  Patient Saturations on n/a Liters of oxygen while Ambulating = n/a%  Hospital doctor Phone [phone] Secondary Phone [phone]

## 2021-05-08 NOTE — Discharge Summary (Addendum)
Physician Discharge Summary   Patient: Karina [name] MRN: [id number] DOB: July 11, 1922  Admit date:     05/05/2021  Discharge date: 05/08/21  Discharge Physician: Patrecia Pour   PCP: [name], MD   Recommendations at discharge:   Follow up with cardiology after isolation period completed.   Discharge Diagnoses: Principal Problem:   Acute respiratory failure due to COVID-19 [facility]) Active Problems:   Atrial fibrillation (HCC)   Cardiac pacemaker in situ   Hypertensive urgency   Tachy-brady syndrome (HCC)   Benign essential HTN   Pressure injury of skin  Hospital Course: [name] is a 88 y.o. F with pAF, tachy-brady syndrome with pacer, HTN, hx AAA s/p endovascular repair, CAD sp PCI >1 yr who was diagnosed with COVID a few days PTA, started molnupiravir, and then presented with abdominal discomfort, malaise and low oxygen at home.     In the ER, SpO2 low, requiring up to 6L supplemental O2.  CTA showed no PE, but bilateral pneumonia.  Admitted and started on Actemra, antibiotics. Ultimately showed significant improvement, resolution of hypoxia and was discharged in stable condition on antibiotics and steroids.   Assessment and Plan: Acute respiratory failure with hypoxia due to COVID-19 pneumonia COVID-19 pneumonia Had had a partial course of molnupiravir and failed.  Here treated with Actemra, transition to steroids.  Now weaned off oxygen.  No significant encephalopathy.  Reports poor oral intake that is starting to improve, no longer requiring oxygen.  - Complete steroid taper with decadron. Complete molnupiravir this evening.    Possible community aquired pneumonia - Received ceftriaxone, azithromycin with improvement. Given use of actemra, will complete a typical course of antibiotics.   Chronic systolic and diastolic CHF Echocardiogram obtained, shows grade 3 diastolic dysfunction, EF 40 to 45%.  Unknown chronicity.  Not in florid CHF at this time. - Follow up with  cardiology recommended.   Hypertension Hypertensive urgency Blood pressure severely elevated on admission without endorgan damage.  Currently blood pressure controlled - Restart home medications  Chronic atrial fibrillation Tachybradycardia syndrome History of pacemaker -Continue diltiazem, BB  Coronary artery disease -Continue Crestor  Hypokalemia - Supplement K  Hyponatremia Na slightly down. - Lasix and trend BMP  Posterior pharyngeal ulcer Nothing on my exam.   - ENT follow up as outpatient   Stage I buttocks pressure injury, POA   Mood disorder -Continue celexa  Left medial stage I buttocks POA: Offload as able.   Consultants: None Procedures performed: None  Disposition: Home Diet recommendation:  Discharge Diet Orders (From admission, onward)     Start     Ordered   05/08/21 0000  Diet - low sodium heart healthy        05/08/21 1322           Cardiac and Carb modified diet  DISCHARGE MEDICATION: Allergies as of 05/08/2021       Reactions   Ace Inhibitors Hives   Amiodarone Other (See Comments)   Worsening peripheral neuropathy   Tikosyn [dofetilide] Other (See Comments)   QT prolongation   Meperidine And Related Other (See Comments)   Unknown reaction        Medication List     TAKE these medications    apixaban 2.5 MG Tabs tablet Commonly known as: ELIQUIS Take 1 tablet (2.5 mg total) 2 (two) times daily by mouth.   cefdinir 300 MG capsule Commonly known as: OMNICEF Take 1 capsule (300 mg total) by mouth 2 (two) times daily.  citalopram 10 MG tablet Commonly known as: CELEXA Take 5 mg by mouth every morning.   cloNIDine 0.1 MG tablet Commonly known as: CATAPRES Take 0.1 mg by mouth 3 (three) times daily.   dexamethasone 6 MG tablet Commonly known as: Decadron Take 1 tablet (6 mg total) by mouth daily for 4 days. Start taking on: May 09, 2021   diltiazem 120 MG 24 hr capsule Commonly known as: CARDIZEM CD Take 120  mg by mouth every morning.   furosemide 40 MG tablet Commonly known as: LASIX Take 40 mg by mouth daily as needed for fluid or edema.   metoprolol succinate 25 MG 24 hr tablet Commonly known as: TOPROL-XL Take 25 mg by mouth 2 (two) times daily.   mirtazapine 15 MG disintegrating tablet Commonly known as: REMERON SOL-TAB Take 15 mg by mouth every morning.   molnupiravir EUA 200 mg Caps capsule Commonly known as: LAGEVRIO Take 4 capsules (800 mg total) by mouth 2 (two) times daily for 5 days.   pantoprazole 40 MG tablet Commonly known as: PROTONIX Take 40 mg by mouth every morning.   polyethylene glycol powder 17 GM/SCOOP powder Commonly known as: GLYCOLAX/MIRALAX Take 17 g by mouth daily as needed (constipation).   QUERCETIN PO Take 1 tablet by mouth daily.   rosuvastatin 5 MG tablet Commonly known as: CRESTOR Take 1 tablet (5 mg total) by mouth daily. What changed: when to take this   valsartan 80 MG tablet Commonly known as: DIOVAN Take 80 mg by mouth every morning.   VITAMIN C PO Take 1 tablet by mouth every morning.   VITAMIN D3 PO Take 1 capsule by mouth every morning.   Zinc Gluconate 100 MG Tabs Take 100 mg by mouth every morning.        Follow-up Information     [facility] [place] Follow [facility].   Specialty: Home Health Services Why: Home Health Nurse, Home Health physical therapy, Home [facility] occupational therapy- Agency will contact your with apt times. Contact information: [address] [place] [address] [phone]         [name], MD. Schedule an appointment as soon as possible for a visit in 1 week(s).   Specialty: Internal Medicine Contact information: [place] [address] [phone]         Evans Lance, MD Follow up in 1 month(s).   Specialty: Cardiology Contact information: [place] [address] [phone]                Subjective: Oral intake improved, no  dyspnea, mentating normally. Amenable to discharge, thinking she'll do better with mobility at home than here and I agree.   Discharge Exam: Filed Weights   05/06/21 0015 05/07/21 0505 05/08/21 0300  Weight: 55.6 kg 55.8 kg 56.7 kg   Nontoxic elderly female  Clear, nonlabored  Condition at discharge: stable  The results of significant diagnostics from this hospitalization (including imaging, microbiology, ancillary and laboratory) are listed below for reference.   Imaging Studies: DG Elbow 2 Views Right  Result Date: 05/03/2021 CLINICAL DATA:  Contrast extravasation. EXAM: RIGHT ELBOW - 2 VIEW COMPARISON:  None. FINDINGS: Antecubital IV is present. There is a large amount of hyperdense contrast throughout deep and superficial soft tissues of the anterior in mid upper arm. No acute fracture or dislocation. The bones are diffusely osteopenic. There is chronic deformity of the radial head. There is degenerative narrowing of the elbow joint.  IMPRESSION: 1. Large amount of contrast seen within the superficial and deep soft tissues of the upper arm. 2. No acute fracture or dislocation. Electronically Signed   By: Ronney Asters M.D.   On: 05/03/2021 19:31   CT SOFT TISSUE NECK WO CONTRAST  Result Date: 05/06/2021 CLINICAL DATA:  Coronavirus infection. Difficulty swallowing. Epiglottitis or tonsillitis suspected. Productive cough. EXAM: CT NECK WITHOUT CONTRAST TECHNIQUE: Multidetector CT imaging of the neck was performed following the standard protocol without intravenous contrast. RADIATION DOSE REDUCTION: This exam was performed according to the departmental dose-optimization program which includes automated exposure control, adjustment of the mA and/or kV according to patient size and/or use of iterative reconstruction technique. COMPARISON:  02/17/2017 FINDINGS: Pharynx and larynx: No evidence of aggressive appearing mucosal or submucosal mass lesion. Question 1 cm smoothly marginated lesion of the  left posterior oropharynx. This could be a mass or cyst. Direct inspection suggested. This would not be expected to result in difficulty swallowing. No inflammatory changes are seen anywhere in the region. Salivary glands: Parotid and submandibular glands are normal. Thyroid: Normal Lymph nodes: No lymphadenopathy on either side of the neck. Vascular: Ordinary atherosclerotic calcification at both carotid bifurcations. Limited intracranial: Normal Visualized orbits: Limited, negative. Mastoids and visualized paranasal sinuses: Clear Skeleton: Chronic nonunited type 2 dens fracture which was acute in 2018. 3 mm of anterolisthesis at the fracture site. Chronic degenerative mid cervical spondylosis and facet arthropathy. Upper chest: Small effusions layering dependently. Aortic atherosclerosis. Hazy alveolar filling that could represent edema or pneumonia. Other: None IMPRESSION: No definite cause of the presenting symptoms is identified. No sign of inflammatory disease or obstructing mass lesion. 1 cm smoothly marginated mucosal or submucosal lesion of the posterior oropharynx on the left. This may be incidental. Direct inspection suggested. Chronic nonunited type 2 fracture of the dens which was acute in 2018. 3 mm of anterolisthesis at this level. Carotid bifurcation atherosclerotic calcification. Small effusions. Hazy alveolar filling that could be edema or pneumonia. Electronically Signed   By: Nelson Chimes M.D.   On: 05/06/2021 09:46   CT Angio Chest Pulmonary Embolism (PE) W or WO Contrast  Result Date: 05/05/2021 CLINICAL DATA:  Hypoxia, nausea, vomiting. EXAM: CT ANGIOGRAPHY CHEST CT ABDOMEN AND PELVIS WITH CONTRAST TECHNIQUE: Multidetector CT imaging of the chest was performed using the standard protocol during bolus administration of intravenous contrast. Multiplanar CT image reconstructions and MIPs were obtained to evaluate the vascular anatomy. Multidetector CT imaging of the abdomen and pelvis was  performed using the standard protocol during bolus administration of intravenous contrast. RADIATION DOSE REDUCTION: This exam was performed according to the departmental dose-optimization program which includes automated exposure control, adjustment of the mA and/or kV according to patient size and/or use of iterative reconstruction technique. CONTRAST:  47mL OMNIPAQUE IOHEXOL 350 MG/ML SOLN COMPARISON:  May 03, 2021. FINDINGS: CTA CHEST FINDINGS Cardiovascular: Satisfactory opacification of the pulmonary arteries to the segmental level. No evidence of pulmonary embolism. Mild cardiomegaly is noted. Atherosclerosis of thoracic aorta is noted without aneurysm formation. No pericardial effusion. Mediastinum/Nodes: No enlarged mediastinal, hilar, or axillary lymph nodes. Thyroid gland, trachea, and esophagus demonstrate no significant findings. Lungs/Pleura: No pneumothorax is noted. Small right pleural effusion is noted. Patchy airspace opacities are noted in both upper lobes, right greater than left, concerning for multifocal pneumonia. Bilateral lower lobe airspace opacities are also noted concerning for pneumonia. Musculoskeletal: No chest wall abnormality. No acute or significant osseous findings. Review of the MIP images confirms the above findings. CT  ABDOMEN and PELVIS FINDINGS Hepatobiliary: No focal liver abnormality is seen. Status post cholecystectomy. No biliary dilatation. Pancreas: Stable 1 cm cyst noted in pancreatic body. No acute inflammation or ductal dilatation is noted. Spleen: Normal in size without focal abnormality. Adrenals/Urinary Tract: Adrenal glands are unremarkable. Kidneys are normal, without renal calculi, focal lesion, or hydronephrosis. Bladder is unremarkable. Stomach/Bowel: Stomach is unremarkable. There is no evidence of bowel obstruction or inflammation. Sigmoid diverticulosis is noted without inflammation. Vascular/Lymphatic: Status post stent graft repair of infrarenal  abdominal aortic aneurysm. The graft and its limbs are widely patent. Excluded aneurysmal sac has maximum measured AP diameter 6.6 cm. 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 Reproductive: Status post hysterectomy. No adnexal masses. Other: No abdominal wall hernia or abnormality. No abdominopelvic ascites. Musculoskeletal: Status post right total hip arthroplasty. Status post surgical posterior fusion extending from L1-L4. No acute osseous abnormality seen. Review of the MIP images confirms the above findings. IMPRESSION: No definite evidence of pulmonary embolus. Bilateral lung opacities are noted concerning for multifocal pneumonia. Small right pleural effusion is noted. Stable 1 cm pancreatic cyst. Most likely a pseudocyst. Indolent neoplasm, such as intraductal papillary mucinous tumor could look similar. Per consensus criteria, follow-up with preferably pre and post contrast abdominal MRI at 1 year is recommended. This recommendation follows ACR consensus guidelines: Managing Incidental Findings on Abdominal CT: White Paper of the ACR Incidental Findings Committee. J Am Coll Radiol 2010;7:754-773. Status post stent graft repair of infrarenal abdominal aortic aneurysm. Graft and its limbs are widely patent. 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 Sigmoid diverticulosis without inflammation. Electronically Signed   By: Marijo Conception M.D.   On: 05/05/2021 17:51   CT ABDOMEN PELVIS W CONTRAST  Result Date: 05/05/2021 CLINICAL DATA:  Hypoxia, nausea, vomiting. EXAM: CT ANGIOGRAPHY CHEST CT ABDOMEN AND PELVIS WITH CONTRAST TECHNIQUE: Multidetector CT imaging of the chest was performed using the standard protocol during bolus administration of intravenous contrast. Multiplanar CT image reconstructions and MIPs were obtained to evaluate the vascular anatomy. Multidetector CT imaging of the abdomen and pelvis was performed using the standard protocol during bolus administration of  intravenous contrast. RADIATION DOSE REDUCTION: This exam was performed according to the departmental dose-optimization program which includes automated exposure control, adjustment of the mA and/or kV according to patient size and/or use of iterative reconstruction technique. CONTRAST:  68mL OMNIPAQUE IOHEXOL 350 MG/ML SOLN COMPARISON:  May 03, 2021. FINDINGS: CTA CHEST FINDINGS Cardiovascular: Satisfactory opacification of the pulmonary arteries to the segmental level. No evidence of pulmonary embolism. Mild cardiomegaly is noted. Atherosclerosis of thoracic aorta is noted without aneurysm formation. No pericardial effusion. Mediastinum/Nodes: No enlarged mediastinal, hilar, or axillary lymph nodes. Thyroid gland, trachea, and esophagus demonstrate no significant findings. Lungs/Pleura: No pneumothorax is noted. Small right pleural effusion is noted. Patchy airspace opacities are noted in both upper lobes, right greater than left, concerning for multifocal pneumonia. Bilateral lower lobe airspace opacities are also noted concerning for pneumonia. Musculoskeletal: No chest wall abnormality. No acute or significant osseous findings. Review of the MIP images confirms the above findings. CT ABDOMEN and PELVIS FINDINGS Hepatobiliary: No focal liver abnormality is seen. Status post cholecystectomy. No biliary dilatation. Pancreas: Stable 1 cm cyst noted in pancreatic body. No acute inflammation or ductal dilatation is noted. Spleen: Normal in size without focal abnormality. Adrenals/Urinary Tract: Adrenal glands are unremarkable. Kidneys are normal, without renal calculi, focal lesion, or hydronephrosis. Bladder is unremarkable. Stomach/Bowel: Stomach is unremarkable. There is  no evidence of bowel obstruction or inflammation. Sigmoid diverticulosis is noted without inflammation. Vascular/Lymphatic: Status post stent graft repair of infrarenal abdominal aortic aneurysm. The graft and its limbs are widely patent.  Excluded aneurysmal sac has maximum measured AP diameter 6.6 cm. 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 Reproductive: Status post hysterectomy. No adnexal masses. Other: No abdominal wall hernia or abnormality. No abdominopelvic ascites. Musculoskeletal: Status post right total hip arthroplasty. Status post surgical posterior fusion extending from L1-L4. No acute osseous abnormality seen. Review of the MIP images confirms the above findings. IMPRESSION: No definite evidence of pulmonary embolus. Bilateral lung opacities are noted concerning for multifocal pneumonia. Small right pleural effusion is noted. Stable 1 cm pancreatic cyst. Most likely a pseudocyst. Indolent neoplasm, such as intraductal papillary mucinous tumor could look similar. Per consensus criteria, follow-up with preferably pre and post contrast abdominal MRI at 1 year is recommended. This recommendation follows ACR consensus guidelines: Managing Incidental Findings on Abdominal CT: White Paper of the ACR Incidental Findings Committee. J Am Coll Radiol 2010;7:754-773. Status post stent graft repair of infrarenal abdominal aortic aneurysm. Graft and its limbs are widely patent. 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 Sigmoid diverticulosis without inflammation. Electronically Signed   By: Marijo Conception M.D.   On: 05/05/2021 17:51   CT ABDOMEN PELVIS W CONTRAST  Result Date: 05/03/2021 CLINICAL DATA:  Acute left lower quadrant abdominal pain. EXAM: CT ABDOMEN AND PELVIS WITH CONTRAST TECHNIQUE: Multidetector CT imaging of the abdomen and pelvis was performed using the standard protocol following bolus administration of intravenous contrast. However, no contrast is seen on this exam and this is concerning for extravasation at the IV site. The CT tech and Dr. Tinnie Gens were notified of this possibility. Radiograph of the extremity of the IV is recommended to evaluate for extravasated contrast. RADIATION DOSE REDUCTION: This  exam was performed according to the departmental dose-optimization program which includes automated exposure control, adjustment of the mA and/or kV according to patient size and/or use of iterative reconstruction technique. CONTRAST:  58mL OMNIPAQUE IOHEXOL 300 MG/ML  SOLN COMPARISON:  December 13, 2014. FINDINGS: Lower chest: No acute abnormality. Hepatobiliary: No focal liver abnormality is seen. Status post cholecystectomy. No biliary dilatation. Pancreas: Unremarkable. No pancreatic ductal dilatation or surrounding inflammatory changes. Spleen: Normal in size without focal abnormality. Adrenals/Urinary Tract: Adrenal glands are unremarkable. Kidneys are normal, without renal calculi, focal lesion, or hydronephrosis. Bladder is unremarkable. Stomach/Bowel: The stomach appears normal. There is no evidence of bowel obstruction or inflammation. Status post appendectomy. Sigmoid diverticulosis without inflammation. Vascular/Lymphatic: Status post stent graft repair of infrarenal abdominal aortic aneurysm. Aneurysmal sac measures 6.6 cm in diameter. Due to the lack of intravenous contrast, patency of the graft cannot be evaluated and endoleak cannot be excluded. Reproductive: Status post hysterectomy. No adnexal masses. Other: No abdominal wall hernia or abnormality. No abdominopelvic ascites. Musculoskeletal: Status post right total hip arthroplasty. Status post surgical posterior fusion of L2-3, L3-4 and L4-5. No acute osseous abnormality is noted. IMPRESSION: As noted above, reportedly the patient was given intravenous contrast, but no contrast is seen on this exam. This is concerning for extravasation of contrast and Dr. Matilde Sprang was notified of this possibility. Radiograph of the extremity through which contrast was injected is recommended to evaluate for extravasation. Sigmoid diverticulosis without inflammation. Status post stent graft repair of infrarenal abdominal aortic aneurysm. Due to the lack of  intravenous contrast, patency of the stent graft cannot be evaluated. Also,  the possibility of endoleak cannot be excluded. Aneurysmal sac measures 6.6 cm in maximum AP diameter. No definite acute abnormality is noted in the abdomen or pelvis. Electronically Signed   By: Marijo Conception M.D.   On: 05/03/2021 19:06   DG Chest Port 1 View  Result Date: 05/05/2021 CLINICAL DATA:  Productive cough. EXAM: PORTABLE CHEST 1 VIEW COMPARISON:  December 20, 2018. FINDINGS: Stable cardiomegaly. Single lead left-sided pacemaker is noted. Minimal bibasilar subsegmental atelectasis or scar is noted. Bony thorax is unremarkable. IMPRESSION: Minimal bibasilar subsegmental atelectasis or scarring. Aortic Atherosclerosis (ICD10-I70.0). Electronically Signed   By: Marijo Conception M.D.   On: 05/05/2021 14:22   ECHOCARDIOGRAM COMPLETE  Result Date: 05/06/2021    ECHOCARDIOGRAM REPORT   Patient Name:   ERI MCEVERS Date of Exam: 05/06/2021 Medical Rec #:  [id number]       Height:       61.5 in Accession #:    [id number]      Weight:       122.6 lb Date of Birth:  05/05/1922       BSA:          1.543 m Patient Age:    83 years        BP:           159/106 mmHg Patient Gender: F               HR:           78 bpm. Exam Location:  Inpatient Procedure: 2D Echo, 3D Echo, Cardiac Doppler and Color Doppler Indications:    Acutre respiratory distress R06.03  History:        Patient has prior history of Echocardiogram examinations, most                 recent 12/21/2018. CAD, Pacemaker, TIA, Arrythmias:Atrial                 Fibrillation and tachybradycardia syndrome; Risk                 Factors:Hypertension and Dyslipidemia. COVID. AAA.  Sonographer:    Darlina Sicilian RDCS Referring Phys: [id number] [place]  1. Left ventricular ejection fraction, by estimation, is 40 to 45%. Left ventricular ejection fraction by 3D volume is 44 %. The left ventricle has mildly decreased function. The left ventricle has no regional wall  motion abnormalities. There is mild asymmetric left ventricular hypertrophy of the septal segment. Left ventricular diastolic parameters are consistent with Grade III diastolic dysfunction (restrictive).  2. Right ventricular systolic function is normal. The right ventricular size is normal. There is moderately elevated pulmonary artery systolic pressure.  3. Left atrial size was severely dilated.  4. Right atrial size was severely dilated.  5. The mitral valve is normal in structure. No evidence of mitral valve regurgitation. No evidence of mitral stenosis. Moderate mitral annular calcification.  6. The noncoronary cusp with focal calcification. The valve is tricuspid and opens well with no restrictive motion. Aortic valve regurgitation is mild. Aortic valve sclerosis/calcification is present, without any evidence of aortic stenosis.  7. The inferior vena cava is normal in size with greater than 50% respiratory variability, suggesting right atrial pressure of 3 mmHg. Comparison(s): A prior study was performed on 12/21/2018. Ef is depressed, was 50-55% now 40-45%. FINDINGS  Left Ventricle: Left ventricular ejection fraction, by estimation, is 40 to 45%. Left ventricular ejection fraction by 3D volume is 44 %. The left ventricle  has mildly decreased function. The left ventricle has no regional wall motion abnormalities. The  left ventricular internal cavity size was normal in size. There is mild asymmetric left ventricular hypertrophy of the septal segment. Left ventricular diastolic parameters are consistent with Grade III diastolic dysfunction (restrictive). Right Ventricle: The right ventricular size is normal. No increase in right ventricular wall thickness. Right ventricular systolic function is normal. There is moderately elevated pulmonary artery systolic pressure. The tricuspid regurgitant velocity is 2.95 m/s, and with an assumed right atrial pressure of 15 mmHg, the estimated right ventricular systolic  pressure is 32.9 mmHg. Left Atrium: Left atrial size was severely dilated. Right Atrium: Right atrial size was severely dilated. Pericardium: There is no evidence of pericardial effusion. Mitral Valve: The mitral valve is normal in structure. Moderate mitral annular calcification. No evidence of mitral valve regurgitation. No evidence of mitral valve stenosis. Tricuspid Valve: The tricuspid valve is normal in structure. Tricuspid valve regurgitation is mild . No evidence of tricuspid stenosis. Aortic Valve: The noncoronary cusp with focal calcification. The valve is tricuspid and opens well with no restrictive motion. The aortic valve is tricuspid. Aortic valve regurgitation is mild. Aortic valve sclerosis/calcification is present, without any  evidence of aortic stenosis. Pulmonic Valve: The pulmonic valve was normal in structure. Pulmonic valve regurgitation is not visualized. No evidence of pulmonic stenosis. Aorta: The aortic root is normal in size and structure. Venous: The inferior vena cava is normal in size with greater than 50% respiratory variability, suggesting right atrial pressure of 3 mmHg. IAS/Shunts: No atrial level shunt detected by color flow Doppler. Additional Comments: A device lead is visualized.  LEFT VENTRICLE PLAX 2D LVIDd:         3.80 cm         Diastology LVIDs:         2.50 cm         LV e' medial:    4.91 cm/s LV PW:         0.90 cm         LV E/e' medial:  17.6 LV IVS:        1.30 cm         LV e' lateral:   11.25 cm/s LVOT diam:     1.60 cm         LV E/e' lateral: 7.7 LV SV:         26 LV SV Index:   17 LVOT Area:     2.01 cm        3D Volume EF                                LV 3D EF:    Left                                             ventricul                                             ar  ejection                                             fraction                                             by 3D                                              volume is                                             44 %.                                 3D Volume EF:                                3D EF:        44 %                                LV EDV:       75 ml                                LV ESV:       42 ml                                LV SV:        33 ml RIGHT VENTRICLE RV S prime:     8.18 cm/s TAPSE (M-mode): 1.0 cm LEFT ATRIUM             Index        RIGHT ATRIUM           Index LA diam:        4.50 cm 2.92 cm/m   RA Area:     24.30 cm LA Vol (A2C):   77.9 ml 50.48 ml/m  RA Volume:   67.60 ml  43.80 ml/m LA Vol (A4C):   70.4 ml 45.62 ml/m LA Biplane Vol: 79.4 ml 51.45 ml/m  AORTIC VALVE LVOT Vmax:   74.40 cm/s LVOT Vmean:  54.000 cm/s LVOT VTI:    0.127 m  AORTA Ao Root diam: 3.10 cm Ao Asc diam:  3.50 cm MITRAL VALVE               TRICUSPID VALVE MV Area (PHT):  4.67 cm   TR Peak grad:   34.8 mmHg MV Area (plan): 5.02 cm   TR Vmax:        295.00 cm/s MV Decel Time:  163 msec MR Peak grad: 92.5 mmHg    SHUNTS MR Mean grad: 62.0 mmHg  Systemic VTI:  0.13 m MR Vmax:      481.00 cm/s  Systemic Diam: 1.60 cm MR Vmean:     381.0 cm/s MV E velocity: 86.55 cm/s Kardie Tobb DO Electronically signed by Berniece Salines DO Signature Date/Time: 05/06/2021/3:41:14 PM    Final    CT Angio Abd/Pel w/ and/or w/o  Result Date: 05/07/2021 CLINICAL DATA:  Aortic aneurysm (AAA), surveillance abdominal pain with AAA EXAM: CTA ABDOMEN AND PELVIS WITHOUT AND WITH CONTRAST TECHNIQUE: Multidetector CT imaging of the abdomen and pelvis was performed using the standard protocol during bolus administration of intravenous contrast. Multiplanar reconstructed images and MIPs were obtained and reviewed to evaluate the vascular anatomy. RADIATION DOSE REDUCTION: This exam was performed according to the departmental dose-optimization program which includes automated exposure control, adjustment of the mA and/or kV according to patient size and/or use of iterative reconstruction  technique. CONTRAST:  133mL OMNIPAQUE IOHEXOL 350 MG/ML SOLN COMPARISON:  CT abdomen pelvis 05/05/2021, CT angiography chest 12/20/2018, CT abdomen pelvis 05/03/2021, CT abdomen pelvis 12/13/2014 FINDINGS: Limited evaluation due to streak artifact originating from the lumbar spine surgical hardware and right hip surgical hardware. VASCULAR Aorta: Redemonstration of an infrarenal abdominal aorta aneurysm measuring up to 7.4 x 6.3 cm in caliber (approximately 7 cm in the craniocaudal dimension to the bifurcation) status post stent repair that extends from the origin of the renal arteries than to the distal common iliac arteries. The graft is noted to be patent. No endograft leak. No periaortic fat stranding. No draping over the lumbar spine. Celiac: Atherosclerotic plaque of the origin. Patent without evidence of aneurysm, dissection, vasculitis or significant stenosis. SMA: Atherosclerotic plaque of the origin. Patent without evidence of aneurysm, dissection, vasculitis or significant stenosis. Renals: Right renal artery origin stent patent. Severe stenosis of the origin of the left renal artery due to atherosclerotic plaque. IMA: Not visualized and likely chronically thrombosed. Inflow: Stable size aneurysmal left internal iliac aneurysm measuring up to 1.9 cm. Redemonstration of a right external iliac stent that is patent. Proximal Outflow: Bilateral common femoral and visualized portions of the superficial and profunda femoral arteries are patent without evidence of aneurysm, dissection, vasculitis or significant stenosis. Veins: The portal, splenic, superior mesenteric veins are patent. Review of the MIP images confirms the above findings. NON-VASCULAR Lower chest: Subsegmental atelectasis. Trace right pleural effusion. Bilateral lower lobe. Enlarged right atrium. Retrograde reflux of intravenous contrast within the inferior vena cava and hepatic veins. Hepatobiliary: No focal liver abnormality. No gallstones,  gallbladder wall thickening, or pericholecystic fluid. No biliary dilatation. Pancreas: Stable 1 cm pancreatic body cyst (5:63). Normal pancreatic contour. No surrounding inflammatory changes. No main pancreatic ductal dilatation. Spleen: Normal in size without focal abnormality. Adrenals/Urinary Tract: No adrenal nodule bilaterally. Bilateral kidneys enhance symmetrically. Left renal cortical scarring. No hydronephrosis. No hydroureter. The urinary bladder is unremarkable. Stomach/Bowel: Stomach is within normal limits. No evidence of bowel wall thickening or dilatation. Diffuse colonic diverticulosis. No pneumatosis. Status post appendectomy. Lymphatic: No lymphadenopathy. Reproductive: Status post hysterectomy. No adnexal masses. Other: No intraperitoneal free fluid. No intraperitoneal free gas. No organized fluid collection. Musculoskeletal: Right inguinal region 4.8 x 1.3 cm simple fluid density likely representing a liquefied hematoma or seroma status post femoral access. No suspicious lytic or blastic osseous lesions. Stable T11 compression fracture with 4 mm retropulsion into the central canal. Multilevel degenerative changes of the spine. Total right hip arthroplasty partially visualized. L2 through L5 posterolateral body fusion surgical hardware. IMPRESSION: VASCULAR 1. Stable infrarenal abdominal aorta  aneurysm (7.4 cm) status post stent repair. No endoleak. 2. 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 3. Patent aortic, right external iliac, right renal origin stents. 4.  Aortic Atherosclerosis (ICD10-I70.0). NON-VASCULAR 1. Cardiomegaly with right heart strain. 2. Trace right pleural effusion. 3. Status post cholecystectomy with associated intra and extrahepatic biliary ductal dilatation. This can be normal in the post cholecystectomy setting. Correlate clinically. 4. Colonic diverticulosis with no acute diverticulitis. 5. Stable 1 cm pancreatic body cyst. 6. Stable T11 compression fracture with  4 mm retropulsion into the central canal. 7. Right inguinal region 4.8 x 1.3 cm simple fluid density likely representing a liquefied hematoma or seroma status post femoral access. Electronically Signed   By: Iven Finn M.D.   On: 05/07/2021 23:45   CT Angio Abd/Pel W and/or Wo Contrast  Result Date: 05/03/2021 CLINICAL DATA:  Lower abdominal pain.  History of AAA repair. EXAM: CTA ABDOMEN AND PELVIS WITHOUT AND WITH CONTRAST TECHNIQUE: Multidetector CT imaging of the abdomen and pelvis was performed using the standard protocol during bolus administration of intravenous contrast. Multiplanar reconstructed images and MIPs were obtained and reviewed to evaluate the vascular anatomy. RADIATION DOSE REDUCTION: This exam was performed according to the departmental dose-optimization program which includes automated exposure control, adjustment of the mA and/or kV according to patient size and/or use of iterative reconstruction technique. CONTRAST:  122mL OMNIPAQUE IOHEXOL 350 MG/ML SOLN COMPARISON:  12/13/2014 CT abdomen pelvis FINDINGS: VASCULAR Aorta: Status post repair of infrarenal abdominal aortic aneurysm via a aortobiiliac stent. The aneurysm sac measures 6.4 x 6.2 cm, increased from 4.3 x 4.0 cm on 12/13/2014. there is no enhancement to suggest endoleak. Celiac: Patent without evidence of aneurysm, dissection, vasculitis or significant stenosis. SMA: Patent without evidence of aneurysm, dissection, vasculitis or significant stenosis. Renals: Patent proximal right renal stent. No high-grade stenosis or fibromuscular dysplasia. IMA: Occluded Inflow: Bilateral common iliac stents. 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 There is extensive in flow atherosclerosis without high-grade stenosis. Proximal Outflow: Bilateral common femoral and visualized portions of the superficial and profunda femoral arteries are patent without evidence of aneurysm,  dissection, vasculitis or significant stenosis. Veins: No obvious venous abnormality within the limitations of this arterial phase study. Review of the MIP images confirms the above findings. NON-VASCULAR Lower chest: No acute abnormality. Hepatobiliary: No focal liver abnormality is seen. Status post cholecystectomy. No biliary dilatation. Pancreas: Unremarkable. No pancreatic ductal dilatation or surrounding inflammatory changes. Spleen: Normal in size without focal abnormality. Adrenals/Urinary Tract: Adrenal glands are unremarkable. Kidneys are normal, without renal calculi, focal lesion, or hydronephrosis. Bladder is unremarkable. Stomach/Bowel: Stomach is within normal limits. Appendix not seen. No evidence of bowel wall thickening, distention, or inflammatory changes. Sigmoid diverticulosis. Lymphatic: No lymphadenopathy Reproductive: Status post hysterectomy Other: No abdominal wall hernia or abnormality. No abdominopelvic ascites. Musculoskeletal: Right hip arthroplasty. L2-5 posterior instrumented fusion. IMPRESSION: 1. Status post repair of infrarenal abdominal aortic aneurysm via an aortobiiliac stent. The aneurysm sac measures 6.4 x 6.2 cm, increased from 4.3 x 4.0 cm on 12/13/2014. No evidence of endoleak. 2. 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 3. No acute abnormality of the abdomen or pelvis. Aortic atherosclerosis (ICD10-I70.0). Electronically Signed   By: Ulyses Jarred M.D.   On: 05/03/2021 21:56    Microbiology: Results for orders placed or performed during the hospital encounter of 05/03/21  Resp Panel by RT-PCR (  Flu A&B, Covid) Nasopharyngeal Swab     Status: Abnormal   Collection Time: 05/03/21  3:03 PM   Specimen: Nasopharyngeal Swab; Nasopharyngeal(NP) swabs in vial transport medium  Result Value Ref Range Status   SARS Coronavirus 2 by RT PCR POSITIVE (A) NEGATIVE Final    Comment: (NOTE) SARS-CoV-2 target nucleic acids are  DETECTED.  The SARS-CoV-2 RNA is generally detectable in upper respiratory specimens during the acute phase of infection. Positive results are indicative of the presence of the identified virus, but do not rule out bacterial infection or co-infection with other pathogens not detected by the test. Clinical correlation with patient history and other diagnostic information is necessary to determine patient infection status. The expected result is Negative.  Fact Sheet for Patients: EntrepreneurPulse.com.au  Fact Sheet for Healthcare Providers: IncredibleEmployment.be  This test is not yet approved or cleared by the [place] FDA and  has been authorized for detection and/or diagnosis of SARS-CoV-2 by FDA under an Emergency Use Authorization (EUA).  This EUA will remain in effect (meaning this test can be used) for the duration of  the COVID-19 declaration under Section 564(b)(1) of the A ct, 21 U.S.C. section 360bbb-3(b)(1), unless the authorization is terminated or revoked sooner.     Influenza A by PCR NEGATIVE NEGATIVE Final   Influenza B by PCR NEGATIVE NEGATIVE Final    Comment: (NOTE) The Xpert Xpress SARS-CoV-2/FLU/RSV plus assay is intended as an aid in the diagnosis of influenza from Nasopharyngeal swab specimens and should not be used as a sole basis for treatment. Nasal washings and aspirates are unacceptable for Xpert Xpress SARS-CoV-2/FLU/RSV testing.  Fact Sheet for Patients: EntrepreneurPulse.com.au  Fact Sheet for Healthcare Providers: IncredibleEmployment.be  This test is not yet approved or cleared by the [place] FDA and has been authorized for detection and/or diagnosis of SARS-CoV-2 by FDA under an Emergency Use Authorization (EUA). This EUA will remain in effect (meaning this test can be used) for the duration of the COVID-19 declaration under Section 564(b)(1) of the Act, 21  U.S.C. section 360bbb-3(b)(1), unless the authorization is terminated or revoked.  Performed at [facility], [address], [place],  [address]     Labs: CBC: Recent Labs  Lab 05/03/21 1510 05/05/21 1339 05/06/21 0501 05/07/21 0154 05/08/21 0553  WBC 8.0 7.8 11.0* 10.0 9.0  NEUTROABS 6.3 5.7 9.6* 8.4* 7.5  HGB 11.8* 12.3 12.2 11.4* 12.7  HCT 36.7 37.8 36.5 34.2* 37.0  MCV 91.8 90.9 89.9 88.1 87.1  PLT 223 204 176 184 287   Basic Metabolic Panel: Recent Labs  Lab 05/03/21 1510 05/05/21 1326 05/06/21 0501 05/07/21 0154 05/08/21 0553  NA 132* 129* 131* 128* 130*  K 3.4* 3.4* 3.4* 3.4* 4.1  CL 100 93* 95* 95* 96*  CO2 27 26 27 23 23   GLUCOSE 113* 148* 173* 125* 141*  BUN 18 17 12 20 22   CREATININE 0.92 0.81 0.86 0.82 0.83  CALCIUM 8.9 8.6* 7.9* 8.2* 8.6*   Liver Function Tests: Recent Labs  Lab 05/03/21 1510 05/05/21 1326 05/06/21 0501 05/07/21 0154 05/08/21 0553  AST 18 36 40 40 56*  ALT 13 23 23 24  41  ALKPHOS 75 67 56 50 61  BILITOT 0.3 0.4 0.4 0.3 0.5  PROT 6.7 7.1 5.7* 5.5* 6.1*  ALBUMIN 3.6 3.7 2.8* 2.6* 3.0*   CBG: No results for input(s): GLUCAP in the last 168 hours.  Discharge time spent: greater than 30 minutes.  Signed: Patrecia Pour, MD [facility]  05/08/2021 °

## 2021-05-08 NOTE — Progress Notes (Signed)
Occupational Therapy Treatment Patient Details Name: Karina [name] MRN: [id number] DOB: 14-Aug-1922 Today's Date: 05/08/2021   History of present illness Patient is a 88 yo female presenting to the ED on 1/29 with low O2 saturations. Patient found to be Covid+ and pneumonia and endorsing nausea and vomiting. Patient previously presenting to ED on 1/27 with abdominal pain, where CT of abdomen pelvis showed abdominal aneurysm and iliac aneurysm had increased in size but showed no endovascular leak and ER physician discussed with vascular surgeon at [facility] advised follow-up as outpatient. PMH includes: CAD status post stenting, atrial fibrillation, tachybradycardia syndrome, complete heart block status post pacemaker, hypertension, abdominal aortic aneurysm status post endovascular repair.   OT comments  Patient continues to make steady progress towards goals in skilled OT session. Patient's session encompassed  self care bathing and dressing tasks to increase activity tolerance. Patient states, "I feel weak as water" but was up in chair, had ambulated with mobility prior and now on room air with good saturations. Patient able to complete 3 sit<>stands and maintain balance to attempt lower body bathing but needing min A to complete. Patient set up in sitting for remainder of ADLs. Son present for end of the session, and is setting up someone to stay with his mom during the night in order to make sure she has what she needs. Patient already has help in the mornings that comes to assist. Therapist in agreement of plan. Discharge remains appropriate, therapy will continue to follow.    Recommendations for follow up therapy are one component of a multi-disciplinary discharge planning process, led by the attending physician.  Recommendations may be updated based on patient status, additional functional criteria and insurance authorization.    Follow Up Recommendations  Home health OT     Assistance Recommended at Discharge Frequent or constant Supervision/Assistance  Patient can return home with the following  A little help with walking and/or transfers;A little help with bathing/dressing/bathroom;Assistance with cooking/housework;Assist for transportation   Equipment Recommendations  None recommended by OT (Patient has DME needed)    Recommendations for Other Services      Precautions / Restrictions Precautions Precautions: Fall Precaution Comments: Watch O2 Restrictions Weight Bearing Restrictions: No       Mobility Bed Mobility               General bed mobility comments: Up in chair upon arrival    Transfers Overall transfer level: Needs assistance Equipment used: Rolling walker (2 wheels) Transfers: Sit to/from Stand Sit to Stand: Min assist           General transfer comment: good hand placement with RW, min A due to unsteadiness     Balance                                           ADL either performed or assessed with clinical judgement   ADL Overall ADL's : Needs assistance/impaired     Grooming: Set up;Wash/dry hands;Wash/dry face;Oral care;Sitting   Upper Body Bathing: Set up;Sitting   Lower Body Bathing: Minimal assistance;Sit to/from stand Lower Body Bathing Details (indicate cue type and reason): Able to complete bathing with from sit<>stand with support Upper Body Dressing : Set up                   Functional mobility during ADLs: Minimal assistance;Rolling walker (2 wheels)  General ADL Comments: Patient engaging in self care tasks in order to increase activity tolerance for safe discharge home    Extremity/Trunk Assessment              Vision       Perception     Praxis      Cognition Arousal/Alertness: Awake/alert Behavior During Therapy: WFL for tasks assessed/performed Overall Cognitive Status: Within Functional Limits for tasks assessed                                           Exercises      Shoulder Instructions       General Comments      Pertinent Vitals/ Pain       Pain Assessment Pain Assessment: No/denies pain  Home Living                                          Prior Functioning/Environment              Frequency  Min 2X/week        Progress Toward Goals  OT Goals(current goals can now be found in the care plan section)  Progress towards OT goals: Progressing toward goals  Acute Rehab OT Goals Patient Stated Goal: To get stronger OT Goal Formulation: With patient Time For Goal Achievement: 05/20/21 Potential to Achieve Goals: Good  Plan Discharge plan remains appropriate    Co-evaluation                 AM-PAC OT "6 Clicks" Daily Activity     Outcome Measure   Help from another person eating meals?: None Help from another person taking care of personal grooming?: A Little Help from another person toileting, which includes using toliet, bedpan, or urinal?: A Little Help from another person bathing (including washing, rinsing, drying)?: A Little Help from another person to put on and taking off regular upper body clothing?: A Little Help from another person to put on and taking off regular lower body clothing?: A Little 6 Click Score: 19    End of Session Equipment Utilized During Treatment: Gait belt;Rolling walker (2 wheels)  OT Visit Diagnosis: Unsteadiness on feet (R26.81);Other abnormalities of gait and mobility (R26.89);Muscle weakness (generalized) (M62.81)   Activity Tolerance Patient tolerated treatment well   Patient Left in chair;with call bell/phone within reach;with family/visitor present   Nurse Communication Mobility status        Time: 9563-8756 OT Time Calculation (min): 32 min  Charges: OT General Charges $OT Visit: 1 Visit OT Treatments $Self Care/Home Management : 23-37 mins  [place]. Adewale Pucillo, OTR/L Acute Rehabilitation  Services [phone] [place] 05/08/2021, 12:41 PM

## 2021-05-08 NOTE — Progress Notes (Signed)
CT results in. MD aware. RN to hold PM dose of cardizem and metoprolol per MD.

## 2021-05-08 NOTE — Progress Notes (Addendum)
2120:  Pt called out reporting sudden SOB, this RN checked on pt and found pt to be in 9/10 abdominal pain, with SOB, and diaphoresis. Pt denied chest pain. Upon assessment of vitals, pt was found to be hypotensive (70s over 60s), and O2 sats in the high 90s on room air. Pt also reported sharp pain in her right shoulder as well as nausea. Blood pressure had been WNL upon assessment one hour prior. MD notified with immediate response to bedside. New orders for LR bolus, toradol, compazine, and CT abd.   2220:  Pt w/ continued nausea and vomiting x1, compazine had not been sent by pharmacy yet. Verbal order by Chotiner, MD for ativan 0.5mg  IV to be given to help with nausea. This RN drew ativan by overriding it in the pyxis. Pharmacy promptly sent up compazine and the compazine was given instead per Chotiner, MD. Ativan returned to pyxis by this RN.

## 2021-05-10 DIAGNOSIS — Z789 Other specified health status: Secondary | ICD-10-CM | POA: Diagnosis not present

## 2021-05-10 DIAGNOSIS — I509 Heart failure, unspecified: Secondary | ICD-10-CM | POA: Diagnosis not present

## 2021-05-10 DIAGNOSIS — Z6823 Body mass index (BMI) 23.0-23.9, adult: Secondary | ICD-10-CM | POA: Diagnosis not present

## 2021-05-10 DIAGNOSIS — N183 Chronic kidney disease, stage 3 unspecified: Secondary | ICD-10-CM | POA: Diagnosis not present

## 2021-05-10 DIAGNOSIS — I700 Atherosclerosis of aorta: Secondary | ICD-10-CM | POA: Diagnosis not present

## 2021-05-10 DIAGNOSIS — Z299 Encounter for prophylactic measures, unspecified: Secondary | ICD-10-CM | POA: Diagnosis not present

## 2021-05-10 DIAGNOSIS — I10 Essential (primary) hypertension: Secondary | ICD-10-CM | POA: Diagnosis not present

## 2021-05-12 DIAGNOSIS — J9601 Acute respiratory failure with hypoxia: Secondary | ICD-10-CM | POA: Diagnosis not present

## 2021-05-12 DIAGNOSIS — U071 COVID-19: Secondary | ICD-10-CM | POA: Diagnosis not present

## 2021-05-12 DIAGNOSIS — I119 Hypertensive heart disease without heart failure: Secondary | ICD-10-CM | POA: Diagnosis not present

## 2021-05-12 DIAGNOSIS — E785 Hyperlipidemia, unspecified: Secondary | ICD-10-CM | POA: Diagnosis not present

## 2021-05-12 DIAGNOSIS — K219 Gastro-esophageal reflux disease without esophagitis: Secondary | ICD-10-CM | POA: Diagnosis not present

## 2021-05-12 DIAGNOSIS — E1142 Type 2 diabetes mellitus with diabetic polyneuropathy: Secondary | ICD-10-CM | POA: Diagnosis not present

## 2021-05-12 DIAGNOSIS — Z8673 Personal history of transient ischemic attack (TIA), and cerebral infarction without residual deficits: Secondary | ICD-10-CM | POA: Diagnosis not present

## 2021-05-12 DIAGNOSIS — M519 Unspecified thoracic, thoracolumbar and lumbosacral intervertebral disc disorder: Secondary | ICD-10-CM | POA: Diagnosis not present

## 2021-05-12 DIAGNOSIS — M810 Age-related osteoporosis without current pathological fracture: Secondary | ICD-10-CM | POA: Diagnosis not present

## 2021-05-12 DIAGNOSIS — I773 Arterial fibromuscular dysplasia: Secondary | ICD-10-CM | POA: Diagnosis not present

## 2021-05-12 DIAGNOSIS — E46 Unspecified protein-calorie malnutrition: Secondary | ICD-10-CM | POA: Diagnosis not present

## 2021-05-12 DIAGNOSIS — Z950 Presence of cardiac pacemaker: Secondary | ICD-10-CM | POA: Diagnosis not present

## 2021-05-12 DIAGNOSIS — E876 Hypokalemia: Secondary | ICD-10-CM | POA: Diagnosis not present

## 2021-05-12 DIAGNOSIS — E1151 Type 2 diabetes mellitus with diabetic peripheral angiopathy without gangrene: Secondary | ICD-10-CM | POA: Diagnosis not present

## 2021-05-12 DIAGNOSIS — M1990 Unspecified osteoarthritis, unspecified site: Secondary | ICD-10-CM | POA: Diagnosis not present

## 2021-05-12 DIAGNOSIS — I495 Sick sinus syndrome: Secondary | ICD-10-CM | POA: Diagnosis not present

## 2021-05-12 DIAGNOSIS — I2510 Atherosclerotic heart disease of native coronary artery without angina pectoris: Secondary | ICD-10-CM | POA: Diagnosis not present

## 2021-05-12 DIAGNOSIS — I480 Paroxysmal atrial fibrillation: Secondary | ICD-10-CM | POA: Diagnosis not present

## 2021-05-12 DIAGNOSIS — D72829 Elevated white blood cell count, unspecified: Secondary | ICD-10-CM | POA: Diagnosis not present

## 2021-05-12 DIAGNOSIS — Z87440 Personal history of urinary (tract) infections: Secondary | ICD-10-CM | POA: Diagnosis not present

## 2021-05-12 DIAGNOSIS — Z7901 Long term (current) use of anticoagulants: Secondary | ICD-10-CM | POA: Diagnosis not present

## 2021-05-12 DIAGNOSIS — E871 Hypo-osmolality and hyponatremia: Secondary | ICD-10-CM | POA: Diagnosis not present

## 2021-05-12 DIAGNOSIS — E8809 Other disorders of plasma-protein metabolism, not elsewhere classified: Secondary | ICD-10-CM | POA: Diagnosis not present

## 2021-05-13 ENCOUNTER — Ambulatory Visit (INDEPENDENT_AMBULATORY_CARE_PROVIDER_SITE_OTHER): Payer: Medicare Other

## 2021-05-13 DIAGNOSIS — I495 Sick sinus syndrome: Secondary | ICD-10-CM | POA: Diagnosis not present

## 2021-05-13 LAB — CUP PACEART REMOTE DEVICE CHECK
Date Time Interrogation Session: 20230205070424
Implantable Lead Implant Date: 20150413
Implantable Lead Location: 753860
Implantable Lead Serial Number: 29601447
Implantable Pulse Generator Implant Date: 20150413
Pulse Gen Serial Number: 68133668

## 2021-05-15 ENCOUNTER — Telehealth: Payer: Self-pay

## 2021-05-15 NOTE — Telephone Encounter (Signed)
BPA triggered for worsening symptoms SOB, weakness, cough. Patient called and given permission to her friend Vivien Rota. Advised the reason for the call. She says her son filled this out. She says the patient's is SOB, but the cough is the worst. Advised to follow up with PCP, she says the appointment was on 05/10/21. I advised to call the PCP today to let him know about the worsening cough. Advised as below:   Shortness of breath is the same:   continue to monitor at home  If symptoms become severe, i.e. shortness of breath at rest, gasping for air, wheezing, call 911 and seek treatment in the ED  If cough remains the same or better: continue to treat with over the counter medications.  Hard candy or cough drops and drinking warm fluids. Adults can also use honey 2 tsp (10 ML) at bedtime.   If cough is becoming worse even with the use of over the counter medications and patient is not able to sleep at night, cough becomes productive with sputum that maybe yellow or green in color, contact PCP.  If patient has worsening weakness with inability to stand or if patient has to hold on to something to get balance, advise patient to call 911 and seek treatment in ED

## 2021-05-16 DIAGNOSIS — I480 Paroxysmal atrial fibrillation: Secondary | ICD-10-CM | POA: Diagnosis not present

## 2021-05-16 DIAGNOSIS — I2510 Atherosclerotic heart disease of native coronary artery without angina pectoris: Secondary | ICD-10-CM | POA: Diagnosis not present

## 2021-05-16 DIAGNOSIS — I5022 Chronic systolic (congestive) heart failure: Secondary | ICD-10-CM | POA: Diagnosis not present

## 2021-05-16 DIAGNOSIS — Z7901 Long term (current) use of anticoagulants: Secondary | ICD-10-CM | POA: Diagnosis not present

## 2021-05-16 DIAGNOSIS — E1151 Type 2 diabetes mellitus with diabetic peripheral angiopathy without gangrene: Secondary | ICD-10-CM | POA: Diagnosis not present

## 2021-05-16 DIAGNOSIS — K219 Gastro-esophageal reflux disease without esophagitis: Secondary | ICD-10-CM | POA: Diagnosis not present

## 2021-05-16 DIAGNOSIS — E8809 Other disorders of plasma-protein metabolism, not elsewhere classified: Secondary | ICD-10-CM | POA: Diagnosis not present

## 2021-05-16 DIAGNOSIS — I119 Hypertensive heart disease without heart failure: Secondary | ICD-10-CM | POA: Diagnosis not present

## 2021-05-16 DIAGNOSIS — E876 Hypokalemia: Secondary | ICD-10-CM | POA: Diagnosis not present

## 2021-05-16 DIAGNOSIS — E871 Hypo-osmolality and hyponatremia: Secondary | ICD-10-CM | POA: Diagnosis not present

## 2021-05-16 DIAGNOSIS — U071 COVID-19: Secondary | ICD-10-CM | POA: Diagnosis not present

## 2021-05-16 DIAGNOSIS — Z950 Presence of cardiac pacemaker: Secondary | ICD-10-CM | POA: Diagnosis not present

## 2021-05-16 DIAGNOSIS — J9601 Acute respiratory failure with hypoxia: Secondary | ICD-10-CM | POA: Diagnosis not present

## 2021-05-16 DIAGNOSIS — B9729 Other coronavirus as the cause of diseases classified elsewhere: Secondary | ICD-10-CM | POA: Diagnosis not present

## 2021-05-16 DIAGNOSIS — I773 Arterial fibromuscular dysplasia: Secondary | ICD-10-CM | POA: Diagnosis not present

## 2021-05-16 DIAGNOSIS — D72829 Elevated white blood cell count, unspecified: Secondary | ICD-10-CM | POA: Diagnosis not present

## 2021-05-16 DIAGNOSIS — M1990 Unspecified osteoarthritis, unspecified site: Secondary | ICD-10-CM | POA: Diagnosis not present

## 2021-05-16 DIAGNOSIS — M519 Unspecified thoracic, thoracolumbar and lumbosacral intervertebral disc disorder: Secondary | ICD-10-CM | POA: Diagnosis not present

## 2021-05-16 DIAGNOSIS — Z87440 Personal history of urinary (tract) infections: Secondary | ICD-10-CM | POA: Diagnosis not present

## 2021-05-16 DIAGNOSIS — E46 Unspecified protein-calorie malnutrition: Secondary | ICD-10-CM | POA: Diagnosis not present

## 2021-05-16 DIAGNOSIS — M810 Age-related osteoporosis without current pathological fracture: Secondary | ICD-10-CM | POA: Diagnosis not present

## 2021-05-16 DIAGNOSIS — I495 Sick sinus syndrome: Secondary | ICD-10-CM | POA: Diagnosis not present

## 2021-05-16 DIAGNOSIS — E785 Hyperlipidemia, unspecified: Secondary | ICD-10-CM | POA: Diagnosis not present

## 2021-05-16 DIAGNOSIS — E1142 Type 2 diabetes mellitus with diabetic polyneuropathy: Secondary | ICD-10-CM | POA: Diagnosis not present

## 2021-05-16 DIAGNOSIS — Z8673 Personal history of transient ischemic attack (TIA), and cerebral infarction without residual deficits: Secondary | ICD-10-CM | POA: Diagnosis not present

## 2021-05-16 NOTE — Progress Notes (Signed)
Remote pacemaker transmission.   

## 2021-05-17 DIAGNOSIS — I495 Sick sinus syndrome: Secondary | ICD-10-CM | POA: Diagnosis not present

## 2021-05-17 DIAGNOSIS — I2510 Atherosclerotic heart disease of native coronary artery without angina pectoris: Secondary | ICD-10-CM | POA: Diagnosis not present

## 2021-05-17 DIAGNOSIS — I480 Paroxysmal atrial fibrillation: Secondary | ICD-10-CM | POA: Diagnosis not present

## 2021-05-17 DIAGNOSIS — I119 Hypertensive heart disease without heart failure: Secondary | ICD-10-CM | POA: Diagnosis not present

## 2021-05-20 DIAGNOSIS — E876 Hypokalemia: Secondary | ICD-10-CM | POA: Diagnosis not present

## 2021-05-20 DIAGNOSIS — Z87440 Personal history of urinary (tract) infections: Secondary | ICD-10-CM | POA: Diagnosis not present

## 2021-05-20 DIAGNOSIS — E1142 Type 2 diabetes mellitus with diabetic polyneuropathy: Secondary | ICD-10-CM | POA: Diagnosis not present

## 2021-05-20 DIAGNOSIS — I2510 Atherosclerotic heart disease of native coronary artery without angina pectoris: Secondary | ICD-10-CM | POA: Diagnosis not present

## 2021-05-20 DIAGNOSIS — Z7901 Long term (current) use of anticoagulants: Secondary | ICD-10-CM | POA: Diagnosis not present

## 2021-05-20 DIAGNOSIS — M1990 Unspecified osteoarthritis, unspecified site: Secondary | ICD-10-CM | POA: Diagnosis not present

## 2021-05-20 DIAGNOSIS — U071 COVID-19: Secondary | ICD-10-CM | POA: Diagnosis not present

## 2021-05-20 DIAGNOSIS — E785 Hyperlipidemia, unspecified: Secondary | ICD-10-CM | POA: Diagnosis not present

## 2021-05-20 DIAGNOSIS — M810 Age-related osteoporosis without current pathological fracture: Secondary | ICD-10-CM | POA: Diagnosis not present

## 2021-05-20 DIAGNOSIS — I773 Arterial fibromuscular dysplasia: Secondary | ICD-10-CM | POA: Diagnosis not present

## 2021-05-20 DIAGNOSIS — M519 Unspecified thoracic, thoracolumbar and lumbosacral intervertebral disc disorder: Secondary | ICD-10-CM | POA: Diagnosis not present

## 2021-05-20 DIAGNOSIS — Z950 Presence of cardiac pacemaker: Secondary | ICD-10-CM | POA: Diagnosis not present

## 2021-05-20 DIAGNOSIS — I480 Paroxysmal atrial fibrillation: Secondary | ICD-10-CM | POA: Diagnosis not present

## 2021-05-20 DIAGNOSIS — E46 Unspecified protein-calorie malnutrition: Secondary | ICD-10-CM | POA: Diagnosis not present

## 2021-05-20 DIAGNOSIS — E8809 Other disorders of plasma-protein metabolism, not elsewhere classified: Secondary | ICD-10-CM | POA: Diagnosis not present

## 2021-05-20 DIAGNOSIS — J9601 Acute respiratory failure with hypoxia: Secondary | ICD-10-CM | POA: Diagnosis not present

## 2021-05-20 DIAGNOSIS — K219 Gastro-esophageal reflux disease without esophagitis: Secondary | ICD-10-CM | POA: Diagnosis not present

## 2021-05-20 DIAGNOSIS — I495 Sick sinus syndrome: Secondary | ICD-10-CM | POA: Diagnosis not present

## 2021-05-20 DIAGNOSIS — I119 Hypertensive heart disease without heart failure: Secondary | ICD-10-CM | POA: Diagnosis not present

## 2021-05-20 DIAGNOSIS — E871 Hypo-osmolality and hyponatremia: Secondary | ICD-10-CM | POA: Diagnosis not present

## 2021-05-20 DIAGNOSIS — D72829 Elevated white blood cell count, unspecified: Secondary | ICD-10-CM | POA: Diagnosis not present

## 2021-05-20 DIAGNOSIS — Z8673 Personal history of transient ischemic attack (TIA), and cerebral infarction without residual deficits: Secondary | ICD-10-CM | POA: Diagnosis not present

## 2021-05-20 DIAGNOSIS — E1151 Type 2 diabetes mellitus with diabetic peripheral angiopathy without gangrene: Secondary | ICD-10-CM | POA: Diagnosis not present

## 2021-05-23 DIAGNOSIS — E1151 Type 2 diabetes mellitus with diabetic peripheral angiopathy without gangrene: Secondary | ICD-10-CM | POA: Diagnosis not present

## 2021-05-23 DIAGNOSIS — D72829 Elevated white blood cell count, unspecified: Secondary | ICD-10-CM | POA: Diagnosis not present

## 2021-05-23 DIAGNOSIS — E1142 Type 2 diabetes mellitus with diabetic polyneuropathy: Secondary | ICD-10-CM | POA: Diagnosis not present

## 2021-05-23 DIAGNOSIS — E876 Hypokalemia: Secondary | ICD-10-CM | POA: Diagnosis not present

## 2021-05-23 DIAGNOSIS — I2510 Atherosclerotic heart disease of native coronary artery without angina pectoris: Secondary | ICD-10-CM | POA: Diagnosis not present

## 2021-05-23 DIAGNOSIS — I495 Sick sinus syndrome: Secondary | ICD-10-CM | POA: Diagnosis not present

## 2021-05-23 DIAGNOSIS — I480 Paroxysmal atrial fibrillation: Secondary | ICD-10-CM | POA: Diagnosis not present

## 2021-05-23 DIAGNOSIS — E8809 Other disorders of plasma-protein metabolism, not elsewhere classified: Secondary | ICD-10-CM | POA: Diagnosis not present

## 2021-05-23 DIAGNOSIS — E785 Hyperlipidemia, unspecified: Secondary | ICD-10-CM | POA: Diagnosis not present

## 2021-05-23 DIAGNOSIS — M519 Unspecified thoracic, thoracolumbar and lumbosacral intervertebral disc disorder: Secondary | ICD-10-CM | POA: Diagnosis not present

## 2021-05-23 DIAGNOSIS — Z7901 Long term (current) use of anticoagulants: Secondary | ICD-10-CM | POA: Diagnosis not present

## 2021-05-23 DIAGNOSIS — Z87440 Personal history of urinary (tract) infections: Secondary | ICD-10-CM | POA: Diagnosis not present

## 2021-05-23 DIAGNOSIS — Z950 Presence of cardiac pacemaker: Secondary | ICD-10-CM | POA: Diagnosis not present

## 2021-05-23 DIAGNOSIS — J9601 Acute respiratory failure with hypoxia: Secondary | ICD-10-CM | POA: Diagnosis not present

## 2021-05-23 DIAGNOSIS — I119 Hypertensive heart disease without heart failure: Secondary | ICD-10-CM | POA: Diagnosis not present

## 2021-05-23 DIAGNOSIS — E871 Hypo-osmolality and hyponatremia: Secondary | ICD-10-CM | POA: Diagnosis not present

## 2021-05-23 DIAGNOSIS — Z8673 Personal history of transient ischemic attack (TIA), and cerebral infarction without residual deficits: Secondary | ICD-10-CM | POA: Diagnosis not present

## 2021-05-23 DIAGNOSIS — M810 Age-related osteoporosis without current pathological fracture: Secondary | ICD-10-CM | POA: Diagnosis not present

## 2021-05-23 DIAGNOSIS — K219 Gastro-esophageal reflux disease without esophagitis: Secondary | ICD-10-CM | POA: Diagnosis not present

## 2021-05-23 DIAGNOSIS — M1990 Unspecified osteoarthritis, unspecified site: Secondary | ICD-10-CM | POA: Diagnosis not present

## 2021-05-23 DIAGNOSIS — U071 COVID-19: Secondary | ICD-10-CM | POA: Diagnosis not present

## 2021-05-23 DIAGNOSIS — E46 Unspecified protein-calorie malnutrition: Secondary | ICD-10-CM | POA: Diagnosis not present

## 2021-05-23 DIAGNOSIS — I773 Arterial fibromuscular dysplasia: Secondary | ICD-10-CM | POA: Diagnosis not present

## 2021-05-24 DIAGNOSIS — E1151 Type 2 diabetes mellitus with diabetic peripheral angiopathy without gangrene: Secondary | ICD-10-CM | POA: Diagnosis not present

## 2021-05-24 DIAGNOSIS — U071 COVID-19: Secondary | ICD-10-CM | POA: Diagnosis not present

## 2021-05-24 DIAGNOSIS — Z8673 Personal history of transient ischemic attack (TIA), and cerebral infarction without residual deficits: Secondary | ICD-10-CM | POA: Diagnosis not present

## 2021-05-24 DIAGNOSIS — E1142 Type 2 diabetes mellitus with diabetic polyneuropathy: Secondary | ICD-10-CM | POA: Diagnosis not present

## 2021-05-24 DIAGNOSIS — Z950 Presence of cardiac pacemaker: Secondary | ICD-10-CM | POA: Diagnosis not present

## 2021-05-24 DIAGNOSIS — E8809 Other disorders of plasma-protein metabolism, not elsewhere classified: Secondary | ICD-10-CM | POA: Diagnosis not present

## 2021-05-24 DIAGNOSIS — I480 Paroxysmal atrial fibrillation: Secondary | ICD-10-CM | POA: Diagnosis not present

## 2021-05-24 DIAGNOSIS — I495 Sick sinus syndrome: Secondary | ICD-10-CM | POA: Diagnosis not present

## 2021-05-24 DIAGNOSIS — E871 Hypo-osmolality and hyponatremia: Secondary | ICD-10-CM | POA: Diagnosis not present

## 2021-05-24 DIAGNOSIS — E785 Hyperlipidemia, unspecified: Secondary | ICD-10-CM | POA: Diagnosis not present

## 2021-05-24 DIAGNOSIS — Z87440 Personal history of urinary (tract) infections: Secondary | ICD-10-CM | POA: Diagnosis not present

## 2021-05-24 DIAGNOSIS — M810 Age-related osteoporosis without current pathological fracture: Secondary | ICD-10-CM | POA: Diagnosis not present

## 2021-05-24 DIAGNOSIS — J9601 Acute respiratory failure with hypoxia: Secondary | ICD-10-CM | POA: Diagnosis not present

## 2021-05-24 DIAGNOSIS — E46 Unspecified protein-calorie malnutrition: Secondary | ICD-10-CM | POA: Diagnosis not present

## 2021-05-24 DIAGNOSIS — E876 Hypokalemia: Secondary | ICD-10-CM | POA: Diagnosis not present

## 2021-05-24 DIAGNOSIS — I773 Arterial fibromuscular dysplasia: Secondary | ICD-10-CM | POA: Diagnosis not present

## 2021-05-24 DIAGNOSIS — I119 Hypertensive heart disease without heart failure: Secondary | ICD-10-CM | POA: Diagnosis not present

## 2021-05-24 DIAGNOSIS — M519 Unspecified thoracic, thoracolumbar and lumbosacral intervertebral disc disorder: Secondary | ICD-10-CM | POA: Diagnosis not present

## 2021-05-24 DIAGNOSIS — Z7901 Long term (current) use of anticoagulants: Secondary | ICD-10-CM | POA: Diagnosis not present

## 2021-05-24 DIAGNOSIS — M1990 Unspecified osteoarthritis, unspecified site: Secondary | ICD-10-CM | POA: Diagnosis not present

## 2021-05-24 DIAGNOSIS — D72829 Elevated white blood cell count, unspecified: Secondary | ICD-10-CM | POA: Diagnosis not present

## 2021-05-24 DIAGNOSIS — I2510 Atherosclerotic heart disease of native coronary artery without angina pectoris: Secondary | ICD-10-CM | POA: Diagnosis not present

## 2021-05-24 DIAGNOSIS — K219 Gastro-esophageal reflux disease without esophagitis: Secondary | ICD-10-CM | POA: Diagnosis not present

## 2021-05-27 DIAGNOSIS — M1990 Unspecified osteoarthritis, unspecified site: Secondary | ICD-10-CM | POA: Diagnosis not present

## 2021-05-27 DIAGNOSIS — E785 Hyperlipidemia, unspecified: Secondary | ICD-10-CM | POA: Diagnosis not present

## 2021-05-27 DIAGNOSIS — U071 COVID-19: Secondary | ICD-10-CM | POA: Diagnosis not present

## 2021-05-27 DIAGNOSIS — Z87440 Personal history of urinary (tract) infections: Secondary | ICD-10-CM | POA: Diagnosis not present

## 2021-05-27 DIAGNOSIS — Z950 Presence of cardiac pacemaker: Secondary | ICD-10-CM | POA: Diagnosis not present

## 2021-05-27 DIAGNOSIS — I495 Sick sinus syndrome: Secondary | ICD-10-CM | POA: Diagnosis not present

## 2021-05-27 DIAGNOSIS — E871 Hypo-osmolality and hyponatremia: Secondary | ICD-10-CM | POA: Diagnosis not present

## 2021-05-27 DIAGNOSIS — E876 Hypokalemia: Secondary | ICD-10-CM | POA: Diagnosis not present

## 2021-05-27 DIAGNOSIS — Z8673 Personal history of transient ischemic attack (TIA), and cerebral infarction without residual deficits: Secondary | ICD-10-CM | POA: Diagnosis not present

## 2021-05-27 DIAGNOSIS — E1151 Type 2 diabetes mellitus with diabetic peripheral angiopathy without gangrene: Secondary | ICD-10-CM | POA: Diagnosis not present

## 2021-05-27 DIAGNOSIS — E8809 Other disorders of plasma-protein metabolism, not elsewhere classified: Secondary | ICD-10-CM | POA: Diagnosis not present

## 2021-05-27 DIAGNOSIS — I119 Hypertensive heart disease without heart failure: Secondary | ICD-10-CM | POA: Diagnosis not present

## 2021-05-27 DIAGNOSIS — M519 Unspecified thoracic, thoracolumbar and lumbosacral intervertebral disc disorder: Secondary | ICD-10-CM | POA: Diagnosis not present

## 2021-05-27 DIAGNOSIS — Z7901 Long term (current) use of anticoagulants: Secondary | ICD-10-CM | POA: Diagnosis not present

## 2021-05-27 DIAGNOSIS — D72829 Elevated white blood cell count, unspecified: Secondary | ICD-10-CM | POA: Diagnosis not present

## 2021-05-27 DIAGNOSIS — E46 Unspecified protein-calorie malnutrition: Secondary | ICD-10-CM | POA: Diagnosis not present

## 2021-05-27 DIAGNOSIS — I2510 Atherosclerotic heart disease of native coronary artery without angina pectoris: Secondary | ICD-10-CM | POA: Diagnosis not present

## 2021-05-27 DIAGNOSIS — M810 Age-related osteoporosis without current pathological fracture: Secondary | ICD-10-CM | POA: Diagnosis not present

## 2021-05-27 DIAGNOSIS — I480 Paroxysmal atrial fibrillation: Secondary | ICD-10-CM | POA: Diagnosis not present

## 2021-05-27 DIAGNOSIS — K219 Gastro-esophageal reflux disease without esophagitis: Secondary | ICD-10-CM | POA: Diagnosis not present

## 2021-05-27 DIAGNOSIS — I773 Arterial fibromuscular dysplasia: Secondary | ICD-10-CM | POA: Diagnosis not present

## 2021-05-27 DIAGNOSIS — J9601 Acute respiratory failure with hypoxia: Secondary | ICD-10-CM | POA: Diagnosis not present

## 2021-05-27 DIAGNOSIS — E1142 Type 2 diabetes mellitus with diabetic polyneuropathy: Secondary | ICD-10-CM | POA: Diagnosis not present

## 2021-05-29 DIAGNOSIS — E785 Hyperlipidemia, unspecified: Secondary | ICD-10-CM | POA: Diagnosis not present

## 2021-05-29 DIAGNOSIS — U071 COVID-19: Secondary | ICD-10-CM | POA: Diagnosis not present

## 2021-05-29 DIAGNOSIS — Z950 Presence of cardiac pacemaker: Secondary | ICD-10-CM | POA: Diagnosis not present

## 2021-05-29 DIAGNOSIS — M1990 Unspecified osteoarthritis, unspecified site: Secondary | ICD-10-CM | POA: Diagnosis not present

## 2021-05-29 DIAGNOSIS — I495 Sick sinus syndrome: Secondary | ICD-10-CM | POA: Diagnosis not present

## 2021-05-29 DIAGNOSIS — I480 Paroxysmal atrial fibrillation: Secondary | ICD-10-CM | POA: Diagnosis not present

## 2021-05-29 DIAGNOSIS — D72829 Elevated white blood cell count, unspecified: Secondary | ICD-10-CM | POA: Diagnosis not present

## 2021-05-29 DIAGNOSIS — E876 Hypokalemia: Secondary | ICD-10-CM | POA: Diagnosis not present

## 2021-05-29 DIAGNOSIS — I119 Hypertensive heart disease without heart failure: Secondary | ICD-10-CM | POA: Diagnosis not present

## 2021-05-29 DIAGNOSIS — E871 Hypo-osmolality and hyponatremia: Secondary | ICD-10-CM | POA: Diagnosis not present

## 2021-05-29 DIAGNOSIS — Z8673 Personal history of transient ischemic attack (TIA), and cerebral infarction without residual deficits: Secondary | ICD-10-CM | POA: Diagnosis not present

## 2021-05-29 DIAGNOSIS — Z87440 Personal history of urinary (tract) infections: Secondary | ICD-10-CM | POA: Diagnosis not present

## 2021-05-29 DIAGNOSIS — E1151 Type 2 diabetes mellitus with diabetic peripheral angiopathy without gangrene: Secondary | ICD-10-CM | POA: Diagnosis not present

## 2021-05-29 DIAGNOSIS — J9601 Acute respiratory failure with hypoxia: Secondary | ICD-10-CM | POA: Diagnosis not present

## 2021-05-29 DIAGNOSIS — E8809 Other disorders of plasma-protein metabolism, not elsewhere classified: Secondary | ICD-10-CM | POA: Diagnosis not present

## 2021-05-29 DIAGNOSIS — I2510 Atherosclerotic heart disease of native coronary artery without angina pectoris: Secondary | ICD-10-CM | POA: Diagnosis not present

## 2021-05-29 DIAGNOSIS — E1142 Type 2 diabetes mellitus with diabetic polyneuropathy: Secondary | ICD-10-CM | POA: Diagnosis not present

## 2021-05-29 DIAGNOSIS — Z7901 Long term (current) use of anticoagulants: Secondary | ICD-10-CM | POA: Diagnosis not present

## 2021-05-29 DIAGNOSIS — M519 Unspecified thoracic, thoracolumbar and lumbosacral intervertebral disc disorder: Secondary | ICD-10-CM | POA: Diagnosis not present

## 2021-05-29 DIAGNOSIS — E46 Unspecified protein-calorie malnutrition: Secondary | ICD-10-CM | POA: Diagnosis not present

## 2021-05-29 DIAGNOSIS — I773 Arterial fibromuscular dysplasia: Secondary | ICD-10-CM | POA: Diagnosis not present

## 2021-05-29 DIAGNOSIS — M810 Age-related osteoporosis without current pathological fracture: Secondary | ICD-10-CM | POA: Diagnosis not present

## 2021-05-29 DIAGNOSIS — K219 Gastro-esophageal reflux disease without esophagitis: Secondary | ICD-10-CM | POA: Diagnosis not present

## 2021-05-31 DIAGNOSIS — H903 Sensorineural hearing loss, bilateral: Secondary | ICD-10-CM | POA: Diagnosis not present

## 2021-06-03 DIAGNOSIS — Z87440 Personal history of urinary (tract) infections: Secondary | ICD-10-CM | POA: Diagnosis not present

## 2021-06-03 DIAGNOSIS — K219 Gastro-esophageal reflux disease without esophagitis: Secondary | ICD-10-CM | POA: Diagnosis not present

## 2021-06-03 DIAGNOSIS — M519 Unspecified thoracic, thoracolumbar and lumbosacral intervertebral disc disorder: Secondary | ICD-10-CM | POA: Diagnosis not present

## 2021-06-03 DIAGNOSIS — E871 Hypo-osmolality and hyponatremia: Secondary | ICD-10-CM | POA: Diagnosis not present

## 2021-06-03 DIAGNOSIS — E8809 Other disorders of plasma-protein metabolism, not elsewhere classified: Secondary | ICD-10-CM | POA: Diagnosis not present

## 2021-06-03 DIAGNOSIS — I773 Arterial fibromuscular dysplasia: Secondary | ICD-10-CM | POA: Diagnosis not present

## 2021-06-03 DIAGNOSIS — J9601 Acute respiratory failure with hypoxia: Secondary | ICD-10-CM | POA: Diagnosis not present

## 2021-06-03 DIAGNOSIS — M810 Age-related osteoporosis without current pathological fracture: Secondary | ICD-10-CM | POA: Diagnosis not present

## 2021-06-03 DIAGNOSIS — E1151 Type 2 diabetes mellitus with diabetic peripheral angiopathy without gangrene: Secondary | ICD-10-CM | POA: Diagnosis not present

## 2021-06-03 DIAGNOSIS — Z7901 Long term (current) use of anticoagulants: Secondary | ICD-10-CM | POA: Diagnosis not present

## 2021-06-03 DIAGNOSIS — I119 Hypertensive heart disease without heart failure: Secondary | ICD-10-CM | POA: Diagnosis not present

## 2021-06-03 DIAGNOSIS — Z950 Presence of cardiac pacemaker: Secondary | ICD-10-CM | POA: Diagnosis not present

## 2021-06-03 DIAGNOSIS — E46 Unspecified protein-calorie malnutrition: Secondary | ICD-10-CM | POA: Diagnosis not present

## 2021-06-03 DIAGNOSIS — Z8673 Personal history of transient ischemic attack (TIA), and cerebral infarction without residual deficits: Secondary | ICD-10-CM | POA: Diagnosis not present

## 2021-06-03 DIAGNOSIS — D72829 Elevated white blood cell count, unspecified: Secondary | ICD-10-CM | POA: Diagnosis not present

## 2021-06-03 DIAGNOSIS — I2510 Atherosclerotic heart disease of native coronary artery without angina pectoris: Secondary | ICD-10-CM | POA: Diagnosis not present

## 2021-06-03 DIAGNOSIS — E876 Hypokalemia: Secondary | ICD-10-CM | POA: Diagnosis not present

## 2021-06-03 DIAGNOSIS — I495 Sick sinus syndrome: Secondary | ICD-10-CM | POA: Diagnosis not present

## 2021-06-03 DIAGNOSIS — I480 Paroxysmal atrial fibrillation: Secondary | ICD-10-CM | POA: Diagnosis not present

## 2021-06-03 DIAGNOSIS — E785 Hyperlipidemia, unspecified: Secondary | ICD-10-CM | POA: Diagnosis not present

## 2021-06-03 DIAGNOSIS — E1142 Type 2 diabetes mellitus with diabetic polyneuropathy: Secondary | ICD-10-CM | POA: Diagnosis not present

## 2021-06-03 DIAGNOSIS — M1990 Unspecified osteoarthritis, unspecified site: Secondary | ICD-10-CM | POA: Diagnosis not present

## 2021-06-03 DIAGNOSIS — U071 COVID-19: Secondary | ICD-10-CM | POA: Diagnosis not present

## 2021-06-04 ENCOUNTER — Ambulatory Visit (INDEPENDENT_AMBULATORY_CARE_PROVIDER_SITE_OTHER): Payer: Medicare Other | Admitting: Internal Medicine

## 2021-06-04 ENCOUNTER — Encounter: Payer: Self-pay | Admitting: Internal Medicine

## 2021-06-04 ENCOUNTER — Other Ambulatory Visit: Payer: Self-pay

## 2021-06-04 VITALS — BP 124/82 | HR 76 | Ht 62.0 in | Wt 122.0 lb

## 2021-06-04 DIAGNOSIS — I495 Sick sinus syndrome: Secondary | ICD-10-CM | POA: Diagnosis not present

## 2021-06-04 DIAGNOSIS — E7800 Pure hypercholesterolemia, unspecified: Secondary | ICD-10-CM | POA: Diagnosis not present

## 2021-06-04 DIAGNOSIS — I10 Essential (primary) hypertension: Secondary | ICD-10-CM | POA: Diagnosis not present

## 2021-06-04 NOTE — Progress Notes (Signed)
HPI Karina [name] returns today for followup. She is a pleasant 88 yo woman with a h/o chronic atrial fib, CAD s/p stenting of the LAD remotely. She is now on a strategy of rate control. She was in the hospital with generalized weakness of no obvious etiology. The patient is still trying to walk using a walker. No edema. No syncope. No anginal symptoms.  She had Covid last month and pneumonia and is much improved. She c/o difficulty hearing. She has been to an audiologist at [facility].  Allergies  Allergen Reactions   Ace Inhibitors Hives   Amiodarone Other (See Comments)    Worsening peripheral neuropathy   Tikosyn [Dofetilide] Other (See Comments)    QT prolongation   Meperidine And Related Other (See Comments)    Unknown reaction     Current Outpatient Medications  Medication Sig Dispense Refill   apixaban (ELIQUIS) 2.5 MG TABS tablet Take 1 tablet (2.5 mg total) 2 (two) times daily by mouth. 60 tablet 1   Ascorbic Acid (VITAMIN C PO) Take 1 tablet by mouth every morning.     cefdinir (OMNICEF) 300 MG capsule Take 1 capsule (300 mg total) by mouth 2 (two) times daily. 8 capsule 0   Cholecalciferol (VITAMIN D3 PO) Take 1 capsule by mouth every morning.     citalopram (CELEXA) 10 MG tablet Take 5 mg by mouth every morning.     cloNIDine (CATAPRES) 0.1 MG tablet Take 0.1 mg by mouth 3 (three) times daily.     diltiazem (CARDIZEM CD) 120 MG 24 hr capsule Take 120 mg by mouth every morning.     furosemide (LASIX) 40 MG tablet Take 40 mg by mouth daily as needed for fluid or edema.     metoprolol succinate (TOPROL-XL) 25 MG 24 hr tablet Take 25 mg by mouth 2 (two) times daily.     mirtazapine (REMERON SOL-TAB) 15 MG disintegrating tablet Take 15 mg by mouth every morning.     pantoprazole (PROTONIX) 40 MG tablet Take 40 mg by mouth every morning.     polyethylene glycol powder (GLYCOLAX/MIRALAX) 17 GM/SCOOP powder Take 17 g by mouth daily as needed (constipation).     QUERCETIN PO  Take 1 tablet by mouth daily.     rosuvastatin (CRESTOR) 5 MG tablet Take 1 tablet (5 mg total) by mouth daily. (Patient taking differently: Take 5 mg by mouth every morning.) 30 tablet 3   valsartan (DIOVAN) 80 MG tablet Take 80 mg by mouth every morning.     Zinc Gluconate 100 MG TABS Take 100 mg by mouth every morning.     No current facility-administered medications for this visit.     Past Medical History:  Diagnosis Date   Actinic keratosis    Bilateral leg weakness 09/27/12   CAD (coronary artery disease)    EF 55% by 2 D Echo 2008 and 2012   Chronic atrial fibrillation (HCC)    Chronic edema 10/15/11   Chronic fatigue    DDD (degenerative disc disease)    Encounter for long-term (current) use of other medications 09/02/12   Endometrial carcinoma ([place])    Fibromuscular dysplasia ([place])    S/P PCI right renal aretery 1999   GERD (gastroesophageal reflux disease)    Hip fracture, right ([place]) 08/06/06   S/P surgery   Hx of cardiovascular stress test 09/2010   Nuclear stress testing showing no evidence of ischemia, EF=76%, No EKG changes & no perfusion defects.  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   LVH (left ventricular hypertrophy)    Mild   Osteoarthritis    Osteoporosis    With Hx of thoracic compression fractures   Pacemaker    Paroxysmal atrial fibrillation (HCC)    Peripheral neuropathy    Peripheral vascular disease ([place])    Pre-diabetes 01/21/11   Chronic   Renovascular hypertension    Tachy-brady syndrome (HCC)    Thoracic compression fracture (HCC)    TIA (transient ischemic attack) 08/2011   OSH: flushing, left LE numbness, CT negative   Tic [place]ou[place] 1994   S/P successful surgery   Trigeminal neuralgia of right side of face     ROS:   All systems reviewed and negative except as noted in the  HPI.   Past Surgical History:  Procedure Laterality Date   ABDOMINAL AORTAGRAM  09/05/01   Right & left renals 25%   APPENDECTOMY  1946   CARDIAC CATHETERIZATION  11/05/01   EF 72%. RCA 25%. LCX 50%. Ramus 75/100%. LAD 95%. D2 75%.   Heath   CORONARY ANGIOPLASTY     CORONARY ANGIOPLASTY WITH STENT PLACEMENT  11/05/01   PTCA/Stent to 95% mid LAD, Ramus lesion could not be crossed and was not treated.    DIPYRIDAMOLE STRESS TEST  09/2010   Nuclear stress testing showing no evidence of ischemia, EF=76%, No EKG changes & no perfusion defects.   LUMBAR DISC SURGERY     ORIF FEMUR FRACTURE Right 02/16/2017   Procedure: OPEN REDUCTION INTERNAL FIXATION (ORIF) DISTAL FEMUR FRACTURE;  Surgeon: Shona Needles, MD;  Location: [place];  Service: Orthopedics;  Laterality: Right;   RENAL ANGIOGRAM  09/14/97   25% diffuse left renal artery. 50% ostia; right renal artery.   RENAL ARTERY ANGIOPLASTY Right 1994   Renal artery stenosis secondary to fibromusclar dysplasia   RENAL ARTERY ANGIOPLASTY Right 09/13/97   PTA/Stent to ostial right renal artery, No distal fibromuscular hyperplasia   SPINAL FUSION  2002   Percutaneous spinal fusion   TOTAL ABDOMINAL HYSTERECTOMY W/ BILATERAL SALPINGOOPHORECTOMY  1990   VAGINAL HYSTERECTOMY  1990     Family History  Problem Relation Age of Onset   Stroke Father    COPD Brother    Diabetes Brother        DM   Hypertension Brother    Stroke Other      Social History   Socioeconomic History   Marital status: Widowed    Spouse name: Not on file   Number of children: Not on file   Years of education: Not on file   Highest education level: Not on file  Occupational History   Not on file  Tobacco Use   Smoking status: Never   Smokeless tobacco: Never  Vaping Use   Vaping Use: Never used  Substance and Sexual Activity   Alcohol use: Yes    Alcohol/week: 0.0 standard drinks    Comment:  Rarely   Drug use: No   Sexual activity: Not Currently  Other Topics Concern   Not on file  Social History Narrative   Not on file   Social Determinants of Health   Financial Resource Strain: Not on file  Food Insecurity: Not  on file  Transportation Needs: Not on file  Physical Activity: Not on file  Stress: Not on file  Social Connections: Not on file  Intimate Partner Violence: Not on file     BP 124/82    Pulse 76    Ht 5\' 2"  (1.575 m)    Wt 122 lb (55.3 kg)    SpO2 96%    BMI 22.31 kg/m   Physical Exam:  Well appearing NAD HEENT: Unremarkable Neck:  No JVD, no thyromegally Lymphatics:  No adenopathy Back:  No CVA tenderness Lungs:  Clear with no wheezes HEART:  Regular rate rhythm, no murmurs, no rubs, no clicks Abd:  soft, positive bowel sounds, no organomegally, no rebound, no guarding Ext:  2 plus pulses, no edema, no cyanosis, no clubbing Skin:  No rashes no nodules Neuro:  CN II through XII intact, motor grossly intact  DEVICE  Normal device function.  See PaceArt for details.   Assess/Plan:  1. Atrial fib - her VR is controlled. 2. CHB - she has an underlying rate in the low 40's today. She is asymptomatic,s/p PPM. 3. PPM -her Biotronik VVIR PPM is working normally. 4. CAD - she denies anginal symptoms.    Karina Overlie Kolleen Ochsner,MD

## 2021-06-04 NOTE — Patient Instructions (Signed)
Medication Instructions:  Your physician recommends that you continue on your current medications as directed. Please refer to the Current Medication list given to you today.  *If you need a refill on your cardiac medications before your next appointment, please call your pharmacy*   Lab Work: NONE   If you have labs (blood work) drawn today and your tests are completely normal, you will receive your results only by: . MyChart Message (if you have MyChart) OR . A paper copy in the mail If you have any lab test that is abnormal or we need to change your treatment, we will call you to review the results.   Testing/Procedures: NONE    Follow-Up: At [facility], you and your health needs are our priority.  As part of our continuing mission to provide you with exceptional heart care, we have created designated Provider Care Teams.  These Care Teams include your primary Cardiologist (physician) and Advanced Practice Providers (APPs -  Physician Assistants and Nurse Practitioners) who all work together to provide you with the care you need, when you need it.  We recommend signing up for the patient portal called "MyChart".  Sign up information is provided on this After Visit Summary.  MyChart is used to connect with patients for Virtual Visits (Telemedicine).  Patients are able to view lab/test results, encounter notes, upcoming appointments, etc.  Non-urgent messages can be sent to your provider as well.   To learn more about what you can do with MyChart, go to [email or web address].    Your next appointment:   1 year(s)  The format for your next appointment:   In Person  Provider:   Gregg Taylor, MD   Other Instructions Thank you for choosing [facility]!    

## 2021-06-05 DIAGNOSIS — E876 Hypokalemia: Secondary | ICD-10-CM | POA: Diagnosis not present

## 2021-06-05 DIAGNOSIS — E1142 Type 2 diabetes mellitus with diabetic polyneuropathy: Secondary | ICD-10-CM | POA: Diagnosis not present

## 2021-06-05 DIAGNOSIS — M810 Age-related osteoporosis without current pathological fracture: Secondary | ICD-10-CM | POA: Diagnosis not present

## 2021-06-05 DIAGNOSIS — U071 COVID-19: Secondary | ICD-10-CM | POA: Diagnosis not present

## 2021-06-05 DIAGNOSIS — E785 Hyperlipidemia, unspecified: Secondary | ICD-10-CM | POA: Diagnosis not present

## 2021-06-05 DIAGNOSIS — M1990 Unspecified osteoarthritis, unspecified site: Secondary | ICD-10-CM | POA: Diagnosis not present

## 2021-06-05 DIAGNOSIS — E8809 Other disorders of plasma-protein metabolism, not elsewhere classified: Secondary | ICD-10-CM | POA: Diagnosis not present

## 2021-06-05 DIAGNOSIS — I495 Sick sinus syndrome: Secondary | ICD-10-CM | POA: Diagnosis not present

## 2021-06-05 DIAGNOSIS — E46 Unspecified protein-calorie malnutrition: Secondary | ICD-10-CM | POA: Diagnosis not present

## 2021-06-05 DIAGNOSIS — Z87440 Personal history of urinary (tract) infections: Secondary | ICD-10-CM | POA: Diagnosis not present

## 2021-06-05 DIAGNOSIS — Z8673 Personal history of transient ischemic attack (TIA), and cerebral infarction without residual deficits: Secondary | ICD-10-CM | POA: Diagnosis not present

## 2021-06-05 DIAGNOSIS — E1151 Type 2 diabetes mellitus with diabetic peripheral angiopathy without gangrene: Secondary | ICD-10-CM | POA: Diagnosis not present

## 2021-06-05 DIAGNOSIS — M519 Unspecified thoracic, thoracolumbar and lumbosacral intervertebral disc disorder: Secondary | ICD-10-CM | POA: Diagnosis not present

## 2021-06-05 DIAGNOSIS — I480 Paroxysmal atrial fibrillation: Secondary | ICD-10-CM | POA: Diagnosis not present

## 2021-06-05 DIAGNOSIS — I119 Hypertensive heart disease without heart failure: Secondary | ICD-10-CM | POA: Diagnosis not present

## 2021-06-05 DIAGNOSIS — E871 Hypo-osmolality and hyponatremia: Secondary | ICD-10-CM | POA: Diagnosis not present

## 2021-06-05 DIAGNOSIS — D72829 Elevated white blood cell count, unspecified: Secondary | ICD-10-CM | POA: Diagnosis not present

## 2021-06-05 DIAGNOSIS — I2510 Atherosclerotic heart disease of native coronary artery without angina pectoris: Secondary | ICD-10-CM | POA: Diagnosis not present

## 2021-06-05 DIAGNOSIS — K219 Gastro-esophageal reflux disease without esophagitis: Secondary | ICD-10-CM | POA: Diagnosis not present

## 2021-06-05 DIAGNOSIS — J9601 Acute respiratory failure with hypoxia: Secondary | ICD-10-CM | POA: Diagnosis not present

## 2021-06-05 DIAGNOSIS — I773 Arterial fibromuscular dysplasia: Secondary | ICD-10-CM | POA: Diagnosis not present

## 2021-06-05 DIAGNOSIS — Z950 Presence of cardiac pacemaker: Secondary | ICD-10-CM | POA: Diagnosis not present

## 2021-06-05 DIAGNOSIS — Z7901 Long term (current) use of anticoagulants: Secondary | ICD-10-CM | POA: Diagnosis not present

## 2021-06-06 ENCOUNTER — Telehealth: Payer: Self-pay | Admitting: Internal Medicine

## 2021-06-06 NOTE — Telephone Encounter (Signed)
Patient wanted Dr. [name] to refer her to an ENT closer to her in [place] or [place]. She said her ears are still bothering her and does not know anyone locally who could help her out . ? ? ?

## 2021-06-06 NOTE — Telephone Encounter (Signed)
I will forward to Dr.Taylor for review. ? ?We could refer her to ENT in [place] ?

## 2021-06-07 DIAGNOSIS — M519 Unspecified thoracic, thoracolumbar and lumbosacral intervertebral disc disorder: Secondary | ICD-10-CM | POA: Diagnosis not present

## 2021-06-07 DIAGNOSIS — U071 COVID-19: Secondary | ICD-10-CM | POA: Diagnosis not present

## 2021-06-07 DIAGNOSIS — E785 Hyperlipidemia, unspecified: Secondary | ICD-10-CM | POA: Diagnosis not present

## 2021-06-07 DIAGNOSIS — E1142 Type 2 diabetes mellitus with diabetic polyneuropathy: Secondary | ICD-10-CM | POA: Diagnosis not present

## 2021-06-07 DIAGNOSIS — I119 Hypertensive heart disease without heart failure: Secondary | ICD-10-CM | POA: Diagnosis not present

## 2021-06-07 DIAGNOSIS — E8809 Other disorders of plasma-protein metabolism, not elsewhere classified: Secondary | ICD-10-CM | POA: Diagnosis not present

## 2021-06-07 DIAGNOSIS — I2510 Atherosclerotic heart disease of native coronary artery without angina pectoris: Secondary | ICD-10-CM | POA: Diagnosis not present

## 2021-06-07 DIAGNOSIS — Z950 Presence of cardiac pacemaker: Secondary | ICD-10-CM | POA: Diagnosis not present

## 2021-06-07 DIAGNOSIS — Z87440 Personal history of urinary (tract) infections: Secondary | ICD-10-CM | POA: Diagnosis not present

## 2021-06-07 DIAGNOSIS — I480 Paroxysmal atrial fibrillation: Secondary | ICD-10-CM | POA: Diagnosis not present

## 2021-06-07 DIAGNOSIS — E46 Unspecified protein-calorie malnutrition: Secondary | ICD-10-CM | POA: Diagnosis not present

## 2021-06-07 DIAGNOSIS — J9601 Acute respiratory failure with hypoxia: Secondary | ICD-10-CM | POA: Diagnosis not present

## 2021-06-07 DIAGNOSIS — M1990 Unspecified osteoarthritis, unspecified site: Secondary | ICD-10-CM | POA: Diagnosis not present

## 2021-06-07 DIAGNOSIS — Z7901 Long term (current) use of anticoagulants: Secondary | ICD-10-CM | POA: Diagnosis not present

## 2021-06-07 DIAGNOSIS — M810 Age-related osteoporosis without current pathological fracture: Secondary | ICD-10-CM | POA: Diagnosis not present

## 2021-06-07 DIAGNOSIS — D72829 Elevated white blood cell count, unspecified: Secondary | ICD-10-CM | POA: Diagnosis not present

## 2021-06-07 DIAGNOSIS — K219 Gastro-esophageal reflux disease without esophagitis: Secondary | ICD-10-CM | POA: Diagnosis not present

## 2021-06-07 DIAGNOSIS — I773 Arterial fibromuscular dysplasia: Secondary | ICD-10-CM | POA: Diagnosis not present

## 2021-06-07 DIAGNOSIS — E871 Hypo-osmolality and hyponatremia: Secondary | ICD-10-CM | POA: Diagnosis not present

## 2021-06-07 DIAGNOSIS — Z8673 Personal history of transient ischemic attack (TIA), and cerebral infarction without residual deficits: Secondary | ICD-10-CM | POA: Diagnosis not present

## 2021-06-07 DIAGNOSIS — E876 Hypokalemia: Secondary | ICD-10-CM | POA: Diagnosis not present

## 2021-06-07 DIAGNOSIS — I495 Sick sinus syndrome: Secondary | ICD-10-CM | POA: Diagnosis not present

## 2021-06-07 DIAGNOSIS — E1151 Type 2 diabetes mellitus with diabetic peripheral angiopathy without gangrene: Secondary | ICD-10-CM | POA: Diagnosis not present

## 2021-06-11 DIAGNOSIS — I495 Sick sinus syndrome: Secondary | ICD-10-CM | POA: Diagnosis not present

## 2021-06-11 DIAGNOSIS — Z950 Presence of cardiac pacemaker: Secondary | ICD-10-CM | POA: Diagnosis not present

## 2021-06-11 DIAGNOSIS — E1151 Type 2 diabetes mellitus with diabetic peripheral angiopathy without gangrene: Secondary | ICD-10-CM | POA: Diagnosis not present

## 2021-06-11 DIAGNOSIS — I773 Arterial fibromuscular dysplasia: Secondary | ICD-10-CM | POA: Diagnosis not present

## 2021-06-11 DIAGNOSIS — Z8673 Personal history of transient ischemic attack (TIA), and cerebral infarction without residual deficits: Secondary | ICD-10-CM | POA: Diagnosis not present

## 2021-06-11 DIAGNOSIS — J9601 Acute respiratory failure with hypoxia: Secondary | ICD-10-CM | POA: Diagnosis not present

## 2021-06-11 DIAGNOSIS — K219 Gastro-esophageal reflux disease without esophagitis: Secondary | ICD-10-CM | POA: Diagnosis not present

## 2021-06-11 DIAGNOSIS — M1990 Unspecified osteoarthritis, unspecified site: Secondary | ICD-10-CM | POA: Diagnosis not present

## 2021-06-11 DIAGNOSIS — U071 COVID-19: Secondary | ICD-10-CM | POA: Diagnosis not present

## 2021-06-11 DIAGNOSIS — E8809 Other disorders of plasma-protein metabolism, not elsewhere classified: Secondary | ICD-10-CM | POA: Diagnosis not present

## 2021-06-11 DIAGNOSIS — E876 Hypokalemia: Secondary | ICD-10-CM | POA: Diagnosis not present

## 2021-06-11 DIAGNOSIS — I2510 Atherosclerotic heart disease of native coronary artery without angina pectoris: Secondary | ICD-10-CM | POA: Diagnosis not present

## 2021-06-11 DIAGNOSIS — M519 Unspecified thoracic, thoracolumbar and lumbosacral intervertebral disc disorder: Secondary | ICD-10-CM | POA: Diagnosis not present

## 2021-06-11 DIAGNOSIS — Z7901 Long term (current) use of anticoagulants: Secondary | ICD-10-CM | POA: Diagnosis not present

## 2021-06-11 DIAGNOSIS — E46 Unspecified protein-calorie malnutrition: Secondary | ICD-10-CM | POA: Diagnosis not present

## 2021-06-11 DIAGNOSIS — I119 Hypertensive heart disease without heart failure: Secondary | ICD-10-CM | POA: Diagnosis not present

## 2021-06-11 DIAGNOSIS — D72829 Elevated white blood cell count, unspecified: Secondary | ICD-10-CM | POA: Diagnosis not present

## 2021-06-11 DIAGNOSIS — I480 Paroxysmal atrial fibrillation: Secondary | ICD-10-CM | POA: Diagnosis not present

## 2021-06-11 DIAGNOSIS — M810 Age-related osteoporosis without current pathological fracture: Secondary | ICD-10-CM | POA: Diagnosis not present

## 2021-06-11 DIAGNOSIS — Z87440 Personal history of urinary (tract) infections: Secondary | ICD-10-CM | POA: Diagnosis not present

## 2021-06-11 DIAGNOSIS — E785 Hyperlipidemia, unspecified: Secondary | ICD-10-CM | POA: Diagnosis not present

## 2021-06-11 DIAGNOSIS — E1142 Type 2 diabetes mellitus with diabetic polyneuropathy: Secondary | ICD-10-CM | POA: Diagnosis not present

## 2021-06-11 DIAGNOSIS — E871 Hypo-osmolality and hyponatremia: Secondary | ICD-10-CM | POA: Diagnosis not present

## 2021-06-12 DIAGNOSIS — Z299 Encounter for prophylactic measures, unspecified: Secondary | ICD-10-CM | POA: Diagnosis not present

## 2021-06-12 DIAGNOSIS — I10 Essential (primary) hypertension: Secondary | ICD-10-CM | POA: Diagnosis not present

## 2021-06-12 DIAGNOSIS — H6123 Impacted cerumen, bilateral: Secondary | ICD-10-CM | POA: Diagnosis not present

## 2021-06-13 DIAGNOSIS — J9601 Acute respiratory failure with hypoxia: Secondary | ICD-10-CM | POA: Diagnosis not present

## 2021-06-13 DIAGNOSIS — Z87440 Personal history of urinary (tract) infections: Secondary | ICD-10-CM | POA: Diagnosis not present

## 2021-06-13 DIAGNOSIS — M1990 Unspecified osteoarthritis, unspecified site: Secondary | ICD-10-CM | POA: Diagnosis not present

## 2021-06-13 DIAGNOSIS — Z8673 Personal history of transient ischemic attack (TIA), and cerebral infarction without residual deficits: Secondary | ICD-10-CM | POA: Diagnosis not present

## 2021-06-13 DIAGNOSIS — M810 Age-related osteoporosis without current pathological fracture: Secondary | ICD-10-CM | POA: Diagnosis not present

## 2021-06-13 DIAGNOSIS — U071 COVID-19: Secondary | ICD-10-CM | POA: Diagnosis not present

## 2021-06-13 DIAGNOSIS — I480 Paroxysmal atrial fibrillation: Secondary | ICD-10-CM | POA: Diagnosis not present

## 2021-06-13 DIAGNOSIS — I2510 Atherosclerotic heart disease of native coronary artery without angina pectoris: Secondary | ICD-10-CM | POA: Diagnosis not present

## 2021-06-13 DIAGNOSIS — E785 Hyperlipidemia, unspecified: Secondary | ICD-10-CM | POA: Diagnosis not present

## 2021-06-13 DIAGNOSIS — Z950 Presence of cardiac pacemaker: Secondary | ICD-10-CM | POA: Diagnosis not present

## 2021-06-13 DIAGNOSIS — D72829 Elevated white blood cell count, unspecified: Secondary | ICD-10-CM | POA: Diagnosis not present

## 2021-06-13 DIAGNOSIS — E876 Hypokalemia: Secondary | ICD-10-CM | POA: Diagnosis not present

## 2021-06-13 DIAGNOSIS — E871 Hypo-osmolality and hyponatremia: Secondary | ICD-10-CM | POA: Diagnosis not present

## 2021-06-13 DIAGNOSIS — E46 Unspecified protein-calorie malnutrition: Secondary | ICD-10-CM | POA: Diagnosis not present

## 2021-06-13 DIAGNOSIS — E1151 Type 2 diabetes mellitus with diabetic peripheral angiopathy without gangrene: Secondary | ICD-10-CM | POA: Diagnosis not present

## 2021-06-13 DIAGNOSIS — E1142 Type 2 diabetes mellitus with diabetic polyneuropathy: Secondary | ICD-10-CM | POA: Diagnosis not present

## 2021-06-13 DIAGNOSIS — I495 Sick sinus syndrome: Secondary | ICD-10-CM | POA: Diagnosis not present

## 2021-06-13 DIAGNOSIS — M519 Unspecified thoracic, thoracolumbar and lumbosacral intervertebral disc disorder: Secondary | ICD-10-CM | POA: Diagnosis not present

## 2021-06-13 DIAGNOSIS — I119 Hypertensive heart disease without heart failure: Secondary | ICD-10-CM | POA: Diagnosis not present

## 2021-06-13 DIAGNOSIS — I773 Arterial fibromuscular dysplasia: Secondary | ICD-10-CM | POA: Diagnosis not present

## 2021-06-13 DIAGNOSIS — Z7901 Long term (current) use of anticoagulants: Secondary | ICD-10-CM | POA: Diagnosis not present

## 2021-06-13 DIAGNOSIS — I10 Essential (primary) hypertension: Secondary | ICD-10-CM | POA: Diagnosis not present

## 2021-06-13 DIAGNOSIS — E8809 Other disorders of plasma-protein metabolism, not elsewhere classified: Secondary | ICD-10-CM | POA: Diagnosis not present

## 2021-06-13 DIAGNOSIS — K219 Gastro-esophageal reflux disease without esophagitis: Secondary | ICD-10-CM | POA: Diagnosis not present

## 2021-06-19 DIAGNOSIS — I495 Sick sinus syndrome: Secondary | ICD-10-CM | POA: Diagnosis not present

## 2021-06-19 DIAGNOSIS — Z950 Presence of cardiac pacemaker: Secondary | ICD-10-CM | POA: Diagnosis not present

## 2021-06-19 DIAGNOSIS — J9601 Acute respiratory failure with hypoxia: Secondary | ICD-10-CM | POA: Diagnosis not present

## 2021-06-19 DIAGNOSIS — I480 Paroxysmal atrial fibrillation: Secondary | ICD-10-CM | POA: Diagnosis not present

## 2021-06-19 DIAGNOSIS — E1142 Type 2 diabetes mellitus with diabetic polyneuropathy: Secondary | ICD-10-CM | POA: Diagnosis not present

## 2021-06-19 DIAGNOSIS — Z7901 Long term (current) use of anticoagulants: Secondary | ICD-10-CM | POA: Diagnosis not present

## 2021-06-19 DIAGNOSIS — I119 Hypertensive heart disease without heart failure: Secondary | ICD-10-CM | POA: Diagnosis not present

## 2021-06-19 DIAGNOSIS — I773 Arterial fibromuscular dysplasia: Secondary | ICD-10-CM | POA: Diagnosis not present

## 2021-06-19 DIAGNOSIS — E8809 Other disorders of plasma-protein metabolism, not elsewhere classified: Secondary | ICD-10-CM | POA: Diagnosis not present

## 2021-06-19 DIAGNOSIS — E785 Hyperlipidemia, unspecified: Secondary | ICD-10-CM | POA: Diagnosis not present

## 2021-06-19 DIAGNOSIS — M810 Age-related osteoporosis without current pathological fracture: Secondary | ICD-10-CM | POA: Diagnosis not present

## 2021-06-19 DIAGNOSIS — E876 Hypokalemia: Secondary | ICD-10-CM | POA: Diagnosis not present

## 2021-06-19 DIAGNOSIS — M519 Unspecified thoracic, thoracolumbar and lumbosacral intervertebral disc disorder: Secondary | ICD-10-CM | POA: Diagnosis not present

## 2021-06-19 DIAGNOSIS — E871 Hypo-osmolality and hyponatremia: Secondary | ICD-10-CM | POA: Diagnosis not present

## 2021-06-19 DIAGNOSIS — Z87440 Personal history of urinary (tract) infections: Secondary | ICD-10-CM | POA: Diagnosis not present

## 2021-06-19 DIAGNOSIS — K219 Gastro-esophageal reflux disease without esophagitis: Secondary | ICD-10-CM | POA: Diagnosis not present

## 2021-06-19 DIAGNOSIS — I2510 Atherosclerotic heart disease of native coronary artery without angina pectoris: Secondary | ICD-10-CM | POA: Diagnosis not present

## 2021-06-19 DIAGNOSIS — E46 Unspecified protein-calorie malnutrition: Secondary | ICD-10-CM | POA: Diagnosis not present

## 2021-06-19 DIAGNOSIS — M1990 Unspecified osteoarthritis, unspecified site: Secondary | ICD-10-CM | POA: Diagnosis not present

## 2021-06-19 DIAGNOSIS — D72829 Elevated white blood cell count, unspecified: Secondary | ICD-10-CM | POA: Diagnosis not present

## 2021-06-19 DIAGNOSIS — Z8673 Personal history of transient ischemic attack (TIA), and cerebral infarction without residual deficits: Secondary | ICD-10-CM | POA: Diagnosis not present

## 2021-06-19 DIAGNOSIS — E1151 Type 2 diabetes mellitus with diabetic peripheral angiopathy without gangrene: Secondary | ICD-10-CM | POA: Diagnosis not present

## 2021-06-19 DIAGNOSIS — U071 COVID-19: Secondary | ICD-10-CM | POA: Diagnosis not present

## 2021-06-21 NOTE — Telephone Encounter (Signed)
No answer,no machine on home line.left message to return call on cell. ?

## 2021-06-21 NOTE — Telephone Encounter (Signed)
I spoke with patient and relayed Dr.Taylor's message. ?

## 2021-06-24 ENCOUNTER — Telehealth: Payer: Self-pay

## 2021-06-24 NOTE — Telephone Encounter (Signed)
Biotronik monitor has not connected within 21 days. Patient called and asked if she could plug her monitor. States she will plug it in today. Apperceive of call.  ? ?

## 2021-06-25 DIAGNOSIS — E1142 Type 2 diabetes mellitus with diabetic polyneuropathy: Secondary | ICD-10-CM | POA: Diagnosis not present

## 2021-06-25 DIAGNOSIS — E46 Unspecified protein-calorie malnutrition: Secondary | ICD-10-CM | POA: Diagnosis not present

## 2021-06-25 DIAGNOSIS — J9601 Acute respiratory failure with hypoxia: Secondary | ICD-10-CM | POA: Diagnosis not present

## 2021-06-25 DIAGNOSIS — Z8673 Personal history of transient ischemic attack (TIA), and cerebral infarction without residual deficits: Secondary | ICD-10-CM | POA: Diagnosis not present

## 2021-06-25 DIAGNOSIS — Z87440 Personal history of urinary (tract) infections: Secondary | ICD-10-CM | POA: Diagnosis not present

## 2021-06-25 DIAGNOSIS — I495 Sick sinus syndrome: Secondary | ICD-10-CM | POA: Diagnosis not present

## 2021-06-25 DIAGNOSIS — E876 Hypokalemia: Secondary | ICD-10-CM | POA: Diagnosis not present

## 2021-06-25 DIAGNOSIS — E871 Hypo-osmolality and hyponatremia: Secondary | ICD-10-CM | POA: Diagnosis not present

## 2021-06-25 DIAGNOSIS — E785 Hyperlipidemia, unspecified: Secondary | ICD-10-CM | POA: Diagnosis not present

## 2021-06-25 DIAGNOSIS — K219 Gastro-esophageal reflux disease without esophagitis: Secondary | ICD-10-CM | POA: Diagnosis not present

## 2021-06-25 DIAGNOSIS — M519 Unspecified thoracic, thoracolumbar and lumbosacral intervertebral disc disorder: Secondary | ICD-10-CM | POA: Diagnosis not present

## 2021-06-25 DIAGNOSIS — I480 Paroxysmal atrial fibrillation: Secondary | ICD-10-CM | POA: Diagnosis not present

## 2021-06-25 DIAGNOSIS — M1990 Unspecified osteoarthritis, unspecified site: Secondary | ICD-10-CM | POA: Diagnosis not present

## 2021-06-25 DIAGNOSIS — Z7901 Long term (current) use of anticoagulants: Secondary | ICD-10-CM | POA: Diagnosis not present

## 2021-06-25 DIAGNOSIS — Z950 Presence of cardiac pacemaker: Secondary | ICD-10-CM | POA: Diagnosis not present

## 2021-06-25 DIAGNOSIS — I119 Hypertensive heart disease without heart failure: Secondary | ICD-10-CM | POA: Diagnosis not present

## 2021-06-25 DIAGNOSIS — U071 COVID-19: Secondary | ICD-10-CM | POA: Diagnosis not present

## 2021-06-25 DIAGNOSIS — E8809 Other disorders of plasma-protein metabolism, not elsewhere classified: Secondary | ICD-10-CM | POA: Diagnosis not present

## 2021-06-25 DIAGNOSIS — E1151 Type 2 diabetes mellitus with diabetic peripheral angiopathy without gangrene: Secondary | ICD-10-CM | POA: Diagnosis not present

## 2021-06-25 DIAGNOSIS — D72829 Elevated white blood cell count, unspecified: Secondary | ICD-10-CM | POA: Diagnosis not present

## 2021-06-25 DIAGNOSIS — I773 Arterial fibromuscular dysplasia: Secondary | ICD-10-CM | POA: Diagnosis not present

## 2021-06-25 DIAGNOSIS — I2510 Atherosclerotic heart disease of native coronary artery without angina pectoris: Secondary | ICD-10-CM | POA: Diagnosis not present

## 2021-06-25 DIAGNOSIS — M810 Age-related osteoporosis without current pathological fracture: Secondary | ICD-10-CM | POA: Diagnosis not present

## 2021-07-03 DIAGNOSIS — M810 Age-related osteoporosis without current pathological fracture: Secondary | ICD-10-CM | POA: Diagnosis not present

## 2021-07-03 DIAGNOSIS — E46 Unspecified protein-calorie malnutrition: Secondary | ICD-10-CM | POA: Diagnosis not present

## 2021-07-03 DIAGNOSIS — E871 Hypo-osmolality and hyponatremia: Secondary | ICD-10-CM | POA: Diagnosis not present

## 2021-07-03 DIAGNOSIS — E785 Hyperlipidemia, unspecified: Secondary | ICD-10-CM | POA: Diagnosis not present

## 2021-07-03 DIAGNOSIS — I495 Sick sinus syndrome: Secondary | ICD-10-CM | POA: Diagnosis not present

## 2021-07-03 DIAGNOSIS — Z87440 Personal history of urinary (tract) infections: Secondary | ICD-10-CM | POA: Diagnosis not present

## 2021-07-03 DIAGNOSIS — I773 Arterial fibromuscular dysplasia: Secondary | ICD-10-CM | POA: Diagnosis not present

## 2021-07-03 DIAGNOSIS — M1990 Unspecified osteoarthritis, unspecified site: Secondary | ICD-10-CM | POA: Diagnosis not present

## 2021-07-03 DIAGNOSIS — Z950 Presence of cardiac pacemaker: Secondary | ICD-10-CM | POA: Diagnosis not present

## 2021-07-03 DIAGNOSIS — J9601 Acute respiratory failure with hypoxia: Secondary | ICD-10-CM | POA: Diagnosis not present

## 2021-07-03 DIAGNOSIS — Z7901 Long term (current) use of anticoagulants: Secondary | ICD-10-CM | POA: Diagnosis not present

## 2021-07-03 DIAGNOSIS — E8809 Other disorders of plasma-protein metabolism, not elsewhere classified: Secondary | ICD-10-CM | POA: Diagnosis not present

## 2021-07-03 DIAGNOSIS — M519 Unspecified thoracic, thoracolumbar and lumbosacral intervertebral disc disorder: Secondary | ICD-10-CM | POA: Diagnosis not present

## 2021-07-03 DIAGNOSIS — D72829 Elevated white blood cell count, unspecified: Secondary | ICD-10-CM | POA: Diagnosis not present

## 2021-07-03 DIAGNOSIS — I119 Hypertensive heart disease without heart failure: Secondary | ICD-10-CM | POA: Diagnosis not present

## 2021-07-03 DIAGNOSIS — I2510 Atherosclerotic heart disease of native coronary artery without angina pectoris: Secondary | ICD-10-CM | POA: Diagnosis not present

## 2021-07-03 DIAGNOSIS — I480 Paroxysmal atrial fibrillation: Secondary | ICD-10-CM | POA: Diagnosis not present

## 2021-07-03 DIAGNOSIS — K219 Gastro-esophageal reflux disease without esophagitis: Secondary | ICD-10-CM | POA: Diagnosis not present

## 2021-07-03 DIAGNOSIS — E1142 Type 2 diabetes mellitus with diabetic polyneuropathy: Secondary | ICD-10-CM | POA: Diagnosis not present

## 2021-07-03 DIAGNOSIS — E1151 Type 2 diabetes mellitus with diabetic peripheral angiopathy without gangrene: Secondary | ICD-10-CM | POA: Diagnosis not present

## 2021-07-03 DIAGNOSIS — U071 COVID-19: Secondary | ICD-10-CM | POA: Diagnosis not present

## 2021-07-03 DIAGNOSIS — E876 Hypokalemia: Secondary | ICD-10-CM | POA: Diagnosis not present

## 2021-07-03 DIAGNOSIS — Z8673 Personal history of transient ischemic attack (TIA), and cerebral infarction without residual deficits: Secondary | ICD-10-CM | POA: Diagnosis not present

## 2021-08-04 DIAGNOSIS — I10 Essential (primary) hypertension: Secondary | ICD-10-CM | POA: Diagnosis not present

## 2021-08-04 DIAGNOSIS — E7800 Pure hypercholesterolemia, unspecified: Secondary | ICD-10-CM | POA: Diagnosis not present

## 2021-08-16 DIAGNOSIS — Z6823 Body mass index (BMI) 23.0-23.9, adult: Secondary | ICD-10-CM | POA: Diagnosis not present

## 2021-08-16 DIAGNOSIS — R5383 Other fatigue: Secondary | ICD-10-CM | POA: Diagnosis not present

## 2021-08-16 DIAGNOSIS — I4891 Unspecified atrial fibrillation: Secondary | ICD-10-CM | POA: Diagnosis not present

## 2021-08-16 DIAGNOSIS — I509 Heart failure, unspecified: Secondary | ICD-10-CM | POA: Diagnosis not present

## 2021-08-16 DIAGNOSIS — Z299 Encounter for prophylactic measures, unspecified: Secondary | ICD-10-CM | POA: Diagnosis not present

## 2021-08-16 DIAGNOSIS — I10 Essential (primary) hypertension: Secondary | ICD-10-CM | POA: Diagnosis not present

## 2021-08-27 DIAGNOSIS — I110 Hypertensive heart disease with heart failure: Secondary | ICD-10-CM | POA: Diagnosis not present

## 2021-08-27 DIAGNOSIS — I482 Chronic atrial fibrillation, unspecified: Secondary | ICD-10-CM | POA: Diagnosis not present

## 2021-08-27 DIAGNOSIS — I5033 Acute on chronic diastolic (congestive) heart failure: Secondary | ICD-10-CM | POA: Diagnosis not present

## 2021-09-09 ENCOUNTER — Emergency Department (HOSPITAL_COMMUNITY)
Admission: EM | Admit: 2021-09-09 | Discharge: 2021-09-09 | Disposition: A | Discharge status: Home / Self Care | Payer: Medicare Other | Attending: Emergency Medicine | Admitting: Emergency Medicine

## 2021-09-09 ENCOUNTER — Emergency Department (HOSPITAL_COMMUNITY): Payer: Medicare Other

## 2021-09-09 ENCOUNTER — Other Ambulatory Visit: Payer: Self-pay

## 2021-09-09 ENCOUNTER — Encounter (HOSPITAL_COMMUNITY): Payer: Self-pay | Admitting: *Deleted

## 2021-09-09 DIAGNOSIS — R42 Dizziness and giddiness: Secondary | ICD-10-CM | POA: Diagnosis not present

## 2021-09-09 DIAGNOSIS — I10 Essential (primary) hypertension: Secondary | ICD-10-CM | POA: Diagnosis not present

## 2021-09-09 DIAGNOSIS — Z79899 Other long term (current) drug therapy: Secondary | ICD-10-CM | POA: Diagnosis not present

## 2021-09-09 DIAGNOSIS — Z7901 Long term (current) use of anticoagulants: Secondary | ICD-10-CM | POA: Diagnosis not present

## 2021-09-09 DIAGNOSIS — N3000 Acute cystitis without hematuria: Secondary | ICD-10-CM | POA: Insufficient documentation

## 2021-09-09 DIAGNOSIS — R410 Disorientation, unspecified: Secondary | ICD-10-CM | POA: Diagnosis present

## 2021-09-09 DIAGNOSIS — F0390 Unspecified dementia without behavioral disturbance: Secondary | ICD-10-CM | POA: Diagnosis not present

## 2021-09-09 DIAGNOSIS — Z743 Need for continuous supervision: Secondary | ICD-10-CM | POA: Diagnosis not present

## 2021-09-09 DIAGNOSIS — I672 Cerebral atherosclerosis: Secondary | ICD-10-CM | POA: Diagnosis not present

## 2021-09-09 DIAGNOSIS — I6529 Occlusion and stenosis of unspecified carotid artery: Secondary | ICD-10-CM | POA: Diagnosis not present

## 2021-09-09 DIAGNOSIS — R6889 Other general symptoms and signs: Secondary | ICD-10-CM | POA: Diagnosis not present

## 2021-09-09 LAB — CBC WITH DIFFERENTIAL/PLATELET
Abs Immature Granulocytes: 0.05 10*3/uL (ref 0.00–0.07)
Basophils Absolute: 0.1 10*3/uL (ref 0.0–0.1)
Basophils Relative: 1 %
Eosinophils Absolute: 0.2 10*3/uL (ref 0.0–0.5)
Eosinophils Relative: 2 %
HCT: 43.2 % (ref 36.0–46.0)
Hemoglobin: 14.3 g/dL (ref 12.0–15.0)
Immature Granulocytes: 1 %
Lymphocytes Relative: 19 %
Lymphs Abs: 1.6 10*3/uL (ref 0.7–4.0)
MCH: 29.6 pg (ref 26.0–34.0)
MCHC: 33.1 g/dL (ref 30.0–36.0)
MCV: 89.4 fL (ref 80.0–100.0)
Monocytes Absolute: 1.1 10*3/uL — ABNORMAL HIGH (ref 0.1–1.0)
Monocytes Relative: 13 %
Neutro Abs: 5.4 10*3/uL (ref 1.7–7.7)
Neutrophils Relative %: 64 %
Platelets: 276 10*3/uL (ref 150–400)
RBC: 4.83 MIL/uL (ref 3.87–5.11)
RDW: 12.6 % (ref 11.5–15.5)
WBC: 8.4 10*3/uL (ref 4.0–10.5)
nRBC: 0 % (ref 0.0–0.2)

## 2021-09-09 LAB — URINALYSIS, ROUTINE W REFLEX MICROSCOPIC
Bilirubin Urine: NEGATIVE
Glucose, UA: NEGATIVE mg/dL
Hgb urine dipstick: NEGATIVE
Ketones, ur: NEGATIVE mg/dL
Leukocytes,Ua: NEGATIVE
Nitrite: NEGATIVE
Protein, ur: 30 mg/dL — AB
Specific Gravity, Urine: 1.008 (ref 1.005–1.030)
pH: 7 (ref 5.0–8.0)

## 2021-09-09 LAB — COMPREHENSIVE METABOLIC PANEL
ALT: 12 U/L (ref 0–44)
AST: 20 U/L (ref 15–41)
Albumin: 4.1 g/dL (ref 3.5–5.0)
Alkaline Phosphatase: 80 U/L (ref 38–126)
Anion gap: 9 (ref 5–15)
BUN: 14 mg/dL (ref 8–23)
CO2: 24 mmol/L (ref 22–32)
Calcium: 9.8 mg/dL (ref 8.9–10.3)
Chloride: 100 mmol/L (ref 98–111)
Creatinine, Ser: 0.86 mg/dL (ref 0.44–1.00)
GFR, Estimated: >60 mL/min (ref >60)
Glucose, Bld: 107 mg/dL — ABNORMAL HIGH (ref 70–99)
Potassium: 3.8 mmol/L (ref 3.5–5.1)
Sodium: 133 mmol/L — ABNORMAL LOW (ref 135–145)
Total Bilirubin: 1.2 mg/dL (ref 0.3–1.2)
Total Protein: 7.1 g/dL (ref 6.5–8.1)

## 2021-09-09 MED ORDER — SODIUM CHLORIDE 0.9 % IV BOLUS
250.0000 mL | Freq: Once | INTRAVENOUS | Status: AC
  Administered 2021-09-09: 250 mL via INTRAVENOUS

## 2021-09-09 MED ORDER — SULFAMETHOXAZOLE-TRIMETHOPRIM 800-160 MG PO TABS: 1.0000 | ORAL_TABLET | Freq: Two times a day (BID) | ORAL | 0 refills | Status: AC

## 2021-09-09 MED ORDER — SULFAMETHOXAZOLE-TRIMETHOPRIM 800-160 MG PO TABS
1.0000 | ORAL_TABLET | Freq: Once | ORAL | Status: AC
Start: 2021-09-09 — End: 2021-09-09
  Administered 2021-09-09: 1 via ORAL
  Filled 2021-09-09: qty 1

## 2021-09-09 MED ORDER — IRBESARTAN 75 MG PO TABS
75.0000 mg | ORAL_TABLET | Freq: Every day | ORAL | Status: DC
  Administered 2021-09-09: 75 mg via ORAL
  Filled 2021-09-09: qty 1

## 2021-09-09 NOTE — ED Notes (Signed)
Pt sitting at the door, the pt has unhooked all monitors, pt placed in a chair close to the nurses station, MD aware, pt friend [name] contacted to come to the hospital per the MD request to discuss plan of care

## 2021-09-09 NOTE — ED Notes (Signed)
Roderic Palau, MD at bedside

## 2021-09-09 NOTE — ED Triage Notes (Signed)
Pt brought in by ems for c/o hypertension; ems reports 200/100's; pt denies any pain but pt states she feels confused

## 2021-09-09 NOTE — ED Notes (Signed)
MD notified re: pts BP

## 2021-09-09 NOTE — ED Notes (Signed)
This RN spoke with spoke with the pts son on the phone re: plan of care

## 2021-09-09 NOTE — ED Notes (Signed)
[name], the pts friend called requesting an update on the pt, the pt granted permission to speak with the caller on the phone re: an update, the pt is aware of the pts status at this time

## 2021-09-09 NOTE — ED Provider Notes (Signed)
[facility] EMERGENCY DEPARTMENT Provider Note   CSN: [id number] Arrival date & time: 09/09/21  1441     History  Chief Complaint  Patient presents with   Hypertension    Karina [name] is a 88 y.o. female.  Patient brought in for elevated blood pressure and mild confusion.  Patient has a history of hypertension.  Patient states her blood pressures been running high for last few days and she feels mildly confused  The history is provided by the patient and medical records. No language interpreter was used.  Hypertension This is a chronic problem. The problem occurs constantly. The problem has not changed since onset.Pertinent negatives include no chest pain, no abdominal pain and no headaches. She has tried nothing for the symptoms.      Home Medications Prior to Admission medications   Medication Sig Start Date End Date Taking? Authorizing Provider  sulfamethoxazole-trimethoprim (BACTRIM DS) 800-160 MG tablet Take 1 tablet by mouth 2 (two) times daily for 7 days. 09/09/21 09/16/21 Yes Bethann Berkshire, MD  apixaban (ELIQUIS) 2.5 MG TABS tablet Take 1 tablet (2.5 mg total) 2 (two) times daily by mouth. 02/23/17   Renne Musca, MD  Ascorbic Acid (VITAMIN C PO) Take 1 tablet by mouth every morning.    [provider]  cefdinir (OMNICEF) 300 MG capsule Take 1 capsule (300 mg total) by mouth 2 (two) times daily. 05/08/21   Tyrone Nine, MD  Cholecalciferol (VITAMIN D3 PO) Take 1 capsule by mouth every morning.    [provider]  citalopram (CELEXA) 10 MG tablet Take 5 mg by mouth every morning. 04/17/21   [provider]  cloNIDine (CATAPRES) 0.1 MG tablet Take 0.1 mg by mouth 3 (three) times daily. 05/07/09   [provider]  diltiazem (CARDIZEM CD) 120 MG 24 hr capsule Take 120 mg by mouth every morning. 11/15/14   [provider]  furosemide (LASIX) 40 MG tablet Take 40 mg by mouth daily as needed for fluid or edema.    [provider]  metoprolol succinate (TOPROL-XL) 25 MG 24 hr tablet Take 25 mg by mouth 2 (two) times daily. 04/17/21   [provider]  mirtazapine (REMERON SOL-TAB) 15 MG disintegrating tablet Take 15 mg by mouth every morning.    [provider]  pantoprazole (PROTONIX) 40 MG tablet Take 40 mg by mouth every morning.    [provider]  polyethylene glycol powder (GLYCOLAX/MIRALAX) 17 GM/SCOOP powder Take 17 g by mouth daily as needed (constipation). 02/23/17   [provider]  QUERCETIN PO Take 1 tablet by mouth daily.    [provider]  rosuvastatin (CRESTOR) 5 MG tablet Take 1 tablet (5 mg total) by mouth daily. Patient taking differently: Take 5 mg by mouth every morning. 12/21/18   Shon Hale, MD  valsartan (DIOVAN) 80 MG tablet Take 80 mg by mouth every morning.    [provider]  Zinc Gluconate 100 MG TABS Take 100 mg by mouth every morning.    [provider]      Allergies    Ace inhibitors, Amiodarone, Tikosyn [dofetilide], and Meperidine and related    Review of Systems   Review of Systems  Constitutional:  Negative for appetite change and fatigue.  HENT:  Negative for congestion, ear discharge and sinus pressure.   Eyes:  Negative for discharge.  Respiratory:  Negative for cough.   Cardiovascular:  Negative for chest pain.  Gastrointestinal:  Negative for abdominal pain  and diarrhea.  Genitourinary:  Negative for frequency and hematuria.  Musculoskeletal:  Negative for back pain.  Skin:  Negative for rash.  Neurological:  Negative for seizures and headaches.       Minimal confusion  Psychiatric/Behavioral:  Negative for hallucinations.    Physical Exam Updated Vital Signs BP (!) 189/118   Pulse 98   Temp (!) 97.4 F (36.3 C) (Oral)   Resp 11   Ht 5' (1.524 m)   Wt 54.4 kg   SpO2 95%   BMI 23.44 kg/m  Physical Exam Vitals and nursing note reviewed.  Constitutional:      Appearance: She is  well-developed.  HENT:     Head: Normocephalic.     Mouth/Throat:     Mouth: Mucous membranes are moist.  Eyes:     General: No scleral icterus.    Conjunctiva/sclera: Conjunctivae normal.  Neck:     Thyroid: No thyromegaly.  Cardiovascular:     Rate and Rhythm: Normal rate and regular rhythm.     Heart sounds: No murmur heard.   No friction rub. No gallop.  Pulmonary:     Breath sounds: No stridor. No wheezing or rales.  Chest:     Chest wall: No tenderness.  Abdominal:     General: There is no distension.     Tenderness: There is no abdominal tenderness. There is no rebound.  Musculoskeletal:        General: Normal range of motion.     Cervical back: Neck supple.  Lymphadenopathy:     Cervical: No cervical adenopathy.  Skin:    Findings: No erythema or rash.  Neurological:     Mental Status: She is alert and oriented to person, place, and time.     Motor: No abnormal muscle tone.     Coordination: Coordination normal.  Psychiatric:        Behavior: Behavior normal.    ED Results / Procedures / Treatments   Labs (all labs ordered are listed, but only abnormal results are displayed) Labs Reviewed  CBC WITH DIFFERENTIAL/PLATELET - Abnormal; Notable for the following components:      Result Value   Monocytes Absolute 1.1 (*)    All other components within normal limits  COMPREHENSIVE METABOLIC PANEL - Abnormal; Notable for the following components:   Sodium 133 (*)    Glucose, Bld 107 (*)    All other components within normal limits  URINALYSIS, ROUTINE W REFLEX MICROSCOPIC - Abnormal; Notable for the following components:   Protein, ur 30 (*)    Bacteria, UA MANY (*)    All other components within normal limits  URINE CULTURE    EKG None  Radiology CT Head Wo Contrast  Result Date: 09/09/2021 CLINICAL DATA:  Dizziness, persistent/recurrent, cardiac or vascular cause suspected EXAM: CT HEAD WITHOUT CONTRAST TECHNIQUE: Contiguous axial images were obtained  from the base of the skull through the vertex without intravenous contrast. RADIATION DOSE REDUCTION: This exam was performed according to the departmental dose-optimization program which includes automated exposure control, adjustment of the mA and/or kV according to patient size and/or use of iterative reconstruction technique. COMPARISON:  CT head 02/14/2017 BRAIN: BRAIN Cerebral ventricle sizes are concordant with the degree of cerebral volume loss. Patchy and confluent areas of decreased attenuation are noted throughout the deep and periventricular white matter of the cerebral hemispheres bilaterally, compatible with chronic microvascular ischemic disease. No evidence of large-territorial acute infarction. No parenchymal hemorrhage. No mass lesion. No extra-axial collection. No  mass effect or midline shift. No hydrocephalus. Basilar cisterns are patent. Vascular: No hyperdense vessel. Atherosclerotic calcifications are present within the cavernous internal carotid and vertebral arteries. Skull: No acute fracture or focal lesion. Sinuses/Orbits: Paranasal sinuses and mastoid air cells are clear. The orbits are unremarkable. Other: None. IMPRESSION: No acute intracranial abnormality. Electronically Signed   By: Tish Frederickson M.D.   On: 09/09/2021 16:16    Procedures Procedures    Medications Ordered in ED Medications  irbesartan (AVAPRO) tablet 75 mg (has no administration in time range)  sulfamethoxazole-trimethoprim (BACTRIM DS) 800-160 MG per tablet 1 tablet (has no administration in time range)  sodium chloride 0.9 % bolus 250 mL (0 mLs Intravenous Stopped 09/09/21 1709)    ED Course/ Medical Decision Making/ A&P                           Medical Decision Making Amount and/or Complexity of Data Reviewed Labs: ordered. Radiology: ordered. ECG/medicine tests: ordered.  Risk Prescription drug management.  This patient presents to the ED for concern of hypertension, this involves an  extensive number of treatment options, and is a complaint that carries with it a high risk of complications and morbidity.  The differential diagnosis includes poorly controlled blood pressure   Co morbidities that complicate the patient evaluation  Hypertension and dementia   Additional history obtained:  Additional history obtained from  EMS External records from outside source obtained and reviewed including hospital records   Lab Tests:  I Ordered, and personally interpreted labs.  The pertinent results include: CBC and chemistries unremarkable, urinalysis shows many bacteria   Imaging Studies ordered:  I ordered imaging studies including CT head I independently visualized and interpreted imaging which showed no acute disease I agree with the radiologist interpretation   Cardiac Monitoring: / EKG:  The patient was maintained on a cardiac monitor.  I personally viewed and interpreted the cardiac monitored which showed an underlying rhythm of: Normal sinus rhythm   Consultations Obtained:  No consult Problem List / ED Course / Critical interventions / Medication management  Poorly controlled blood pressure and possible UTI I ordered medication including Diovan for blood pressure and Bactrim for UTI Reevaluation of the patient after these medicines showed that the patient improved I have reviewed the patients home medicines and have made adjustments as needed   Social Determinants of Health:  Lives at home with a caregiver   Test / Admission - Considered:  Urine culture pending.  Patient does not meet admission criteria  Patient with poorly controlled blood pressure and possible urinary tract infection.  She will be started on Bactrim and get a urine culture done and her Diovan will be increased from 80 mg a day to 160        Final Clinical Impression(s) / ED Diagnoses Final diagnoses:  Primary hypertension  Acute cystitis without hematuria    Rx / DC  Orders ED Discharge Orders          Ordered    sulfamethoxazole-trimethoprim (BACTRIM DS) 800-160 MG tablet  2 times daily        09/09/21 1856              Bethann Berkshire, MD 09/10/21 1009

## 2021-09-09 NOTE — ED Notes (Signed)
Roderic Palau, MD notified re: pts BP 187/113

## 2021-09-09 NOTE — Discharge Instructions (Signed)
Increase the Diovan so you are taking 160 mg a day and follow-up with your family doctor later this week

## 2021-09-11 LAB — URINE CULTURE

## 2021-09-17 DIAGNOSIS — Z6823 Body mass index (BMI) 23.0-23.9, adult: Secondary | ICD-10-CM | POA: Diagnosis not present

## 2021-09-17 DIAGNOSIS — K5900 Constipation, unspecified: Secondary | ICD-10-CM | POA: Diagnosis not present

## 2021-09-17 DIAGNOSIS — I10 Essential (primary) hypertension: Secondary | ICD-10-CM | POA: Diagnosis not present

## 2021-09-17 DIAGNOSIS — I509 Heart failure, unspecified: Secondary | ICD-10-CM | POA: Diagnosis not present

## 2021-09-17 DIAGNOSIS — R350 Frequency of micturition: Secondary | ICD-10-CM | POA: Diagnosis not present

## 2021-09-17 DIAGNOSIS — Z299 Encounter for prophylactic measures, unspecified: Secondary | ICD-10-CM | POA: Diagnosis not present

## 2021-09-18 DIAGNOSIS — H9193 Unspecified hearing loss, bilateral: Secondary | ICD-10-CM | POA: Diagnosis not present

## 2021-09-18 DIAGNOSIS — N183 Chronic kidney disease, stage 3 unspecified: Secondary | ICD-10-CM | POA: Diagnosis not present

## 2021-09-18 DIAGNOSIS — N189 Chronic kidney disease, unspecified: Secondary | ICD-10-CM | POA: Diagnosis not present

## 2021-09-18 DIAGNOSIS — Z66 Do not resuscitate: Secondary | ICD-10-CM | POA: Diagnosis not present

## 2021-09-18 DIAGNOSIS — I10 Essential (primary) hypertension: Secondary | ICD-10-CM | POA: Diagnosis not present

## 2021-09-18 DIAGNOSIS — I6523 Occlusion and stenosis of bilateral carotid arteries: Secondary | ICD-10-CM | POA: Diagnosis not present

## 2021-09-18 DIAGNOSIS — R531 Weakness: Secondary | ICD-10-CM | POA: Diagnosis not present

## 2021-09-18 DIAGNOSIS — I130 Hypertensive heart and chronic kidney disease with heart failure and stage 1 through stage 4 chronic kidney disease, or unspecified chronic kidney disease: Secondary | ICD-10-CM | POA: Diagnosis not present

## 2021-09-18 DIAGNOSIS — I509 Heart failure, unspecified: Secondary | ICD-10-CM | POA: Diagnosis not present

## 2021-09-18 DIAGNOSIS — E7800 Pure hypercholesterolemia, unspecified: Secondary | ICD-10-CM | POA: Diagnosis not present

## 2021-09-18 DIAGNOSIS — I480 Paroxysmal atrial fibrillation: Secondary | ICD-10-CM | POA: Diagnosis not present

## 2021-09-18 DIAGNOSIS — Z743 Need for continuous supervision: Secondary | ICD-10-CM | POA: Diagnosis not present

## 2021-09-18 DIAGNOSIS — R410 Disorientation, unspecified: Secondary | ICD-10-CM | POA: Diagnosis not present

## 2021-09-18 DIAGNOSIS — Z950 Presence of cardiac pacemaker: Secondary | ICD-10-CM | POA: Diagnosis not present

## 2021-09-18 DIAGNOSIS — Z7901 Long term (current) use of anticoagulants: Secondary | ICD-10-CM | POA: Diagnosis not present

## 2021-09-18 DIAGNOSIS — I639 Cerebral infarction, unspecified: Secondary | ICD-10-CM | POA: Diagnosis not present

## 2021-09-18 DIAGNOSIS — E871 Hypo-osmolality and hyponatremia: Secondary | ICD-10-CM | POA: Diagnosis not present

## 2021-09-18 DIAGNOSIS — Z8679 Personal history of other diseases of the circulatory system: Secondary | ICD-10-CM | POA: Diagnosis not present

## 2021-09-18 DIAGNOSIS — R079 Chest pain, unspecified: Secondary | ICD-10-CM | POA: Diagnosis not present

## 2021-09-18 DIAGNOSIS — Z79899 Other long term (current) drug therapy: Secondary | ICD-10-CM | POA: Diagnosis not present

## 2021-09-18 DIAGNOSIS — G459 Transient cerebral ischemic attack, unspecified: Secondary | ICD-10-CM | POA: Diagnosis not present

## 2021-09-18 DIAGNOSIS — Z20822 Contact with and (suspected) exposure to covid-19: Secondary | ICD-10-CM | POA: Diagnosis not present

## 2021-09-18 DIAGNOSIS — E860 Dehydration: Secondary | ICD-10-CM | POA: Diagnosis not present

## 2021-09-18 DIAGNOSIS — I6503 Occlusion and stenosis of bilateral vertebral arteries: Secondary | ICD-10-CM | POA: Diagnosis not present

## 2021-09-18 DIAGNOSIS — I4891 Unspecified atrial fibrillation: Secondary | ICD-10-CM | POA: Diagnosis not present

## 2021-09-18 DIAGNOSIS — Z7982 Long term (current) use of aspirin: Secondary | ICD-10-CM | POA: Diagnosis not present

## 2021-09-18 DIAGNOSIS — N179 Acute kidney failure, unspecified: Secondary | ICD-10-CM | POA: Diagnosis not present

## 2021-09-25 DIAGNOSIS — I130 Hypertensive heart and chronic kidney disease with heart failure and stage 1 through stage 4 chronic kidney disease, or unspecified chronic kidney disease: Secondary | ICD-10-CM | POA: Diagnosis not present

## 2021-09-25 DIAGNOSIS — R413 Other amnesia: Secondary | ICD-10-CM | POA: Diagnosis not present

## 2021-09-25 DIAGNOSIS — Z8673 Personal history of transient ischemic attack (TIA), and cerebral infarction without residual deficits: Secondary | ICD-10-CM | POA: Diagnosis not present

## 2021-09-25 DIAGNOSIS — E871 Hypo-osmolality and hyponatremia: Secondary | ICD-10-CM | POA: Diagnosis not present

## 2021-09-25 DIAGNOSIS — F32A Depression, unspecified: Secondary | ICD-10-CM | POA: Diagnosis not present

## 2021-09-25 DIAGNOSIS — N179 Acute kidney failure, unspecified: Secondary | ICD-10-CM | POA: Diagnosis not present

## 2021-09-25 DIAGNOSIS — N183 Chronic kidney disease, stage 3 unspecified: Secondary | ICD-10-CM | POA: Diagnosis not present

## 2021-09-25 DIAGNOSIS — Z7901 Long term (current) use of anticoagulants: Secondary | ICD-10-CM | POA: Diagnosis not present

## 2021-09-25 DIAGNOSIS — E7800 Pure hypercholesterolemia, unspecified: Secondary | ICD-10-CM | POA: Diagnosis not present

## 2021-09-25 DIAGNOSIS — K219 Gastro-esophageal reflux disease without esophagitis: Secondary | ICD-10-CM | POA: Diagnosis not present

## 2021-09-25 DIAGNOSIS — I480 Paroxysmal atrial fibrillation: Secondary | ICD-10-CM | POA: Diagnosis not present

## 2021-09-25 DIAGNOSIS — Z7982 Long term (current) use of aspirin: Secondary | ICD-10-CM | POA: Diagnosis not present

## 2021-09-25 DIAGNOSIS — I509 Heart failure, unspecified: Secondary | ICD-10-CM | POA: Diagnosis not present

## 2021-09-30 DIAGNOSIS — I700 Atherosclerosis of aorta: Secondary | ICD-10-CM | POA: Diagnosis not present

## 2021-09-30 DIAGNOSIS — G459 Transient cerebral ischemic attack, unspecified: Secondary | ICD-10-CM | POA: Diagnosis not present

## 2021-09-30 DIAGNOSIS — I4891 Unspecified atrial fibrillation: Secondary | ICD-10-CM | POA: Diagnosis not present

## 2021-09-30 DIAGNOSIS — Z299 Encounter for prophylactic measures, unspecified: Secondary | ICD-10-CM | POA: Diagnosis not present

## 2021-09-30 DIAGNOSIS — I10 Essential (primary) hypertension: Secondary | ICD-10-CM | POA: Diagnosis not present

## 2021-10-01 DIAGNOSIS — E871 Hypo-osmolality and hyponatremia: Secondary | ICD-10-CM | POA: Diagnosis not present

## 2021-10-01 DIAGNOSIS — I130 Hypertensive heart and chronic kidney disease with heart failure and stage 1 through stage 4 chronic kidney disease, or unspecified chronic kidney disease: Secondary | ICD-10-CM | POA: Diagnosis not present

## 2021-10-01 DIAGNOSIS — Z8673 Personal history of transient ischemic attack (TIA), and cerebral infarction without residual deficits: Secondary | ICD-10-CM | POA: Diagnosis not present

## 2021-10-01 DIAGNOSIS — Z7901 Long term (current) use of anticoagulants: Secondary | ICD-10-CM | POA: Diagnosis not present

## 2021-10-01 DIAGNOSIS — R413 Other amnesia: Secondary | ICD-10-CM | POA: Diagnosis not present

## 2021-10-01 DIAGNOSIS — E7800 Pure hypercholesterolemia, unspecified: Secondary | ICD-10-CM | POA: Diagnosis not present

## 2021-10-01 DIAGNOSIS — I509 Heart failure, unspecified: Secondary | ICD-10-CM | POA: Diagnosis not present

## 2021-10-01 DIAGNOSIS — F32A Depression, unspecified: Secondary | ICD-10-CM | POA: Diagnosis not present

## 2021-10-01 DIAGNOSIS — N179 Acute kidney failure, unspecified: Secondary | ICD-10-CM | POA: Diagnosis not present

## 2021-10-01 DIAGNOSIS — K219 Gastro-esophageal reflux disease without esophagitis: Secondary | ICD-10-CM | POA: Diagnosis not present

## 2021-10-01 DIAGNOSIS — N183 Chronic kidney disease, stage 3 unspecified: Secondary | ICD-10-CM | POA: Diagnosis not present

## 2021-10-01 DIAGNOSIS — I480 Paroxysmal atrial fibrillation: Secondary | ICD-10-CM | POA: Diagnosis not present

## 2021-10-01 DIAGNOSIS — Z7982 Long term (current) use of aspirin: Secondary | ICD-10-CM | POA: Diagnosis not present

## 2021-10-04 DIAGNOSIS — I10 Essential (primary) hypertension: Secondary | ICD-10-CM | POA: Diagnosis not present

## 2021-10-04 DIAGNOSIS — N183 Chronic kidney disease, stage 3 unspecified: Secondary | ICD-10-CM | POA: Diagnosis not present

## 2021-10-04 DIAGNOSIS — I130 Hypertensive heart and chronic kidney disease with heart failure and stage 1 through stage 4 chronic kidney disease, or unspecified chronic kidney disease: Secondary | ICD-10-CM | POA: Diagnosis not present

## 2021-10-04 DIAGNOSIS — Z7982 Long term (current) use of aspirin: Secondary | ICD-10-CM | POA: Diagnosis not present

## 2021-10-04 DIAGNOSIS — N179 Acute kidney failure, unspecified: Secondary | ICD-10-CM | POA: Diagnosis not present

## 2021-10-04 DIAGNOSIS — I480 Paroxysmal atrial fibrillation: Secondary | ICD-10-CM | POA: Diagnosis not present

## 2021-10-04 DIAGNOSIS — Z8673 Personal history of transient ischemic attack (TIA), and cerebral infarction without residual deficits: Secondary | ICD-10-CM | POA: Diagnosis not present

## 2021-10-04 DIAGNOSIS — I509 Heart failure, unspecified: Secondary | ICD-10-CM | POA: Diagnosis not present

## 2021-10-04 DIAGNOSIS — F32A Depression, unspecified: Secondary | ICD-10-CM | POA: Diagnosis not present

## 2021-10-04 DIAGNOSIS — R413 Other amnesia: Secondary | ICD-10-CM | POA: Diagnosis not present

## 2021-10-04 DIAGNOSIS — K219 Gastro-esophageal reflux disease without esophagitis: Secondary | ICD-10-CM | POA: Diagnosis not present

## 2021-10-04 DIAGNOSIS — Z7901 Long term (current) use of anticoagulants: Secondary | ICD-10-CM | POA: Diagnosis not present

## 2021-10-04 DIAGNOSIS — E7800 Pure hypercholesterolemia, unspecified: Secondary | ICD-10-CM | POA: Diagnosis not present

## 2021-10-04 DIAGNOSIS — E871 Hypo-osmolality and hyponatremia: Secondary | ICD-10-CM | POA: Diagnosis not present

## 2021-10-07 DIAGNOSIS — N183 Chronic kidney disease, stage 3 unspecified: Secondary | ICD-10-CM | POA: Diagnosis not present

## 2021-10-07 DIAGNOSIS — N179 Acute kidney failure, unspecified: Secondary | ICD-10-CM | POA: Diagnosis not present

## 2021-10-07 DIAGNOSIS — Z7982 Long term (current) use of aspirin: Secondary | ICD-10-CM | POA: Diagnosis not present

## 2021-10-07 DIAGNOSIS — I480 Paroxysmal atrial fibrillation: Secondary | ICD-10-CM | POA: Diagnosis not present

## 2021-10-07 DIAGNOSIS — R413 Other amnesia: Secondary | ICD-10-CM | POA: Diagnosis not present

## 2021-10-07 DIAGNOSIS — Z8673 Personal history of transient ischemic attack (TIA), and cerebral infarction without residual deficits: Secondary | ICD-10-CM | POA: Diagnosis not present

## 2021-10-07 DIAGNOSIS — I130 Hypertensive heart and chronic kidney disease with heart failure and stage 1 through stage 4 chronic kidney disease, or unspecified chronic kidney disease: Secondary | ICD-10-CM | POA: Diagnosis not present

## 2021-10-07 DIAGNOSIS — I509 Heart failure, unspecified: Secondary | ICD-10-CM | POA: Diagnosis not present

## 2021-10-07 DIAGNOSIS — E871 Hypo-osmolality and hyponatremia: Secondary | ICD-10-CM | POA: Diagnosis not present

## 2021-10-07 DIAGNOSIS — F32A Depression, unspecified: Secondary | ICD-10-CM | POA: Diagnosis not present

## 2021-10-07 DIAGNOSIS — K219 Gastro-esophageal reflux disease without esophagitis: Secondary | ICD-10-CM | POA: Diagnosis not present

## 2021-10-07 DIAGNOSIS — E7800 Pure hypercholesterolemia, unspecified: Secondary | ICD-10-CM | POA: Diagnosis not present

## 2021-10-07 DIAGNOSIS — Z7901 Long term (current) use of anticoagulants: Secondary | ICD-10-CM | POA: Diagnosis not present

## 2021-10-10 DIAGNOSIS — I480 Paroxysmal atrial fibrillation: Secondary | ICD-10-CM | POA: Diagnosis not present

## 2021-10-10 DIAGNOSIS — Z7982 Long term (current) use of aspirin: Secondary | ICD-10-CM | POA: Diagnosis not present

## 2021-10-10 DIAGNOSIS — E871 Hypo-osmolality and hyponatremia: Secondary | ICD-10-CM | POA: Diagnosis not present

## 2021-10-10 DIAGNOSIS — Z7901 Long term (current) use of anticoagulants: Secondary | ICD-10-CM | POA: Diagnosis not present

## 2021-10-10 DIAGNOSIS — F32A Depression, unspecified: Secondary | ICD-10-CM | POA: Diagnosis not present

## 2021-10-10 DIAGNOSIS — K219 Gastro-esophageal reflux disease without esophagitis: Secondary | ICD-10-CM | POA: Diagnosis not present

## 2021-10-10 DIAGNOSIS — R413 Other amnesia: Secondary | ICD-10-CM | POA: Diagnosis not present

## 2021-10-10 DIAGNOSIS — N179 Acute kidney failure, unspecified: Secondary | ICD-10-CM | POA: Diagnosis not present

## 2021-10-10 DIAGNOSIS — E7800 Pure hypercholesterolemia, unspecified: Secondary | ICD-10-CM | POA: Diagnosis not present

## 2021-10-10 DIAGNOSIS — I130 Hypertensive heart and chronic kidney disease with heart failure and stage 1 through stage 4 chronic kidney disease, or unspecified chronic kidney disease: Secondary | ICD-10-CM | POA: Diagnosis not present

## 2021-10-10 DIAGNOSIS — I509 Heart failure, unspecified: Secondary | ICD-10-CM | POA: Diagnosis not present

## 2021-10-10 DIAGNOSIS — Z8673 Personal history of transient ischemic attack (TIA), and cerebral infarction without residual deficits: Secondary | ICD-10-CM | POA: Diagnosis not present

## 2021-10-10 DIAGNOSIS — N183 Chronic kidney disease, stage 3 unspecified: Secondary | ICD-10-CM | POA: Diagnosis not present

## 2021-10-14 DIAGNOSIS — E7800 Pure hypercholesterolemia, unspecified: Secondary | ICD-10-CM | POA: Diagnosis not present

## 2021-10-14 DIAGNOSIS — R413 Other amnesia: Secondary | ICD-10-CM | POA: Diagnosis not present

## 2021-10-14 DIAGNOSIS — N179 Acute kidney failure, unspecified: Secondary | ICD-10-CM | POA: Diagnosis not present

## 2021-10-14 DIAGNOSIS — F32A Depression, unspecified: Secondary | ICD-10-CM | POA: Diagnosis not present

## 2021-10-14 DIAGNOSIS — Z7901 Long term (current) use of anticoagulants: Secondary | ICD-10-CM | POA: Diagnosis not present

## 2021-10-14 DIAGNOSIS — Z299 Encounter for prophylactic measures, unspecified: Secondary | ICD-10-CM | POA: Diagnosis not present

## 2021-10-14 DIAGNOSIS — N183 Chronic kidney disease, stage 3 unspecified: Secondary | ICD-10-CM | POA: Diagnosis not present

## 2021-10-14 DIAGNOSIS — E871 Hypo-osmolality and hyponatremia: Secondary | ICD-10-CM | POA: Diagnosis not present

## 2021-10-14 DIAGNOSIS — Z7189 Other specified counseling: Secondary | ICD-10-CM | POA: Diagnosis not present

## 2021-10-14 DIAGNOSIS — K219 Gastro-esophageal reflux disease without esophagitis: Secondary | ICD-10-CM | POA: Diagnosis not present

## 2021-10-14 DIAGNOSIS — Z789 Other specified health status: Secondary | ICD-10-CM | POA: Diagnosis not present

## 2021-10-14 DIAGNOSIS — I10 Essential (primary) hypertension: Secondary | ICD-10-CM | POA: Diagnosis not present

## 2021-10-14 DIAGNOSIS — Z6823 Body mass index (BMI) 23.0-23.9, adult: Secondary | ICD-10-CM | POA: Diagnosis not present

## 2021-10-14 DIAGNOSIS — Z0000 Encounter for general adult medical examination without abnormal findings: Secondary | ICD-10-CM | POA: Diagnosis not present

## 2021-10-14 DIAGNOSIS — I130 Hypertensive heart and chronic kidney disease with heart failure and stage 1 through stage 4 chronic kidney disease, or unspecified chronic kidney disease: Secondary | ICD-10-CM | POA: Diagnosis not present

## 2021-10-14 DIAGNOSIS — I509 Heart failure, unspecified: Secondary | ICD-10-CM | POA: Diagnosis not present

## 2021-10-14 DIAGNOSIS — Z7982 Long term (current) use of aspirin: Secondary | ICD-10-CM | POA: Diagnosis not present

## 2021-10-14 DIAGNOSIS — Z8673 Personal history of transient ischemic attack (TIA), and cerebral infarction without residual deficits: Secondary | ICD-10-CM | POA: Diagnosis not present

## 2021-10-14 DIAGNOSIS — I480 Paroxysmal atrial fibrillation: Secondary | ICD-10-CM | POA: Diagnosis not present

## 2021-10-17 DIAGNOSIS — N183 Chronic kidney disease, stage 3 unspecified: Secondary | ICD-10-CM | POA: Diagnosis not present

## 2021-10-17 DIAGNOSIS — I130 Hypertensive heart and chronic kidney disease with heart failure and stage 1 through stage 4 chronic kidney disease, or unspecified chronic kidney disease: Secondary | ICD-10-CM | POA: Diagnosis not present

## 2021-10-17 DIAGNOSIS — F32A Depression, unspecified: Secondary | ICD-10-CM | POA: Diagnosis not present

## 2021-10-17 DIAGNOSIS — I509 Heart failure, unspecified: Secondary | ICD-10-CM | POA: Diagnosis not present

## 2021-10-17 DIAGNOSIS — Z7901 Long term (current) use of anticoagulants: Secondary | ICD-10-CM | POA: Diagnosis not present

## 2021-10-17 DIAGNOSIS — I480 Paroxysmal atrial fibrillation: Secondary | ICD-10-CM | POA: Diagnosis not present

## 2021-10-17 DIAGNOSIS — N179 Acute kidney failure, unspecified: Secondary | ICD-10-CM | POA: Diagnosis not present

## 2021-10-17 DIAGNOSIS — Z7982 Long term (current) use of aspirin: Secondary | ICD-10-CM | POA: Diagnosis not present

## 2021-10-17 DIAGNOSIS — R413 Other amnesia: Secondary | ICD-10-CM | POA: Diagnosis not present

## 2021-10-17 DIAGNOSIS — Z8673 Personal history of transient ischemic attack (TIA), and cerebral infarction without residual deficits: Secondary | ICD-10-CM | POA: Diagnosis not present

## 2021-10-17 DIAGNOSIS — K219 Gastro-esophageal reflux disease without esophagitis: Secondary | ICD-10-CM | POA: Diagnosis not present

## 2021-10-17 DIAGNOSIS — E871 Hypo-osmolality and hyponatremia: Secondary | ICD-10-CM | POA: Diagnosis not present

## 2021-10-17 DIAGNOSIS — E7800 Pure hypercholesterolemia, unspecified: Secondary | ICD-10-CM | POA: Diagnosis not present

## 2021-10-21 DIAGNOSIS — Z7901 Long term (current) use of anticoagulants: Secondary | ICD-10-CM | POA: Diagnosis not present

## 2021-10-21 DIAGNOSIS — E7800 Pure hypercholesterolemia, unspecified: Secondary | ICD-10-CM | POA: Diagnosis not present

## 2021-10-21 DIAGNOSIS — N183 Chronic kidney disease, stage 3 unspecified: Secondary | ICD-10-CM | POA: Diagnosis not present

## 2021-10-21 DIAGNOSIS — I480 Paroxysmal atrial fibrillation: Secondary | ICD-10-CM | POA: Diagnosis not present

## 2021-10-21 DIAGNOSIS — R413 Other amnesia: Secondary | ICD-10-CM | POA: Diagnosis not present

## 2021-10-21 DIAGNOSIS — N179 Acute kidney failure, unspecified: Secondary | ICD-10-CM | POA: Diagnosis not present

## 2021-10-21 DIAGNOSIS — I130 Hypertensive heart and chronic kidney disease with heart failure and stage 1 through stage 4 chronic kidney disease, or unspecified chronic kidney disease: Secondary | ICD-10-CM | POA: Diagnosis not present

## 2021-10-21 DIAGNOSIS — Z7982 Long term (current) use of aspirin: Secondary | ICD-10-CM | POA: Diagnosis not present

## 2021-10-21 DIAGNOSIS — F32A Depression, unspecified: Secondary | ICD-10-CM | POA: Diagnosis not present

## 2021-10-21 DIAGNOSIS — Z8673 Personal history of transient ischemic attack (TIA), and cerebral infarction without residual deficits: Secondary | ICD-10-CM | POA: Diagnosis not present

## 2021-10-21 DIAGNOSIS — K219 Gastro-esophageal reflux disease without esophagitis: Secondary | ICD-10-CM | POA: Diagnosis not present

## 2021-10-21 DIAGNOSIS — E871 Hypo-osmolality and hyponatremia: Secondary | ICD-10-CM | POA: Diagnosis not present

## 2021-10-21 DIAGNOSIS — I509 Heart failure, unspecified: Secondary | ICD-10-CM | POA: Diagnosis not present

## 2021-10-22 DIAGNOSIS — I509 Heart failure, unspecified: Secondary | ICD-10-CM | POA: Diagnosis not present

## 2021-10-22 DIAGNOSIS — Z7982 Long term (current) use of aspirin: Secondary | ICD-10-CM | POA: Diagnosis not present

## 2021-10-22 DIAGNOSIS — Z8673 Personal history of transient ischemic attack (TIA), and cerebral infarction without residual deficits: Secondary | ICD-10-CM | POA: Diagnosis not present

## 2021-10-22 DIAGNOSIS — I480 Paroxysmal atrial fibrillation: Secondary | ICD-10-CM | POA: Diagnosis not present

## 2021-10-22 DIAGNOSIS — K219 Gastro-esophageal reflux disease without esophagitis: Secondary | ICD-10-CM | POA: Diagnosis not present

## 2021-10-22 DIAGNOSIS — N183 Chronic kidney disease, stage 3 unspecified: Secondary | ICD-10-CM | POA: Diagnosis not present

## 2021-10-22 DIAGNOSIS — I130 Hypertensive heart and chronic kidney disease with heart failure and stage 1 through stage 4 chronic kidney disease, or unspecified chronic kidney disease: Secondary | ICD-10-CM | POA: Diagnosis not present

## 2021-10-22 DIAGNOSIS — Z7901 Long term (current) use of anticoagulants: Secondary | ICD-10-CM | POA: Diagnosis not present

## 2021-10-22 DIAGNOSIS — N179 Acute kidney failure, unspecified: Secondary | ICD-10-CM | POA: Diagnosis not present

## 2021-10-22 DIAGNOSIS — R413 Other amnesia: Secondary | ICD-10-CM | POA: Diagnosis not present

## 2021-10-22 DIAGNOSIS — E871 Hypo-osmolality and hyponatremia: Secondary | ICD-10-CM | POA: Diagnosis not present

## 2021-10-22 DIAGNOSIS — F32A Depression, unspecified: Secondary | ICD-10-CM | POA: Diagnosis not present

## 2021-10-22 DIAGNOSIS — E7800 Pure hypercholesterolemia, unspecified: Secondary | ICD-10-CM | POA: Diagnosis not present

## 2021-10-24 DIAGNOSIS — F32A Depression, unspecified: Secondary | ICD-10-CM | POA: Diagnosis not present

## 2021-10-24 DIAGNOSIS — N183 Chronic kidney disease, stage 3 unspecified: Secondary | ICD-10-CM | POA: Diagnosis not present

## 2021-10-24 DIAGNOSIS — N179 Acute kidney failure, unspecified: Secondary | ICD-10-CM | POA: Diagnosis not present

## 2021-10-24 DIAGNOSIS — Z7901 Long term (current) use of anticoagulants: Secondary | ICD-10-CM | POA: Diagnosis not present

## 2021-10-24 DIAGNOSIS — I480 Paroxysmal atrial fibrillation: Secondary | ICD-10-CM | POA: Diagnosis not present

## 2021-10-24 DIAGNOSIS — K219 Gastro-esophageal reflux disease without esophagitis: Secondary | ICD-10-CM | POA: Diagnosis not present

## 2021-10-24 DIAGNOSIS — E871 Hypo-osmolality and hyponatremia: Secondary | ICD-10-CM | POA: Diagnosis not present

## 2021-10-24 DIAGNOSIS — Z8673 Personal history of transient ischemic attack (TIA), and cerebral infarction without residual deficits: Secondary | ICD-10-CM | POA: Diagnosis not present

## 2021-10-24 DIAGNOSIS — I130 Hypertensive heart and chronic kidney disease with heart failure and stage 1 through stage 4 chronic kidney disease, or unspecified chronic kidney disease: Secondary | ICD-10-CM | POA: Diagnosis not present

## 2021-10-24 DIAGNOSIS — Z7982 Long term (current) use of aspirin: Secondary | ICD-10-CM | POA: Diagnosis not present

## 2021-10-24 DIAGNOSIS — E7800 Pure hypercholesterolemia, unspecified: Secondary | ICD-10-CM | POA: Diagnosis not present

## 2021-10-24 DIAGNOSIS — I509 Heart failure, unspecified: Secondary | ICD-10-CM | POA: Diagnosis not present

## 2021-10-24 DIAGNOSIS — R413 Other amnesia: Secondary | ICD-10-CM | POA: Diagnosis not present

## 2021-10-28 DIAGNOSIS — E7800 Pure hypercholesterolemia, unspecified: Secondary | ICD-10-CM | POA: Diagnosis not present

## 2021-10-28 DIAGNOSIS — Z7901 Long term (current) use of anticoagulants: Secondary | ICD-10-CM | POA: Diagnosis not present

## 2021-10-28 DIAGNOSIS — E871 Hypo-osmolality and hyponatremia: Secondary | ICD-10-CM | POA: Diagnosis not present

## 2021-10-28 DIAGNOSIS — F32A Depression, unspecified: Secondary | ICD-10-CM | POA: Diagnosis not present

## 2021-10-28 DIAGNOSIS — I480 Paroxysmal atrial fibrillation: Secondary | ICD-10-CM | POA: Diagnosis not present

## 2021-10-28 DIAGNOSIS — R413 Other amnesia: Secondary | ICD-10-CM | POA: Diagnosis not present

## 2021-10-28 DIAGNOSIS — I509 Heart failure, unspecified: Secondary | ICD-10-CM | POA: Diagnosis not present

## 2021-10-28 DIAGNOSIS — N179 Acute kidney failure, unspecified: Secondary | ICD-10-CM | POA: Diagnosis not present

## 2021-10-28 DIAGNOSIS — I130 Hypertensive heart and chronic kidney disease with heart failure and stage 1 through stage 4 chronic kidney disease, or unspecified chronic kidney disease: Secondary | ICD-10-CM | POA: Diagnosis not present

## 2021-10-28 DIAGNOSIS — Z7982 Long term (current) use of aspirin: Secondary | ICD-10-CM | POA: Diagnosis not present

## 2021-10-28 DIAGNOSIS — N183 Chronic kidney disease, stage 3 unspecified: Secondary | ICD-10-CM | POA: Diagnosis not present

## 2021-10-28 DIAGNOSIS — K219 Gastro-esophageal reflux disease without esophagitis: Secondary | ICD-10-CM | POA: Diagnosis not present

## 2021-10-28 DIAGNOSIS — Z8673 Personal history of transient ischemic attack (TIA), and cerebral infarction without residual deficits: Secondary | ICD-10-CM | POA: Diagnosis not present

## 2021-10-30 DIAGNOSIS — Z7982 Long term (current) use of aspirin: Secondary | ICD-10-CM | POA: Diagnosis not present

## 2021-10-30 DIAGNOSIS — I130 Hypertensive heart and chronic kidney disease with heart failure and stage 1 through stage 4 chronic kidney disease, or unspecified chronic kidney disease: Secondary | ICD-10-CM | POA: Diagnosis not present

## 2021-10-30 DIAGNOSIS — F32A Depression, unspecified: Secondary | ICD-10-CM | POA: Diagnosis not present

## 2021-10-30 DIAGNOSIS — E7800 Pure hypercholesterolemia, unspecified: Secondary | ICD-10-CM | POA: Diagnosis not present

## 2021-10-30 DIAGNOSIS — Z7901 Long term (current) use of anticoagulants: Secondary | ICD-10-CM | POA: Diagnosis not present

## 2021-10-30 DIAGNOSIS — I509 Heart failure, unspecified: Secondary | ICD-10-CM | POA: Diagnosis not present

## 2021-10-30 DIAGNOSIS — I480 Paroxysmal atrial fibrillation: Secondary | ICD-10-CM | POA: Diagnosis not present

## 2021-10-30 DIAGNOSIS — N179 Acute kidney failure, unspecified: Secondary | ICD-10-CM | POA: Diagnosis not present

## 2021-10-30 DIAGNOSIS — E871 Hypo-osmolality and hyponatremia: Secondary | ICD-10-CM | POA: Diagnosis not present

## 2021-10-30 DIAGNOSIS — K219 Gastro-esophageal reflux disease without esophagitis: Secondary | ICD-10-CM | POA: Diagnosis not present

## 2021-10-30 DIAGNOSIS — R413 Other amnesia: Secondary | ICD-10-CM | POA: Diagnosis not present

## 2021-10-30 DIAGNOSIS — Z8673 Personal history of transient ischemic attack (TIA), and cerebral infarction without residual deficits: Secondary | ICD-10-CM | POA: Diagnosis not present

## 2021-10-30 DIAGNOSIS — N183 Chronic kidney disease, stage 3 unspecified: Secondary | ICD-10-CM | POA: Diagnosis not present

## 2021-11-01 DIAGNOSIS — I639 Cerebral infarction, unspecified: Secondary | ICD-10-CM | POA: Diagnosis not present

## 2021-11-01 DIAGNOSIS — Z515 Encounter for palliative care: Secondary | ICD-10-CM | POA: Diagnosis not present

## 2021-11-02 ENCOUNTER — Emergency Department (HOSPITAL_COMMUNITY): Payer: Medicare Other

## 2021-11-02 ENCOUNTER — Encounter (HOSPITAL_COMMUNITY): Payer: Self-pay | Admitting: Emergency Medicine

## 2021-11-02 ENCOUNTER — Other Ambulatory Visit: Payer: Self-pay

## 2021-11-02 ENCOUNTER — Inpatient Hospital Stay (HOSPITAL_COMMUNITY)
Admission: EM | Admit: 2021-11-02 | Discharge: 2021-11-05 | DRG: 291 | Disposition: A | Discharge status: Home / Self Care | Payer: Medicare Other | Attending: Internal Medicine | Admitting: Internal Medicine

## 2021-11-02 DIAGNOSIS — Z66 Do not resuscitate: Secondary | ICD-10-CM | POA: Diagnosis not present

## 2021-11-02 DIAGNOSIS — R0602 Shortness of breath: Secondary | ICD-10-CM | POA: Diagnosis not present

## 2021-11-02 DIAGNOSIS — Z90710 Acquired absence of both cervix and uterus: Secondary | ICD-10-CM

## 2021-11-02 DIAGNOSIS — I495 Sick sinus syndrome: Secondary | ICD-10-CM | POA: Diagnosis present

## 2021-11-02 DIAGNOSIS — Z8542 Personal history of malignant neoplasm of other parts of uterus: Secondary | ICD-10-CM

## 2021-11-02 DIAGNOSIS — K219 Gastro-esophageal reflux disease without esophagitis: Secondary | ICD-10-CM | POA: Diagnosis present

## 2021-11-02 DIAGNOSIS — I480 Paroxysmal atrial fibrillation: Secondary | ICD-10-CM | POA: Diagnosis not present

## 2021-11-02 DIAGNOSIS — I5043 Acute on chronic combined systolic (congestive) and diastolic (congestive) heart failure: Secondary | ICD-10-CM

## 2021-11-02 DIAGNOSIS — I4821 Permanent atrial fibrillation: Secondary | ICD-10-CM

## 2021-11-02 DIAGNOSIS — E861 Hypovolemia: Secondary | ICD-10-CM | POA: Diagnosis not present

## 2021-11-02 DIAGNOSIS — R1010 Upper abdominal pain, unspecified: Secondary | ICD-10-CM | POA: Diagnosis not present

## 2021-11-02 DIAGNOSIS — Z8249 Family history of ischemic heart disease and other diseases of the circulatory system: Secondary | ICD-10-CM

## 2021-11-02 DIAGNOSIS — R197 Diarrhea, unspecified: Secondary | ICD-10-CM | POA: Diagnosis present

## 2021-11-02 DIAGNOSIS — Z602 Problems related to living alone: Secondary | ICD-10-CM | POA: Diagnosis present

## 2021-11-02 DIAGNOSIS — Z8673 Personal history of transient ischemic attack (TIA), and cerebral infarction without residual deficits: Secondary | ICD-10-CM

## 2021-11-02 DIAGNOSIS — I10 Essential (primary) hypertension: Secondary | ICD-10-CM | POA: Diagnosis not present

## 2021-11-02 DIAGNOSIS — Z888 Allergy status to other drugs, medicaments and biological substances status: Secondary | ICD-10-CM | POA: Diagnosis not present

## 2021-11-02 DIAGNOSIS — I150 Renovascular hypertension: Secondary | ICD-10-CM | POA: Diagnosis present

## 2021-11-02 DIAGNOSIS — Z981 Arthrodesis status: Secondary | ICD-10-CM

## 2021-11-02 DIAGNOSIS — R5382 Chronic fatigue, unspecified: Secondary | ICD-10-CM | POA: Diagnosis present

## 2021-11-02 DIAGNOSIS — I2510 Atherosclerotic heart disease of native coronary artery without angina pectoris: Secondary | ICD-10-CM | POA: Diagnosis not present

## 2021-11-02 DIAGNOSIS — E871 Hypo-osmolality and hyponatremia: Secondary | ICD-10-CM | POA: Diagnosis present

## 2021-11-02 DIAGNOSIS — I739 Peripheral vascular disease, unspecified: Secondary | ICD-10-CM | POA: Diagnosis present

## 2021-11-02 DIAGNOSIS — I482 Chronic atrial fibrillation, unspecified: Secondary | ICD-10-CM | POA: Diagnosis present

## 2021-11-02 DIAGNOSIS — R630 Anorexia: Secondary | ICD-10-CM | POA: Diagnosis present

## 2021-11-02 DIAGNOSIS — I714 Abdominal aortic aneurysm, without rupture, unspecified: Secondary | ICD-10-CM | POA: Diagnosis present

## 2021-11-02 DIAGNOSIS — I110 Hypertensive heart disease with heart failure: Secondary | ICD-10-CM | POA: Diagnosis not present

## 2021-11-02 DIAGNOSIS — Z79899 Other long term (current) drug therapy: Secondary | ICD-10-CM | POA: Diagnosis not present

## 2021-11-02 DIAGNOSIS — M810 Age-related osteoporosis without current pathological fracture: Secondary | ICD-10-CM | POA: Diagnosis present

## 2021-11-02 DIAGNOSIS — Z950 Presence of cardiac pacemaker: Secondary | ICD-10-CM

## 2021-11-02 DIAGNOSIS — Z8679 Personal history of other diseases of the circulatory system: Secondary | ICD-10-CM

## 2021-11-02 DIAGNOSIS — Z823 Family history of stroke: Secondary | ICD-10-CM | POA: Diagnosis not present

## 2021-11-02 DIAGNOSIS — R1030 Lower abdominal pain, unspecified: Secondary | ICD-10-CM

## 2021-11-02 DIAGNOSIS — Z95820 Peripheral vascular angioplasty status with implants and grafts: Secondary | ICD-10-CM

## 2021-11-02 DIAGNOSIS — E785 Hyperlipidemia, unspecified: Secondary | ICD-10-CM | POA: Diagnosis present

## 2021-11-02 DIAGNOSIS — Z833 Family history of diabetes mellitus: Secondary | ICD-10-CM

## 2021-11-02 DIAGNOSIS — R109 Unspecified abdominal pain: Secondary | ICD-10-CM | POA: Diagnosis present

## 2021-11-02 DIAGNOSIS — F05 Delirium due to known physiological condition: Secondary | ICD-10-CM | POA: Diagnosis not present

## 2021-11-02 DIAGNOSIS — R079 Chest pain, unspecified: Secondary | ICD-10-CM | POA: Diagnosis not present

## 2021-11-02 DIAGNOSIS — Z6821 Body mass index (BMI) 21.0-21.9, adult: Secondary | ICD-10-CM

## 2021-11-02 DIAGNOSIS — Z743 Need for continuous supervision: Secondary | ICD-10-CM | POA: Diagnosis not present

## 2021-11-02 DIAGNOSIS — G629 Polyneuropathy, unspecified: Secondary | ICD-10-CM | POA: Diagnosis not present

## 2021-11-02 DIAGNOSIS — Z7901 Long term (current) use of anticoagulants: Secondary | ICD-10-CM

## 2021-11-02 DIAGNOSIS — R627 Adult failure to thrive: Secondary | ICD-10-CM

## 2021-11-02 DIAGNOSIS — Z96641 Presence of right artificial hip joint: Secondary | ICD-10-CM | POA: Diagnosis present

## 2021-11-02 DIAGNOSIS — R6889 Other general symptoms and signs: Secondary | ICD-10-CM | POA: Diagnosis not present

## 2021-11-02 DIAGNOSIS — K862 Cyst of pancreas: Secondary | ICD-10-CM | POA: Diagnosis present

## 2021-11-02 DIAGNOSIS — I4891 Unspecified atrial fibrillation: Secondary | ICD-10-CM | POA: Diagnosis present

## 2021-11-02 DIAGNOSIS — Z955 Presence of coronary angioplasty implant and graft: Secondary | ICD-10-CM

## 2021-11-02 DIAGNOSIS — Z825 Family history of asthma and other chronic lower respiratory diseases: Secondary | ICD-10-CM

## 2021-11-02 DIAGNOSIS — J9811 Atelectasis: Secondary | ICD-10-CM | POA: Diagnosis not present

## 2021-11-02 LAB — CBC WITH DIFFERENTIAL/PLATELET
Abs Immature Granulocytes: 0.05 10*3/uL (ref 0.00–0.07)
Basophils Absolute: 0.1 10*3/uL (ref 0.0–0.1)
Basophils Relative: 1 %
Eosinophils Absolute: 0.2 10*3/uL (ref 0.0–0.5)
Eosinophils Relative: 2 %
HCT: 35.2 % — ABNORMAL LOW (ref 36.0–46.0)
Hemoglobin: 11.7 g/dL — ABNORMAL LOW (ref 12.0–15.0)
Immature Granulocytes: 1 %
Lymphocytes Relative: 13 %
Lymphs Abs: 1.1 10*3/uL (ref 0.7–4.0)
MCH: 29.8 pg (ref 26.0–34.0)
MCHC: 33.2 g/dL (ref 30.0–36.0)
MCV: 89.6 fL (ref 80.0–100.0)
Monocytes Absolute: 1.2 10*3/uL — ABNORMAL HIGH (ref 0.1–1.0)
Monocytes Relative: 13 %
Neutro Abs: 6.2 10*3/uL (ref 1.7–7.7)
Neutrophils Relative %: 70 %
Platelets: 235 10*3/uL (ref 150–400)
RBC: 3.93 MIL/uL (ref 3.87–5.11)
RDW: 14.4 % (ref 11.5–15.5)
WBC: 8.8 10*3/uL (ref 4.0–10.5)
nRBC: 0 % (ref 0.0–0.2)

## 2021-11-02 LAB — URINALYSIS, ROUTINE W REFLEX MICROSCOPIC
Bilirubin Urine: NEGATIVE
Glucose, UA: NEGATIVE mg/dL
Hgb urine dipstick: NEGATIVE
Ketones, ur: NEGATIVE mg/dL
Leukocytes,Ua: NEGATIVE
Nitrite: NEGATIVE
Protein, ur: NEGATIVE mg/dL
Specific Gravity, Urine: 1.026 (ref 1.005–1.030)
pH: 7 (ref 5.0–8.0)

## 2021-11-02 LAB — D-DIMER, QUANTITATIVE: D-Dimer, Quant: 2.01 ug/mL-FEU — ABNORMAL HIGH (ref 0.00–0.50)

## 2021-11-02 LAB — COMPREHENSIVE METABOLIC PANEL
ALT: 23 U/L (ref 0–44)
AST: 28 U/L (ref 15–41)
Albumin: 3.5 g/dL (ref 3.5–5.0)
Alkaline Phosphatase: 88 U/L (ref 38–126)
Anion gap: 7 (ref 5–15)
BUN: 11 mg/dL (ref 8–23)
CO2: 25 mmol/L (ref 22–32)
Calcium: 8.8 mg/dL — ABNORMAL LOW (ref 8.9–10.3)
Chloride: 95 mmol/L — ABNORMAL LOW (ref 98–111)
Creatinine, Ser: 0.72 mg/dL (ref 0.44–1.00)
GFR, Estimated: >60 mL/min (ref >60)
Glucose, Bld: 117 mg/dL — ABNORMAL HIGH (ref 70–99)
Potassium: 4.4 mmol/L (ref 3.5–5.1)
Sodium: 127 mmol/L — ABNORMAL LOW (ref 135–145)
Total Bilirubin: 1 mg/dL (ref 0.3–1.2)
Total Protein: 6.1 g/dL — ABNORMAL LOW (ref 6.5–8.1)

## 2021-11-02 LAB — TROPONIN I (HIGH SENSITIVITY)
Troponin I (High Sensitivity): 5 ng/L (ref <18)
Troponin I (High Sensitivity): 4 ng/L (ref <18)

## 2021-11-02 LAB — SODIUM, URINE, RANDOM: Sodium, Ur: 103 mmol/L

## 2021-11-02 MED ORDER — HYDROMORPHONE HCL 1 MG/ML IJ SOLN
0.5000 mg | Freq: Once | INTRAMUSCULAR | Status: AC
  Administered 2021-11-02: 0.5 mg via INTRAVENOUS
  Filled 2021-11-02: qty 0.5

## 2021-11-02 MED ORDER — PANTOPRAZOLE SODIUM 40 MG IV SOLR
40.0000 mg | Freq: Once | INTRAVENOUS | Status: AC
  Administered 2021-11-02: 40 mg via INTRAVENOUS
  Filled 2021-11-02: qty 10

## 2021-11-02 MED ORDER — IOHEXOL 350 MG/ML SOLN
75.0000 mL | Freq: Once | INTRAVENOUS | Status: AC | PRN
  Administered 2021-11-02: 75 mL via INTRAVENOUS

## 2021-11-02 MED ORDER — ONDANSETRON HCL 4 MG/2ML IJ SOLN
4.0000 mg | Freq: Once | INTRAMUSCULAR | Status: AC
  Administered 2021-11-02: 4 mg via INTRAVENOUS
  Filled 2021-11-02: qty 2

## 2021-11-02 MED ORDER — SODIUM CHLORIDE 0.9 % IV BOLUS
500.0000 mL | Freq: Once | INTRAVENOUS | Status: AC
  Administered 2021-11-02: 500 mL via INTRAVENOUS

## 2021-11-02 NOTE — H&P (Signed)
History and Physical    Patient: Karina [name] DTO:[id number] DOB: 05-Jan-1923 DOA: 11/02/2021 DOS: the patient was seen and examined on 11/02/2021 PCP: [name], MD  Patient coming from: Home  Chief Complaint:  Chief Complaint  Patient presents with   Abdominal Pain   HPI: Karina [name] is a 88 y.o. female with medical history significant of coronary artery disease with combined systolic, diastolic heart failure, paroxysmal atrial fibrillation on Eliquis, hypertension, hyperlipidemia.  Patient seen for a week of increasing abdominal pain in her lower abdomen with diminished appetite.  Her pain is generally fairly mild although becomes quite severe at times.  No changes to stooling habits.  Patient has not been drinking hardly any water at this point and has not been eating much.  She has been taking her medicines as prescribed.  In the emergency department she was fairly comfortable, then had a sudden onset of severe pain.  She was given half milligram of Dilaudid which sedated her.  She is now more alert, but comfortable.  Review of Systems: As mentioned in the history of present illness. All other systems reviewed and are negative. Past Medical History:  Diagnosis Date   Actinic keratosis    Bilateral leg weakness 09/27/12   CAD (coronary artery disease)    EF 55% by 2 D Echo 2008 and 2012   Chronic atrial fibrillation (HCC)    Chronic edema 10/15/11   Chronic fatigue    DDD (degenerative disc disease)    Encounter for long-term (current) use of other medications 09/02/12   Endometrial carcinoma ([place])    Fibromuscular dysplasia ([place])    S/P PCI right renal aretery 1999   GERD (gastroesophageal reflux disease)    Hip fracture, right ([place]) 08/06/06   S/P surgery   Hx of cardiovascular stress test 09/2010   Nuclear stress testing showing no evidence of ischemia, EF=76%, No EKG changes & no perfusion defects.   Hx of echocardiogram 05/2012    EF >55%, LA 3.9 cm, mild LVH    Hyperlipidemia    Hypertension    Difficult to control   Hyponatremia    Hypomatremia/SIADH, possibly secondary to Tegretol   LVH (left ventricular hypertrophy)    Mild   Osteoarthritis    Osteoporosis    With Hx of thoracic compression fractures   Pacemaker    Paroxysmal atrial fibrillation (HCC)    Peripheral neuropathy    Peripheral vascular disease ([place])    Pre-diabetes 01/21/11   Chronic   Renovascular hypertension    Tachy-brady syndrome (HCC)    Thoracic compression fracture (HCC)    TIA (transient ischemic attack) 08/2011   OSH: flushing, left LE numbness, CT negative   [place] 1994   S/P successful surgery   Trigeminal neuralgia of right side of face    Past Surgical History:  Procedure Laterality Date   ABDOMINAL AORTAGRAM  09/05/01   Right & left renals 25%   APPENDECTOMY  1946   CARDIAC CATHETERIZATION  11/05/01   EF 72%. RCA 25%. LCX 50%. Ramus 75/100%. LAD 95%. D2 75%.   [place]   CORONARY ANGIOPLASTY     CORONARY ANGIOPLASTY WITH STENT PLACEMENT  11/05/01   PTCA/Stent to 95% mid LAD, Ramus lesion could not be crossed and was not treated.    DIPYRIDAMOLE STRESS TEST  09/2010   Nuclear stress testing showing no evidence of ischemia, EF=76%, No EKG changes & no  perfusion defects.   LUMBAR DISC SURGERY     ORIF FEMUR FRACTURE Right 02/16/2017   Procedure: OPEN REDUCTION INTERNAL FIXATION (ORIF) DISTAL FEMUR FRACTURE;  Surgeon: Shona Needles, MD;  Location: [facility];  Service: Orthopedics;  Laterality: Right;   RENAL ANGIOGRAM  09/14/97   25% diffuse left renal artery. 50% ostia; right renal artery.   RENAL ARTERY ANGIOPLASTY Right 1994   Renal artery stenosis secondary to fibromusclar dysplasia   RENAL ARTERY ANGIOPLASTY Right 09/13/97   PTA/Stent to ostial right renal artery, No distal fibromuscular hyperplasia   SPINAL FUSION  2002   Percutaneous spinal fusion   TOTAL ABDOMINAL HYSTERECTOMY W/  BILATERAL SALPINGOOPHORECTOMY  1990   VAGINAL HYSTERECTOMY  1990   Social History:  reports that she has never smoked. She has never used smokeless tobacco. She reports current alcohol use. She reports that she does not use drugs.  Allergies  Allergen Reactions   Ace Inhibitors Hives   Amiodarone Other (See Comments)    Worsening peripheral neuropathy   Tikosyn [Dofetilide] Other (See Comments)    QT prolongation   Meperidine And Related Other (See Comments)    Unknown reaction    Family History  Problem Relation Age of Onset   Stroke Father    COPD Brother    Diabetes Brother        DM   Hypertension Brother    Stroke Other     Prior to Admission medications   Medication Sig Start Date End Date Taking? Authorizing Provider  apixaban (ELIQUIS) 2.5 MG TABS tablet Take 1 tablet (2.5 mg total) 2 (two) times daily by mouth. 02/23/17   Eloise Levels, MD  Ascorbic Acid (VITAMIN C PO) Take 1 tablet by mouth every morning.    [provider]  cefdinir (OMNICEF) 300 MG capsule Take 1 capsule (300 mg total) by mouth 2 (two) times daily. 05/08/21   Patrecia Pour, MD  Cholecalciferol (VITAMIN D3 PO) Take 1 capsule by mouth every morning.    [provider]  citalopram (CELEXA) 10 MG tablet Take 5 mg by mouth every morning. 04/17/21   [provider]  cloNIDine (CATAPRES) 0.1 MG tablet Take 0.1 mg by mouth 3 (three) times daily. 05/07/09   [provider]  diltiazem (CARDIZEM CD) 120 MG 24 hr capsule Take 120 mg by mouth every morning. 11/15/14   [provider]  furosemide (LASIX) 40 MG tablet Take 40 mg by mouth daily as needed for fluid or edema.    [provider]  metoprolol succinate (TOPROL-XL) 25 MG 24 hr tablet Take 25 mg by mouth 2 (two) times daily. 04/17/21   [provider]  mirtazapine (REMERON SOL-TAB) 15 MG disintegrating tablet Take 15 mg by mouth every morning.    [provider]  pantoprazole (PROTONIX)  40 MG tablet Take 40 mg by mouth every morning.    [provider]  polyethylene glycol powder (GLYCOLAX/MIRALAX) 17 GM/SCOOP powder Take 17 g by mouth daily as needed (constipation). 02/23/17   [provider]  QUERCETIN PO Take 1 tablet by mouth daily.    [provider]  rosuvastatin (CRESTOR) 5 MG tablet Take 1 tablet (5 mg total) by mouth daily. Patient taking differently: Take 5 mg by mouth every morning. 12/21/18   Roxan Hockey, MD  valsartan (DIOVAN) 80 MG tablet Take 80 mg by mouth every morning.    [provider]  Zinc Gluconate 100 MG TABS Take 100 mg by mouth  every morning.    [provider]    Physical Exam: Vitals:   11/02/21 1930 11/02/21 1945 11/02/21 2000 11/02/21 2015  BP: (!) 182/108 (!) 159/94 (!) 173/102   Pulse: 81 88 85 86  Resp: (!) '23 13 16 '$ (!) 22  Temp:      TempSrc:      SpO2: 94% 100% 94% 96%  Weight:      Height:       General: Elderly female. Awake and alert and oriented x3. No acute cardiopulmonary distress.  HEENT: Normocephalic atraumatic.  Right and left ears normal in appearance.  Pupils equal, round, reactive to light. Extraocular muscles are intact. Sclerae anicteric and noninjected.  Moist mucosal membranes. No mucosal lesions.  Neck: Neck supple without lymphadenopathy. No carotid bruits. No masses palpated.  Cardiovascular: Regular rate with normal S1-S2 sounds. No murmurs, rubs, gallops auscultated. No JVD.  Respiratory: Good respiratory effort with no wheezes, rales, rhonchi.  Mild crackles in bases.  No accessory muscle use. Abdomen: Soft, nontender, nondistended. Active bowel sounds. No masses or hepatosplenomegaly  Skin: No rashes, lesions, or ulcerations.  Dry, warm to touch. 2+ dorsalis pedis and radial pulses. Musculoskeletal: No calf or leg pain. All major joints not erythematous nontender.  No upper or lower joint deformation.  Good ROM.  No contractures  Psychiatric: Intact judgment and  insight. Pleasant and cooperative. Neurologic: No focal neurological deficits. Strength is 5/5 and symmetric in upper and lower extremities.  Cranial nerves II through XII are grossly intact.  Data Reviewed: Results for orders placed or performed during the hospital encounter of 11/02/21 (from the past 24 hour(s))  CBC with Differential     Status: Abnormal   Collection Time: 11/02/21  6:41 PM  Result Value Ref Range   WBC 8.8 4.0 - 10.5 K/uL   RBC 3.93 3.87 - 5.11 MIL/uL   Hemoglobin 11.7 (L) 12.0 - 15.0 g/dL   HCT 35.2 (L) 36.0 - 46.0 %   MCV 89.6 80.0 - 100.0 fL   MCH 29.8 26.0 - 34.0 pg   MCHC 33.2 30.0 - 36.0 g/dL   RDW 14.4 11.5 - 15.5 %   Platelets 235 150 - 400 K/uL   nRBC 0.0 0.0 - 0.2 %   Neutrophils Relative % 70 %   Neutro Abs 6.2 1.7 - 7.7 K/uL   Lymphocytes Relative 13 %   Lymphs Abs 1.1 0.7 - 4.0 K/uL   Monocytes Relative 13 %   Monocytes Absolute 1.2 (H) 0.1 - 1.0 K/uL   Eosinophils Relative 2 %   Eosinophils Absolute 0.2 0.0 - 0.5 K/uL   Basophils Relative 1 %   Basophils Absolute 0.1 0.0 - 0.1 K/uL   Immature Granulocytes 1 %   Abs Immature Granulocytes 0.05 0.00 - 0.07 K/uL  Comprehensive metabolic panel     Status: Abnormal   Collection Time: 11/02/21  6:41 PM  Result Value Ref Range   Sodium 127 (L) 135 - 145 mmol/L   Potassium 4.4 3.5 - 5.1 mmol/L   Chloride 95 (L) 98 - 111 mmol/L   CO2 25 22 - 32 mmol/L   Glucose, Bld 117 (H) 70 - 99 mg/dL   BUN 11 8 - 23 mg/dL   Creatinine, Ser 0.72 0.44 - 1.00 mg/dL   Calcium 8.8 (L) 8.9 - 10.3 mg/dL   Total Protein 6.1 (L) 6.5 - 8.1 g/dL   Albumin 3.5 3.5 - 5.0 g/dL   AST 28 15 - 41 U/L  ALT 23 0 - 44 U/L   Alkaline Phosphatase 88 38 - 126 U/L   Total Bilirubin 1.0 0.3 - 1.2 mg/dL   GFR, Estimated >60 >60 mL/min   Anion gap 7 5 - 15  Troponin I (High Sensitivity)     Status: None   Collection Time: 11/02/21  6:41 PM  Result Value Ref Range   Troponin I (High Sensitivity) 5 <18 ng/L  D-dimer, quantitative      Status: Abnormal   Collection Time: 11/02/21  6:41 PM  Result Value Ref Range   D-Dimer, Quant 2.01 (H) 0.00 - 0.50 ug/mL-FEU  Troponin I (High Sensitivity)     Status: None   Collection Time: 11/02/21  9:07 PM  Result Value Ref Range   Troponin I (High Sensitivity) 4 <18 ng/L   CT Angio Chest PE W and/or Wo Contrast  Result Date: 11/02/2021 CLINICAL DATA:  Shortness of breath on exertion with abdominal pain and diarrhea for several days, initial encounter EXAM: CT ANGIOGRAPHY CHEST CT ABDOMEN AND PELVIS WITH CONTRAST TECHNIQUE: Multidetector CT imaging of the chest was performed using the standard protocol during bolus administration of intravenous contrast. Multiplanar CT image reconstructions and MIPs were obtained to evaluate the vascular anatomy. Multidetector CT imaging of the abdomen and pelvis was performed using the standard protocol during bolus administration of intravenous contrast. RADIATION DOSE REDUCTION: This exam was performed according to the departmental dose-optimization program which includes automated exposure control, adjustment of the mA and/or kV according to patient size and/or use of iterative reconstruction technique. CONTRAST:  79m OMNIPAQUE IOHEXOL 350 MG/ML SOLN COMPARISON:  Plain film from earlier in the same day, CT from 05/07/2021. FINDINGS: CTA CHEST FINDINGS Cardiovascular: Thoracic aorta demonstrates atherosclerotic calcifications without aneurysmal dilatation. No significant aortic opacification is identified to evaluate for dissection. The pulmonary artery shows a normal branching pattern bilaterally. No filling defect to suggest pulmonary embolism is noted. Coronary calcifications are seen. The heart is enlarged in size. Pacing device is noted in place. Mediastinum/Nodes: Thoracic inlet is within normal limits. No sizable hilar or mediastinal adenopathy is noted. The esophagus is within normal limits. Lungs/Pleura: Small effusions are noted bilaterally right  greater than left. Basilar atelectasis is seen right greater than left as well. No focal confluent infiltrate is seen. No sizable parenchymal nodules are noted. Mild interstitial edema is noted as well. Musculoskeletal: Degenerative changes of the thoracic spine are noted. No acute rib abnormality is seen. Chronic appearing compression deformities of T11 and T12 are noted. These are stable from a prior exam from 05/07/2021 Review of the MIP images confirms the above findings. CT ABDOMEN and PELVIS FINDINGS Hepatobiliary: Gallbladder has been surgically removed. The liver is within normal limits. Pancreas: Stable midbody pancreatic cyst is noted measuring 1 cm. Remainder of the pancreas is within normal limits. Spleen: Normal in size without focal abnormality. Adrenals/Urinary Tract: Adrenal glands are within normal limits. Kidneys demonstrate a normal enhancement pattern bilaterally. Normal excretion is noted on delayed images. No renal calculi or obstructive changes are seen. The bladder is well distended. Stomach/Bowel: Scattered diverticular change of the colon is noted without evidence of diverticulitis. Changes of prior appendectomy are noted. Small bowel and stomach are within normal limits. Vascular/Lymphatic: There are changes consistent with abdominal aortic aneurysm with prior stent graft therapy. No findings to suggest endoleak are identified. The overall appearance of the aneurysm is stable. The sac measures approximately 6.3 cm. Stent graft is widely patent with runoff into the iliac vessels bilaterally. Stable  left iliac aneurysm is noted no sizable lymphadenopathy is noted. Reproductive: Status post hysterectomy. No adnexal masses. Other: Postsurgical changes are noted in the inguinal regions bilaterally stable from the prior exam. No free fluid is noted. No focal hernia is seen. Musculoskeletal: Degenerative changes of the lumbar spine are noted as well as multilevel fusion with posterior fixation.  Right hip replacement is noted as well. Review of the MIP images confirms the above findings. IMPRESSION: CTA of the chest: No evidence of pulmonary emboli. Bilateral pleural effusions with basilar atelectasis. Chronic compression deformities of T11 and T12 stable from the prior exam. CT of the abdomen and pelvis: Diverticulosis without diverticulitis. Abdominal aortic aneurysm with evidence of stent graft therapy without significant change in the size of the aneurysm sac. No endoleak is identified. Stable pancreatic cyst in the mid body. No further follow-up is recommended given the patient's age. Electronically Signed   By: Inez Catalina M.D.   On: 11/02/2021 20:33   CT ABDOMEN PELVIS W CONTRAST  Result Date: 11/02/2021 CLINICAL DATA:  Shortness of breath on exertion with abdominal pain and diarrhea for several days, initial encounter EXAM: CT ANGIOGRAPHY CHEST CT ABDOMEN AND PELVIS WITH CONTRAST TECHNIQUE: Multidetector CT imaging of the chest was performed using the standard protocol during bolus administration of intravenous contrast. Multiplanar CT image reconstructions and MIPs were obtained to evaluate the vascular anatomy. Multidetector CT imaging of the abdomen and pelvis was performed using the standard protocol during bolus administration of intravenous contrast. RADIATION DOSE REDUCTION: This exam was performed according to the departmental dose-optimization program which includes automated exposure control, adjustment of the mA and/or kV according to patient size and/or use of iterative reconstruction technique. CONTRAST:  13m OMNIPAQUE IOHEXOL 350 MG/ML SOLN COMPARISON:  Plain film from earlier in the same day, CT from 05/07/2021. FINDINGS: CTA CHEST FINDINGS Cardiovascular: Thoracic aorta demonstrates atherosclerotic calcifications without aneurysmal dilatation. No significant aortic opacification is identified to evaluate for dissection. The pulmonary artery shows a normal branching pattern  bilaterally. No filling defect to suggest pulmonary embolism is noted. Coronary calcifications are seen. The heart is enlarged in size. Pacing device is noted in place. Mediastinum/Nodes: Thoracic inlet is within normal limits. No sizable hilar or mediastinal adenopathy is noted. The esophagus is within normal limits. Lungs/Pleura: Small effusions are noted bilaterally right greater than left. Basilar atelectasis is seen right greater than left as well. No focal confluent infiltrate is seen. No sizable parenchymal nodules are noted. Mild interstitial edema is noted as well. Musculoskeletal: Degenerative changes of the thoracic spine are noted. No acute rib abnormality is seen. Chronic appearing compression deformities of T11 and T12 are noted. These are stable from a prior exam from 05/07/2021 Review of the MIP images confirms the above findings. CT ABDOMEN and PELVIS FINDINGS Hepatobiliary: Gallbladder has been surgically removed. The liver is within normal limits. Pancreas: Stable midbody pancreatic cyst is noted measuring 1 cm. Remainder of the pancreas is within normal limits. Spleen: Normal in size without focal abnormality. Adrenals/Urinary Tract: Adrenal glands are within normal limits. Kidneys demonstrate a normal enhancement pattern bilaterally. Normal excretion is noted on delayed images. No renal calculi or obstructive changes are seen. The bladder is well distended. Stomach/Bowel: Scattered diverticular change of the colon is noted without evidence of diverticulitis. Changes of prior appendectomy are noted. Small bowel and stomach are within normal limits. Vascular/Lymphatic: There are changes consistent with abdominal aortic aneurysm with prior stent graft therapy. No findings to suggest endoleak are identified. The overall  appearance of the aneurysm is stable. The sac measures approximately 6.3 cm. Stent graft is widely patent with runoff into the iliac vessels bilaterally. Stable left iliac aneurysm  is noted no sizable lymphadenopathy is noted. Reproductive: Status post hysterectomy. No adnexal masses. Other: Postsurgical changes are noted in the inguinal regions bilaterally stable from the prior exam. No free fluid is noted. No focal hernia is seen. Musculoskeletal: Degenerative changes of the lumbar spine are noted as well as multilevel fusion with posterior fixation. Right hip replacement is noted as well. Review of the MIP images confirms the above findings. IMPRESSION: CTA of the chest: No evidence of pulmonary emboli. Bilateral pleural effusions with basilar atelectasis. Chronic compression deformities of T11 and T12 stable from the prior exam. CT of the abdomen and pelvis: Diverticulosis without diverticulitis. Abdominal aortic aneurysm with evidence of stent graft therapy without significant change in the size of the aneurysm sac. No endoleak is identified. Stable pancreatic cyst in the mid body. No further follow-up is recommended given the patient's age. Electronically Signed   By: Inez Catalina M.D.   On: 11/02/2021 20:33   DG Chest Port 1 View  Result Date: 11/02/2021 CLINICAL DATA:  Shortness of breath EXAM: PORTABLE CHEST 1 VIEW COMPARISON:  Chest x-ray 09/18/2021 FINDINGS: Heart is enlarged, unchanged. There are atherosclerotic calcifications of the aorta. Left-sided pacemaker is unchanged in position. There is some minimal patchy opacities in both lung bases, left greater than right. No pleural effusion or pneumothorax. No acute fractures. Lumbar fusion hardware is partially visualized. IMPRESSION: 1. Bibasilar atelectasis/airspace disease, left greater than right. Pneumonia is a possibility in the left lung base. 2. Stable cardiomegaly. Electronically Signed   By: Ronney Asters M.D.   On: 11/02/2021 19:12     Assessment and Plan: No notes have been filed under this hospital service. Service: Hospitalist  Principal Problem:   Abdominal pain Active Problems:   Atrial fibrillation  (HCC)   Essential hypertension   Hyponatremia   History of TIA (transient ischemic attack)   Decreased appetite  Abdominal pain Uncertain of etiology.  Will get UA.   CT of the abdomen was normal: has history of AAA and is status post graft without any changes. We will keep patient comfortable with extremely low-dose pain medicine Hyponatremia Secondary to hypovolemia. We will replace with normal saline We will get urine sodium and osmolality, although this may be skewed due to Lasix use We will hold mirtazapine for now while we are replacing sodium. Diminished appetite We will give Ensure drinks in between meals Atrial fibrillation Appears to be in sinus rhythm currently. Continue Eliquis and beta-blockers and diltiazem Hypertension Continue antihypertensives TIA   Advance Care Planning:   Code Status: Prior DNR confirmed by patient  Consults: None  Family Communication: None available  Severity of Illness: The appropriate patient status for this patient is INPATIENT. Inpatient status is judged to be reasonable and necessary in order to provide the required intensity of service to ensure the patient's safety. The patient's presenting symptoms, physical exam findings, and initial radiographic and laboratory data in the context of their chronic comorbidities is felt to place them at high risk for further clinical deterioration. Furthermore, it is not anticipated that the patient will be medically stable for discharge from the hospital within 2 midnights of admission.   * I certify that at the point of admission it is my clinical judgment that the patient will require inpatient hospital care spanning beyond 2 midnights from the point  of admission due to high intensity of service, high risk for further deterioration and high frequency of surveillance required.*  Author: Truett Mainland, DO 11/02/2021 10:18 PM  For on call review [email or web address].

## 2021-11-02 NOTE — ED Provider Notes (Signed)
[facility] EMERGENCY DEPARTMENT Provider Note   CSN: [id number] Arrival date & time: 11/02/21  1743     History {Add pertinent medical, surgical, social history, OB history to HPI:1} Chief Complaint  Patient presents with   Abdominal Pain    Karina [name] is a 88 y.o. female.  Patient states she has been having abdominal discomfort for 3 days with diarrhea and some dyspnea on exertion.  She has a history of coronary artery disease   Abdominal Pain      Home Medications Prior to Admission medications   Medication Sig Start Date End Date Taking? Authorizing Provider  apixaban (ELIQUIS) 2.5 MG TABS tablet Take 1 tablet (2.5 mg total) 2 (two) times daily by mouth. 02/23/17   Eloise Levels, MD  Ascorbic Acid (VITAMIN C PO) Take 1 tablet by mouth every morning.    [provider]  cefdinir (OMNICEF) 300 MG capsule Take 1 capsule (300 mg total) by mouth 2 (two) times daily. 05/08/21   Patrecia Pour, MD  Cholecalciferol (VITAMIN D3 PO) Take 1 capsule by mouth every morning.    [provider]  citalopram (CELEXA) 10 MG tablet Take 5 mg by mouth every morning. 04/17/21   [provider]  cloNIDine (CATAPRES) 0.1 MG tablet Take 0.1 mg by mouth 3 (three) times daily. 05/07/09   [provider]  diltiazem (CARDIZEM CD) 120 MG 24 hr capsule Take 120 mg by mouth every morning. 11/15/14   [provider]  furosemide (LASIX) 40 MG tablet Take 40 mg by mouth daily as needed for fluid or edema.    [provider]  metoprolol succinate (TOPROL-XL) 25 MG 24 hr tablet Take 25 mg by mouth 2 (two) times daily. 04/17/21   [provider]  mirtazapine (REMERON SOL-TAB) 15 MG disintegrating tablet Take 15 mg by mouth every morning.    [provider]  pantoprazole (PROTONIX) 40 MG tablet Take 40 mg by mouth every morning.    [provider]  polyethylene glycol powder (GLYCOLAX/MIRALAX) 17 GM/SCOOP powder Take 17 g by mouth  daily as needed (constipation). 02/23/17   [provider]  QUERCETIN PO Take 1 tablet by mouth daily.    [provider]  rosuvastatin (CRESTOR) 5 MG tablet Take 1 tablet (5 mg total) by mouth daily. Patient taking differently: Take 5 mg by mouth every morning. 12/21/18   Roxan Hockey, MD  valsartan (DIOVAN) 80 MG tablet Take 80 mg by mouth every morning.    [provider]  Zinc Gluconate 100 MG TABS Take 100 mg by mouth every morning.    [provider]      Allergies    Ace inhibitors, Amiodarone, Tikosyn [dofetilide], and Meperidine and related    Review of Systems   Review of Systems  Gastrointestinal:  Positive for abdominal pain.    Physical Exam Updated Vital Signs BP (!) 173/102   Pulse 86   Temp 98.4 F (36.9 C) (Oral)   Resp (!) 22   Ht 5' 1.5" (1.562 m)   Wt 52.2 kg   SpO2 96%   BMI 21.38 kg/m  Physical Exam  ED Results / Procedures / Treatments   Labs (all labs ordered are listed, but only abnormal results are displayed) Labs Reviewed  CBC WITH DIFFERENTIAL/PLATELET - Abnormal; Notable for the following components:      Result Value   Hemoglobin 11.7 (*)    HCT 35.2 (*)    Monocytes Absolute 1.2 (*)  All other components within normal limits  COMPREHENSIVE METABOLIC PANEL - Abnormal; Notable for the following components:   Sodium 127 (*)    Chloride 95 (*)    Glucose, Bld 117 (*)    Calcium 8.8 (*)    Total Protein 6.1 (*)    All other components within normal limits  D-DIMER, QUANTITATIVE - Abnormal; Notable for the following components:   D-Dimer, Quant 2.01 (*)    All other components within normal limits  URINALYSIS, ROUTINE W REFLEX MICROSCOPIC  SODIUM, URINE, RANDOM  OSMOLALITY, URINE  TROPONIN I (HIGH SENSITIVITY)  TROPONIN I (HIGH SENSITIVITY)    EKG None  Radiology CT Angio Chest PE W and/or Wo Contrast  Result Date: 11/02/2021 CLINICAL DATA:  Shortness of breath on exertion with abdominal  pain and diarrhea for several days, initial encounter EXAM: CT ANGIOGRAPHY CHEST CT ABDOMEN AND PELVIS WITH CONTRAST TECHNIQUE: Multidetector CT imaging of the chest was performed using the standard protocol during bolus administration of intravenous contrast. Multiplanar CT image reconstructions and MIPs were obtained to evaluate the vascular anatomy. Multidetector CT imaging of the abdomen and pelvis was performed using the standard protocol during bolus administration of intravenous contrast. RADIATION DOSE REDUCTION: This exam was performed according to the departmental dose-optimization program which includes automated exposure control, adjustment of the mA and/or kV according to patient size and/or use of iterative reconstruction technique. CONTRAST:  40m OMNIPAQUE IOHEXOL 350 MG/ML SOLN COMPARISON:  Plain film from earlier in the same day, CT from 05/07/2021. FINDINGS: CTA CHEST FINDINGS Cardiovascular: Thoracic aorta demonstrates atherosclerotic calcifications without aneurysmal dilatation. No significant aortic opacification is identified to evaluate for dissection. The pulmonary artery shows a normal branching pattern bilaterally. No filling defect to suggest pulmonary embolism is noted. Coronary calcifications are seen. The heart is enlarged in size. Pacing device is noted in place. Mediastinum/Nodes: Thoracic inlet is within normal limits. No sizable hilar or mediastinal adenopathy is noted. The esophagus is within normal limits. Lungs/Pleura: Small effusions are noted bilaterally right greater than left. Basilar atelectasis is seen right greater than left as well. No focal confluent infiltrate is seen. No sizable parenchymal nodules are noted. Mild interstitial edema is noted as well. Musculoskeletal: Degenerative changes of the thoracic spine are noted. No acute rib abnormality is seen. Chronic appearing compression deformities of T11 and T12 are noted. These are stable from a prior exam from  05/07/2021 Review of the MIP images confirms the above findings. CT ABDOMEN and PELVIS FINDINGS Hepatobiliary: Gallbladder has been surgically removed. The liver is within normal limits. Pancreas: Stable midbody pancreatic cyst is noted measuring 1 cm. Remainder of the pancreas is within normal limits. Spleen: Normal in size without focal abnormality. Adrenals/Urinary Tract: Adrenal glands are within normal limits. Kidneys demonstrate a normal enhancement pattern bilaterally. Normal excretion is noted on delayed images. No renal calculi or obstructive changes are seen. The bladder is well distended. Stomach/Bowel: Scattered diverticular change of the colon is noted without evidence of diverticulitis. Changes of prior appendectomy are noted. Small bowel and stomach are within normal limits. Vascular/Lymphatic: There are changes consistent with abdominal aortic aneurysm with prior stent graft therapy. No findings to suggest endoleak are identified. The overall appearance of the aneurysm is stable. The sac measures approximately 6.3 cm. Stent graft is widely patent with runoff into the iliac vessels bilaterally. Stable left iliac aneurysm is noted no sizable lymphadenopathy is noted. Reproductive: Status post hysterectomy. No adnexal masses. Other: Postsurgical changes are noted in the inguinal regions bilaterally  stable from the prior exam. No free fluid is noted. No focal hernia is seen. Musculoskeletal: Degenerative changes of the lumbar spine are noted as well as multilevel fusion with posterior fixation. Right hip replacement is noted as well. Review of the MIP images confirms the above findings. IMPRESSION: CTA of the chest: No evidence of pulmonary emboli. Bilateral pleural effusions with basilar atelectasis. Chronic compression deformities of T11 and T12 stable from the prior exam. CT of the abdomen and pelvis: Diverticulosis without diverticulitis. Abdominal aortic aneurysm with evidence of stent graft therapy  without significant change in the size of the aneurysm sac. No endoleak is identified. Stable pancreatic cyst in the mid body. No further follow-up is recommended given the patient's age. Electronically Signed   By: Inez Catalina M.D.   On: 11/02/2021 20:33   CT ABDOMEN PELVIS W CONTRAST  Result Date: 11/02/2021 CLINICAL DATA:  Shortness of breath on exertion with abdominal pain and diarrhea for several days, initial encounter EXAM: CT ANGIOGRAPHY CHEST CT ABDOMEN AND PELVIS WITH CONTRAST TECHNIQUE: Multidetector CT imaging of the chest was performed using the standard protocol during bolus administration of intravenous contrast. Multiplanar CT image reconstructions and MIPs were obtained to evaluate the vascular anatomy. Multidetector CT imaging of the abdomen and pelvis was performed using the standard protocol during bolus administration of intravenous contrast. RADIATION DOSE REDUCTION: This exam was performed according to the departmental dose-optimization program which includes automated exposure control, adjustment of the mA and/or kV according to patient size and/or use of iterative reconstruction technique. CONTRAST:  3m OMNIPAQUE IOHEXOL 350 MG/ML SOLN COMPARISON:  Plain film from earlier in the same day, CT from 05/07/2021. FINDINGS: CTA CHEST FINDINGS Cardiovascular: Thoracic aorta demonstrates atherosclerotic calcifications without aneurysmal dilatation. No significant aortic opacification is identified to evaluate for dissection. The pulmonary artery shows a normal branching pattern bilaterally. No filling defect to suggest pulmonary embolism is noted. Coronary calcifications are seen. The heart is enlarged in size. Pacing device is noted in place. Mediastinum/Nodes: Thoracic inlet is within normal limits. No sizable hilar or mediastinal adenopathy is noted. The esophagus is within normal limits. Lungs/Pleura: Small effusions are noted bilaterally right greater than left. Basilar atelectasis is  seen right greater than left as well. No focal confluent infiltrate is seen. No sizable parenchymal nodules are noted. Mild interstitial edema is noted as well. Musculoskeletal: Degenerative changes of the thoracic spine are noted. No acute rib abnormality is seen. Chronic appearing compression deformities of T11 and T12 are noted. These are stable from a prior exam from 05/07/2021 Review of the MIP images confirms the above findings. CT ABDOMEN and PELVIS FINDINGS Hepatobiliary: Gallbladder has been surgically removed. The liver is within normal limits. Pancreas: Stable midbody pancreatic cyst is noted measuring 1 cm. Remainder of the pancreas is within normal limits. Spleen: Normal in size without focal abnormality. Adrenals/Urinary Tract: Adrenal glands are within normal limits. Kidneys demonstrate a normal enhancement pattern bilaterally. Normal excretion is noted on delayed images. No renal calculi or obstructive changes are seen. The bladder is well distended. Stomach/Bowel: Scattered diverticular change of the colon is noted without evidence of diverticulitis. Changes of prior appendectomy are noted. Small bowel and stomach are within normal limits. Vascular/Lymphatic: There are changes consistent with abdominal aortic aneurysm with prior stent graft therapy. No findings to suggest endoleak are identified. The overall appearance of the aneurysm is stable. The sac measures approximately 6.3 cm. Stent graft is widely patent with runoff into the iliac vessels bilaterally. Stable left iliac  aneurysm is noted no sizable lymphadenopathy is noted. Reproductive: Status post hysterectomy. No adnexal masses. Other: Postsurgical changes are noted in the inguinal regions bilaterally stable from the prior exam. No free fluid is noted. No focal hernia is seen. Musculoskeletal: Degenerative changes of the lumbar spine are noted as well as multilevel fusion with posterior fixation. Right hip replacement is noted as well.  Review of the MIP images confirms the above findings. IMPRESSION: CTA of the chest: No evidence of pulmonary emboli. Bilateral pleural effusions with basilar atelectasis. Chronic compression deformities of T11 and T12 stable from the prior exam. CT of the abdomen and pelvis: Diverticulosis without diverticulitis. Abdominal aortic aneurysm with evidence of stent graft therapy without significant change in the size of the aneurysm sac. No endoleak is identified. Stable pancreatic cyst in the mid body. No further follow-up is recommended given the patient's age. Electronically Signed   By: Inez Catalina M.D.   On: 11/02/2021 20:33   DG Chest Port 1 View  Result Date: 11/02/2021 CLINICAL DATA:  Shortness of breath EXAM: PORTABLE CHEST 1 VIEW COMPARISON:  Chest x-ray 09/18/2021 FINDINGS: Heart is enlarged, unchanged. There are atherosclerotic calcifications of the aorta. Left-sided pacemaker is unchanged in position. There is some minimal patchy opacities in both lung bases, left greater than right. No pleural effusion or pneumothorax. No acute fractures. Lumbar fusion hardware is partially visualized. IMPRESSION: 1. Bibasilar atelectasis/airspace disease, left greater than right. Pneumonia is a possibility in the left lung base. 2. Stable cardiomegaly. Electronically Signed   By: Ronney Asters M.D.   On: 11/02/2021 19:12    Procedures Procedures  {Document cardiac monitor, telemetry assessment procedure when appropriate:1}  Medications Ordered in ED Medications  sodium chloride 0.9 % bolus 500 mL (0 mLs Intravenous Stopped 11/02/21 2017)  HYDROmorphone (DILAUDID) injection 0.5 mg (0.5 mg Intravenous Given 11/02/21 1926)  ondansetron (ZOFRAN) injection 4 mg (4 mg Intravenous Given 11/02/21 1926)  pantoprazole (PROTONIX) injection 40 mg (40 mg Intravenous Given 11/02/21 1930)  iohexol (OMNIPAQUE) 350 MG/ML injection 75 mL (75 mLs Intravenous Contrast Given 11/02/21 2000)    ED Course/ Medical Decision Making/  A&P                           Medical Decision Making Amount and/or Complexity of Data Reviewed Labs: ordered. Radiology: ordered.  Risk Prescription drug management. Decision regarding hospitalization.   Patient with hyponatremia abdominal pain and shortness of breath.  She is admitted to medicine  {Document critical care time when appropriate:1} {Document review of labs and clinical decision tools ie heart score, Chads2Vasc2 etc:1}  {Document your independent review of radiology images, and any outside records:1} {Document your discussion with family members, caretakers, and with consultants:1} {Document social determinants of health affecting pt's care:1} {Document your decision making why or why not admission, treatments were needed:1} Final Clinical Impression(s) / ED Diagnoses Final diagnoses:  Pain of upper abdomen  Hyponatremia    Rx / DC Orders ED Discharge Orders     None

## 2021-11-02 NOTE — ED Triage Notes (Signed)
Pt to the ED with complaints of Abdominal pain and diarrhea for the past 3 days.   Pt also has shortness of breath with exertion.

## 2021-11-02 NOTE — ED Notes (Signed)
Pt alert, NAD, calm, interactive, resps e/u, speaking in clear complete sentences. Xray at [facility]

## 2021-11-02 NOTE — ED Notes (Signed)
Pt A/O NAD states pain 1/10 LQ' ABD

## 2021-11-03 DIAGNOSIS — I5043 Acute on chronic combined systolic (congestive) and diastolic (congestive) heart failure: Secondary | ICD-10-CM

## 2021-11-03 DIAGNOSIS — I495 Sick sinus syndrome: Secondary | ICD-10-CM

## 2021-11-03 DIAGNOSIS — R627 Adult failure to thrive: Secondary | ICD-10-CM

## 2021-11-03 DIAGNOSIS — I482 Chronic atrial fibrillation, unspecified: Secondary | ICD-10-CM | POA: Diagnosis not present

## 2021-11-03 DIAGNOSIS — I10 Essential (primary) hypertension: Secondary | ICD-10-CM | POA: Diagnosis not present

## 2021-11-03 LAB — CBC
HCT: 36.8 % (ref 36.0–46.0)
Hemoglobin: 12 g/dL (ref 12.0–15.0)
MCH: 29.4 pg (ref 26.0–34.0)
MCHC: 32.6 g/dL (ref 30.0–36.0)
MCV: 90.2 fL (ref 80.0–100.0)
Platelets: 232 10*3/uL (ref 150–400)
RBC: 4.08 MIL/uL (ref 3.87–5.11)
RDW: 14.4 % (ref 11.5–15.5)
WBC: 10 10*3/uL (ref 4.0–10.5)
nRBC: 0 % (ref 0.0–0.2)

## 2021-11-03 LAB — BASIC METABOLIC PANEL
Anion gap: 7 (ref 5–15)
Anion gap: 5 (ref 5–15)
BUN: 10 mg/dL (ref 8–23)
BUN: 9 mg/dL (ref 8–23)
CO2: 22 mmol/L (ref 22–32)
CO2: 26 mmol/L (ref 22–32)
Calcium: 8.3 mg/dL — ABNORMAL LOW (ref 8.9–10.3)
Calcium: 8.5 mg/dL — ABNORMAL LOW (ref 8.9–10.3)
Chloride: 97 mmol/L — ABNORMAL LOW (ref 98–111)
Chloride: 98 mmol/L (ref 98–111)
Creatinine, Ser: 0.68 mg/dL (ref 0.44–1.00)
Creatinine, Ser: 0.71 mg/dL (ref 0.44–1.00)
GFR, Estimated: >60 mL/min (ref >60)
GFR, Estimated: >60 mL/min (ref >60)
Glucose, Bld: 139 mg/dL — ABNORMAL HIGH (ref 70–99)
Glucose, Bld: 118 mg/dL — ABNORMAL HIGH (ref 70–99)
Potassium: 4.2 mmol/L (ref 3.5–5.1)
Potassium: 4.3 mmol/L (ref 3.5–5.1)
Sodium: 126 mmol/L — ABNORMAL LOW (ref 135–145)
Sodium: 129 mmol/L — ABNORMAL LOW (ref 135–145)

## 2021-11-03 LAB — T4, FREE: Free T4: 1.16 ng/dL — ABNORMAL HIGH (ref 0.61–1.12)

## 2021-11-03 LAB — OSMOLALITY, URINE: Osmolality, Ur: 383 mOsm/kg (ref 300–900)

## 2021-11-03 LAB — TSH: TSH: 1.154 u[IU]/mL (ref 0.350–4.500)

## 2021-11-03 LAB — PROCALCITONIN: Procalcitonin: <0.1 ng/mL

## 2021-11-03 LAB — FOLATE: Folate: 12 ng/mL (ref >5.9)

## 2021-11-03 LAB — OSMOLALITY: Osmolality: 275 mOsm/kg (ref 275–295)

## 2021-11-03 LAB — BRAIN NATRIURETIC PEPTIDE: B Natriuretic Peptide: 697 pg/mL — ABNORMAL HIGH (ref 0.0–100.0)

## 2021-11-03 LAB — VITAMIN B12: Vitamin B-12: 196 pg/mL (ref 180–914)

## 2021-11-03 MED ORDER — POLYETHYLENE GLYCOL 3350 17 G PO PACK: 17.0000 g | PACK | Freq: Every day | ORAL | Status: DC | PRN

## 2021-11-03 MED ORDER — ONDANSETRON HCL 4 MG PO TABS: 4.0000 mg | ORAL_TABLET | Freq: Four times a day (QID) | ORAL | Status: DC | PRN

## 2021-11-03 MED ORDER — VITAMIN B-12 1000 MCG PO TABS
500.0000 ug | ORAL_TABLET | Freq: Every day | ORAL | Status: DC
  Administered 2021-11-04 – 2021-11-05 (×2): 500 ug via ORAL
  Filled 2021-11-03 (×2): qty 1
  Filled 2021-11-03: qty 5

## 2021-11-03 MED ORDER — CLONIDINE HCL 0.1 MG PO TABS
0.1000 mg | ORAL_TABLET | Freq: Three times a day (TID) | ORAL | Status: DC
  Administered 2021-11-03 – 2021-11-04 (×4): 0.1 mg via ORAL
  Filled 2021-11-03 (×4): qty 1

## 2021-11-03 MED ORDER — DOCUSATE SODIUM 100 MG PO CAPS
100.0000 mg | ORAL_CAPSULE | Freq: Two times a day (BID) | ORAL | Status: DC
  Administered 2021-11-03 – 2021-11-05 (×5): 100 mg via ORAL
  Filled 2021-11-03 (×5): qty 1

## 2021-11-03 MED ORDER — SODIUM CHLORIDE 0.9 % IV SOLN: INTRAVENOUS | Status: DC

## 2021-11-03 MED ORDER — APIXABAN 2.5 MG PO TABS
2.5000 mg | ORAL_TABLET | Freq: Two times a day (BID) | ORAL | Status: DC
  Administered 2021-11-03 – 2021-11-05 (×6): 2.5 mg via ORAL
  Filled 2021-11-03 (×6): qty 1

## 2021-11-03 MED ORDER — FUROSEMIDE 10 MG/ML IJ SOLN
40.0000 mg | Freq: Once | INTRAMUSCULAR | Status: AC
  Administered 2021-11-03: 40 mg via INTRAVENOUS
  Filled 2021-11-03: qty 4

## 2021-11-03 MED ORDER — DILTIAZEM HCL ER COATED BEADS 120 MG PO CP24
120.0000 mg | ORAL_CAPSULE | Freq: Every morning | ORAL | Status: DC
  Administered 2021-11-03 – 2021-11-05 (×3): 120 mg via ORAL
  Filled 2021-11-03 (×3): qty 1

## 2021-11-03 MED ORDER — ONDANSETRON HCL 4 MG/2ML IJ SOLN
4.0000 mg | Freq: Four times a day (QID) | INTRAMUSCULAR | Status: DC | PRN
  Administered 2021-11-04: 4 mg via INTRAVENOUS
  Filled 2021-11-03: qty 2

## 2021-11-03 MED ORDER — ROSUVASTATIN CALCIUM 10 MG PO TABS
5.0000 mg | ORAL_TABLET | Freq: Every morning | ORAL | Status: DC
  Administered 2021-11-03 – 2021-11-05 (×3): 5 mg via ORAL
  Filled 2021-11-03 (×3): qty 1

## 2021-11-03 MED ORDER — METOPROLOL SUCCINATE ER 25 MG PO TB24
25.0000 mg | ORAL_TABLET | Freq: Two times a day (BID) | ORAL | Status: DC
  Administered 2021-11-03 – 2021-11-05 (×6): 25 mg via ORAL
  Filled 2021-11-03 (×6): qty 1

## 2021-11-03 MED ORDER — CYANOCOBALAMIN 1000 MCG/ML IJ SOLN
1000.0000 ug | Freq: Once | INTRAMUSCULAR | Status: AC
  Administered 2021-11-03: 1000 ug via INTRAMUSCULAR
  Filled 2021-11-03: qty 1

## 2021-11-03 MED ORDER — HYDROMORPHONE HCL 1 MG/ML IJ SOLN
0.2500 mg | INTRAMUSCULAR | Status: DC | PRN
  Administered 2021-11-03 (×2): 0.25 mg via INTRAVENOUS
  Filled 2021-11-03 (×2): qty 0.5

## 2021-11-03 MED ORDER — ENSURE ENLIVE PO LIQD
237.0000 mL | Freq: Two times a day (BID) | ORAL | Status: DC
Start: 2021-11-03 — End: 2021-11-05
  Administered 2021-11-03 – 2021-11-05 (×5): 237 mL via ORAL

## 2021-11-03 NOTE — Assessment & Plan Note (Signed)
No chest pain presently Continue Crestor

## 2021-11-03 NOTE — Hospital Course (Signed)
88 year old female with chronic atrial fibrillation, tachybradycardia syndrome status post pacemaker, hypertension,  hx AAA s/p endovascular repair, CAD sp PCI >1 yr presents with 1 week history of generalized weakness, malaise.  She states that she has been having some loose stools for the better part of the week.  She presents with lower abdominal pain of 1 day duration.   She denies any vomiting but has been having some nausea.  There is no hematochezia or melena. She denies any fevers, chills, chest pain but she has been having some shortness of breath.  She has had a nonproductive cough. The patient had a recent hospital admission from 09/18/2021 to 09/21/2021 at [facility] when she was treated for TIA.  MRI could not be done since patient has a pacemaker in place. CTA head and neck with contrast reveals the common carotid and internal carotid arteries are patent within the neck without hemodynamically significant stenosis (50% or greater).   During the hospitalization, the patient was also noted to have hyponatremia which was felt to be due to volume depletion which improved with IV fluids.  She also had episodes of sundowning and agitation which required Haldol.  In the ED, the patient was afebrile and hemodynamically stable. Sodium was noted to be 127, potassium 4.4, serum creatinine 0.72.  CTA chest was negative for dissection or PE.  There were small bilateral pleural effusions, right greater than left.  There is mild interstitial edema.  CT of the abdomen and pelvis showed a stable 1 cm pancreatic cyst.  She has a AAA with stent status post graft therapy that was stable.

## 2021-11-03 NOTE — Assessment & Plan Note (Signed)
Continue apixaban and Crestor No focal deficits

## 2021-11-03 NOTE — Assessment & Plan Note (Signed)
PT eval>>HHPT B12--196 Folate--12.0 TSH--1.154 Supplement B12 and folate

## 2021-11-03 NOTE — Assessment & Plan Note (Signed)
Suspect this is due to fluid overload Received IV lasix on 7/30 and 7/31 with clinical improvement>>>Na improved 126>>133 Discontinue NaCl tabs

## 2021-11-03 NOTE — Assessment & Plan Note (Signed)
Continue statin. 

## 2021-11-03 NOTE — Assessment & Plan Note (Signed)
Patient presented with dyspnea with bilateral pleural effusions on CTA chest and interstitial edema -Lasix IV x1--repeat 7/31 -Hold IV fluids -05/06/2021 echo EF 40 to 45%, no WMA, grade 2 DD -Accurate I's and O's -repeat echo--

## 2021-11-03 NOTE — Assessment & Plan Note (Addendum)
Etiology unclear -CT abdomen and pelvis--negative for acute findings -Judicious opioids -stool pathogen panel as pt is complaining of loose stool -overall improved, tolerating diet -loose stool improved

## 2021-11-03 NOTE — Assessment & Plan Note (Addendum)
Rate controlled Continue Cardizem CD and metoprolol succinate Continue apixaban

## 2021-11-03 NOTE — Assessment & Plan Note (Addendum)
Continue diltiazem and metoprolol succinate disContinue clonidine to allow BP margin

## 2021-11-03 NOTE — Progress Notes (Signed)
PROGRESS NOTE  Karina [name] KKX:[id number] DOB: [birth year] DOA: 11/02/2021 PCP: [name], MD  Brief History:  88 year old female with chronic atrial fibrillation, tachybradycardia syndrome status post pacemaker, hypertension,  hx AAA s/p endovascular repair, CAD sp PCI >1 yr presents with 1 week history of generalized weakness, malaise.  She states that she has been having some loose stools for the better part of the week.  She presents with lower abdominal pain of 1 day duration.   She denies any vomiting but has been having some nausea.  There is no hematochezia or melena. She denies any fevers, chills, chest pain but she has been having some shortness of breath.  She has had a nonproductive cough. The patient had a recent hospital admission from 09/18/2021 to 09/21/2021 at [facility] when she was treated for TIA.  MRI could not be done since patient has a pacemaker in place. CTA head and neck with contrast reveals the common carotid and internal carotid arteries are patent within the neck without hemodynamically significant stenosis (50% or greater).   During the hospitalization, the patient was also noted to have hyponatremia which was felt to be due to volume depletion which improved with IV fluids.  She also had episodes of sundowning and agitation which required Haldol.  In the ED, the patient was afebrile and hemodynamically stable. Sodium was noted to be 127, potassium 4.4, serum creatinine 0.72.  CTA chest was negative for dissection or PE.  There were small bilateral pleural effusions, right greater than left.  There is mild interstitial edema.  CT of the abdomen and pelvis showed a stable 1 cm pancreatic cyst.  She has a AAA with stent status post graft therapy that was stable.    Assessment and Plan: * Abdominal pain Etiology unclear -CT abdomen and pelvis--negative for acute findings -Judicious opioids -stool pathogen panel as pt is complaining of loose stool  Failure to  thrive in adult PT eval B12 Folate TSH  Acute on chronic combined systolic and diastolic CHF (congestive heart failure) ([place]) Patient presented with dyspnea with bilateral pleural effusions on CTA chest and interstitial edema -Lasix IV x1 and reassess -Hold IV fluids -05/06/2021 echo EF 40 to 45%, no WMA, grade 2 DD -Accurate I's and O's  Coronary artery disease involving native coronary artery of native heart without angina pectoris No chest pain presently Continue Crestor  History of TIA (transient ischemic attack) Continue apixaban and Crestor No focal deficits  Chronic a-fib (HCC) Rate controlled Continue Cardizem CD and metoprolol succinate Continue apixaban  Tachy-brady syndrome (HCC) Status post PPM Follow-up Dr. [name]  Hyponatremia Suspect this is due to fluid overload Furosemide IV x1 and re-assess  Dyslipidemia Continue statin  Essential hypertension Continue diltiazem and metoprolol succinate Continue clonidine          Family Communication:   Family at bedside  Consultants:  none  Code Status: DNR  DVT Prophylaxis:  apixaban   Procedures: As Listed in Progress Note Above  Antibiotics: None   Subjective: Patient complains of generalized weakness.  Denies f/c, cp, vomiting, diarrhea, hematochezia, melena.    Objective: Vitals:   11/02/21 2330 11/03/21 0000 11/03/21 0101 11/03/21 0553  BP: (!) 155/114 (!) 160/103 (!) 164/109 (!) 158/101  Pulse: 81 88 88 81  Resp: '14 10 16 20  '$ Temp: 98.4 F (36.9 C)  97.6 F (36.4 C) 98.1 F (36.7 C)  TempSrc: Oral     SpO2: 99%  96% 95% 92%  Weight:      Height:        Intake/Output Summary (Last 24 hours) at 11/03/2021 0854 Last data filed at 11/03/2021 8119 Gross per 24 hour  Intake 908.56 ml  Output --  Net 908.56 ml   Weight change:  Exam:  General:  Pt is alert, follows commands appropriately, not in acute distress HEENT: No icterus, No thrush, No neck mass,  [place]/AT Cardiovascular: RRR, S1/S2, no rubs, no gallops Respiratory: bibasilar rales Abdomen: Soft/+BS, non tender, non distended, no guarding Extremities: No edema, No lymphangitis, No petechiae, No rashes, no synovitis   Data Reviewed: I have personally reviewed following labs and imaging studies Basic Metabolic Panel: Recent Labs  Lab 11/02/21 1841 11/03/21 0148 11/03/21 0611  NA 127* 126* 129*  K 4.4 4.2 4.3  CL 95* 97* 98  CO2 '25 22 26  '$ GLUCOSE 117* 139* 118*  BUN '11 10 9  '$ CREATININE 0.72 0.68 0.71  CALCIUM 8.8* 8.3* 8.5*   Liver Function Tests: Recent Labs  Lab 11/02/21 1841  AST 28  ALT 23  ALKPHOS 88  BILITOT 1.0  PROT 6.1*  ALBUMIN 3.5   No results for input(s): "LIPASE", "AMYLASE" in the last 168 hours. No results for input(s): "AMMONIA" in the last 168 hours. Coagulation Profile: No results for input(s): "INR", "PROTIME" in the last 168 hours. CBC: Recent Labs  Lab 11/02/21 1841 11/03/21 0611  WBC 8.8 10.0  NEUTROABS 6.2  --   HGB 11.7* 12.0  HCT 35.2* 36.8  MCV 89.6 90.2  PLT 235 232   Cardiac Enzymes: No results for input(s): "CKTOTAL", "CKMB", "CKMBINDEX", "TROPONINI" in the last 168 hours. BNP: Invalid input(s): "POCBNP" CBG: No results for input(s): "GLUCAP" in the last 168 hours. HbA1C: No results for input(s): "HGBA1C" in the last 72 hours. Urine analysis:    Component Value Date/Time   COLORURINE STRAW (A) 11/02/2021 2142   APPEARANCEUR CLEAR 11/02/2021 2142   LABSPEC 1.026 11/02/2021 2142   PHURINE 7.0 11/02/2021 2142   GLUCOSEU NEGATIVE 11/02/2021 2142   HGBUR NEGATIVE 11/02/2021 2142   BILIRUBINUR NEGATIVE 11/02/2021 2142   KETONESUR NEGATIVE 11/02/2021 2142   PROTEINUR NEGATIVE 11/02/2021 2142   UROBILINOGEN 0.2 01/18/2015 2010   NITRITE NEGATIVE 11/02/2021 2142   LEUKOCYTESUR NEGATIVE 11/02/2021 2142   Sepsis Labs: '@LABRCNTIP'$ (procalcitonin:4,lacticidven:4) )No results found for this or any previous visit (from the past  240 hour(s)).   Scheduled Meds:  apixaban  2.5 mg Oral BID   cloNIDine  0.1 mg Oral TID   diltiazem  120 mg Oral q morning   docusate sodium  100 mg Oral BID   feeding supplement  237 mL Oral BID BM   metoprolol succinate  25 mg Oral BID   rosuvastatin  5 mg Oral q morning   Continuous Infusions:  sodium chloride 75 mL/hr at 11/03/21 1478    Procedures/Studies: CT Angio Chest PE W and/or Wo Contrast  Result Date: 11/02/2021 CLINICAL DATA:  Shortness of breath on exertion with abdominal pain and diarrhea for several days, initial encounter EXAM: CT ANGIOGRAPHY CHEST CT ABDOMEN AND PELVIS WITH CONTRAST TECHNIQUE: Multidetector CT imaging of the chest was performed using the standard protocol during bolus administration of intravenous contrast. Multiplanar CT image reconstructions and MIPs were obtained to evaluate the vascular anatomy. Multidetector CT imaging of the abdomen and pelvis was performed using the standard protocol during bolus administration of intravenous contrast. RADIATION DOSE REDUCTION: This exam was performed according to the departmental dose-optimization  program which includes automated exposure control, adjustment of the mA and/or kV according to patient size and/or use of iterative reconstruction technique. CONTRAST:  21m OMNIPAQUE IOHEXOL 350 MG/ML SOLN COMPARISON:  Plain film from earlier in the same day, CT from 05/07/2021. FINDINGS: CTA CHEST FINDINGS Cardiovascular: Thoracic aorta demonstrates atherosclerotic calcifications without aneurysmal dilatation. No significant aortic opacification is identified to evaluate for dissection. The pulmonary artery shows a normal branching pattern bilaterally. No filling defect to suggest pulmonary embolism is noted. Coronary calcifications are seen. The heart is enlarged in size. Pacing device is noted in place. Mediastinum/Nodes: Thoracic inlet is within normal limits. No sizable hilar or mediastinal adenopathy is noted. The  esophagus is within normal limits. Lungs/Pleura: Small effusions are noted bilaterally right greater than left. Basilar atelectasis is seen right greater than left as well. No focal confluent infiltrate is seen. No sizable parenchymal nodules are noted. Mild interstitial edema is noted as well. Musculoskeletal: Degenerative changes of the thoracic spine are noted. No acute rib abnormality is seen. Chronic appearing compression deformities of T11 and T12 are noted. These are stable from a prior exam from 05/07/2021 Review of the MIP images confirms the above findings. CT ABDOMEN and PELVIS FINDINGS Hepatobiliary: Gallbladder has been surgically removed. The liver is within normal limits. Pancreas: Stable midbody pancreatic cyst is noted measuring 1 cm. Remainder of the pancreas is within normal limits. Spleen: Normal in size without focal abnormality. Adrenals/Urinary Tract: Adrenal glands are within normal limits. Kidneys demonstrate a normal enhancement pattern bilaterally. Normal excretion is noted on delayed images. No renal calculi or obstructive changes are seen. The bladder is well distended. Stomach/Bowel: Scattered diverticular change of the colon is noted without evidence of diverticulitis. Changes of prior appendectomy are noted. Small bowel and stomach are within normal limits. Vascular/Lymphatic: There are changes consistent with abdominal aortic aneurysm with prior stent graft therapy. No findings to suggest endoleak are identified. The overall appearance of the aneurysm is stable. The sac measures approximately 6.3 cm. Stent graft is widely patent with runoff into the iliac vessels bilaterally. Stable left iliac aneurysm is noted no sizable lymphadenopathy is noted. Reproductive: Status post hysterectomy. No adnexal masses. Other: Postsurgical changes are noted in the inguinal regions bilaterally stable from the prior exam. No free fluid is noted. No focal hernia is seen. Musculoskeletal: Degenerative  changes of the lumbar spine are noted as well as multilevel fusion with posterior fixation. Right hip replacement is noted as well. Review of the MIP images confirms the above findings. IMPRESSION: CTA of the chest: No evidence of pulmonary emboli. Bilateral pleural effusions with basilar atelectasis. Chronic compression deformities of T11 and T12 stable from the prior exam. CT of the abdomen and pelvis: Diverticulosis without diverticulitis. Abdominal aortic aneurysm with evidence of stent graft therapy without significant change in the size of the aneurysm sac. No endoleak is identified. Stable pancreatic cyst in the mid body. No further follow-up is recommended given the patient's age. Electronically Signed   By: MInez CatalinaM.D.   On: 11/02/2021 20:33   CT ABDOMEN PELVIS W CONTRAST  Result Date: 11/02/2021 CLINICAL DATA:  Shortness of breath on exertion with abdominal pain and diarrhea for several days, initial encounter EXAM: CT ANGIOGRAPHY CHEST CT ABDOMEN AND PELVIS WITH CONTRAST TECHNIQUE: Multidetector CT imaging of the chest was performed using the standard protocol during bolus administration of intravenous contrast. Multiplanar CT image reconstructions and MIPs were obtained to evaluate the vascular anatomy. Multidetector CT imaging of the abdomen and  pelvis was performed using the standard protocol during bolus administration of intravenous contrast. RADIATION DOSE REDUCTION: This exam was performed according to the departmental dose-optimization program which includes automated exposure control, adjustment of the mA and/or kV according to patient size and/or use of iterative reconstruction technique. CONTRAST:  11m OMNIPAQUE IOHEXOL 350 MG/ML SOLN COMPARISON:  Plain film from earlier in the same day, CT from 05/07/2021. FINDINGS: CTA CHEST FINDINGS Cardiovascular: Thoracic aorta demonstrates atherosclerotic calcifications without aneurysmal dilatation. No significant aortic opacification is  identified to evaluate for dissection. The pulmonary artery shows a normal branching pattern bilaterally. No filling defect to suggest pulmonary embolism is noted. Coronary calcifications are seen. The heart is enlarged in size. Pacing device is noted in place. Mediastinum/Nodes: Thoracic inlet is within normal limits. No sizable hilar or mediastinal adenopathy is noted. The esophagus is within normal limits. Lungs/Pleura: Small effusions are noted bilaterally right greater than left. Basilar atelectasis is seen right greater than left as well. No focal confluent infiltrate is seen. No sizable parenchymal nodules are noted. Mild interstitial edema is noted as well. Musculoskeletal: Degenerative changes of the thoracic spine are noted. No acute rib abnormality is seen. Chronic appearing compression deformities of T11 and T12 are noted. These are stable from a prior exam from 05/07/2021 Review of the MIP images confirms the above findings. CT ABDOMEN and PELVIS FINDINGS Hepatobiliary: Gallbladder has been surgically removed. The liver is within normal limits. Pancreas: Stable midbody pancreatic cyst is noted measuring 1 cm. Remainder of the pancreas is within normal limits. Spleen: Normal in size without focal abnormality. Adrenals/Urinary Tract: Adrenal glands are within normal limits. Kidneys demonstrate a normal enhancement pattern bilaterally. Normal excretion is noted on delayed images. No renal calculi or obstructive changes are seen. The bladder is well distended. Stomach/Bowel: Scattered diverticular change of the colon is noted without evidence of diverticulitis. Changes of prior appendectomy are noted. Small bowel and stomach are within normal limits. Vascular/Lymphatic: There are changes consistent with abdominal aortic aneurysm with prior stent graft therapy. No findings to suggest endoleak are identified. The overall appearance of the aneurysm is stable. The sac measures approximately 6.3 cm. Stent graft  is widely patent with runoff into the iliac vessels bilaterally. Stable left iliac aneurysm is noted no sizable lymphadenopathy is noted. Reproductive: Status post hysterectomy. No adnexal masses. Other: Postsurgical changes are noted in the inguinal regions bilaterally stable from the prior exam. No free fluid is noted. No focal hernia is seen. Musculoskeletal: Degenerative changes of the lumbar spine are noted as well as multilevel fusion with posterior fixation. Right hip replacement is noted as well. Review of the MIP images confirms the above findings. IMPRESSION: CTA of the chest: No evidence of pulmonary emboli. Bilateral pleural effusions with basilar atelectasis. Chronic compression deformities of T11 and T12 stable from the prior exam. CT of the abdomen and pelvis: Diverticulosis without diverticulitis. Abdominal aortic aneurysm with evidence of stent graft therapy without significant change in the size of the aneurysm sac. No endoleak is identified. Stable pancreatic cyst in the mid body. No further follow-up is recommended given the patient's age. Electronically Signed   By: MInez CatalinaM.D.   On: 11/02/2021 20:33   DG Chest Port 1 View  Result Date: 11/02/2021 CLINICAL DATA:  Shortness of breath EXAM: PORTABLE CHEST 1 VIEW COMPARISON:  Chest x-ray 09/18/2021 FINDINGS: Heart is enlarged, unchanged. There are atherosclerotic calcifications of the aorta. Left-sided pacemaker is unchanged in position. There is some minimal patchy opacities in both  lung bases, left greater than right. No pleural effusion or pneumothorax. No acute fractures. Lumbar fusion hardware is partially visualized. IMPRESSION: 1. Bibasilar atelectasis/airspace disease, left greater than right. Pneumonia is a possibility in the left lung base. 2. Stable cardiomegaly. Electronically Signed   By: Ronney Asters M.D.   On: 11/02/2021 19:12    Orson Eva, DO  [facility]  If 7PM-7AM, please contact  night-coverage [email or web address] Password [email or web address] 11/03/2021, 8:54 AM   LOS: 1 day

## 2021-11-03 NOTE — Assessment & Plan Note (Signed)
Status post PPM Follow-up Dr. [name]

## 2021-11-04 ENCOUNTER — Other Ambulatory Visit (HOSPITAL_COMMUNITY): Payer: Self-pay | Admitting: *Deleted

## 2021-11-04 ENCOUNTER — Inpatient Hospital Stay (HOSPITAL_COMMUNITY): Payer: Medicare Other

## 2021-11-04 DIAGNOSIS — I5043 Acute on chronic combined systolic (congestive) and diastolic (congestive) heart failure: Secondary | ICD-10-CM | POA: Diagnosis not present

## 2021-11-04 DIAGNOSIS — R627 Adult failure to thrive: Secondary | ICD-10-CM | POA: Diagnosis not present

## 2021-11-04 DIAGNOSIS — I10 Essential (primary) hypertension: Secondary | ICD-10-CM | POA: Diagnosis not present

## 2021-11-04 DIAGNOSIS — I482 Chronic atrial fibrillation, unspecified: Secondary | ICD-10-CM | POA: Diagnosis not present

## 2021-11-04 LAB — ECHOCARDIOGRAM COMPLETE
AR max vel: 1.73 cm2
AV Area VTI: 2.06 cm2
AV Area mean vel: 1.87 cm2
AV Mean grad: 3 mmHg
AV Peak grad: 6.4 mmHg
Ao pk vel: 1.26 m/s
Area-P 1/2: 5.11 cm2
Calc EF: 53.4 %
Height: 61.5 in
MV VTI: 2.01 cm2
P 1/2 time: 335 msec
S' Lateral: 2.5 cm
Single Plane A2C EF: 52.8 %
Single Plane A4C EF: 51.6 %
Weight: 1840 oz

## 2021-11-04 LAB — BASIC METABOLIC PANEL
Anion gap: 5 (ref 5–15)
BUN: 15 mg/dL (ref 8–23)
CO2: 29 mmol/L (ref 22–32)
Calcium: 9 mg/dL (ref 8.9–10.3)
Chloride: 97 mmol/L — ABNORMAL LOW (ref 98–111)
Creatinine, Ser: 0.81 mg/dL (ref 0.44–1.00)
GFR, Estimated: >60 mL/min (ref >60)
Glucose, Bld: 120 mg/dL — ABNORMAL HIGH (ref 70–99)
Potassium: 4 mmol/L (ref 3.5–5.1)
Sodium: 131 mmol/L — ABNORMAL LOW (ref 135–145)

## 2021-11-04 LAB — MAGNESIUM: Magnesium: 1.8 mg/dL (ref 1.7–2.4)

## 2021-11-04 MED ORDER — MAGNESIUM SULFATE 2 GM/50ML IV SOLN
2.0000 g | Freq: Once | INTRAVENOUS | Status: AC
  Administered 2021-11-04: 2 g via INTRAVENOUS
  Filled 2021-11-04: qty 50

## 2021-11-04 MED ORDER — FUROSEMIDE 10 MG/ML IJ SOLN
40.0000 mg | Freq: Once | INTRAMUSCULAR | Status: AC
  Administered 2021-11-04: 40 mg via INTRAVENOUS
  Filled 2021-11-04: qty 4

## 2021-11-04 NOTE — Progress Notes (Signed)
*  PRELIMINARY RESULTS* Echocardiogram 2D Echocardiogram has been performed.  Karina [name] 11/04/2021, 4:00 PM

## 2021-11-04 NOTE — Progress Notes (Signed)
  Transition of Care [facility]) Screening Note   Patient Details  Name: Karina [name] Date of Birth: 1922/07/03   [facility]) CM/SW Contact:    Ihor Gully, LCSW Phone Number: 11/04/2021, 2:01 PM    Transition of Care Department [facility]) has reviewed patient and no TOC needs have been identified at this time. We will continue to monitor patient advancement through interdisciplinary progression rounds. If new patient transition needs arise, please place a TOC consult.

## 2021-11-04 NOTE — Plan of Care (Signed)
  Problem: Acute Rehab PT Goals(only PT should resolve) Goal: Pt Will Go Supine/Side To Sit Outcome: Progressing Flowsheets (Taken 11/04/2021 1632) Pt will go Supine/Side to Sit:  Independently  with modified independence Goal: Patient Will Transfer Sit To/From Stand Outcome: Progressing Flowsheets (Taken 11/04/2021 1632) Patient will transfer sit to/from stand: with modified independence Goal: Pt Will Transfer Bed To Chair/Chair To Bed Outcome: Progressing Flowsheets (Taken 11/04/2021 1632) Pt will Transfer Bed to Chair/Chair to Bed: with modified independence Goal: Pt Will Ambulate 11/04/2021 1632 by Lonell Grandchild, PT Flowsheets (Taken 11/04/2021 1632) Pt will Ambulate:  > 125 feet  with modified independence  with rolling walker 11/04/2021 1632 by Lonell Grandchild, PT Outcome: Progressing Flowsheets (Taken 11/04/2021 1632) Pt will Ambulate:  > 125 feet  with modified independence  with rolling walker   4:32 PM, 11/04/21 Lonell Grandchild, MPT Physical Therapist with [facility] [phone] office [phone] mobile phone

## 2021-11-04 NOTE — Progress Notes (Signed)
PT Cancellation Note  Patient Details Name: Karina [name] MRN: [id number] DOB: [birth year]   Cancelled Treatment:    Reason Eval/Treat Not Completed: Patient at procedure or test/unavailable.  Will check back tomorrow in AM.   3:42 PM, 11/04/21 Lonell Grandchild, MPT Physical Therapist with [facility] [phone] office [phone] mobile phone

## 2021-11-04 NOTE — Evaluation (Signed)
Physical Therapy Evaluation Patient Details Name: Karina [name] MRN: [id number] DOB: May 17, 1922 Today's Date: 11/04/2021  History of Present Illness  Karina [name] is a 88 y.o. female with medical history significant of coronary artery disease with combined systolic, diastolic heart failure, paroxysmal atrial fibrillation on Eliquis, hypertension, hyperlipidemia.  Patient seen for a week of increasing abdominal pain in her lower abdomen with diminished appetite.  Her pain is generally fairly mild although becomes quite severe at times.  No changes to stooling habits.  Patient has not been drinking hardly any water at this point and has not been eating much.  She has been taking her medicines as prescribed.   Clinical Impression  Patient functioning near baseline for functional mobility and gait demonstrating good return for ambulating in room and hallways using RW and bilateral AFO's with shoes without loss of balance.  Patient demonstrates good return for transferring to/from commode in bathroom and tolerated sitting up in chair after therapy - RN notified.  Patient will benefit from continued skilled physical therapy in hospital and recommended venue below to increase strength, balance, endurance for safe ADLs and gait.         Recommendations for follow up therapy are one component of a multi-disciplinary discharge planning process, led by the attending physician.  Recommendations may be updated based on patient status, additional functional criteria and insurance authorization.  Follow Up Recommendations Home health PT      Assistance Recommended at Discharge Set up Supervision/Assistance  Patient can return home with the following  A little help with walking and/or transfers;A little help with bathing/dressing/bathroom;Assistance with cooking/housework;Help with stairs or ramp for entrance    Equipment Recommendations None recommended by PT  Recommendations for Other Services        Functional Status Assessment Patient has had a recent decline in their functional status and demonstrates the ability to make significant improvements in function in a reasonable and predictable amount of time.     Precautions / Restrictions Precautions Precautions: Fall Restrictions Weight Bearing Restrictions: No      Mobility  Bed Mobility Overal bed mobility: Modified Independent                  Transfers Overall transfer level: Modified independent                 General transfer comment: good return for transferring to/from commode using RW    Ambulation/Gait Ambulation/Gait assistance: Supervision Gait Distance (Feet): 150 Feet Assistive device: Rolling walker (2 wheels) Gait Pattern/deviations: Decreased step length - right, Decreased step length - left, Decreased stride length Gait velocity: decreased     General Gait Details: slightly labored cadence without loss of balance using bilateral AFO and shoes due to chronic bilateral foot drop  Stairs            Wheelchair Mobility    Modified Rankin (Stroke Patients Only)       Balance Overall balance assessment: Needs assistance Sitting-balance support: Feet supported, No upper extremity supported Sitting balance-Leahy Scale: Good Sitting balance - Comments: seated at EOB   Standing balance support: During functional activity, Bilateral upper extremity supported Standing balance-Leahy Scale: Fair Standing balance comment: fair/good using RW                             Pertinent Vitals/Pain Pain Assessment Pain Assessment: No/denies pain    Home Living Family/patient expects to be discharged to:: Private residence Living  Arrangements: Alone Available Help at Discharge: Friend(s);Personal care attendant;Available PRN/intermittently Type of Home: House Home Access: Stairs to enter Entrance Stairs-Rails: Right Entrance Stairs-Number of Steps: 2 Alternate Level  Stairs-Number of Steps: uses stair lift to get to 2nd floor Home Layout: Multi-level Home Equipment: Conservation officer, nature (2 wheels);Cane - single point;Grab bars - toilet;Grab bars - tub/shower;Rollator (4 wheels)      Prior Function Prior Level of Function : Needs assist       Physical Assist : Mobility (physical);ADLs (physical) Mobility (physical): Bed mobility;Transfers;Stairs;Gait   Mobility Comments: household and short distanced community ambulator, occasionally drives ADLs Comments: home aides from 8-9 PM 5 days/week, half day on weekend     Hand Dominance   Dominant Hand: Right    Extremity/Trunk Assessment   Upper Extremity Assessment Upper Extremity Assessment: Overall WFL for tasks assessed    Lower Extremity Assessment Lower Extremity Assessment: Generalized weakness    Cervical / Trunk Assessment Cervical / Trunk Assessment: Normal  Communication   Communication: HOH  Cognition Arousal/Alertness: Awake/alert Behavior During Therapy: WFL for tasks assessed/performed Overall Cognitive Status: Within Functional Limits for tasks assessed                                          General Comments      Exercises     Assessment/Plan    PT Assessment Patient needs continued PT services  PT Problem List Decreased strength;Decreased activity tolerance;Decreased balance;Decreased mobility       PT Treatment Interventions DME instruction;Gait training;Stair training;Functional mobility training;Therapeutic activities;Therapeutic exercise;Balance training;Patient/family education    PT Goals (Current goals can be found in the Care Plan section)  Acute Rehab PT Goals Patient Stated Goal: return home with home aides to assist PT Goal Formulation: With patient Time For Goal Achievement: 11/06/21 Potential to Achieve Goals: Good    Frequency Min 3X/week     Co-evaluation               AM-PAC PT "6 Clicks" Mobility  Outcome Measure  Help needed turning from your back to your side while in a flat bed without using bedrails?: None Help needed moving from lying on your back to sitting on the side of a flat bed without using bedrails?: None Help needed moving to and from a bed to a chair (including a wheelchair)?: A Little Help needed standing up from a chair using your arms (e.g., wheelchair or bedside chair)?: A Little Help needed to walk in hospital room?: A Little Help needed climbing 3-5 steps with a railing? : A Little 6 Click Score: 20    End of Session   Activity Tolerance: Patient tolerated treatment well;Patient limited by fatigue Patient left: in chair;with call bell/phone within reach Nurse Communication: Mobility status PT Visit Diagnosis: Unsteadiness on feet (R26.81);Other abnormalities of gait and mobility (R26.89);Muscle weakness (generalized) (M62.81)    Time: 0630-1601 PT Time Calculation (min) (ACUTE ONLY): 32 min   Charges:   PT Evaluation $PT Eval Moderate Complexity: 1 Mod PT Treatments $Therapeutic Activity: 23-37 mins        4:30 PM, 11/04/21 Lonell Grandchild, MPT Physical Therapist with [facility] [phone] office [phone] mobile phone

## 2021-11-04 NOTE — Progress Notes (Signed)
PROGRESS NOTE  Karina [name] IDP:[id number] DOB: April 05, 1923 DOA: 11/02/2021 PCP: [name], MD  Brief History:  88 year old female with chronic atrial fibrillation, tachybradycardia syndrome status post pacemaker, hypertension,  hx AAA s/p endovascular repair, CAD sp PCI >1 yr presents with 1 week history of generalized weakness, malaise.  She states that she has been having some loose stools for the better part of the week.  She presents with lower abdominal pain of 1 day duration.   She denies any vomiting but has been having some nausea.  There is no hematochezia or melena. She denies any fevers, chills, chest pain but she has been having some shortness of breath.  She has had a nonproductive cough. The patient had a recent hospital admission from 09/18/2021 to 09/21/2021 at [facility] when she was treated for TIA.  MRI could not be done since patient has a pacemaker in place. CTA head and neck with contrast reveals the common carotid and internal carotid arteries are patent within the neck without hemodynamically significant stenosis (50% or greater).   During the hospitalization, the patient was also noted to have hyponatremia which was felt to be due to volume depletion which improved with IV fluids.  She also had episodes of sundowning and agitation which required Haldol.  In the ED, the patient was afebrile and hemodynamically stable. Sodium was noted to be 127, potassium 4.4, serum creatinine 0.72.  CTA chest was negative for dissection or PE.  There were small bilateral pleural effusions, right greater than left.  There is mild interstitial edema.  CT of the abdomen and pelvis showed a stable 1 cm pancreatic cyst.  She has a AAA with stent status post graft therapy that was stable.     Assessment and Plan: * Abdominal pain Etiology unclear -CT abdomen and pelvis--negative for acute findings -Judicious opioids -stool pathogen panel as pt is complaining of loose stool -overall  improved, tolerating diet -loose stool improved  Failure to thrive in adult PT eval B12--196 Folate--12.0 TSH--1.154 Supplement B12 and folat  Acute on chronic combined systolic and diastolic CHF (congestive heart failure) ([place]) Patient presented with dyspnea with bilateral pleural effusions on CTA chest and interstitial edema -Lasix IV x1--repeat 7/31 -Hold IV fluids -05/06/2021 echo EF 40 to 45%, no WMA, grade 2 DD -Accurate I's and O's -repeat echo--  Coronary artery disease involving native coronary artery of native heart without angina pectoris No chest pain presently Continue Crestor  History of TIA (transient ischemic attack) Continue apixaban and Crestor No focal deficits  Chronic a-fib (HCC) Rate controlled Continue Cardizem CD and metoprolol succinate Continue apixaban  Tachy-brady syndrome ([place]) Status post PPM Follow-up Dr. [name]  Hyponatremia Suspect this is due to fluid overload Furosemide IV x1--repeat 7/31  Dyslipidemia Continue statin  Essential hypertension Continue diltiazem and metoprolol succinate disContinue clonidine to allow BP margin          Family Communication:   updated advocate Vivien Rota 7/31  Consultants:  none  Code Status:  DNR  DVT Prophylaxis:  apixaban   Procedures: As Listed in Progress Note Above  Antibiotics: None  RN Pressure Injury Documentation: Pressure Injury 05/06/21 Buttocks Left;Medial Stage 1 -  Intact skin with non-blanchable redness of a localized area usually over a bony prominence. red, non-blanchable (Active)  05/06/21 0000  Location: Buttocks  Location Orientation: Left;Medial  Staging: Stage 1 -  Intact skin with non-blanchable redness of a localized area usually over a  bony prominence.  Wound Description (Comments): red, non-blanchable  Present on Admission: Yes        Subjective: She is breathing better.  Patient denies fevers, chills, headache, chest pain, dyspnea, nausea,  vomiting, diarrhea, abdominal pain, dysuria, hematuria, hematochezia, and melena.   Objective: Vitals:   11/03/21 0553 11/03/21 1422 11/03/21 2031 11/04/21 0407  BP: (!) 158/101 (!) 148/86 (!) 167/93 127/74  Pulse: 81 80 81 80  Resp: '20 19 18 16  '$ Temp: 98.1 F (36.7 C) 98.4 F (36.9 C) 97.6 F (36.4 C) 97.9 F (36.6 C)  TempSrc:  Oral    SpO2: 92% 92% 94% 93%  Weight:      Height:        Intake/Output Summary (Last 24 hours) at 11/04/2021 1256 Last data filed at 11/04/2021 0500 Gross per 24 hour  Intake 480 ml  Output 3250 ml  Net -2770 ml   Weight change:  Exam:  General:  Pt is alert, follows commands appropriately, not in acute distress HEENT: No icterus, No thrush, No neck mass, [place]/AT Cardiovascular: RRR, S1/S2, no rubs, no gallops Respiratory: bibasilar crackles. No wheeze Abdomen: Soft/+BS, non tender, non distended, no guarding Extremities: trace LE edema, No lymphangitis, No petechiae, No rashes, no synovitis   Data Reviewed: I have personally reviewed following labs and imaging studies Basic Metabolic Panel: Recent Labs  Lab 11/02/21 1841 11/03/21 0148 11/03/21 0611 11/04/21 0719  NA 127* 126* 129* 131*  K 4.4 4.2 4.3 4.0  CL 95* 97* 98 97*  CO2 '25 22 26 29  '$ GLUCOSE 117* 139* 118* 120*  BUN '11 10 9 15  '$ CREATININE 0.72 0.68 0.71 0.81  CALCIUM 8.8* 8.3* 8.5* 9.0  MG  --   --   --  1.8   Liver Function Tests: Recent Labs  Lab 11/02/21 1841  AST 28  ALT 23  ALKPHOS 88  BILITOT 1.0  PROT 6.1*  ALBUMIN 3.5   No results for input(s): "LIPASE", "AMYLASE" in the last 168 hours. No results for input(s): "AMMONIA" in the last 168 hours. Coagulation Profile: No results for input(s): "INR", "PROTIME" in the last 168 hours. CBC: Recent Labs  Lab 11/02/21 1841 11/03/21 0611  WBC 8.8 10.0  NEUTROABS 6.2  --   HGB 11.7* 12.0  HCT 35.2* 36.8  MCV 89.6 90.2  PLT 235 232   Cardiac Enzymes: No results for input(s): "CKTOTAL", "CKMB", "CKMBINDEX",  "TROPONINI" in the last 168 hours. BNP: Invalid input(s): "POCBNP" CBG: No results for input(s): "GLUCAP" in the last 168 hours. HbA1C: No results for input(s): "HGBA1C" in the last 72 hours. Urine analysis:    Component Value Date/Time   COLORURINE STRAW (A) 11/02/2021 2142   APPEARANCEUR CLEAR 11/02/2021 2142   LABSPEC 1.026 11/02/2021 2142   PHURINE 7.0 11/02/2021 2142   GLUCOSEU NEGATIVE 11/02/2021 2142   HGBUR NEGATIVE 11/02/2021 2142   BILIRUBINUR NEGATIVE 11/02/2021 2142   KETONESUR NEGATIVE 11/02/2021 2142   PROTEINUR NEGATIVE 11/02/2021 2142   UROBILINOGEN 0.2 01/18/2015 2010   NITRITE NEGATIVE 11/02/2021 2142   LEUKOCYTESUR NEGATIVE 11/02/2021 2142   Sepsis Labs: '@LABRCNTIP'$ (procalcitonin:4,lacticidven:4) )No results found for this or any previous visit (from the past 240 hour(s)).   Scheduled Meds:  apixaban  2.5 mg Oral BID   cyanocobalamin  500 mcg Oral Daily   diltiazem  120 mg Oral q morning   docusate sodium  100 mg Oral BID   feeding supplement  237 mL Oral BID BM   furosemide  40 mg  Intravenous Once   metoprolol succinate  25 mg Oral BID   rosuvastatin  5 mg Oral q morning   Continuous Infusions:  magnesium sulfate bolus IVPB      Procedures/Studies: CT Angio Chest PE W and/or Wo Contrast  Result Date: 11/02/2021 CLINICAL DATA:  Shortness of breath on exertion with abdominal pain and diarrhea for several days, initial encounter EXAM: CT ANGIOGRAPHY CHEST CT ABDOMEN AND PELVIS WITH CONTRAST TECHNIQUE: Multidetector CT imaging of the chest was performed using the standard protocol during bolus administration of intravenous contrast. Multiplanar CT image reconstructions and MIPs were obtained to evaluate the vascular anatomy. Multidetector CT imaging of the abdomen and pelvis was performed using the standard protocol during bolus administration of intravenous contrast. RADIATION DOSE REDUCTION: This exam was performed according to the departmental  dose-optimization program which includes automated exposure control, adjustment of the mA and/or kV according to patient size and/or use of iterative reconstruction technique. CONTRAST:  23m OMNIPAQUE IOHEXOL 350 MG/ML SOLN COMPARISON:  Plain film from earlier in the same day, CT from 05/07/2021. FINDINGS: CTA CHEST FINDINGS Cardiovascular: Thoracic aorta demonstrates atherosclerotic calcifications without aneurysmal dilatation. No significant aortic opacification is identified to evaluate for dissection. The pulmonary artery shows a normal branching pattern bilaterally. No filling defect to suggest pulmonary embolism is noted. Coronary calcifications are seen. The heart is enlarged in size. Pacing device is noted in place. Mediastinum/Nodes: Thoracic inlet is within normal limits. No sizable hilar or mediastinal adenopathy is noted. The esophagus is within normal limits. Lungs/Pleura: Small effusions are noted bilaterally right greater than left. Basilar atelectasis is seen right greater than left as well. No focal confluent infiltrate is seen. No sizable parenchymal nodules are noted. Mild interstitial edema is noted as well. Musculoskeletal: Degenerative changes of the thoracic spine are noted. No acute rib abnormality is seen. Chronic appearing compression deformities of T11 and T12 are noted. These are stable from a prior exam from 05/07/2021 Review of the MIP images confirms the above findings. CT ABDOMEN and PELVIS FINDINGS Hepatobiliary: Gallbladder has been surgically removed. The liver is within normal limits. Pancreas: Stable midbody pancreatic cyst is noted measuring 1 cm. Remainder of the pancreas is within normal limits. Spleen: Normal in size without focal abnormality. Adrenals/Urinary Tract: Adrenal glands are within normal limits. Kidneys demonstrate a normal enhancement pattern bilaterally. Normal excretion is noted on delayed images. No renal calculi or obstructive changes are seen. The bladder  is well distended. Stomach/Bowel: Scattered diverticular change of the colon is noted without evidence of diverticulitis. Changes of prior appendectomy are noted. Small bowel and stomach are within normal limits. Vascular/Lymphatic: There are changes consistent with abdominal aortic aneurysm with prior stent graft therapy. No findings to suggest endoleak are identified. The overall appearance of the aneurysm is stable. The sac measures approximately 6.3 cm. Stent graft is widely patent with runoff into the iliac vessels bilaterally. Stable left iliac aneurysm is noted no sizable lymphadenopathy is noted. Reproductive: Status post hysterectomy. No adnexal masses. Other: Postsurgical changes are noted in the inguinal regions bilaterally stable from the prior exam. No free fluid is noted. No focal hernia is seen. Musculoskeletal: Degenerative changes of the lumbar spine are noted as well as multilevel fusion with posterior fixation. Right hip replacement is noted as well. Review of the MIP images confirms the above findings. IMPRESSION: CTA of the chest: No evidence of pulmonary emboli. Bilateral pleural effusions with basilar atelectasis. Chronic compression deformities of T11 and T12 stable from the prior exam. CT  of the abdomen and pelvis: Diverticulosis without diverticulitis. Abdominal aortic aneurysm with evidence of stent graft therapy without significant change in the size of the aneurysm sac. No endoleak is identified. Stable pancreatic cyst in the mid body. No further follow-up is recommended given the patient's age. Electronically Signed   By: Inez Catalina M.D.   On: 11/02/2021 20:33   CT ABDOMEN PELVIS W CONTRAST  Result Date: 11/02/2021 CLINICAL DATA:  Shortness of breath on exertion with abdominal pain and diarrhea for several days, initial encounter EXAM: CT ANGIOGRAPHY CHEST CT ABDOMEN AND PELVIS WITH CONTRAST TECHNIQUE: Multidetector CT imaging of the chest was performed using the standard protocol  during bolus administration of intravenous contrast. Multiplanar CT image reconstructions and MIPs were obtained to evaluate the vascular anatomy. Multidetector CT imaging of the abdomen and pelvis was performed using the standard protocol during bolus administration of intravenous contrast. RADIATION DOSE REDUCTION: This exam was performed according to the departmental dose-optimization program which includes automated exposure control, adjustment of the mA and/or kV according to patient size and/or use of iterative reconstruction technique. CONTRAST:  67m OMNIPAQUE IOHEXOL 350 MG/ML SOLN COMPARISON:  Plain film from earlier in the same day, CT from 05/07/2021. FINDINGS: CTA CHEST FINDINGS Cardiovascular: Thoracic aorta demonstrates atherosclerotic calcifications without aneurysmal dilatation. No significant aortic opacification is identified to evaluate for dissection. The pulmonary artery shows a normal branching pattern bilaterally. No filling defect to suggest pulmonary embolism is noted. Coronary calcifications are seen. The heart is enlarged in size. Pacing device is noted in place. Mediastinum/Nodes: Thoracic inlet is within normal limits. No sizable hilar or mediastinal adenopathy is noted. The esophagus is within normal limits. Lungs/Pleura: Small effusions are noted bilaterally right greater than left. Basilar atelectasis is seen right greater than left as well. No focal confluent infiltrate is seen. No sizable parenchymal nodules are noted. Mild interstitial edema is noted as well. Musculoskeletal: Degenerative changes of the thoracic spine are noted. No acute rib abnormality is seen. Chronic appearing compression deformities of T11 and T12 are noted. These are stable from a prior exam from 05/07/2021 Review of the MIP images confirms the above findings. CT ABDOMEN and PELVIS FINDINGS Hepatobiliary: Gallbladder has been surgically removed. The liver is within normal limits. Pancreas: Stable midbody  pancreatic cyst is noted measuring 1 cm. Remainder of the pancreas is within normal limits. Spleen: Normal in size without focal abnormality. Adrenals/Urinary Tract: Adrenal glands are within normal limits. Kidneys demonstrate a normal enhancement pattern bilaterally. Normal excretion is noted on delayed images. No renal calculi or obstructive changes are seen. The bladder is well distended. Stomach/Bowel: Scattered diverticular change of the colon is noted without evidence of diverticulitis. Changes of prior appendectomy are noted. Small bowel and stomach are within normal limits. Vascular/Lymphatic: There are changes consistent with abdominal aortic aneurysm with prior stent graft therapy. No findings to suggest endoleak are identified. The overall appearance of the aneurysm is stable. The sac measures approximately 6.3 cm. Stent graft is widely patent with runoff into the iliac vessels bilaterally. Stable left iliac aneurysm is noted no sizable lymphadenopathy is noted. Reproductive: Status post hysterectomy. No adnexal masses. Other: Postsurgical changes are noted in the inguinal regions bilaterally stable from the prior exam. No free fluid is noted. No focal hernia is seen. Musculoskeletal: Degenerative changes of the lumbar spine are noted as well as multilevel fusion with posterior fixation. Right hip replacement is noted as well. Review of the MIP images confirms the above findings. IMPRESSION: CTA of the  chest: No evidence of pulmonary emboli. Bilateral pleural effusions with basilar atelectasis. Chronic compression deformities of T11 and T12 stable from the prior exam. CT of the abdomen and pelvis: Diverticulosis without diverticulitis. Abdominal aortic aneurysm with evidence of stent graft therapy without significant change in the size of the aneurysm sac. No endoleak is identified. Stable pancreatic cyst in the mid body. No further follow-up is recommended given the patient's age. Electronically Signed    By: Inez Catalina M.D.   On: 11/02/2021 20:33   DG Chest Port 1 View  Result Date: 11/02/2021 CLINICAL DATA:  Shortness of breath EXAM: PORTABLE CHEST 1 VIEW COMPARISON:  Chest x-ray 09/18/2021 FINDINGS: Heart is enlarged, unchanged. There are atherosclerotic calcifications of the aorta. Left-sided pacemaker is unchanged in position. There is some minimal patchy opacities in both lung bases, left greater than right. No pleural effusion or pneumothorax. No acute fractures. Lumbar fusion hardware is partially visualized. IMPRESSION: 1. Bibasilar atelectasis/airspace disease, left greater than right. Pneumonia is a possibility in the left lung base. 2. Stable cardiomegaly. Electronically Signed   By: Ronney Asters M.D.   On: 11/02/2021 19:12    Orson Eva, DO  [facility]  If 7PM-7AM, please contact night-coverage [email or web address] Password [email or web address] 11/04/2021, 12:56 PM   LOS: 2 days

## 2021-11-05 DIAGNOSIS — I5043 Acute on chronic combined systolic (congestive) and diastolic (congestive) heart failure: Secondary | ICD-10-CM | POA: Diagnosis not present

## 2021-11-05 DIAGNOSIS — I482 Chronic atrial fibrillation, unspecified: Secondary | ICD-10-CM | POA: Diagnosis not present

## 2021-11-05 DIAGNOSIS — I2510 Atherosclerotic heart disease of native coronary artery without angina pectoris: Secondary | ICD-10-CM

## 2021-11-05 DIAGNOSIS — I10 Essential (primary) hypertension: Secondary | ICD-10-CM | POA: Diagnosis not present

## 2021-11-05 LAB — BASIC METABOLIC PANEL
Anion gap: 9 (ref 5–15)
BUN: 15 mg/dL (ref 8–23)
CO2: 26 mmol/L (ref 22–32)
Calcium: 8.9 mg/dL (ref 8.9–10.3)
Chloride: 98 mmol/L (ref 98–111)
Creatinine, Ser: 0.76 mg/dL (ref 0.44–1.00)
GFR, Estimated: >60 mL/min (ref >60)
Glucose, Bld: 113 mg/dL — ABNORMAL HIGH (ref 70–99)
Potassium: 3.2 mmol/L — ABNORMAL LOW (ref 3.5–5.1)
Sodium: 133 mmol/L — ABNORMAL LOW (ref 135–145)

## 2021-11-05 LAB — MAGNESIUM: Magnesium: 2 mg/dL (ref 1.7–2.4)

## 2021-11-05 MED ORDER — FUROSEMIDE 40 MG PO TABS: 40.0000 mg | ORAL_TABLET | Freq: Every day | ORAL | 1 refills | Status: DC

## 2021-11-05 MED ORDER — TORSEMIDE 20 MG PO TABS
40.0000 mg | ORAL_TABLET | Freq: Every day | ORAL | Status: DC
  Administered 2021-11-05: 40 mg via ORAL
  Filled 2021-11-05: qty 2

## 2021-11-05 MED ORDER — FUROSEMIDE 40 MG PO TABS: 20.0000 mg | ORAL_TABLET | Freq: Every day | ORAL | 1 refills | Status: DC

## 2021-11-05 MED ORDER — ENSURE ENLIVE PO LIQD: 237.0000 mL | Freq: Two times a day (BID) | ORAL | 12 refills | Status: DC

## 2021-11-05 MED ORDER — POTASSIUM CHLORIDE CRYS ER 20 MEQ PO TBCR
40.0000 meq | EXTENDED_RELEASE_TABLET | Freq: Once | ORAL | Status: AC
  Administered 2021-11-05: 40 meq via ORAL
  Filled 2021-11-05: qty 2

## 2021-11-05 NOTE — Discharge Summary (Signed)
Physician Discharge Summary   Patient: Karina [name] MRN: [id number] DOB: 26-Sep-1922  Admit date:     11/02/2021  Discharge date: 11/05/21  Discharge Physician: Shanon Brow Rayelynn Loyal   PCP: [name], MD   Recommendations at discharge:   Please follow up with primary care provider within 1-2 weeks  Please repeat BMP and CBC in one week     Hospital Course: 88 year old female with chronic atrial fibrillation, tachybradycardia syndrome status post pacemaker, hypertension,  hx AAA s/p endovascular repair, CAD sp PCI >1 yr presents with 1 week history of generalized weakness, malaise.  She states that she has been having some loose stools for the better part of the week.  She presents with lower abdominal pain of 1 day duration.   She denies any vomiting but has been having some nausea.  There is no hematochezia or melena. She denies any fevers, chills, chest pain but she has been having some shortness of breath.  She has had a nonproductive cough. The patient had a recent hospital admission from 09/18/2021 to 09/21/2021 at [facility] when she was treated for TIA.  MRI could not be done since patient has a pacemaker in place. CTA head and neck with contrast reveals the common carotid and internal carotid arteries are patent within the neck without hemodynamically significant stenosis (50% or greater).   During the hospitalization, the patient was also noted to have hyponatremia which was felt to be due to volume depletion which improved with IV fluids.  She also had episodes of sundowning and agitation which required Haldol.  In the ED, the patient was afebrile and hemodynamically stable. Sodium was noted to be 127, potassium 4.4, serum creatinine 0.72.  CTA chest was negative for dissection or PE.  There were small bilateral pleural effusions, right greater than left.  There is mild interstitial edema.  CT of the abdomen and pelvis showed a stable 1 cm pancreatic cyst.  She has a AAA with stent status post graft  therapy that was stable.  Assessment and Plan: * Abdominal pain Etiology unclear -CT abdomen and pelvis--negative for acute findings -Judicious opioids -stool pathogen panel as pt is complaining of loose stool -overall improved, tolerating diet -loose stool improved  Failure to thrive in adult PT eval>>HHPT B12--196 Folate--12.0 TSH--1.154 Supplement B12 and folate  Acute on chronic combined systolic and diastolic CHF (congestive heart failure) (HCC) Patient presented with dyspnea with bilateral pleural effusions on CTA chest and interstitial edema -Lasix IV on 7/30 and 7/31 -d/c home on lasix 40 mg>>1/2 tab daily -Hold IV fluids -05/06/2021 echo EF 40 to 45%, no WMA, grade 2 DD -Accurate I's and O's -11/04/21 echo--55-60%, no WMA, normal RV, mild AI  Coronary artery disease involving native coronary artery of native heart without angina pectoris No chest pain presently Continue Crestor  History of TIA (transient ischemic attack) Continue apixaban and Crestor No focal deficits  Chronic a-fib (HCC) Rate controlled Continue Cardizem CD and metoprolol succinate Continue apixaban  Tachy-brady syndrome ([place]) Status post PPM Follow-up Dr. [name]  Hyponatremia Suspect this is due to fluid overload Received IV lasix on 7/30 and 7/31 with clinical improvement>>>Na improved 126>>133 Discontinue NaCl tabs   Dyslipidemia Continue statin  Essential hypertension Continue diltiazem and metoprolol succinate Restart clonidine, valsartan         Consultants: none Procedures performed: none  Disposition: Home Diet recommendation:  Cardiac diet DISCHARGE MEDICATION: Allergies as of 11/05/2021       Reactions   Ace Inhibitors Hives  Amiodarone Other (See Comments)   Worsening peripheral neuropathy   Tikosyn [dofetilide] Other (See Comments)   QT prolongation   Meperidine And Related Other (See Comments)   Unknown reaction        Medication List      STOP taking these medications    cefdinir 300 MG capsule Commonly known as: OMNICEF   diltiazem 120 MG 24 hr capsule Commonly known as: TIAZAC   polyethylene glycol powder 17 GM/SCOOP powder Commonly known as: GLYCOLAX/MIRALAX   sodium chloride 1 g tablet   VITAMIN C PO       TAKE these medications    apixaban 2.5 MG Tabs tablet Commonly known as: ELIQUIS Take 1 tablet (2.5 mg total) 2 (two) times daily by mouth.   citalopram 10 MG tablet Commonly known as: CELEXA Take 5 mg by mouth every morning.   cloNIDine 0.1 MG tablet Commonly known as: CATAPRES Take 0.1 mg by mouth 3 (three) times daily.   diltiazem 120 MG 24 hr capsule Commonly known as: CARDIZEM CD Take 120 mg by mouth every morning.   feeding supplement Liqd Take 237 mLs by mouth 2 (two) times daily between meals.   furosemide 40 MG tablet Commonly known as: LASIX Take 0.5 tablets (20 mg total) by mouth daily. What changed:  how much to take when to take this reasons to take this   metoprolol succinate 25 MG 24 hr tablet Commonly known as: TOPROL-XL Take 25 mg by mouth 2 (two) times daily.   mirtazapine 15 MG disintegrating tablet Commonly known as: REMERON SOL-TAB Take 15 mg by mouth every morning.   nitroGLYCERIN 0.4 MG SL tablet Commonly known as: NITROSTAT   pantoprazole 40 MG tablet Commonly known as: PROTONIX Take 40 mg by mouth every morning.   QUEtiapine 25 MG tablet Commonly known as: SEROQUEL Take by mouth.   rosuvastatin 5 MG tablet Commonly known as: CRESTOR Take 1 tablet (5 mg total) by mouth daily. What changed: when to take this   valsartan 80 MG tablet Commonly known as: DIOVAN Take 80 mg by mouth every morning.        Discharge Exam: Filed Weights   11/02/21 1754  Weight: 52.2 kg   HEENT:  [place]/AT, No thrush, no icterus CV:  RRR, no rub, no S3, no S4 Lung:  CTA, no wheeze, no rhonchi Abd:  soft/+BS, NT Ext:  No edema, no lymphangitis, no synovitis, no  rash   Condition at discharge: stable  The results of significant diagnostics from this hospitalization (including imaging, microbiology, ancillary and laboratory) are listed below for reference.   Imaging Studies: ECHOCARDIOGRAM COMPLETE  Result Date: 11/04/2021    ECHOCARDIOGRAM REPORT   Patient Name:   Karina [name] Date of Exam: 11/04/2021 Medical Rec #:  [id number]       Height:       61.5 in Accession #:    [id number]      Weight:       115.0 lb Date of Birth:  [birth year]       BSA:          1.502 m Patient Age:    55 years        BP:           127/74 mmHg Patient Gender: F               HR:           79 bpm. Exam Location:  [facility] Procedure: 2D Echo,  Cardiac Doppler and Color Doppler Indications:    CHF  History:        Patient has prior history of Echocardiogram examinations, most                 recent 05/06/2021. CHF, CAD, Pacemaker, TIA, Arrythmias:Atrial                 Fibrillation, Signs/Symptoms:Dyspnea and Edema; Risk                 Factors:Hypertension, Diabetes and Dyslipidemia.  Sonographer:    Wenda Low Referring Phys: [phone] Marianne Golightly IMPRESSIONS  1. Left ventricular ejection fraction, by estimation, is 55 to 60%. The left ventricle has normal function. The left ventricle has no regional wall motion abnormalities. There is mild left ventricular hypertrophy. Left ventricular diastolic parameters are indeterminate.  2. Right ventricular systolic function is normal. The right ventricular size is normal. There is moderately elevated pulmonary artery systolic pressure.  3. Left atrial size was severely dilated.  4. Right atrial size was severely dilated.  5. Mild mitral valve regurgitation.  6. The aortic valve is tricuspid. Aortic valve regurgitation is mild. Aortic valve sclerosis/calcification is present, without any evidence of aortic stenosis.  7. The inferior vena cava is dilated in size with >50% respiratory variability, suggesting right atrial pressure of 8 mmHg.  Comparison(s): The left ventricular function no significant change. The left ventricular hypertrophy has improved. FINDINGS  Left Ventricle: Left ventricular ejection fraction, by estimation, is 55 to 60%. The left ventricle has normal function. The left ventricle has no regional wall motion abnormalities. The left ventricular internal cavity size was normal in size. There is  mild left ventricular hypertrophy. Left ventricular diastolic parameters are indeterminate. Right Ventricle: The right ventricular size is normal. Right vetricular wall thickness was not assessed. Right ventricular systolic function is normal. There is moderately elevated pulmonary artery systolic pressure. The tricuspid regurgitant velocity is  3.15 m/s, and with an assumed right atrial pressure of 8 mmHg, the estimated right ventricular systolic pressure is 64.4 mmHg. Left Atrium: Left atrial size was severely dilated. Right Atrium: Right atrial size was severely dilated. Pericardium: There is no evidence of pericardial effusion. Mitral Valve: There is mild thickening of the mitral valve leaflet(s). Mild mitral annular calcification. Mild mitral valve regurgitation. MV peak gradient, 4.1 mmHg. The mean mitral valve gradient is 1.0 mmHg. Tricuspid Valve: The tricuspid valve is normal in structure. Tricuspid valve regurgitation is mild. Aortic Valve: The aortic valve is tricuspid. Aortic valve regurgitation is mild. Aortic regurgitation PHT measures 335 msec. Aortic valve sclerosis/calcification is present, without any evidence of aortic stenosis. Aortic valve mean gradient measures 3.0  mmHg. Aortic valve peak gradient measures 6.4 mmHg. Aortic valve area, by VTI measures 2.06 cm. Pulmonic Valve: The pulmonic valve was grossly normal. Pulmonic valve regurgitation is not visualized. Aorta: The aortic root is normal in size and structure. Venous: The inferior vena cava is dilated in size with greater than 50% respiratory variability,  suggesting right atrial pressure of 8 mmHg. IAS/Shunts: No atrial level shunt detected by color flow Doppler. Additional Comments: A device lead is visualized.  LEFT VENTRICLE PLAX 2D LVIDd:         3.80 cm     Diastology LVIDs:         2.50 cm     LV e' lateral:   13.50 cm/s LV PW:         1.30 cm     LV  E/e' lateral: 7.3 LV IVS:        1.30 cm LVOT diam:     1.80 cm LV SV:         44 LV SV Index:   29 LVOT Area:     2.54 cm  LV Volumes (MOD) LV vol d, MOD A2C: 32.0 ml LV vol d, MOD A4C: 31.6 ml LV vol s, MOD A2C: 15.1 ml LV vol s, MOD A4C: 15.3 ml LV SV MOD A2C:     16.9 ml LV SV MOD A4C:     31.6 ml LV SV MOD BP:      17.4 ml RIGHT VENTRICLE RV Basal diam:  3.85 cm RV Mid diam:    2.20 cm RV S prime:     8.76 cm/s LEFT ATRIUM              Index        RIGHT ATRIUM           Index LA diam:        4.40 cm  2.93 cm/m   RA Area:     32.40 cm LA Vol (A2C):   100.0 ml 66.58 ml/m  RA Volume:   116.00 ml 77.23 ml/m LA Vol (A4C):   94.0 ml  62.58 ml/m LA Biplane Vol: 97.0 ml  64.58 ml/m  AORTIC VALVE                    PULMONIC VALVE AV Area (Vmax):    1.73 cm     PV Vmax:       0.42 m/s AV Area (Vmean):   1.87 cm     PV Peak grad:  0.7 mmHg AV Area (VTI):     2.06 cm AV Vmax:           126.00 cm/s AV Vmean:          74.400 cm/s AV VTI:            0.211 m AV Peak Grad:      6.4 mmHg AV Mean Grad:      3.0 mmHg LVOT Vmax:         85.50 cm/s LVOT Vmean:        54.700 cm/s LVOT VTI:          0.171 m LVOT/AV VTI ratio: 0.81 AI PHT:            335 msec  AORTA Ao Root diam: 2.80 cm Ao Asc diam:  3.40 cm MITRAL VALVE               TRICUSPID VALVE MV Area (PHT): 5.11 cm    TR Peak grad:   39.7 mmHg MV Area VTI:   2.01 cm    TR Vmax:        315.00 cm/s MV Peak grad:  4.1 mmHg MV Mean grad:  1.0 mmHg    SHUNTS MV Vmax:       1.01 m/s    Systemic VTI:  0.17 m MV Vmean:      48.5 cm/s   Systemic Diam: 1.80 cm MV Decel Time: 149 msec MV E velocity: 97.90 cm/s Dorris Carnes MD Electronically signed by Dorris Carnes MD Signature  Date/Time: 11/04/2021/4:18:26 PM    Final    CT Angio Chest PE W and/or Wo Contrast  Result Date: 11/02/2021 CLINICAL DATA:  Shortness of breath on exertion with abdominal pain and diarrhea for several days, initial encounter EXAM: CT ANGIOGRAPHY  CHEST CT ABDOMEN AND PELVIS WITH CONTRAST TECHNIQUE: Multidetector CT imaging of the chest was performed using the standard protocol during bolus administration of intravenous contrast. Multiplanar CT image reconstructions and MIPs were obtained to evaluate the vascular anatomy. Multidetector CT imaging of the abdomen and pelvis was performed using the standard protocol during bolus administration of intravenous contrast. RADIATION DOSE REDUCTION: This exam was performed according to the departmental dose-optimization program which includes automated exposure control, adjustment of the mA and/or kV according to patient size and/or use of iterative reconstruction technique. CONTRAST:  64m OMNIPAQUE IOHEXOL 350 MG/ML SOLN COMPARISON:  Plain film from earlier in the same day, CT from 05/07/2021. FINDINGS: CTA CHEST FINDINGS Cardiovascular: Thoracic aorta demonstrates atherosclerotic calcifications without aneurysmal dilatation. No significant aortic opacification is identified to evaluate for dissection. The pulmonary artery shows a normal branching pattern bilaterally. No filling defect to suggest pulmonary embolism is noted. Coronary calcifications are seen. The heart is enlarged in size. Pacing device is noted in place. Mediastinum/Nodes: Thoracic inlet is within normal limits. No sizable hilar or mediastinal adenopathy is noted. The esophagus is within normal limits. Lungs/Pleura: Small effusions are noted bilaterally right greater than left. Basilar atelectasis is seen right greater than left as well. No focal confluent infiltrate is seen. No sizable parenchymal nodules are noted. Mild interstitial edema is noted as well. Musculoskeletal: Degenerative changes of the  thoracic spine are noted. No acute rib abnormality is seen. Chronic appearing compression deformities of T11 and T12 are noted. These are stable from a prior exam from 05/07/2021 Review of the MIP images confirms the above findings. CT ABDOMEN and PELVIS FINDINGS Hepatobiliary: Gallbladder has been surgically removed. The liver is within normal limits. Pancreas: Stable midbody pancreatic cyst is noted measuring 1 cm. Remainder of the pancreas is within normal limits. Spleen: Normal in size without focal abnormality. Adrenals/Urinary Tract: Adrenal glands are within normal limits. Kidneys demonstrate a normal enhancement pattern bilaterally. Normal excretion is noted on delayed images. No renal calculi or obstructive changes are seen. The bladder is well distended. Stomach/Bowel: Scattered diverticular change of the colon is noted without evidence of diverticulitis. Changes of prior appendectomy are noted. Small bowel and stomach are within normal limits. Vascular/Lymphatic: There are changes consistent with abdominal aortic aneurysm with prior stent graft therapy. No findings to suggest endoleak are identified. The overall appearance of the aneurysm is stable. The sac measures approximately 6.3 cm. Stent graft is widely patent with runoff into the iliac vessels bilaterally. Stable left iliac aneurysm is noted no sizable lymphadenopathy is noted. Reproductive: Status post hysterectomy. No adnexal masses. Other: Postsurgical changes are noted in the inguinal regions bilaterally stable from the prior exam. No free fluid is noted. No focal hernia is seen. Musculoskeletal: Degenerative changes of the lumbar spine are noted as well as multilevel fusion with posterior fixation. Right hip replacement is noted as well. Review of the MIP images confirms the above findings. IMPRESSION: CTA of the chest: No evidence of pulmonary emboli. Bilateral pleural effusions with basilar atelectasis. Chronic compression deformities of T11  and T12 stable from the prior exam. CT of the abdomen and pelvis: Diverticulosis without diverticulitis. Abdominal aortic aneurysm with evidence of stent graft therapy without significant change in the size of the aneurysm sac. No endoleak is identified. Stable pancreatic cyst in the mid body. No further follow-up is recommended given the patient's age. Electronically Signed   By: MInez CatalinaM.D.   On: 11/02/2021 20:33   CT ABDOMEN PELVIS W CONTRAST  Result Date: 11/02/2021 CLINICAL DATA:  Shortness of breath on exertion with abdominal pain and diarrhea for several days, initial encounter EXAM: CT ANGIOGRAPHY CHEST CT ABDOMEN AND PELVIS WITH CONTRAST TECHNIQUE: Multidetector CT imaging of the chest was performed using the standard protocol during bolus administration of intravenous contrast. Multiplanar CT image reconstructions and MIPs were obtained to evaluate the vascular anatomy. Multidetector CT imaging of the abdomen and pelvis was performed using the standard protocol during bolus administration of intravenous contrast. RADIATION DOSE REDUCTION: This exam was performed according to the departmental dose-optimization program which includes automated exposure control, adjustment of the mA and/or kV according to patient size and/or use of iterative reconstruction technique. CONTRAST:  76m OMNIPAQUE IOHEXOL 350 MG/ML SOLN COMPARISON:  Plain film from earlier in the same day, CT from 05/07/2021. FINDINGS: CTA CHEST FINDINGS Cardiovascular: Thoracic aorta demonstrates atherosclerotic calcifications without aneurysmal dilatation. No significant aortic opacification is identified to evaluate for dissection. The pulmonary artery shows a normal branching pattern bilaterally. No filling defect to suggest pulmonary embolism is noted. Coronary calcifications are seen. The heart is enlarged in size. Pacing device is noted in place. Mediastinum/Nodes: Thoracic inlet is within normal limits. No sizable hilar or  mediastinal adenopathy is noted. The esophagus is within normal limits. Lungs/Pleura: Small effusions are noted bilaterally right greater than left. Basilar atelectasis is seen right greater than left as well. No focal confluent infiltrate is seen. No sizable parenchymal nodules are noted. Mild interstitial edema is noted as well. Musculoskeletal: Degenerative changes of the thoracic spine are noted. No acute rib abnormality is seen. Chronic appearing compression deformities of T11 and T12 are noted. These are stable from a prior exam from 05/07/2021 Review of the MIP images confirms the above findings. CT ABDOMEN and PELVIS FINDINGS Hepatobiliary: Gallbladder has been surgically removed. The liver is within normal limits. Pancreas: Stable midbody pancreatic cyst is noted measuring 1 cm. Remainder of the pancreas is within normal limits. Spleen: Normal in size without focal abnormality. Adrenals/Urinary Tract: Adrenal glands are within normal limits. Kidneys demonstrate a normal enhancement pattern bilaterally. Normal excretion is noted on delayed images. No renal calculi or obstructive changes are seen. The bladder is well distended. Stomach/Bowel: Scattered diverticular change of the colon is noted without evidence of diverticulitis. Changes of prior appendectomy are noted. Small bowel and stomach are within normal limits. Vascular/Lymphatic: There are changes consistent with abdominal aortic aneurysm with prior stent graft therapy. No findings to suggest endoleak are identified. The overall appearance of the aneurysm is stable. The sac measures approximately 6.3 cm. Stent graft is widely patent with runoff into the iliac vessels bilaterally. Stable left iliac aneurysm is noted no sizable lymphadenopathy is noted. Reproductive: Status post hysterectomy. No adnexal masses. Other: Postsurgical changes are noted in the inguinal regions bilaterally stable from the prior exam. No free fluid is noted. No focal hernia is  seen. Musculoskeletal: Degenerative changes of the lumbar spine are noted as well as multilevel fusion with posterior fixation. Right hip replacement is noted as well. Review of the MIP images confirms the above findings. IMPRESSION: CTA of the chest: No evidence of pulmonary emboli. Bilateral pleural effusions with basilar atelectasis. Chronic compression deformities of T11 and T12 stable from the prior exam. CT of the abdomen and pelvis: Diverticulosis without diverticulitis. Abdominal aortic aneurysm with evidence of stent graft therapy without significant change in the size of the aneurysm sac. No endoleak is identified. Stable pancreatic cyst in the mid body. No further follow-up is recommended given  the patient's age. Electronically Signed   By: Inez Catalina M.D.   On: 11/02/2021 20:33   DG Chest Port 1 View  Result Date: 11/02/2021 CLINICAL DATA:  Shortness of breath EXAM: PORTABLE CHEST 1 VIEW COMPARISON:  Chest x-ray 09/18/2021 FINDINGS: Heart is enlarged, unchanged. There are atherosclerotic calcifications of the aorta. Left-sided pacemaker is unchanged in position. There is some minimal patchy opacities in both lung bases, left greater than right. No pleural effusion or pneumothorax. No acute fractures. Lumbar fusion hardware is partially visualized. IMPRESSION: 1. Bibasilar atelectasis/airspace disease, left greater than right. Pneumonia is a possibility in the left lung base. 2. Stable cardiomegaly. Electronically Signed   By: Ronney Asters M.D.   On: 11/02/2021 19:12    Microbiology: Results for orders placed or performed during the hospital encounter of 09/09/21  Urine Culture     Status: Abnormal   Collection Time: 09/09/21  5:35 PM   Specimen: Urine, Clean Catch  Result Value Ref Range Status   Specimen Description   Final    URINE, CLEAN CATCH Performed at [facility], [address] [place], [place] [address]    Special Requests   Final    NONE Performed at [facility],  [address] [place], [place] [address]    Culture MULTIPLE SPECIES PRESENT, SUGGEST RECOLLECTION (A)  Final   Report Status 09/11/2021 FINAL  Final    Labs: CBC: Recent Labs  Lab 11/02/21 1841 11/03/21 0611  WBC 8.8 10.0  NEUTROABS 6.2  --   HGB 11.7* 12.0  HCT 35.2* 36.8  MCV 89.6 90.2  PLT 235 034   Basic Metabolic Panel: Recent Labs  Lab 11/02/21 1841 11/03/21 0148 11/03/21 0611 11/04/21 0719 11/05/21 0555  NA 127* 126* 129* 131* 133*  K 4.4 4.2 4.3 4.0 3.2*  CL 95* 97* 98 97* 98  CO2 '25 22 26 29 26  '$ GLUCOSE 117* 139* 118* 120* 113*  BUN '11 10 9 15 15  '$ CREATININE 0.72 0.68 0.71 0.81 0.76  CALCIUM 8.8* 8.3* 8.5* 9.0 8.9  MG  --   --   --  1.8 2.0   Liver Function Tests: Recent Labs  Lab 11/02/21 1841  AST 28  ALT 23  ALKPHOS 88  BILITOT 1.0  PROT 6.1*  ALBUMIN 3.5   CBG: No results for input(s): "GLUCAP" in the last 168 hours.  Discharge time spent: greater than 30 minutes.  Signed: Orson Eva, MD [facility] 11/05/2021

## 2021-11-05 NOTE — Progress Notes (Signed)
Patient stable and ready for discharge home. VSS, IV removed without issues. Patient's family member at bedside for discharge paperwork and went over with Nicole Kindred and patient and both verbalized understanding and had no questions at this time. NT dressed patient. Patient transferred to family car via [facility] for home.

## 2021-11-05 NOTE — Progress Notes (Signed)
Patient has rested well with no issues or c/o of pain.

## 2021-11-11 DIAGNOSIS — I509 Heart failure, unspecified: Secondary | ICD-10-CM | POA: Diagnosis not present

## 2021-11-11 DIAGNOSIS — E871 Hypo-osmolality and hyponatremia: Secondary | ICD-10-CM | POA: Diagnosis not present

## 2021-11-11 DIAGNOSIS — N179 Acute kidney failure, unspecified: Secondary | ICD-10-CM | POA: Diagnosis not present

## 2021-11-11 DIAGNOSIS — Z299 Encounter for prophylactic measures, unspecified: Secondary | ICD-10-CM | POA: Diagnosis not present

## 2021-11-11 DIAGNOSIS — I130 Hypertensive heart and chronic kidney disease with heart failure and stage 1 through stage 4 chronic kidney disease, or unspecified chronic kidney disease: Secondary | ICD-10-CM | POA: Diagnosis not present

## 2021-11-11 DIAGNOSIS — I10 Essential (primary) hypertension: Secondary | ICD-10-CM | POA: Diagnosis not present

## 2021-11-11 DIAGNOSIS — I4891 Unspecified atrial fibrillation: Secondary | ICD-10-CM | POA: Diagnosis not present

## 2021-11-11 DIAGNOSIS — N183 Chronic kidney disease, stage 3 unspecified: Secondary | ICD-10-CM | POA: Diagnosis not present

## 2021-11-11 DIAGNOSIS — R413 Other amnesia: Secondary | ICD-10-CM | POA: Diagnosis not present

## 2021-11-11 DIAGNOSIS — Z8673 Personal history of transient ischemic attack (TIA), and cerebral infarction without residual deficits: Secondary | ICD-10-CM | POA: Diagnosis not present

## 2021-11-11 DIAGNOSIS — I480 Paroxysmal atrial fibrillation: Secondary | ICD-10-CM | POA: Diagnosis not present

## 2021-11-11 DIAGNOSIS — Z7901 Long term (current) use of anticoagulants: Secondary | ICD-10-CM | POA: Diagnosis not present

## 2021-11-11 DIAGNOSIS — F32A Depression, unspecified: Secondary | ICD-10-CM | POA: Diagnosis not present

## 2021-11-11 DIAGNOSIS — K219 Gastro-esophageal reflux disease without esophagitis: Secondary | ICD-10-CM | POA: Diagnosis not present

## 2021-11-11 DIAGNOSIS — E7800 Pure hypercholesterolemia, unspecified: Secondary | ICD-10-CM | POA: Diagnosis not present

## 2021-11-11 DIAGNOSIS — Z6823 Body mass index (BMI) 23.0-23.9, adult: Secondary | ICD-10-CM | POA: Diagnosis not present

## 2021-11-11 DIAGNOSIS — Z7982 Long term (current) use of aspirin: Secondary | ICD-10-CM | POA: Diagnosis not present

## 2021-11-11 DIAGNOSIS — Z713 Dietary counseling and surveillance: Secondary | ICD-10-CM | POA: Diagnosis not present

## 2021-11-14 DIAGNOSIS — E7800 Pure hypercholesterolemia, unspecified: Secondary | ICD-10-CM | POA: Diagnosis not present

## 2021-11-14 DIAGNOSIS — N183 Chronic kidney disease, stage 3 unspecified: Secondary | ICD-10-CM | POA: Diagnosis not present

## 2021-11-14 DIAGNOSIS — I509 Heart failure, unspecified: Secondary | ICD-10-CM | POA: Diagnosis not present

## 2021-11-14 DIAGNOSIS — R413 Other amnesia: Secondary | ICD-10-CM | POA: Diagnosis not present

## 2021-11-14 DIAGNOSIS — F32A Depression, unspecified: Secondary | ICD-10-CM | POA: Diagnosis not present

## 2021-11-14 DIAGNOSIS — E871 Hypo-osmolality and hyponatremia: Secondary | ICD-10-CM | POA: Diagnosis not present

## 2021-11-14 DIAGNOSIS — Z7901 Long term (current) use of anticoagulants: Secondary | ICD-10-CM | POA: Diagnosis not present

## 2021-11-14 DIAGNOSIS — I130 Hypertensive heart and chronic kidney disease with heart failure and stage 1 through stage 4 chronic kidney disease, or unspecified chronic kidney disease: Secondary | ICD-10-CM | POA: Diagnosis not present

## 2021-11-14 DIAGNOSIS — Z8673 Personal history of transient ischemic attack (TIA), and cerebral infarction without residual deficits: Secondary | ICD-10-CM | POA: Diagnosis not present

## 2021-11-14 DIAGNOSIS — K219 Gastro-esophageal reflux disease without esophagitis: Secondary | ICD-10-CM | POA: Diagnosis not present

## 2021-11-14 DIAGNOSIS — N179 Acute kidney failure, unspecified: Secondary | ICD-10-CM | POA: Diagnosis not present

## 2021-11-14 DIAGNOSIS — Z7982 Long term (current) use of aspirin: Secondary | ICD-10-CM | POA: Diagnosis not present

## 2021-11-14 DIAGNOSIS — I480 Paroxysmal atrial fibrillation: Secondary | ICD-10-CM | POA: Diagnosis not present

## 2021-11-19 DIAGNOSIS — H16223 Keratoconjunctivitis sicca, not specified as Sjogren's, bilateral: Secondary | ICD-10-CM | POA: Diagnosis not present

## 2021-11-20 DIAGNOSIS — E7800 Pure hypercholesterolemia, unspecified: Secondary | ICD-10-CM | POA: Diagnosis not present

## 2021-11-20 DIAGNOSIS — I480 Paroxysmal atrial fibrillation: Secondary | ICD-10-CM | POA: Diagnosis not present

## 2021-11-20 DIAGNOSIS — K219 Gastro-esophageal reflux disease without esophagitis: Secondary | ICD-10-CM | POA: Diagnosis not present

## 2021-11-20 DIAGNOSIS — Z8673 Personal history of transient ischemic attack (TIA), and cerebral infarction without residual deficits: Secondary | ICD-10-CM | POA: Diagnosis not present

## 2021-11-20 DIAGNOSIS — I130 Hypertensive heart and chronic kidney disease with heart failure and stage 1 through stage 4 chronic kidney disease, or unspecified chronic kidney disease: Secondary | ICD-10-CM | POA: Diagnosis not present

## 2021-11-20 DIAGNOSIS — F32A Depression, unspecified: Secondary | ICD-10-CM | POA: Diagnosis not present

## 2021-11-20 DIAGNOSIS — R413 Other amnesia: Secondary | ICD-10-CM | POA: Diagnosis not present

## 2021-11-20 DIAGNOSIS — E871 Hypo-osmolality and hyponatremia: Secondary | ICD-10-CM | POA: Diagnosis not present

## 2021-11-20 DIAGNOSIS — I509 Heart failure, unspecified: Secondary | ICD-10-CM | POA: Diagnosis not present

## 2021-11-20 DIAGNOSIS — N183 Chronic kidney disease, stage 3 unspecified: Secondary | ICD-10-CM | POA: Diagnosis not present

## 2021-11-20 DIAGNOSIS — N179 Acute kidney failure, unspecified: Secondary | ICD-10-CM | POA: Diagnosis not present

## 2021-11-20 DIAGNOSIS — Z7982 Long term (current) use of aspirin: Secondary | ICD-10-CM | POA: Diagnosis not present

## 2021-11-20 DIAGNOSIS — Z7901 Long term (current) use of anticoagulants: Secondary | ICD-10-CM | POA: Diagnosis not present

## 2021-11-22 DIAGNOSIS — E871 Hypo-osmolality and hyponatremia: Secondary | ICD-10-CM | POA: Diagnosis not present

## 2021-11-22 DIAGNOSIS — Z7901 Long term (current) use of anticoagulants: Secondary | ICD-10-CM | POA: Diagnosis not present

## 2021-11-22 DIAGNOSIS — N183 Chronic kidney disease, stage 3 unspecified: Secondary | ICD-10-CM | POA: Diagnosis not present

## 2021-11-22 DIAGNOSIS — E7800 Pure hypercholesterolemia, unspecified: Secondary | ICD-10-CM | POA: Diagnosis not present

## 2021-11-22 DIAGNOSIS — Z8673 Personal history of transient ischemic attack (TIA), and cerebral infarction without residual deficits: Secondary | ICD-10-CM | POA: Diagnosis not present

## 2021-11-22 DIAGNOSIS — I480 Paroxysmal atrial fibrillation: Secondary | ICD-10-CM | POA: Diagnosis not present

## 2021-11-22 DIAGNOSIS — Z7982 Long term (current) use of aspirin: Secondary | ICD-10-CM | POA: Diagnosis not present

## 2021-11-22 DIAGNOSIS — R413 Other amnesia: Secondary | ICD-10-CM | POA: Diagnosis not present

## 2021-11-22 DIAGNOSIS — I130 Hypertensive heart and chronic kidney disease with heart failure and stage 1 through stage 4 chronic kidney disease, or unspecified chronic kidney disease: Secondary | ICD-10-CM | POA: Diagnosis not present

## 2021-11-22 DIAGNOSIS — K219 Gastro-esophageal reflux disease without esophagitis: Secondary | ICD-10-CM | POA: Diagnosis not present

## 2021-11-22 DIAGNOSIS — F32A Depression, unspecified: Secondary | ICD-10-CM | POA: Diagnosis not present

## 2021-11-22 DIAGNOSIS — N179 Acute kidney failure, unspecified: Secondary | ICD-10-CM | POA: Diagnosis not present

## 2021-11-22 DIAGNOSIS — I509 Heart failure, unspecified: Secondary | ICD-10-CM | POA: Diagnosis not present

## 2021-11-27 DIAGNOSIS — I480 Paroxysmal atrial fibrillation: Secondary | ICD-10-CM | POA: Diagnosis not present

## 2021-11-27 DIAGNOSIS — I69398 Other sequelae of cerebral infarction: Secondary | ICD-10-CM | POA: Diagnosis not present

## 2021-11-27 DIAGNOSIS — N183 Chronic kidney disease, stage 3 unspecified: Secondary | ICD-10-CM | POA: Diagnosis not present

## 2021-11-27 DIAGNOSIS — E7800 Pure hypercholesterolemia, unspecified: Secondary | ICD-10-CM | POA: Diagnosis not present

## 2021-11-27 DIAGNOSIS — F32A Depression, unspecified: Secondary | ICD-10-CM | POA: Diagnosis not present

## 2021-11-27 DIAGNOSIS — E871 Hypo-osmolality and hyponatremia: Secondary | ICD-10-CM | POA: Diagnosis not present

## 2021-11-27 DIAGNOSIS — I130 Hypertensive heart and chronic kidney disease with heart failure and stage 1 through stage 4 chronic kidney disease, or unspecified chronic kidney disease: Secondary | ICD-10-CM | POA: Diagnosis not present

## 2021-11-27 DIAGNOSIS — I509 Heart failure, unspecified: Secondary | ICD-10-CM | POA: Diagnosis not present

## 2021-11-27 DIAGNOSIS — Z7901 Long term (current) use of anticoagulants: Secondary | ICD-10-CM | POA: Diagnosis not present

## 2021-11-27 DIAGNOSIS — Z7409 Other reduced mobility: Secondary | ICD-10-CM | POA: Diagnosis not present

## 2021-11-27 DIAGNOSIS — K219 Gastro-esophageal reflux disease without esophagitis: Secondary | ICD-10-CM | POA: Diagnosis not present

## 2021-11-28 DIAGNOSIS — I130 Hypertensive heart and chronic kidney disease with heart failure and stage 1 through stage 4 chronic kidney disease, or unspecified chronic kidney disease: Secondary | ICD-10-CM | POA: Diagnosis not present

## 2021-11-28 DIAGNOSIS — K219 Gastro-esophageal reflux disease without esophagitis: Secondary | ICD-10-CM | POA: Diagnosis not present

## 2021-11-28 DIAGNOSIS — I480 Paroxysmal atrial fibrillation: Secondary | ICD-10-CM | POA: Diagnosis not present

## 2021-11-28 DIAGNOSIS — N183 Chronic kidney disease, stage 3 unspecified: Secondary | ICD-10-CM | POA: Diagnosis not present

## 2021-11-28 DIAGNOSIS — E871 Hypo-osmolality and hyponatremia: Secondary | ICD-10-CM | POA: Diagnosis not present

## 2021-11-28 DIAGNOSIS — Z7409 Other reduced mobility: Secondary | ICD-10-CM | POA: Diagnosis not present

## 2021-11-28 DIAGNOSIS — I69398 Other sequelae of cerebral infarction: Secondary | ICD-10-CM | POA: Diagnosis not present

## 2021-11-28 DIAGNOSIS — F32A Depression, unspecified: Secondary | ICD-10-CM | POA: Diagnosis not present

## 2021-11-28 DIAGNOSIS — Z7901 Long term (current) use of anticoagulants: Secondary | ICD-10-CM | POA: Diagnosis not present

## 2021-11-28 DIAGNOSIS — E7800 Pure hypercholesterolemia, unspecified: Secondary | ICD-10-CM | POA: Diagnosis not present

## 2021-11-28 DIAGNOSIS — I509 Heart failure, unspecified: Secondary | ICD-10-CM | POA: Diagnosis not present

## 2021-12-04 DIAGNOSIS — I130 Hypertensive heart and chronic kidney disease with heart failure and stage 1 through stage 4 chronic kidney disease, or unspecified chronic kidney disease: Secondary | ICD-10-CM | POA: Diagnosis not present

## 2021-12-04 DIAGNOSIS — N183 Chronic kidney disease, stage 3 unspecified: Secondary | ICD-10-CM | POA: Diagnosis not present

## 2021-12-04 DIAGNOSIS — I69398 Other sequelae of cerebral infarction: Secondary | ICD-10-CM | POA: Diagnosis not present

## 2021-12-04 DIAGNOSIS — I10 Essential (primary) hypertension: Secondary | ICD-10-CM | POA: Diagnosis not present

## 2021-12-04 DIAGNOSIS — R197 Diarrhea, unspecified: Secondary | ICD-10-CM | POA: Diagnosis not present

## 2021-12-04 DIAGNOSIS — Z7409 Other reduced mobility: Secondary | ICD-10-CM | POA: Diagnosis not present

## 2021-12-04 DIAGNOSIS — Z299 Encounter for prophylactic measures, unspecified: Secondary | ICD-10-CM | POA: Diagnosis not present

## 2021-12-04 DIAGNOSIS — I700 Atherosclerosis of aorta: Secondary | ICD-10-CM | POA: Diagnosis not present

## 2021-12-04 DIAGNOSIS — Z6823 Body mass index (BMI) 23.0-23.9, adult: Secondary | ICD-10-CM | POA: Diagnosis not present

## 2021-12-04 DIAGNOSIS — R509 Fever, unspecified: Secondary | ICD-10-CM | POA: Diagnosis not present

## 2021-12-05 DIAGNOSIS — Z7409 Other reduced mobility: Secondary | ICD-10-CM | POA: Diagnosis not present

## 2021-12-05 DIAGNOSIS — I69398 Other sequelae of cerebral infarction: Secondary | ICD-10-CM | POA: Diagnosis not present

## 2021-12-05 DIAGNOSIS — F32A Depression, unspecified: Secondary | ICD-10-CM | POA: Diagnosis not present

## 2021-12-05 DIAGNOSIS — N183 Chronic kidney disease, stage 3 unspecified: Secondary | ICD-10-CM | POA: Diagnosis not present

## 2021-12-05 DIAGNOSIS — I509 Heart failure, unspecified: Secondary | ICD-10-CM | POA: Diagnosis not present

## 2021-12-05 DIAGNOSIS — I130 Hypertensive heart and chronic kidney disease with heart failure and stage 1 through stage 4 chronic kidney disease, or unspecified chronic kidney disease: Secondary | ICD-10-CM | POA: Diagnosis not present

## 2021-12-05 DIAGNOSIS — K219 Gastro-esophageal reflux disease without esophagitis: Secondary | ICD-10-CM | POA: Diagnosis not present

## 2021-12-05 DIAGNOSIS — E871 Hypo-osmolality and hyponatremia: Secondary | ICD-10-CM | POA: Diagnosis not present

## 2021-12-05 DIAGNOSIS — I480 Paroxysmal atrial fibrillation: Secondary | ICD-10-CM | POA: Diagnosis not present

## 2021-12-05 DIAGNOSIS — E7800 Pure hypercholesterolemia, unspecified: Secondary | ICD-10-CM | POA: Diagnosis not present

## 2021-12-05 DIAGNOSIS — Z7901 Long term (current) use of anticoagulants: Secondary | ICD-10-CM | POA: Diagnosis not present

## 2021-12-13 DIAGNOSIS — E7800 Pure hypercholesterolemia, unspecified: Secondary | ICD-10-CM | POA: Diagnosis not present

## 2021-12-13 DIAGNOSIS — I480 Paroxysmal atrial fibrillation: Secondary | ICD-10-CM | POA: Diagnosis not present

## 2021-12-13 DIAGNOSIS — Z515 Encounter for palliative care: Secondary | ICD-10-CM | POA: Diagnosis not present

## 2021-12-13 DIAGNOSIS — K219 Gastro-esophageal reflux disease without esophagitis: Secondary | ICD-10-CM | POA: Diagnosis not present

## 2021-12-13 DIAGNOSIS — F32A Depression, unspecified: Secondary | ICD-10-CM | POA: Diagnosis not present

## 2021-12-13 DIAGNOSIS — I130 Hypertensive heart and chronic kidney disease with heart failure and stage 1 through stage 4 chronic kidney disease, or unspecified chronic kidney disease: Secondary | ICD-10-CM | POA: Diagnosis not present

## 2021-12-13 DIAGNOSIS — Z7901 Long term (current) use of anticoagulants: Secondary | ICD-10-CM | POA: Diagnosis not present

## 2021-12-13 DIAGNOSIS — I10 Essential (primary) hypertension: Secondary | ICD-10-CM | POA: Diagnosis not present

## 2021-12-13 DIAGNOSIS — N183 Chronic kidney disease, stage 3 unspecified: Secondary | ICD-10-CM | POA: Diagnosis not present

## 2021-12-13 DIAGNOSIS — Z299 Encounter for prophylactic measures, unspecified: Secondary | ICD-10-CM | POA: Diagnosis not present

## 2021-12-13 DIAGNOSIS — E871 Hypo-osmolality and hyponatremia: Secondary | ICD-10-CM | POA: Diagnosis not present

## 2021-12-13 DIAGNOSIS — I509 Heart failure, unspecified: Secondary | ICD-10-CM | POA: Diagnosis not present

## 2021-12-13 DIAGNOSIS — Z7409 Other reduced mobility: Secondary | ICD-10-CM | POA: Diagnosis not present

## 2021-12-13 DIAGNOSIS — I69398 Other sequelae of cerebral infarction: Secondary | ICD-10-CM | POA: Diagnosis not present

## 2021-12-13 DIAGNOSIS — I639 Cerebral infarction, unspecified: Secondary | ICD-10-CM | POA: Diagnosis not present

## 2021-12-18 DIAGNOSIS — Z7409 Other reduced mobility: Secondary | ICD-10-CM | POA: Diagnosis not present

## 2021-12-18 DIAGNOSIS — N183 Chronic kidney disease, stage 3 unspecified: Secondary | ICD-10-CM | POA: Diagnosis not present

## 2021-12-18 DIAGNOSIS — I69398 Other sequelae of cerebral infarction: Secondary | ICD-10-CM | POA: Diagnosis not present

## 2021-12-18 DIAGNOSIS — K219 Gastro-esophageal reflux disease without esophagitis: Secondary | ICD-10-CM | POA: Diagnosis not present

## 2021-12-18 DIAGNOSIS — E871 Hypo-osmolality and hyponatremia: Secondary | ICD-10-CM | POA: Diagnosis not present

## 2021-12-18 DIAGNOSIS — I509 Heart failure, unspecified: Secondary | ICD-10-CM | POA: Diagnosis not present

## 2021-12-18 DIAGNOSIS — I480 Paroxysmal atrial fibrillation: Secondary | ICD-10-CM | POA: Diagnosis not present

## 2021-12-18 DIAGNOSIS — E7800 Pure hypercholesterolemia, unspecified: Secondary | ICD-10-CM | POA: Diagnosis not present

## 2021-12-18 DIAGNOSIS — Z7901 Long term (current) use of anticoagulants: Secondary | ICD-10-CM | POA: Diagnosis not present

## 2021-12-18 DIAGNOSIS — I130 Hypertensive heart and chronic kidney disease with heart failure and stage 1 through stage 4 chronic kidney disease, or unspecified chronic kidney disease: Secondary | ICD-10-CM | POA: Diagnosis not present

## 2021-12-18 DIAGNOSIS — F32A Depression, unspecified: Secondary | ICD-10-CM | POA: Diagnosis not present

## 2021-12-23 DIAGNOSIS — R040 Epistaxis: Secondary | ICD-10-CM | POA: Diagnosis not present

## 2021-12-23 DIAGNOSIS — M7710 Lateral epicondylitis, unspecified elbow: Secondary | ICD-10-CM | POA: Diagnosis not present

## 2022-01-01 DIAGNOSIS — J069 Acute upper respiratory infection, unspecified: Secondary | ICD-10-CM | POA: Diagnosis not present

## 2022-01-01 DIAGNOSIS — I509 Heart failure, unspecified: Secondary | ICD-10-CM | POA: Diagnosis not present

## 2022-01-01 DIAGNOSIS — I10 Essential (primary) hypertension: Secondary | ICD-10-CM | POA: Diagnosis not present

## 2022-01-01 DIAGNOSIS — Z299 Encounter for prophylactic measures, unspecified: Secondary | ICD-10-CM | POA: Diagnosis not present

## 2022-01-06 DIAGNOSIS — E538 Deficiency of other specified B group vitamins: Secondary | ICD-10-CM | POA: Diagnosis not present

## 2022-01-06 DIAGNOSIS — Z79899 Other long term (current) drug therapy: Secondary | ICD-10-CM | POA: Diagnosis not present

## 2022-01-06 DIAGNOSIS — R531 Weakness: Secondary | ICD-10-CM | POA: Diagnosis not present

## 2022-01-06 DIAGNOSIS — Z299 Encounter for prophylactic measures, unspecified: Secondary | ICD-10-CM | POA: Diagnosis not present

## 2022-01-06 DIAGNOSIS — I10 Essential (primary) hypertension: Secondary | ICD-10-CM | POA: Diagnosis not present

## 2022-01-06 DIAGNOSIS — R5383 Other fatigue: Secondary | ICD-10-CM | POA: Diagnosis not present

## 2022-01-11 ENCOUNTER — Emergency Department (HOSPITAL_COMMUNITY): Payer: Medicare Other

## 2022-01-11 ENCOUNTER — Encounter (HOSPITAL_COMMUNITY): Payer: Self-pay | Admitting: Emergency Medicine

## 2022-01-11 ENCOUNTER — Other Ambulatory Visit: Payer: Self-pay

## 2022-01-11 ENCOUNTER — Emergency Department (HOSPITAL_COMMUNITY)
Admission: EM | Admit: 2022-01-11 | Discharge: 2022-01-11 | Disposition: A | Discharge status: Home / Self Care | Payer: Medicare Other | Attending: Emergency Medicine | Admitting: Emergency Medicine

## 2022-01-11 DIAGNOSIS — I4891 Unspecified atrial fibrillation: Secondary | ICD-10-CM | POA: Insufficient documentation

## 2022-01-11 DIAGNOSIS — Z79899 Other long term (current) drug therapy: Secondary | ICD-10-CM | POA: Insufficient documentation

## 2022-01-11 DIAGNOSIS — Z8673 Personal history of transient ischemic attack (TIA), and cerebral infarction without residual deficits: Secondary | ICD-10-CM | POA: Insufficient documentation

## 2022-01-11 DIAGNOSIS — Z950 Presence of cardiac pacemaker: Secondary | ICD-10-CM | POA: Insufficient documentation

## 2022-01-11 DIAGNOSIS — R4182 Altered mental status, unspecified: Secondary | ICD-10-CM | POA: Insufficient documentation

## 2022-01-11 DIAGNOSIS — R200 Anesthesia of skin: Secondary | ICD-10-CM | POA: Diagnosis not present

## 2022-01-11 DIAGNOSIS — U071 COVID-19: Secondary | ICD-10-CM | POA: Diagnosis not present

## 2022-01-11 DIAGNOSIS — Z7901 Long term (current) use of anticoagulants: Secondary | ICD-10-CM | POA: Diagnosis not present

## 2022-01-11 DIAGNOSIS — I10 Essential (primary) hypertension: Secondary | ICD-10-CM | POA: Diagnosis not present

## 2022-01-11 DIAGNOSIS — D72829 Elevated white blood cell count, unspecified: Secondary | ICD-10-CM | POA: Diagnosis not present

## 2022-01-11 DIAGNOSIS — R531 Weakness: Secondary | ICD-10-CM | POA: Diagnosis not present

## 2022-01-11 LAB — APTT: aPTT: 27 seconds (ref 24–36)

## 2022-01-11 LAB — RAPID URINE DRUG SCREEN, HOSP PERFORMED
Amphetamines: NOT DETECTED
Barbiturates: NOT DETECTED
Benzodiazepines: NOT DETECTED
Cocaine: NOT DETECTED
Opiates: NOT DETECTED
Tetrahydrocannabinol: NOT DETECTED

## 2022-01-11 LAB — COMPREHENSIVE METABOLIC PANEL
ALT: 20 U/L (ref 0–44)
AST: 23 U/L (ref 15–41)
Albumin: 3.7 g/dL (ref 3.5–5.0)
Alkaline Phosphatase: 91 U/L (ref 38–126)
Anion gap: 10 (ref 5–15)
BUN: 21 mg/dL (ref 8–23)
CO2: 29 mmol/L (ref 22–32)
Calcium: 9.4 mg/dL (ref 8.9–10.3)
Chloride: 97 mmol/L — ABNORMAL LOW (ref 98–111)
Creatinine, Ser: 0.89 mg/dL (ref 0.44–1.00)
GFR, Estimated: 59 mL/min — ABNORMAL LOW (ref >60)
Glucose, Bld: 99 mg/dL (ref 70–99)
Potassium: 3.7 mmol/L (ref 3.5–5.1)
Sodium: 136 mmol/L (ref 135–145)
Total Bilirubin: 0.7 mg/dL (ref 0.3–1.2)
Total Protein: 6.9 g/dL (ref 6.5–8.1)

## 2022-01-11 LAB — CBC
HCT: 41.9 % (ref 36.0–46.0)
Hemoglobin: 13.6 g/dL (ref 12.0–15.0)
MCH: 29.7 pg (ref 26.0–34.0)
MCHC: 32.5 g/dL (ref 30.0–36.0)
MCV: 91.5 fL (ref 80.0–100.0)
Platelets: 335 10*3/uL (ref 150–400)
RBC: 4.58 MIL/uL (ref 3.87–5.11)
RDW: 13.3 % (ref 11.5–15.5)
WBC: 14.4 10*3/uL — ABNORMAL HIGH (ref 4.0–10.5)
nRBC: 0 % (ref 0.0–0.2)

## 2022-01-11 LAB — I-STAT CHEM 8, ED
BUN: 19 mg/dL (ref 8–23)
Calcium, Ion: 1.18 mmol/L (ref 1.15–1.40)
Chloride: 98 mmol/L (ref 98–111)
Creatinine, Ser: 1 mg/dL (ref 0.44–1.00)
Glucose, Bld: 95 mg/dL (ref 70–99)
HCT: 44 % (ref 36.0–46.0)
Hemoglobin: 15 g/dL (ref 12.0–15.0)
Potassium: 3.8 mmol/L (ref 3.5–5.1)
Sodium: 134 mmol/L — ABNORMAL LOW (ref 135–145)
TCO2: 27 mmol/L (ref 22–32)

## 2022-01-11 LAB — DIFFERENTIAL
Abs Immature Granulocytes: 0.36 10*3/uL — ABNORMAL HIGH (ref 0.00–0.07)
Basophils Absolute: 0.1 10*3/uL (ref 0.0–0.1)
Basophils Relative: 1 %
Eosinophils Absolute: 0.3 10*3/uL (ref 0.0–0.5)
Eosinophils Relative: 2 %
Immature Granulocytes: 3 %
Lymphocytes Relative: 12 %
Lymphs Abs: 1.7 10*3/uL (ref 0.7–4.0)
Monocytes Absolute: 1.7 10*3/uL — ABNORMAL HIGH (ref 0.1–1.0)
Monocytes Relative: 12 %
Neutro Abs: 10.2 10*3/uL — ABNORMAL HIGH (ref 1.7–7.7)
Neutrophils Relative %: 70 %

## 2022-01-11 LAB — URINALYSIS, ROUTINE W REFLEX MICROSCOPIC
Bilirubin Urine: NEGATIVE
Glucose, UA: NEGATIVE mg/dL
Hgb urine dipstick: NEGATIVE
Ketones, ur: NEGATIVE mg/dL
Leukocytes,Ua: NEGATIVE
Nitrite: NEGATIVE
Protein, ur: NEGATIVE mg/dL
Specific Gravity, Urine: 1.005 (ref 1.005–1.030)
pH: 7 (ref 5.0–8.0)

## 2022-01-11 LAB — ETHANOL: Alcohol, Ethyl (B): <10 mg/dL (ref <10)

## 2022-01-11 LAB — PROTIME-INR
INR: 1.1 (ref 0.8–1.2)
Prothrombin Time: 14 seconds (ref 11.4–15.2)

## 2022-01-11 LAB — BRAIN NATRIURETIC PEPTIDE: B Natriuretic Peptide: 314 pg/mL — ABNORMAL HIGH (ref 0.0–100.0)

## 2022-01-11 LAB — SARS CORONAVIRUS 2 BY RT PCR: SARS Coronavirus 2 by RT PCR: POSITIVE — AB

## 2022-01-11 MED ORDER — SODIUM CHLORIDE 0.9 % IV SOLN
100.0000 mL/h | INTRAVENOUS | Status: DC
  Administered 2022-01-11: 100 mL/h via INTRAVENOUS

## 2022-01-11 MED ORDER — SODIUM CHLORIDE 0.9 % IV BOLUS
500.0000 mL | Freq: Once | INTRAVENOUS | Status: AC
  Administered 2022-01-11: 500 mL via INTRAVENOUS

## 2022-01-11 NOTE — ED Provider Notes (Signed)
[facility] EMERGENCY DEPARTMENT Provider Note   CSN: [id number] Arrival date & time: 01/11/22  1519     History  Chief Complaint  Patient presents with   Numbness    Karina [name] is a 88 y.o. female.  HPI Elderly female arrives via EMS with concern for right facial numbness and weakness.  Facial numbness is new over the past few days, weakness has been present for about 1 month.  There is no focality of the weakness, and she notes that although she can still feel her face normally, seems to be numb on the right side.  No speech difficulty, vision difficulty.  No recent change in medication, diet, activity.  The patient's birthday was yesterday. She notes that she has been seen and evaluated by clinicians for her ongoing weakness without clear cause elucidated.  She notes that she did stop her antihypertensives about 1 year ago as her blood pressure improved.    Home Medications Prior to Admission medications   Medication Sig Start Date End Date Taking? Authorizing Provider  apixaban (ELIQUIS) 2.5 MG TABS tablet Take 1 tablet (2.5 mg total) 2 (two) times daily by mouth. 02/23/17  Yes Eloise Levels, MD  citalopram (CELEXA) 10 MG tablet Take 5 mg by mouth every morning. 04/17/21  Yes [provider]  cloNIDine (CATAPRES) 0.1 MG tablet Take 0.1 mg by mouth 3 (three) times daily. 05/07/09  Yes [provider]  diltiazem (TIAZAC) 120 MG 24 hr capsule Take 120 mg by mouth daily. 12/12/21  Yes [provider]  feeding supplement (ENSURE ENLIVE / ENSURE PLUS) LIQD Take 237 mLs by mouth 2 (two) times daily between meals. 11/05/21  Yes Tat, Shanon Brow, MD  furosemide (LASIX) 40 MG tablet Take 0.5 tablets (20 mg total) by mouth daily. 11/05/21  Yes Tat, Shanon Brow, MD  metoprolol succinate (TOPROL-XL) 25 MG 24 hr tablet Take 25 mg by mouth 2 (two) times daily. 04/17/21  Yes [provider]  mirtazapine (REMERON SOL-TAB) 15 MG disintegrating tablet Take 15 mg by mouth every  morning.   Yes [provider]  nitroGLYCERIN (NITROSTAT) 0.4 MG SL tablet    Yes [provider]  rosuvastatin (CRESTOR) 5 MG tablet Take 1 tablet (5 mg total) by mouth daily. Patient taking differently: Take 5 mg by mouth every morning. 12/21/18  Yes Emokpae, Courage, MD  valsartan (DIOVAN) 80 MG tablet Take 80 mg by mouth every morning.   Yes [provider]  azithromycin (ZITHROMAX) 250 MG tablet Take by mouth. Patient not taking: Reported on 01/11/2022 01/01/22   [provider]  benzonatate (TESSALON) 200 MG capsule Take 200 mg by mouth daily as needed. Patient not taking: Reported on 01/11/2022 12/04/21   [provider]  ondansetron (ZOFRAN-ODT) 4 MG disintegrating tablet Take 4 mg by mouth 3 (three) times daily as needed. Patient not taking: Reported on 01/11/2022 09/16/21   [provider]  pantoprazole (PROTONIX) 40 MG tablet Take 40 mg by mouth every morning. Patient not taking: Reported on 01/11/2022    [provider]  QUEtiapine (SEROQUEL) 25 MG tablet Take by mouth. 09/21/21   [provider]      Allergies    Ace inhibitors, Amiodarone, Tikosyn [dofetilide], and Meperidine and related    Review of Systems   Review of Systems  All other systems reviewed and are negative.   Physical Exam Updated Vital Signs BP (!) 130/92   Pulse 78   Resp 13   Ht 5' 1.5" (  1.562 m)   Wt 52.2 kg   SpO2 95%   BMI 21.39 kg/m  Physical Exam Vitals and nursing note reviewed.  Constitutional:      General: She is not in acute distress.    Appearance: She is well-developed.  HENT:     Head: Normocephalic and atraumatic.  Eyes:     Conjunctiva/sclera: Conjunctivae normal.  Cardiovascular:     Rate and Rhythm: Normal rate and regular rhythm.  Pulmonary:     Effort: Pulmonary effort is normal. No respiratory distress.     Breath sounds: Normal breath sounds. No stridor.  Abdominal:     General: There is no distension.   Skin:    General: Skin is warm and dry.  Neurological:     Mental Status: She is alert and oriented to person, place, and time.     Cranial Nerves: No cranial nerve deficit.     Motor: Atrophy present.     Comments: Age-appropriate atrophy otherwise neuro exam unremarkable.  No facial asymmetry, no speech difficulty no tremor.  Psychiatric:        Mood and Affect: Mood normal.     ED Results / Procedures / Treatments   Labs (all labs ordered are listed, but only abnormal results are displayed) Labs Reviewed  SARS CORONAVIRUS 2 BY RT PCR - Abnormal; Notable for the following components:      Result Value   SARS Coronavirus 2 by RT PCR POSITIVE (*)    All other components within normal limits  CBC - Abnormal; Notable for the following components:   WBC 14.4 (*)    All other components within normal limits  DIFFERENTIAL - Abnormal; Notable for the following components:   Neutro Abs 10.2 (*)    Monocytes Absolute 1.7 (*)    Abs Immature Granulocytes 0.36 (*)    All other components within normal limits  COMPREHENSIVE METABOLIC PANEL - Abnormal; Notable for the following components:   Chloride 97 (*)    GFR, Estimated 59 (*)    All other components within normal limits  URINALYSIS, ROUTINE W REFLEX MICROSCOPIC - Abnormal; Notable for the following components:   Color, Urine STRAW (*)    All other components within normal limits  BRAIN NATRIURETIC PEPTIDE - Abnormal; Notable for the following components:   B Natriuretic Peptide 314.0 (*)    All other components within normal limits  I-STAT CHEM 8, ED - Abnormal; Notable for the following components:   Sodium 134 (*)    All other components within normal limits  PROTIME-INR  APTT  ETHANOL  RAPID URINE DRUG SCREEN, HOSP PERFORMED    EKG EKG Interpretation  Date/Time:  Saturday January 11 2022 15:27:10 EDT Ventricular Rate:  83 PR Interval:  230 QRS Duration: 140 QT Interval:  433 QTC Calculation: 509 R  Axis:   125 Text Interpretation: Sinus rhythm Prolonged PR interval Nonspecific intraventricular conduction delay Lateral infarct, age indeterminate Anteroseptal infarct, old Baseline wander Abnormal ECG Confirmed by Carmin Muskrat [phone]) on 01/11/2022 3:42:32 PM  Radiology CT Head Wo Contrast  Result Date: 01/11/2022 CLINICAL DATA:  Mental status change of unknown cause. Right sided intermittent facial numbness for 2 weeks. EXAM: CT HEAD WITHOUT CONTRAST TECHNIQUE: Contiguous axial images were obtained from the base of the skull through the vertex without intravenous contrast. RADIATION DOSE REDUCTION: This exam was performed according to the departmental dose-optimization program which includes automated exposure control, adjustment of the mA and/or kV according to patient size and/or use of  iterative reconstruction technique. COMPARISON:  09/18/2021 FINDINGS: Brain: No evidence of acute infarction, hemorrhage, hydrocephalus, extra-axial collection or mass lesion/mass effect. Age appropriate ventricular sulcal enlargement. Patchy white matter hypoattenuation consistent with mild to moderate chronic microvascular ischemic change. These findings are stable. Vascular: No hyperdense vessel or unexpected calcification. Skull: Normal. Negative for fracture or focal lesion. Sinuses/Orbits: Globes and orbits are unremarkable. Visualized sinuses are clear. Other: None. IMPRESSION: 1. No acute intracranial abnormalities. Electronically Signed   By: Lajean Manes M.D.   On: 01/11/2022 17:09   DG Chest 2 View  Result Date: 01/11/2022 CLINICAL DATA:  Intermittent right-sided facial numbness, weakness, leukocytosis EXAM: CHEST - 2 VIEW COMPARISON:  11/02/2021 FINDINGS: Frontal and lateral views of the chest demonstrates stable single lead pacer. Cardiac silhouette is unremarkable. No acute airspace disease, effusion, or pneumothorax. Chronic interstitial scarring. Chronic lower thoracic compression deformity  unchanged. No acute bony abnormality. IMPRESSION: 1. No acute intrathoracic process. Electronically Signed   By: Randa Ngo M.D.   On: 01/11/2022 16:38    Procedures Procedures    Medications Ordered in ED Medications  sodium chloride 0.9 % bolus 500 mL (500 mLs Intravenous New Bag/Given 01/11/22 1630)    Followed by  0.9 %  sodium chloride infusion (100 mL/hr Intravenous New Bag/Given 01/11/22 1630)    ED Course/ Medical Decision Making/ A&P This patient with a Hx of multiple medical issues including TIA, A-fib, pacemaker presents to the ED for concern of mass and right facial numbness, this involves an extensive number of treatment options, and is a complaint that carries with it a high risk of complications and morbidity.    The differential diagnosis includes CVA, electrolyte abnormalities, neuropathy, facial nerve palsy less likely arrhythmia   Social Determinants of Health:  Advanced age  Additional history obtained:  Additional history and/or information obtained from EMS and chart review, notable for EMS provides details in HPI included above, chart review notable for discharge summary from last month following admission for weakness.   After the initial evaluation, orders, including: Labs CT x-ray were initiated.   Patient placed on Cardiac and Pulse-Oximetry Monitors. The patient was maintained on a cardiac monitor.  The cardiac monitored showed an rhythm of 80 sinus normal The patient was also maintained on pulse oximetry. The readings were typically 100% room air normal  Dispostion / Final MDM: On repeat evaluation of the patient improved Patient notes the tightness sensation on the right side of her face has improved on repeat evaluation.  She and I had a lengthy conversation about her findings, including unremarkable head CT, x-ray, and labs most notable for mild leukocytosis and COVID-positive result.  Given the persistency of weakness for about a month, lower  suspicion for acute new COVID infection, patient not a candidate for novel therapy.  Absent hemodynamic instability, new oxygen requirement, distress, the patient is appropriate for home care though hospitalization was a consideration given her advanced age. Lab Tests:  I personally interpreted labs.  The pertinent results include: As above leukocytosis, COVID  Imaging Studies ordered:  I independently visualized and interpreted imaging which showed head CT, chest x-ray both reviewed no acute intracranial abnormality, no evidence for pneumonia I agree with the radiologist interpretation     Final Clinical Impression(s) / ED Diagnoses Final diagnoses:  COVID     Carmin Muskrat, MD 01/11/22 1835

## 2022-01-11 NOTE — Discharge Instructions (Signed)
Please monitor your condition carefully and do not hesitate to return here.  Otherwise, follow-up with your physician as needed.

## 2022-01-11 NOTE — ED Triage Notes (Signed)
Pt to the ED from home alone with complaints of right side intermittent facial numbness x 2 wks.

## 2022-01-16 ENCOUNTER — Telehealth: Payer: Self-pay

## 2022-01-16 NOTE — Telephone Encounter (Signed)
     Patient  visit on 10/7  at [facility] you been able to follow up with your primary care physician? YES  The patient was or was not able to obtain any needed medicine or equipment. YES  Are there diet recommendations that you are having difficulty following? NA  Patient expresses understanding of discharge instructions and education provided has no other needs at this time. [place], [facility], Care Management  [phone] [address], [place], [place] [address] Phone: [phone] Email: [email or web address]

## 2022-01-20 DIAGNOSIS — Z6821 Body mass index (BMI) 21.0-21.9, adult: Secondary | ICD-10-CM | POA: Diagnosis not present

## 2022-01-20 DIAGNOSIS — I10 Essential (primary) hypertension: Secondary | ICD-10-CM | POA: Diagnosis not present

## 2022-01-20 DIAGNOSIS — H5710 Ocular pain, unspecified eye: Secondary | ICD-10-CM | POA: Diagnosis not present

## 2022-01-20 DIAGNOSIS — E7800 Pure hypercholesterolemia, unspecified: Secondary | ICD-10-CM | POA: Diagnosis not present

## 2022-01-20 DIAGNOSIS — Z299 Encounter for prophylactic measures, unspecified: Secondary | ICD-10-CM | POA: Diagnosis not present

## 2022-01-20 DIAGNOSIS — Z0000 Encounter for general adult medical examination without abnormal findings: Secondary | ICD-10-CM | POA: Diagnosis not present

## 2022-01-28 DIAGNOSIS — Z79899 Other long term (current) drug therapy: Secondary | ICD-10-CM | POA: Diagnosis not present

## 2022-01-28 DIAGNOSIS — Z7901 Long term (current) use of anticoagulants: Secondary | ICD-10-CM | POA: Diagnosis not present

## 2022-01-28 DIAGNOSIS — Z743 Need for continuous supervision: Secondary | ICD-10-CM | POA: Diagnosis not present

## 2022-01-28 DIAGNOSIS — R6889 Other general symptoms and signs: Secondary | ICD-10-CM | POA: Diagnosis not present

## 2022-01-28 DIAGNOSIS — R58 Hemorrhage, not elsewhere classified: Secondary | ICD-10-CM | POA: Diagnosis not present

## 2022-01-28 DIAGNOSIS — R040 Epistaxis: Secondary | ICD-10-CM | POA: Diagnosis not present

## 2022-01-28 DIAGNOSIS — I509 Heart failure, unspecified: Secondary | ICD-10-CM | POA: Diagnosis not present

## 2022-01-28 DIAGNOSIS — Z7982 Long term (current) use of aspirin: Secondary | ICD-10-CM | POA: Diagnosis not present

## 2022-02-05 ENCOUNTER — Inpatient Hospital Stay (HOSPITAL_COMMUNITY)
Admission: RE | Admit: 2022-02-05 | Discharge: 2022-02-12 | DRG: 536 | Disposition: A | Discharge status: Skilled Nursing Facility | Payer: Medicare Other | Source: Other Acute Inpatient Hospital | Attending: Internal Medicine | Admitting: Internal Medicine

## 2022-02-05 ENCOUNTER — Encounter (HOSPITAL_COMMUNITY): Payer: Self-pay

## 2022-02-05 DIAGNOSIS — E785 Hyperlipidemia, unspecified: Secondary | ICD-10-CM | POA: Diagnosis not present

## 2022-02-05 DIAGNOSIS — I110 Hypertensive heart disease with heart failure: Secondary | ICD-10-CM | POA: Diagnosis not present

## 2022-02-05 DIAGNOSIS — F32A Depression, unspecified: Secondary | ICD-10-CM | POA: Diagnosis not present

## 2022-02-05 DIAGNOSIS — Z981 Arthrodesis status: Secondary | ICD-10-CM | POA: Diagnosis not present

## 2022-02-05 DIAGNOSIS — E871 Hypo-osmolality and hyponatremia: Secondary | ICD-10-CM | POA: Diagnosis not present

## 2022-02-05 DIAGNOSIS — T40605A Adverse effect of unspecified narcotics, initial encounter: Secondary | ICD-10-CM | POA: Diagnosis not present

## 2022-02-05 DIAGNOSIS — M1990 Unspecified osteoarthritis, unspecified site: Secondary | ICD-10-CM | POA: Diagnosis present

## 2022-02-05 DIAGNOSIS — S72001A Fracture of unspecified part of neck of right femur, initial encounter for closed fracture: Principal | ICD-10-CM | POA: Diagnosis present

## 2022-02-05 DIAGNOSIS — I495 Sick sinus syndrome: Secondary | ICD-10-CM | POA: Diagnosis not present

## 2022-02-05 DIAGNOSIS — Z7982 Long term (current) use of aspirin: Secondary | ICD-10-CM | POA: Diagnosis not present

## 2022-02-05 DIAGNOSIS — Z7901 Long term (current) use of anticoagulants: Secondary | ICD-10-CM

## 2022-02-05 DIAGNOSIS — I2510 Atherosclerotic heart disease of native coronary artery without angina pectoris: Secondary | ICD-10-CM | POA: Diagnosis present

## 2022-02-05 DIAGNOSIS — I739 Peripheral vascular disease, unspecified: Secondary | ICD-10-CM | POA: Diagnosis present

## 2022-02-05 DIAGNOSIS — I10 Essential (primary) hypertension: Secondary | ICD-10-CM | POA: Diagnosis present

## 2022-02-05 DIAGNOSIS — Z79899 Other long term (current) drug therapy: Secondary | ICD-10-CM

## 2022-02-05 DIAGNOSIS — S7224XA Nondisplaced subtrochanteric fracture of right femur, initial encounter for closed fracture: Secondary | ICD-10-CM | POA: Diagnosis not present

## 2022-02-05 DIAGNOSIS — S92344A Nondisplaced fracture of fourth metatarsal bone, right foot, initial encounter for closed fracture: Secondary | ICD-10-CM | POA: Diagnosis not present

## 2022-02-05 DIAGNOSIS — Z950 Presence of cardiac pacemaker: Secondary | ICD-10-CM

## 2022-02-05 DIAGNOSIS — S92354A Nondisplaced fracture of fifth metatarsal bone, right foot, initial encounter for closed fracture: Secondary | ICD-10-CM | POA: Diagnosis not present

## 2022-02-05 DIAGNOSIS — G629 Polyneuropathy, unspecified: Secondary | ICD-10-CM | POA: Diagnosis present

## 2022-02-05 DIAGNOSIS — Z8542 Personal history of malignant neoplasm of other parts of uterus: Secondary | ICD-10-CM

## 2022-02-05 DIAGNOSIS — G4700 Insomnia, unspecified: Secondary | ICD-10-CM | POA: Diagnosis not present

## 2022-02-05 DIAGNOSIS — E782 Mixed hyperlipidemia: Secondary | ICD-10-CM | POA: Diagnosis not present

## 2022-02-05 DIAGNOSIS — L89321 Pressure ulcer of left buttock, stage 1: Secondary | ICD-10-CM | POA: Diagnosis present

## 2022-02-05 DIAGNOSIS — R627 Adult failure to thrive: Secondary | ICD-10-CM | POA: Diagnosis present

## 2022-02-05 DIAGNOSIS — M810 Age-related osteoporosis without current pathological fracture: Secondary | ICD-10-CM | POA: Diagnosis not present

## 2022-02-05 DIAGNOSIS — Z96641 Presence of right artificial hip joint: Secondary | ICD-10-CM | POA: Diagnosis not present

## 2022-02-05 DIAGNOSIS — Z743 Need for continuous supervision: Secondary | ICD-10-CM | POA: Diagnosis not present

## 2022-02-05 DIAGNOSIS — I482 Chronic atrial fibrillation, unspecified: Secondary | ICD-10-CM | POA: Diagnosis present

## 2022-02-05 DIAGNOSIS — R112 Nausea with vomiting, unspecified: Secondary | ICD-10-CM | POA: Diagnosis not present

## 2022-02-05 DIAGNOSIS — R5383 Other fatigue: Secondary | ICD-10-CM | POA: Diagnosis not present

## 2022-02-05 DIAGNOSIS — Z8673 Personal history of transient ischemic attack (TIA), and cerebral infarction without residual deficits: Secondary | ICD-10-CM | POA: Diagnosis not present

## 2022-02-05 DIAGNOSIS — I69398 Other sequelae of cerebral infarction: Secondary | ICD-10-CM | POA: Diagnosis not present

## 2022-02-05 DIAGNOSIS — Z515 Encounter for palliative care: Secondary | ICD-10-CM | POA: Diagnosis not present

## 2022-02-05 DIAGNOSIS — W010XXA Fall on same level from slipping, tripping and stumbling without subsequent striking against object, initial encounter: Secondary | ICD-10-CM | POA: Diagnosis not present

## 2022-02-05 DIAGNOSIS — M25551 Pain in right hip: Secondary | ICD-10-CM | POA: Diagnosis not present

## 2022-02-05 DIAGNOSIS — R202 Paresthesia of skin: Secondary | ICD-10-CM | POA: Diagnosis present

## 2022-02-05 DIAGNOSIS — I160 Hypertensive urgency: Secondary | ICD-10-CM | POA: Diagnosis not present

## 2022-02-05 DIAGNOSIS — I5032 Chronic diastolic (congestive) heart failure: Secondary | ICD-10-CM | POA: Diagnosis not present

## 2022-02-05 DIAGNOSIS — Z8249 Family history of ischemic heart disease and other diseases of the circulatory system: Secondary | ICD-10-CM

## 2022-02-05 DIAGNOSIS — M9701XA Periprosthetic fracture around internal prosthetic right hip joint, initial encounter: Secondary | ICD-10-CM | POA: Diagnosis not present

## 2022-02-05 DIAGNOSIS — W0110XA Fall on same level from slipping, tripping and stumbling with subsequent striking against unspecified object, initial encounter: Secondary | ICD-10-CM | POA: Diagnosis not present

## 2022-02-05 DIAGNOSIS — M25572 Pain in left ankle and joints of left foot: Secondary | ICD-10-CM | POA: Diagnosis not present

## 2022-02-05 DIAGNOSIS — Z825 Family history of asthma and other chronic lower respiratory diseases: Secondary | ICD-10-CM

## 2022-02-05 DIAGNOSIS — Y92014 Private driveway to single-family (private) house as the place of occurrence of the external cause: Secondary | ICD-10-CM | POA: Diagnosis not present

## 2022-02-05 DIAGNOSIS — R531 Weakness: Secondary | ICD-10-CM | POA: Diagnosis not present

## 2022-02-05 DIAGNOSIS — R638 Other symptoms and signs concerning food and fluid intake: Secondary | ICD-10-CM | POA: Diagnosis not present

## 2022-02-05 DIAGNOSIS — Z823 Family history of stroke: Secondary | ICD-10-CM

## 2022-02-05 DIAGNOSIS — R6889 Other general symptoms and signs: Secondary | ICD-10-CM | POA: Diagnosis not present

## 2022-02-05 DIAGNOSIS — K219 Gastro-esophageal reflux disease without esophagitis: Secondary | ICD-10-CM | POA: Diagnosis present

## 2022-02-05 DIAGNOSIS — S72009A Fracture of unspecified part of neck of unspecified femur, initial encounter for closed fracture: Principal | ICD-10-CM | POA: Diagnosis present

## 2022-02-05 DIAGNOSIS — Z955 Presence of coronary angioplasty implant and graft: Secondary | ICD-10-CM

## 2022-02-05 DIAGNOSIS — Z833 Family history of diabetes mellitus: Secondary | ICD-10-CM

## 2022-02-05 DIAGNOSIS — Z7902 Long term (current) use of antithrombotics/antiplatelets: Secondary | ICD-10-CM | POA: Diagnosis not present

## 2022-02-05 DIAGNOSIS — I509 Heart failure, unspecified: Secondary | ICD-10-CM | POA: Diagnosis not present

## 2022-02-05 DIAGNOSIS — Z66 Do not resuscitate: Secondary | ICD-10-CM | POA: Diagnosis present

## 2022-02-05 LAB — CBC
HCT: 36 % (ref 36.0–46.0)
Hemoglobin: 11.8 g/dL — ABNORMAL LOW (ref 12.0–15.0)
MCH: 29.9 pg (ref 26.0–34.0)
MCHC: 32.8 g/dL (ref 30.0–36.0)
MCV: 91.4 fL (ref 80.0–100.0)
Platelets: 227 10*3/uL (ref 150–400)
RBC: 3.94 MIL/uL (ref 3.87–5.11)
RDW: 14.6 % (ref 11.5–15.5)
WBC: 13.3 10*3/uL — ABNORMAL HIGH (ref 4.0–10.5)
nRBC: 0 % (ref 0.0–0.2)

## 2022-02-05 LAB — COMPREHENSIVE METABOLIC PANEL
ALT: 15 U/L (ref 0–44)
AST: 22 U/L (ref 15–41)
Albumin: 3.4 g/dL — ABNORMAL LOW (ref 3.5–5.0)
Alkaline Phosphatase: 69 U/L (ref 38–126)
Anion gap: 14 (ref 5–15)
BUN: 13 mg/dL (ref 8–23)
CO2: 24 mmol/L (ref 22–32)
Calcium: 9.6 mg/dL (ref 8.9–10.3)
Chloride: 100 mmol/L (ref 98–111)
Creatinine, Ser: 1.03 mg/dL — ABNORMAL HIGH (ref 0.44–1.00)
GFR, Estimated: 49 mL/min — ABNORMAL LOW (ref >60)
Glucose, Bld: 136 mg/dL — ABNORMAL HIGH (ref 70–99)
Potassium: 4.3 mmol/L (ref 3.5–5.1)
Sodium: 138 mmol/L (ref 135–145)
Total Bilirubin: 1.1 mg/dL (ref 0.3–1.2)
Total Protein: 6 g/dL — ABNORMAL LOW (ref 6.5–8.1)

## 2022-02-05 LAB — PROTIME-INR
INR: 1.2 (ref 0.8–1.2)
Prothrombin Time: 15.4 seconds — ABNORMAL HIGH (ref 11.4–15.2)

## 2022-02-05 LAB — MAGNESIUM: Magnesium: 1.7 mg/dL (ref 1.7–2.4)

## 2022-02-05 MED ORDER — MIRTAZAPINE 15 MG PO TBDP
15.0000 mg | ORAL_TABLET | Freq: Every day | ORAL | Status: DC
  Administered 2022-02-05 – 2022-02-11 (×6): 15 mg via ORAL
  Filled 2022-02-05 (×7): qty 1

## 2022-02-05 MED ORDER — OXYCODONE HCL 5 MG PO TABS
5.0000 mg | ORAL_TABLET | ORAL | Status: DC | PRN
  Administered 2022-02-07 (×2): 5 mg via ORAL
  Filled 2022-02-05 (×2): qty 1

## 2022-02-05 MED ORDER — METOPROLOL SUCCINATE ER 25 MG PO TB24
25.0000 mg | ORAL_TABLET | Freq: Two times a day (BID) | ORAL | Status: DC
  Administered 2022-02-05 – 2022-02-12 (×12): 25 mg via ORAL
  Filled 2022-02-05 (×14): qty 1

## 2022-02-05 MED ORDER — ACETAMINOPHEN 650 MG RE SUPP: 650.0000 mg | Freq: Four times a day (QID) | RECTAL | Status: DC | PRN

## 2022-02-05 MED ORDER — SENNOSIDES-DOCUSATE SODIUM 8.6-50 MG PO TABS: 1.0000 | ORAL_TABLET | Freq: Every evening | ORAL | Status: DC | PRN

## 2022-02-05 MED ORDER — MORPHINE SULFATE (PF) 2 MG/ML IV SOLN
2.0000 mg | INTRAVENOUS | Status: DC | PRN
  Administered 2022-02-08 – 2022-02-11 (×3): 2 mg via INTRAVENOUS
  Filled 2022-02-05 (×3): qty 1

## 2022-02-05 MED ORDER — ONDANSETRON HCL 4 MG/2ML IJ SOLN
4.0000 mg | Freq: Four times a day (QID) | INTRAMUSCULAR | Status: DC | PRN
  Administered 2022-02-08: 4 mg via INTRAVENOUS
  Filled 2022-02-05 (×2): qty 2

## 2022-02-05 MED ORDER — QUETIAPINE FUMARATE 25 MG PO TABS
25.0000 mg | ORAL_TABLET | Freq: Every day | ORAL | Status: DC
  Administered 2022-02-05 – 2022-02-11 (×6): 25 mg via ORAL
  Filled 2022-02-05 (×7): qty 1

## 2022-02-05 MED ORDER — CITALOPRAM HYDROBROMIDE 10 MG PO TABS
5.0000 mg | ORAL_TABLET | Freq: Every morning | ORAL | Status: DC
  Administered 2022-02-06 – 2022-02-12 (×7): 5 mg via ORAL
  Filled 2022-02-05 (×7): qty 1

## 2022-02-05 MED ORDER — ACETAMINOPHEN 325 MG PO TABS
650.0000 mg | ORAL_TABLET | Freq: Four times a day (QID) | ORAL | Status: DC | PRN
  Administered 2022-02-05 – 2022-02-06 (×3): 650 mg via ORAL
  Filled 2022-02-05 (×3): qty 2

## 2022-02-05 MED ORDER — ONDANSETRON HCL 4 MG PO TABS
4.0000 mg | ORAL_TABLET | Freq: Four times a day (QID) | ORAL | Status: DC | PRN
  Administered 2022-02-07 – 2022-02-08 (×2): 4 mg via ORAL
  Filled 2022-02-05 (×2): qty 1

## 2022-02-05 NOTE — Progress Notes (Signed)
Sonia Baller RN with [facility] called to give report on patient

## 2022-02-05 NOTE — H&P (Signed)
History and Physical    Patient: Karina [name] EVO:[id number] DOB: [birth year] DOA: 02/05/2022 DOS: the patient was seen and examined on 02/05/2022 PCP: [name], MD  Patient coming from: Outside Hospital  Chief Complaint: No chief complaint on file.  HPI: [name] is a 88 y.o. female with medical history significant of CAD, A-fib, GERD, prior hip fracture, hypertension, prior TIA who presented to an outside facility after a fall.  Patient was stepping out of her car walking into her house.  She typically wears braces on both her feet however she forgot to wear her right foot brace.  She tripped on her toe and fell to the ground with instant pain in her right hip.  She was on the ground for approximately 15 minutes before EMS was called and she was brought to the ER for further evaluation.  On arrival she was hypertensive with systolics in the 299B.  She made otherwise hemodynamically stable.  Labs were obtained which were notable for sodium 133, albumin 3.4, CBC unrevealing.  X-rays of right hip were obtained which demonstrated periprosthetic fracture.  Patient was transferred to [facility] for orthopedics evaluation with possible surgical intervention.  Review of Systems: As mentioned in the history of present illness. All other systems reviewed and are negative. Past Medical History:  Diagnosis Date   Actinic keratosis    Bilateral leg weakness 09/27/12   CAD (coronary artery disease)    EF 55% by 2 D Echo 2008 and 2012   Chronic atrial fibrillation (HCC)    Chronic edema 10/15/11   Chronic fatigue    DDD (degenerative disc disease)    Encounter for long-term (current) use of other medications 09/02/12   Endometrial carcinoma ([place])    Fibromuscular dysplasia ([place])    S/P PCI right renal aretery 1999   GERD (gastroesophageal reflux disease)    Hip fracture, right ([place]) 08/06/06   S/P surgery   Hx of cardiovascular stress test 09/2010   Nuclear stress testing showing no evidence  of ischemia, EF=76%, No EKG changes & no perfusion defects.   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   LVH (left ventricular hypertrophy)    Mild   Osteoarthritis    Osteoporosis    With Hx of thoracic compression fractures   Pacemaker    Paroxysmal atrial fibrillation (HCC)    Peripheral neuropathy    Peripheral vascular disease ([place])    Pre-diabetes 01/21/11   Chronic   Renovascular hypertension    Tachy-brady syndrome (HCC)    Thoracic compression fracture (HCC)    TIA (transient ischemic attack) 08/2011   OSH: [place], left LE numbness, CT negative   Tic [place] 1994   S/P successful surgery   Trigeminal neuralgia of right side of face    Past Surgical History:  Procedure Laterality Date   ABDOMINAL AORTAGRAM  09/05/01   Right & left renals 25%   APPENDECTOMY  1946   CARDIAC CATHETERIZATION  11/05/01   EF 72%. RCA 25%. LCX 50%. Ramus 75/100%. LAD 95%. D2 75%.   [place]   CHOLECYSTECTOMY  1989   CORONARY ANGIOPLASTY     CORONARY ANGIOPLASTY WITH STENT PLACEMENT  11/05/01   PTCA/Stent to 95% mid LAD, Ramus lesion could not be crossed and was not  treated.    DIPYRIDAMOLE STRESS TEST  09/2010   Nuclear stress testing showing no evidence of ischemia, EF=76%, No EKG changes & no perfusion defects.   LUMBAR DISC SURGERY     ORIF FEMUR FRACTURE Right 02/16/2017   Procedure: OPEN REDUCTION INTERNAL FIXATION (ORIF) DISTAL FEMUR FRACTURE;  Surgeon: Shona Needles, MD;  Location: [facility];  Service: Orthopedics;  Laterality: Right;   RENAL ANGIOGRAM  09/14/97   25% diffuse left renal artery. 50% ostia; right renal artery.   RENAL ARTERY ANGIOPLASTY Right 1994   Renal artery stenosis secondary to fibromusclar dysplasia   RENAL ARTERY ANGIOPLASTY Right 09/13/97   PTA/Stent to ostial right renal  artery, No distal fibromuscular hyperplasia   SPINAL FUSION  2002   Percutaneous spinal fusion   TOTAL ABDOMINAL HYSTERECTOMY W/ BILATERAL SALPINGOOPHORECTOMY  1990   VAGINAL HYSTERECTOMY  1990   Social History:  reports that she has never smoked. She has never used smokeless tobacco. She reports current alcohol use. She reports that she does not use drugs.  Allergies  Allergen Reactions   Ace Inhibitors Hives   Amiodarone Other (See Comments)    Worsening peripheral neuropathy   Tikosyn [Dofetilide] Other (See Comments)    QT prolongation   Meperidine And Related Other (See Comments)    Unknown reaction    Family History  Problem Relation Age of Onset   Stroke Father    COPD Brother    Diabetes Brother        DM   Hypertension Brother    Stroke Other     Prior to Admission medications   Medication Sig Start Date End Date Taking? Authorizing Provider  apixaban (ELIQUIS) 2.5 MG TABS tablet Take 1 tablet (2.5 mg total) 2 (two) times daily by mouth. 02/23/17   Eloise Levels, MD  azithromycin (ZITHROMAX) 250 MG tablet Take by mouth. Patient not taking: Reported on 01/11/2022 01/01/22   [provider]  benzonatate (TESSALON) 200 MG capsule Take 200 mg by mouth daily as needed. Patient not taking: Reported on 01/11/2022 12/04/21   [provider]  citalopram (CELEXA) 10 MG tablet Take 5 mg by mouth every morning. 04/17/21   [provider]  cloNIDine (CATAPRES) 0.1 MG tablet Take 0.1 mg by mouth 3 (three) times daily. 05/07/09   [provider]  diltiazem (TIAZAC) 120 MG 24 hr capsule Take 120 mg by mouth daily. 12/12/21   [provider]  feeding supplement (ENSURE ENLIVE / ENSURE PLUS) LIQD Take 237 mLs by mouth 2 (two) times daily between meals. 11/05/21   Orson Eva, MD  furosemide (LASIX) 40 MG tablet Take 0.5 tablets (20 mg total) by mouth daily. 11/05/21   Orson Eva, MD  metoprolol succinate (TOPROL-XL) 25 MG 24 hr tablet Take 25 mg by  mouth 2 (two) times daily. 04/17/21   [provider]  mirtazapine (REMERON SOL-TAB) 15 MG disintegrating tablet Take 15 mg by mouth every morning.    [provider]  nitroGLYCERIN (NITROSTAT) 0.4 MG SL tablet     [provider]  ondansetron (ZOFRAN-ODT) 4 MG disintegrating tablet Take 4 mg by mouth 3 (three) times daily as needed. Patient not taking: Reported on 01/11/2022 09/16/21   [provider]  pantoprazole (PROTONIX) 40 MG tablet Take 40 mg by mouth every morning. Patient not taking: Reported on 01/11/2022    [provider]  QUEtiapine (SEROQUEL) 25 MG tablet Take by mouth. 09/21/21   [provider]  rosuvastatin (CRESTOR)  5 MG tablet Take 1 tablet (5 mg total) by mouth daily. Patient taking differently: Take 5 mg by mouth every morning. 12/21/18   Roxan Hockey, MD  valsartan (DIOVAN) 80 MG tablet Take 80 mg by mouth every morning.    [provider]    Physical Exam: Vitals:   02/05/22 1800  BP: (!) 169/101  Pulse: 74  Resp: 18  Temp: 97.9 F (36.6 C)  TempSrc: Oral  SpO2: 90%   Physical Exam Constitutional:      Appearance: She is normal weight.  HENT:     Head: Normocephalic.     Nose: Nose normal.     Mouth/Throat:     Mouth: Mucous membranes are moist.     Pharynx: Oropharynx is clear.  Eyes:     Pupils: Pupils are equal, round, and reactive to light.  Cardiovascular:     Rate and Rhythm: Normal rate and regular rhythm.  Pulmonary:     Effort: Pulmonary effort is normal.  Abdominal:     General: Abdomen is flat. Bowel sounds are normal.  Musculoskeletal:        General: Tenderness present.  Skin:    General: Skin is warm.     Capillary Refill: Capillary refill takes less than 2 seconds.  Neurological:     General: No focal deficit present.     Mental Status: She is alert. Mental status is at baseline.  Psychiatric:        Mood and Affect: Mood normal.      Assessment and Plan: Ms.  Brenn is a 88 year old wchronic atrial fibrillation, tachybradycardia syndrome status post pacemaker, hypertension,  hx AAA s/p endovascular repair, CAD sp PCI ho presented after a fall found to have a periprosthetic fracture.  She is relatively high functioning for her age.  She is able to walk around her house with a walker.  She gets most of her care from 2 people that live with her.  She reports prior falls with surgical repair.  #Acute periprosthetic fracture involving subtrochanteric femur, POA, active - Patient has surgical changes from right hip arthroplasty with acute fracture - Transferred for orthopedics evaluation Plan: Made n.p.o. at midnight Continue IV fluids Will need orthopedic consult in the morning Pain control with oxycodone and morphine  Depression/insomnia-continue Remeron and Celexa and Seroquel Hypertension  Chronic atrial fibrillation Tachybradycardia syndrome status post pacemaker - Continue metoprolol - Hold Eliquis  History of aortic aneurysm repair-continue to monitor blood pressure Hypertension-hold clonidine, valsartan given impending surgery   Advance Care Planning: \dnr   Severity of Illness: The appropriate patient status for this patient is OBSERVATION. Observation status is judged to be reasonable and necessary in order to provide the required intensity of service to ensure the patient's safety. The patient's presenting symptoms, physical exam findings, and initial radiographic and laboratory data in the context of their medical condition is felt to place them at decreased risk for further clinical deterioration. Furthermore, it is anticipated that the patient will be medically stable for discharge from the hospital within 2 midnights of admission.   Author: Emilee Hero, MD 02/05/2022 8:04 PM  For on call review [email or web address].

## 2022-02-05 NOTE — Plan of Care (Signed)

## 2022-02-06 ENCOUNTER — Encounter (HOSPITAL_COMMUNITY): Payer: Self-pay | Admitting: Internal Medicine

## 2022-02-06 ENCOUNTER — Inpatient Hospital Stay (HOSPITAL_COMMUNITY): Payer: Medicare Other

## 2022-02-06 ENCOUNTER — Other Ambulatory Visit: Payer: Self-pay

## 2022-02-06 DIAGNOSIS — R109 Unspecified abdominal pain: Secondary | ICD-10-CM | POA: Diagnosis not present

## 2022-02-06 DIAGNOSIS — F32A Depression, unspecified: Secondary | ICD-10-CM | POA: Diagnosis present

## 2022-02-06 DIAGNOSIS — I10 Essential (primary) hypertension: Secondary | ICD-10-CM | POA: Diagnosis not present

## 2022-02-06 DIAGNOSIS — I69398 Other sequelae of cerebral infarction: Secondary | ICD-10-CM | POA: Diagnosis not present

## 2022-02-06 DIAGNOSIS — I482 Chronic atrial fibrillation, unspecified: Secondary | ICD-10-CM | POA: Diagnosis not present

## 2022-02-06 DIAGNOSIS — S92344D Nondisplaced fracture of fourth metatarsal bone, right foot, subsequent encounter for fracture with routine healing: Secondary | ICD-10-CM | POA: Diagnosis not present

## 2022-02-06 DIAGNOSIS — G4700 Insomnia, unspecified: Secondary | ICD-10-CM | POA: Diagnosis present

## 2022-02-06 DIAGNOSIS — S7221XA Displaced subtrochanteric fracture of right femur, initial encounter for closed fracture: Secondary | ICD-10-CM | POA: Diagnosis not present

## 2022-02-06 DIAGNOSIS — Z8542 Personal history of malignant neoplasm of other parts of uterus: Secondary | ICD-10-CM | POA: Diagnosis not present

## 2022-02-06 DIAGNOSIS — S72141A Displaced intertrochanteric fracture of right femur, initial encounter for closed fracture: Secondary | ICD-10-CM | POA: Diagnosis not present

## 2022-02-06 DIAGNOSIS — S92355D Nondisplaced fracture of fifth metatarsal bone, left foot, subsequent encounter for fracture with routine healing: Secondary | ICD-10-CM | POA: Diagnosis not present

## 2022-02-06 DIAGNOSIS — R5383 Other fatigue: Secondary | ICD-10-CM | POA: Diagnosis not present

## 2022-02-06 DIAGNOSIS — Y92014 Private driveway to single-family (private) house as the place of occurrence of the external cause: Secondary | ICD-10-CM | POA: Diagnosis not present

## 2022-02-06 DIAGNOSIS — I2510 Atherosclerotic heart disease of native coronary artery without angina pectoris: Secondary | ICD-10-CM | POA: Diagnosis present

## 2022-02-06 DIAGNOSIS — E785 Hyperlipidemia, unspecified: Secondary | ICD-10-CM | POA: Diagnosis not present

## 2022-02-06 DIAGNOSIS — G629 Polyneuropathy, unspecified: Secondary | ICD-10-CM | POA: Diagnosis present

## 2022-02-06 DIAGNOSIS — Z4789 Encounter for other orthopedic aftercare: Secondary | ICD-10-CM | POA: Diagnosis not present

## 2022-02-06 DIAGNOSIS — L89321 Pressure ulcer of left buttock, stage 1: Secondary | ICD-10-CM | POA: Diagnosis present

## 2022-02-06 DIAGNOSIS — Z743 Need for continuous supervision: Secondary | ICD-10-CM | POA: Diagnosis not present

## 2022-02-06 DIAGNOSIS — M810 Age-related osteoporosis without current pathological fracture: Secondary | ICD-10-CM | POA: Diagnosis present

## 2022-02-06 DIAGNOSIS — M9701XA Periprosthetic fracture around internal prosthetic right hip joint, initial encounter: Secondary | ICD-10-CM | POA: Diagnosis not present

## 2022-02-06 DIAGNOSIS — S92344A Nondisplaced fracture of fourth metatarsal bone, right foot, initial encounter for closed fracture: Secondary | ICD-10-CM | POA: Diagnosis present

## 2022-02-06 DIAGNOSIS — S92354A Nondisplaced fracture of fifth metatarsal bone, right foot, initial encounter for closed fracture: Secondary | ICD-10-CM | POA: Diagnosis present

## 2022-02-06 DIAGNOSIS — I495 Sick sinus syndrome: Secondary | ICD-10-CM | POA: Diagnosis not present

## 2022-02-06 DIAGNOSIS — Z96641 Presence of right artificial hip joint: Secondary | ICD-10-CM | POA: Diagnosis not present

## 2022-02-06 DIAGNOSIS — S72001A Fracture of unspecified part of neck of right femur, initial encounter for closed fracture: Secondary | ICD-10-CM

## 2022-02-06 DIAGNOSIS — M9701XD Periprosthetic fracture around internal prosthetic right hip joint, subsequent encounter: Secondary | ICD-10-CM | POA: Diagnosis not present

## 2022-02-06 DIAGNOSIS — Z515 Encounter for palliative care: Secondary | ICD-10-CM | POA: Diagnosis not present

## 2022-02-06 DIAGNOSIS — R627 Adult failure to thrive: Secondary | ICD-10-CM | POA: Diagnosis present

## 2022-02-06 DIAGNOSIS — R112 Nausea with vomiting, unspecified: Secondary | ICD-10-CM | POA: Diagnosis not present

## 2022-02-06 DIAGNOSIS — S7224XA Nondisplaced subtrochanteric fracture of right femur, initial encounter for closed fracture: Secondary | ICD-10-CM | POA: Diagnosis present

## 2022-02-06 DIAGNOSIS — Z981 Arthrodesis status: Secondary | ICD-10-CM | POA: Diagnosis not present

## 2022-02-06 DIAGNOSIS — I160 Hypertensive urgency: Secondary | ICD-10-CM | POA: Diagnosis not present

## 2022-02-06 DIAGNOSIS — R638 Other symptoms and signs concerning food and fluid intake: Secondary | ICD-10-CM | POA: Diagnosis not present

## 2022-02-06 DIAGNOSIS — E119 Type 2 diabetes mellitus without complications: Secondary | ICD-10-CM | POA: Diagnosis not present

## 2022-02-06 DIAGNOSIS — I110 Hypertensive heart disease with heart failure: Secondary | ICD-10-CM | POA: Diagnosis not present

## 2022-02-06 DIAGNOSIS — Z950 Presence of cardiac pacemaker: Secondary | ICD-10-CM | POA: Diagnosis not present

## 2022-02-06 DIAGNOSIS — M79671 Pain in right foot: Secondary | ICD-10-CM | POA: Diagnosis not present

## 2022-02-06 DIAGNOSIS — I739 Peripheral vascular disease, unspecified: Secondary | ICD-10-CM | POA: Diagnosis present

## 2022-02-06 DIAGNOSIS — Z66 Do not resuscitate: Secondary | ICD-10-CM | POA: Diagnosis not present

## 2022-02-06 DIAGNOSIS — I5043 Acute on chronic combined systolic (congestive) and diastolic (congestive) heart failure: Secondary | ICD-10-CM | POA: Diagnosis not present

## 2022-02-06 DIAGNOSIS — M25551 Pain in right hip: Secondary | ICD-10-CM | POA: Diagnosis not present

## 2022-02-06 DIAGNOSIS — I480 Paroxysmal atrial fibrillation: Secondary | ICD-10-CM | POA: Diagnosis not present

## 2022-02-06 DIAGNOSIS — W010XXA Fall on same level from slipping, tripping and stumbling without subsequent striking against object, initial encounter: Secondary | ICD-10-CM | POA: Diagnosis present

## 2022-02-06 DIAGNOSIS — Z7901 Long term (current) use of anticoagulants: Secondary | ICD-10-CM | POA: Diagnosis not present

## 2022-02-06 DIAGNOSIS — T40605A Adverse effect of unspecified narcotics, initial encounter: Secondary | ICD-10-CM | POA: Diagnosis not present

## 2022-02-06 DIAGNOSIS — R531 Weakness: Secondary | ICD-10-CM | POA: Diagnosis not present

## 2022-02-06 DIAGNOSIS — W19XXXA Unspecified fall, initial encounter: Secondary | ICD-10-CM | POA: Diagnosis not present

## 2022-02-06 MED ORDER — POLYETHYLENE GLYCOL 3350 17 G PO PACK
17.0000 g | PACK | Freq: Two times a day (BID) | ORAL | Status: AC
  Administered 2022-02-06 – 2022-02-07 (×3): 17 g via ORAL
  Filled 2022-02-06 (×4): qty 1

## 2022-02-06 NOTE — Progress Notes (Signed)
[facility] PROGRESS NOTE    Progress Note  Karina [name]  BSJ:[id number] DOB: 04-27-1922 DOA: 02/05/2022 PCP: [name], MD     Brief Narrative:   Karina [name] is an 88 y.o. female past medical history significant for CAD, A-fib on Eliquis, essential hypertension, TIA comes in after fall where she tripped, x-ray of her right hip periprosthetic fracture, orthopedic surgery recommended transfer to [facility]   Assessment/Plan:   Acute periprosthetic closed right hip fracture: Patient is currently n.p.o. on IV fluids. Continue narcotics for pain Zofran for nausea. Started on a bowel regimen.  Depression/insomnia: Continue Remeron, Celexa and Seroquel.  Essential hypertension/hypertensive urgency: Elevated blood pressure on admission was likely due to pain that improved. Continue metoprolol blood pressure at goal.  Dyslipidemia Noted  Chronic a-fib ([place]): Rate controlled, resume Eliquis postsurgical.  Decubitus ulcer stage I present on admission RN Pressure Injury Documentation: Pressure Injury 05/06/21 Buttocks Left;Medial Stage 1 -  Intact skin with non-blanchable redness of a localized area usually over a bony prominence. red, non-blanchable (Active)  05/06/21 0000  Location: Buttocks  Location Orientation: Left;Medial  Staging: Stage 1 -  Intact skin with non-blanchable redness of a localized area usually over a bony prominence.  Wound Description (Comments): red, non-blanchable  Present on Admission: Yes    Estimated body mass index is 21.39 kg/m as calculated from the following:   Height as of 01/11/22: 5' 1.5" (1.562 m).   Weight as of 01/11/22: 52.2 kg.   DVT prophylaxis: lovenox Family Communication:none Status is: Observation The patient will require care spanning > 2 midnights and should be moved to inpatient because: Acute periprostatic right hip fracture    Code Status:     Code Status Orders  (From admission, onward)            Start     Ordered   02/05/22 2352  Do not attempt resuscitation (DNR)  Continuous       Question Answer Comment  In the event of cardiac or respiratory ARREST Do not call a "code blue"   In the event of cardiac or respiratory ARREST Do not perform Intubation, CPR, defibrillation or ACLS   In the event of cardiac or respiratory ARREST Use medication by any route, position, wound care, and other measures to relive pain and suffering. May use oxygen, suction and manual treatment of airway obstruction as needed for comfort.      02/05/22 2351           Code Status History     Date Active Date Inactive Code Status Order ID Comments User Context   02/05/2022 2017 02/05/2022 2351 Full Code [id number]  Emilee Hero, MD Inpatient   11/03/2021 0114 11/05/2021 1816 DNR [id number]  Truett Mainland, DO Inpatient   05/05/2021 1910 05/08/2021 1953 DNR [id number]  Rise Patience, MD ED   05/05/2021 1654 05/05/2021 1910 Full Code [id number]  Rise Patience, MD ED   02/23/2017 1739 03/19/2017 1741 Full Code [id number]  Cathlyn Parsons, PA-C Inpatient   02/23/2017 1739 02/23/2017 1739 Full Code [id number]  Cathlyn Parsons, PA-C Inpatient   02/15/2017 0004 02/23/2017 1726 Full Code [id number]  Bonnita Hollow, MD ED   01/18/2015 2135 01/20/2015 1637 Full Code [id number]  Waldemar Dickens, MD Inpatient   01/29/2014 1622 01/30/2014 1646 Full Code [id number]  Isaac Bliss, Rayford Halsted, MD Inpatient         IV Access:   Peripheral IV   Procedures and diagnostic studies:  No results found.   Medical Consultants:   None.   Subjective:    [name] she relates she does not have any pain as long she does not move.  Objective:    Vitals:   02/05/22 1800 02/05/22 2014 02/05/22 2205  BP: (!) 169/101  122/74  Pulse: 74  75  Resp: 18  18  Temp: 97.9 F (36.6 C)  97.8 F (36.6 C)  TempSrc: Oral  Oral  SpO2: 90% 91% 91%   SpO2: 91 % O2 Flow Rate (L/min): 0  L/min FiO2 (%): (!) 0 %  No intake or output data in the 24 hours ending 02/06/22 0759 There were no vitals filed for this visit.  Exam: General exam: In no acute distress. Respiratory system: Good air movement and clear to auscultation. Cardiovascular system: S1 & S2 heard, RRR. No JVD. Gastrointestinal system: Abdomen is nondistended, soft and nontender.  Extremities: No pedal edema. Skin: No rashes, lesions or ulcers Psychiatry: Judgement and insight appear normal. Mood & affect appropriate.    Data Reviewed:    Labs: Basic Metabolic Panel: Recent Labs  Lab 02/05/22 2050  NA 138  K 4.3  CL 100  CO2 24  GLUCOSE 136*  BUN 13  CREATININE 1.03*  CALCIUM 9.6  MG 1.7   GFR CrCl cannot be calculated (Unknown ideal weight.). Liver Function Tests: Recent Labs  Lab 02/05/22 2050  AST 22  ALT 15  ALKPHOS 69  BILITOT 1.1  PROT 6.0*  ALBUMIN 3.4*   No results for input(s): "LIPASE", "AMYLASE" in the last 168 hours. No results for input(s): "AMMONIA" in the last 168 hours. Coagulation profile Recent Labs  Lab 02/05/22 2050  INR 1.2   COVID-19 Labs  No results for input(s): "DDIMER", "FERRITIN", "LDH", "CRP" in the last 72 hours.  Lab Results  Component Value Date   SARSCOV2NAA POSITIVE (A) 01/11/2022   SARSCOV2NAA POSITIVE (A) 05/03/2021   [place] Not Detected 04/06/2019   [place] Not Detected 03/22/2019    CBC: Recent Labs  Lab 02/05/22 2050  WBC 13.3*  HGB 11.8*  HCT 36.0  MCV 91.4  PLT 227   Cardiac Enzymes: No results for input(s): "CKTOTAL", "CKMB", "CKMBINDEX", "TROPONINI" in the last 168 hours. BNP (last 3 results) No results for input(s): "PROBNP" in the last 8760 hours. CBG: No results for input(s): "GLUCAP" in the last 168 hours. D-Dimer: No results for input(s): "DDIMER" in the last 72 hours. Hgb A1c: No results for input(s): "HGBA1C" in the last 72 hours. Lipid Profile: No results for input(s): "CHOL", "HDL", "LDLCALC",  "TRIG", "CHOLHDL", "LDLDIRECT" in the last 72 hours. Thyroid function studies: No results for input(s): "TSH", "T4TOTAL", "T3FREE", "THYROIDAB" in the last 72 hours.  Invalid input(s): "FREET3" Anemia work up: No results for input(s): "VITAMINB12", "FOLATE", "FERRITIN", "TIBC", "IRON", "RETICCTPCT" in the last 72 hours. Sepsis Labs: Recent Labs  Lab 02/05/22 2050  WBC 13.3*   Microbiology No results found for this or any previous visit (from the past 240 hour(s)).   Medications:    citalopram  5 mg Oral q morning   metoprolol succinate  25 mg Oral BID   mirtazapine  15 mg Oral QHS   QUEtiapine  25 mg Oral QHS   Continuous Infusions:    LOS: 1 day   Charlynne Cousins  [facility]  02/06/2022, 7:59 AM

## 2022-02-06 NOTE — Consult Note (Signed)
ORTHOPAEDIC CONSULTATION  REQUESTING PHYSICIAN: Charlynne Cousins, MD  Chief Complaint: right hip pain  HPI: Karina [name] is a 88 y.o. female who complains of right hip pain after falling at home. She normally wears braces on her ankles but forgot to put the one on her right side. This caused her foot to drag on the ground and she fell. She had immediate pain and inability to bear weight on the leg. She has a history of a right THA and right distal femur ORIF.   Imaging shows a mildly displaced, comminuted fracture of the proximal subtrochanteric/intertrochanteric region around the stem of the femoral prosthesis.    Orthopedics was consulted for evaluation.    No history of MI, CVA, DVT, PE.  + h/o TIA and AAA. Previously ambulatory with the use of assistive device.  The patient is living at home alone.    Past Medical History:  Diagnosis Date   Actinic keratosis    Bilateral leg weakness 09/27/12   CAD (coronary artery disease)    EF 55% by 2 D Echo 2008 and 2012   Chronic atrial fibrillation (HCC)    Chronic edema 10/15/11   Chronic fatigue    DDD (degenerative disc disease)    Encounter for long-term (current) use of other medications 09/02/12   Endometrial carcinoma ([place])    Fibromuscular dysplasia ([place])    S/P PCI right renal aretery 1999   GERD (gastroesophageal reflux disease)    Hip fracture, right ([place]) 08/06/06   S/P surgery   Hx of cardiovascular stress test 09/2010   Nuclear stress testing showing no evidence of ischemia, EF=76%, No EKG changes & no perfusion defects.   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   LVH (left ventricular hypertrophy)    Mild   Osteoarthritis    Osteoporosis    With Hx of thoracic compression fractures   Pacemaker    Paroxysmal atrial fibrillation (HCC)    Peripheral  neuropathy    Peripheral vascular disease ([place])    Pre-diabetes 01/21/11   Chronic   Renovascular hypertension    Tachy-brady syndrome (HCC)    Thoracic compression fracture (HCC)    TIA (transient ischemic attack) 08/2011   OSH: [place], left LE numbness, CT negative   [place] 1994   S/P successful surgery   Trigeminal neuralgia of right side of face    Past Surgical History:  Procedure Laterality Date   ABDOMINAL AORTAGRAM  09/05/01   Right & left renals 25%   APPENDECTOMY  1946   CARDIAC CATHETERIZATION  11/05/01   EF 72%. RCA 25%. LCX 50%. Ramus 75/100%. LAD 95%. D2 75%.   [place]   CORONARY ANGIOPLASTY     CORONARY ANGIOPLASTY WITH STENT PLACEMENT  11/05/01   PTCA/Stent to 95% mid LAD, Ramus lesion could not be crossed and was not treated.    DIPYRIDAMOLE STRESS TEST  09/2010   Nuclear stress testing showing no evidence of ischemia, EF=76%, No EKG changes & no perfusion defects.   LUMBAR DISC SURGERY     ORIF FEMUR FRACTURE Right 02/16/2017   Procedure: OPEN REDUCTION INTERNAL FIXATION (ORIF) DISTAL FEMUR FRACTURE;  Surgeon: Shona Needles, MD;  Location: [facility];  Service: Orthopedics;  Laterality: Right;   RENAL ANGIOGRAM  09/14/97   25% diffuse left renal artery. 50% ostia; right renal artery.   RENAL ARTERY ANGIOPLASTY Right 1994   Renal artery stenosis secondary to fibromusclar dysplasia   RENAL ARTERY ANGIOPLASTY Right 09/13/97   PTA/Stent to ostial right renal artery, No distal fibromuscular hyperplasia   SPINAL FUSION  2002   Percutaneous spinal fusion   TOTAL ABDOMINAL HYSTERECTOMY W/ BILATERAL SALPINGOOPHORECTOMY  1990   VAGINAL HYSTERECTOMY  1990   Social History   Socioeconomic History   Marital status: Widowed    Spouse name: Not on file   Number of children: Not on file   Years of education: Not on file   Highest education level: Not on file  Occupational History   Not on file  Tobacco  Use   Smoking status: Never   Smokeless tobacco: Never  Vaping Use   Vaping Use: Never used  Substance and Sexual Activity   Alcohol use: Yes    Alcohol/week: 0.0 standard drinks of alcohol    Comment: Rarely   Drug use: No   Sexual activity: Not Currently  Other Topics Concern   Not on file  Social History Narrative   Not on file   Social Determinants of Health   Financial Resource Strain: Not on file  Food Insecurity: No Food Insecurity (02/06/2022)   Hunger Vital Sign    Worried About Running Out of Food in the Last Year: Never true    Ran Out of Food in the Last Year: Never true  Transportation Needs: No Transportation Needs (02/06/2022)   PRAPARE - Hydrologist (Medical): No    Lack of Transportation (Non-Medical): No  Physical Activity: Not on file  Stress: Not on file  Social Connections: Not on file   Family History  Problem Relation Age of Onset   Stroke Father    COPD Brother    Diabetes Brother        DM   Hypertension Brother    Stroke Other    Allergies  Allergen Reactions   Ace Inhibitors Hives   Amiodarone Other (See Comments)    Worsening peripheral neuropathy   Tikosyn [Dofetilide] Other (See Comments)    QT prolongation   Meperidine And Related Other (See Comments)    Unknown reaction   Prior to Admission medications   Medication Sig Start Date End Date Taking? Authorizing Provider  apixaban (ELIQUIS) 2.5 MG TABS tablet Take 1 tablet (2.5 mg total) 2 (two) times daily by mouth. 02/23/17  Yes Eloise Levels, MD  citalopram (CELEXA) 10 MG tablet Take 5 mg by mouth every morning. 04/17/21  Yes [provider]  cloNIDine (CATAPRES) 0.1 MG tablet Take 0.1 mg by mouth 3 (three) times daily. 05/07/09  Yes [provider]  diltiazem (TIAZAC) 120 MG 24 hr capsule Take 120 mg by mouth daily. 12/12/21  Yes [provider]  feeding supplement (ENSURE ENLIVE / ENSURE PLUS) LIQD Take 237 mLs by mouth 2  (two) times daily between meals. 11/05/21  Yes Tat, Shanon Brow, MD  furosemide (LASIX) 40 MG tablet Take 0.5 tablets (20 mg total) by mouth daily. 11/05/21  Yes Tat, Shanon Brow, MD  metoprolol succinate (TOPROL-XL) 25 MG 24 hr tablet Take 25 mg by mouth 2 (two) times daily. 04/17/21  Yes [provider]  mirtazapine (REMERON SOL-TAB) 15 MG disintegrating tablet Take 15 mg by mouth every morning.   Yes [provider]  neomycin-polymyxin b-dexamethasone (MAXITROL)  3.5-10000-0.1 SUSP Place 2 drops into both eyes 3 (three) times daily. 01/20/22  Yes [provider]  nitroGLYCERIN (NITROSTAT) 0.4 MG SL tablet    Yes [provider]  rosuvastatin (CRESTOR) 5 MG tablet Take 1 tablet (5 mg total) by mouth daily. Patient taking differently: Take 5 mg by mouth every morning. 12/21/18  Yes Emokpae, Courage, MD  valsartan (DIOVAN) 80 MG tablet Take 80 mg by mouth every morning.   Yes [provider]  azithromycin (ZITHROMAX) 250 MG tablet Take by mouth. Patient not taking: Reported on 01/11/2022 01/01/22   [provider]  benzonatate (TESSALON) 200 MG capsule Take 200 mg by mouth daily as needed. Patient not taking: Reported on 01/11/2022 12/04/21   [provider]  pantoprazole (PROTONIX) 40 MG tablet Take 40 mg by mouth every morning. Patient not taking: Reported on 01/11/2022    [provider]   DG Pelvis 1-2 Views  Result Date: 02/06/2022 CLINICAL DATA:  Right hip pain. EXAM: PELVIS - 1-2 VIEW COMPARISON:  Pelvic/right hip radiographs 02/05/2022 FINDINGS: Sequelae of right hip arthroplasty are again identified. A mildly displaced, comminuted periprosthetic fracture of the proximal right femur involving the subtrochanteric and likely intertrochanteric regions is unchanged from yesterday's study. Prior plate and screw fixation of the more distal right femur and prior lumbar fusion are partially visualized. Extensive atherosclerotic vascular  calcifications, an aortoiliac stent graft, and numerous pelvic and lower abdominal surgical clips are present. IMPRESSION: Unchanged periprosthetic fracture of the proximal right femur. Electronically Signed   By: Logan Bores M.D.   On: 02/06/2022 10:42   DG FEMUR, MIN 2 VIEWS RIGHT  Result Date: 02/06/2022 CLINICAL DATA:  Right hip pain. EXAM: RIGHT FEMUR 2 VIEWS COMPARISON:  Right hip radiographs 02/05/2022 FINDINGS: Sequelae of right hip arthroplasty are again noted. A mildly displaced, comminuted fracture of the proximal subtrochanteric/intertrochanteric region around the stem of the femoral prosthesis is unchanged from yesterday's study. Previous plate and screw fixation of a remote, healed fracture of the more distal femur is noted. The hip and knee are located. There is chondrocalcinosis in the medial and lateral compartments of the knee. Extensive atherosclerotic vascular calcifications are noted. IMPRESSION: Unchanged periprosthetic fracture of the proximal subtrochanteric/intertrochanteric right femur. Electronically Signed   By: Logan Bores M.D.   On: 02/06/2022 10:36    Positive ROS: All other systems have been reviewed and were otherwise negative with the exception of those mentioned in the HPI and as above.  Objective: Labs cbc Recent Labs    02/05/22 2050  WBC 13.3*  HGB 11.8*  HCT 36.0  PLT 227    Labs inflam No results for input(s): "CRP" in the last 72 hours.  Invalid input(s): "ESR"  Labs coag Recent Labs    02/05/22 2050  INR 1.2    Recent Labs    02/05/22 2050  NA 138  K 4.3  CL 100  CO2 24  GLUCOSE 136*  BUN 13  CREATININE 1.03*  CALCIUM 9.6    Physical Exam: Vitals:   02/05/22 2205 02/06/22 0810  BP: 122/74 126/80  Pulse: 75 72  Resp: 18   Temp: 97.8 F (36.6 C)   SpO2: 91% 94%   General: Alert, no acute distress. Eating lunch, very conversant, sometimes loses train of thought and seems scatter brained. HOH Mental status: Alert and  Oriented x3 Neurologic: Speech Clear, no gross focal findings. Neuropathy at baseline  Respiratory: No cyanosis, no use of accessory musculature Cardiovascular: No pedal edema  GI: Abdomen is soft and non-tender, non-distended. Skin: Warm and dry.   Extremities: Warm and well perfused w/o edema Psychiatric: Patient is competent for consent with normal mood and affect  MUSCULOSKELETAL:  TTP right hip and thigh, non-TTP right knee and ankle, TTP right 1st and 2nd phalanx and lateral calcaneous, ecchymosis to those toes, ROM entire RLE limited, NVI  Other extremities are atraumatic with painless ROM and NVI.  Assessment / Plan: Principal Problem:   Closed right hip fracture [facility]) Active Problems:   Essential hypertension   Dyslipidemia   Hypertensive urgency   Tachy-brady syndrome (HCC)   Chronic a-fib (HCC)     Due to her age and multiple medical co-morbidities, we plan to try to treat this non-operatively. She will be toe touch weight bearing x 6 weeks and able to work with PT/OT on mobilization. Will likely need to go to a SNF.   If she fails non-operative treatment, she would require a very large and extensive surgery that may not be in her best interest as she might not survive it.   Due to the pain and bruising on her right foot, I will order a xray to check for injuries from when her foot dragged on the ground causing her to fall.   Weightbearing: TDWB RLE Insicional and dressing care: n/a Orthopedic device(s): None Showering: ok VTE prophylaxis:  on daily Eliquis   Pain control: minimize narcotics; I would discontinue the Morphine and Oxy and stick with Tylenol, Tramadol and at most Vicodin. Follow - up plan: 2 weeks Contact information:  Edmonia Lynch MD, [facility] PA-C   Britt Bottom PA-C Office [phone] 02/06/2022 1:02 PM

## 2022-02-06 NOTE — Plan of Care (Signed)
  Problem: Pain Managment: Goal: General experience of comfort will improve Outcome: Progressing   Problem: Safety: Goal: Ability to remain free from injury will improve Outcome: Progressing   Problem: Skin Integrity: Goal: Risk for impaired skin integrity will decrease Outcome: Progressing   

## 2022-02-07 DIAGNOSIS — S72001A Fracture of unspecified part of neck of right femur, initial encounter for closed fracture: Secondary | ICD-10-CM | POA: Diagnosis not present

## 2022-02-07 DIAGNOSIS — I10 Essential (primary) hypertension: Secondary | ICD-10-CM | POA: Diagnosis not present
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
## 2022-02-07 NOTE — Evaluation (Signed)
Physical Therapy Evaluation  Patient Details Name: Karina [name] MRN: [id number] DOB: [birth year] Today's Date: 02/07/2022  History of Present Illness  88 y/o female who presents on 02/05/22 after mechanical fall, sustaining a R hip periprosthetic fracture. Orthopedics was consulted and recommend to try and treat non-operatively. She will be TTWB on the RLE likely 6 weeks. PMH significant for CAD, a-fib, chronic edema, DDD, fibromuscular dysplasia, R hip fx s/p fixation 2008, HTN, OA, osteoporosis, pacemaker, peripheral neuropathy, PVD, tachy/brady syndrome, trigeminal neuralgia R side of face.   Clinical Impression  Pt admitted with above diagnosis. Pt currently with functional limitations due to the deficits listed below (see PT Problem List). PTA pt was independent with the rollator and managing well at home. At the time of PT eval pt was able to demonstrate transfer to EOB and attempt standing with up to +2 max-total assist. Pt with an excellent rehab effort, however pain and weakness limiting progression of mobility at this time. After stand at EOB, pt became nauseated and vomited. RN present at end of session to provide nausea meds. Recommend SNF level rehab to maximize functional independence and safety prior to return home. Acutely, pt will benefit from skilled PT to increase their independence and safety with mobility to allow discharge to the venue listed below.          Recommendations for follow up therapy are one component of a multi-disciplinary discharge planning process, led by the attending physician.  Recommendations may be updated based on patient status, additional functional criteria and insurance authorization.  Follow Up Recommendations Skilled nursing-short term rehab (<3 hours/day) Can patient physically be transported by private vehicle: No    Assistance Recommended at Discharge Frequent or constant Supervision/Assistance  Patient can return home with the following  Two  people to help with walking and/or transfers;A lot of help with bathing/dressing/bathroom;Assistance with cooking/housework;Assist for transportation;Help with stairs or ramp for entrance    Equipment Recommendations Rolling walker (2 wheels)  Recommendations for Other Services       Functional Status Assessment Patient has had a recent decline in their functional status and demonstrates the ability to make significant improvements in function in a reasonable and predictable amount of time.     Precautions / Restrictions Precautions Precautions: Fall Restrictions Weight Bearing Restrictions: Yes RLE Weight Bearing: Touchdown weight bearing (MD note specified toe touch)      Mobility  Bed Mobility Overal bed mobility: Needs Assistance Bed Mobility: Supine to Sit, Sit to Supine     Supine to sit: Max assist, +2 for physical assistance, HOB elevated Sit to supine: Total assist, +2 for physical assistance   General bed mobility comments: +2 max assist and use of bed pad to advance LE's and hips around to EOB. At end of session +2 total assist utilzied for transition back to bed.    Transfers Overall transfer level: Needs assistance Equipment used: Rolling walker (2 wheels) Transfers: Sit to/from Stand Sit to Stand: Max assist, +2 physical assistance, From elevated surface           General transfer comment: Max A for power up and second person holding RLE for pain management and adhering to precautions. Pt able to initiate and demonstrate an anterior lean, but unable to tolerate standing unsupported or with less than mod assist, and pt was returned to sitting EOB.    Ambulation/Gait               General Gait Details: Unable to progress to ambulation  at this time.  Stairs            Wheelchair Mobility    Modified Rankin (Stroke Patients Only)       Balance Overall balance assessment: Needs assistance Sitting-balance support: No upper extremity  supported, Feet supported Sitting balance-Leahy Scale: Fair     Standing balance support: Bilateral upper extremity supported, During functional activity Standing balance-Leahy Scale: Zero                               Pertinent Vitals/Pain Pain Assessment Pain Assessment: 0-10 Pain Score: 7  Pain Location: RLE Pain Descriptors / Indicators: Constant, Discomfort, Guarding, Grimacing Pain Intervention(s): Limited activity within patient's tolerance, Monitored during session, Repositioned    Home Living Family/patient expects to be discharged to:: Skilled nursing facility Living Arrangements: Alone Available Help at Discharge: Personal care attendant Type of Home: House Home Access: Stairs to enter Entrance Stairs-Rails: Right Entrance Stairs-Number of Steps: 2 Alternate Level Stairs-Number of Steps: uses stair lift to get to 2nd floor Home Layout: Multi-level Home Equipment: Conservation officer, nature (2 wheels);Cane - single point;Grab bars - toilet;Grab bars - tub/shower;Rollator (4 wheels) Additional Comments: AFO for LEs    Prior Function Prior Level of Function : Needs assist             Mobility Comments: uses a rollator ADLs Comments: Performs ADLs. Has a cook. and says she is looking for a Hospital doctor".     Hand Dominance   Dominant Hand: Right    Extremity/Trunk Assessment   Upper Extremity Assessment Upper Extremity Assessment: Defer to OT evaluation    Lower Extremity Assessment Lower Extremity Assessment: RLE deficits/detail RLE Deficits / Details: Acute pain, decreased strength and AROM consistent with above mentioned injury. RLE: Unable to fully assess due to pain    Cervical / Trunk Assessment Cervical / Trunk Assessment: Kyphotic  Communication   Communication: HOH  Cognition Arousal/Alertness: Awake/alert Behavior During Therapy: WFL for tasks assessed/performed Overall Cognitive Status: Within Functional Limits for tasks assessed                                  General Comments: Baseline memory for age. Reports she feels a little groggy from pain meds. Overall, [facility]        General Comments      Exercises     Assessment/Plan    PT Assessment Patient needs continued PT services  PT Problem List Decreased strength;Decreased range of motion;Decreased activity tolerance;Decreased balance;Decreased mobility;Decreased knowledge of use of DME;Decreased safety awareness;Decreased knowledge of precautions;Pain       PT Treatment Interventions DME instruction;Gait training;Functional mobility training;Therapeutic activities;Stair training;Therapeutic exercise;Balance training;Neuromuscular re-education;Patient/family education    PT Goals (Current goals can be found in the Care Plan section)  Acute Rehab PT Goals Patient Stated Goal: Return to her home PT Goal Formulation: With patient Time For Goal Achievement: 02/21/22 Potential to Achieve Goals: Good    Frequency Min 3X/week     Co-evaluation               AM-PAC PT "6 Clicks" Mobility  Outcome Measure Help needed turning from your back to your side while in a flat bed without using bedrails?: Total Help needed moving from lying on your back to sitting on the side of a flat bed without using bedrails?: Total Help needed moving to and from a  bed to a chair (including a wheelchair)?: Total Help needed standing up from a chair using your arms (e.g., wheelchair or bedside chair)?: Total Help needed to walk in hospital room?: Total Help needed climbing 3-5 steps with a railing? : Total 6 Click Score: 6    End of Session Equipment Utilized During Treatment: Gait belt Activity Tolerance: Patient limited by pain Patient left: in bed;with call bell/phone within reach;with bed alarm set Nurse Communication: Mobility status PT Visit Diagnosis: Unsteadiness on feet (R26.81);Pain Pain - Right/Left: Right Pain - part of body:  (hip/thigh)     Time: 1030-1108 PT Time Calculation (min) (ACUTE ONLY): 38 min   Charges:   PT Evaluation $PT Eval Moderate Complexity: 1 Mod          Rolinda Roan, PT, DPT Acute Rehabilitation Services Secure Chat Preferred Office: [phone]   Thelma Comp 02/07/2022, 12:58 PM

## 2022-02-07 NOTE — Progress Notes (Signed)
    Right foot xrays actually show fractures of the 4th and 5th metatarsal necks which is interesting as she isn't painful or bruised there. She has pain and bruising over the 1st and 2nd phalanx but no fractures seen there.  Will continue with TDWB RLE x 6 weeks. Can order post-op shoe for patient if needed. Continue to work with PT/OT.   Recommend elevation of foot and ice PRN.     Britt Bottom, PA-C Office [phone] 02/07/2022, 1:25 PM

## 2022-02-07 NOTE — Progress Notes (Signed)
[facility] PROGRESS NOTE    Progress Note  Karina [name]  ZOX:[id number] DOB: Aug 02, 1922 DOA: 02/05/2022 PCP: [name], MD     Brief Narrative:   Karina [name] is an 88 y.o. female past medical history significant for CAD, A-fib on Eliquis, essential hypertension, TIA comes in after fall where she tripped, x-ray of her right hip periprosthetic fracture, orthopedic surgery recommended transfer to [facility]   Assessment/Plan:   Acute periprosthetic closed right hip fracture: Orthopedic surgery evaluated the patient recommended not surgical management. Due to her age and multiple comorbidities. Touch weightbearing for 6 weeks. She will likely need skilled nursing facility. Continue narcotics and bowel regimen Zofran for nausea. She relates her pain is controlled.  Depression/insomnia: Continue Remeron, Celexa and Seroquel.  Essential hypertension/hypertensive urgency: Elevated blood pressure on admission was likely due to pain that improved. Continue metoprolol blood pressure at goal.  Dyslipidemia Noted  Chronic a-fib ([place]): Rate controlled, resume Eliquis postsurgical.  Decubitus ulcer stage I present on admission RN Pressure Injury Documentation: Pressure Injury 05/06/21 Buttocks Left;Medial Stage 1 -  Intact skin with non-blanchable redness of a localized area usually over a bony prominence. red, non-blanchable (Active)  05/06/21 0000  Location: Buttocks  Location Orientation: Left;Medial  Staging: Stage 1 -  Intact skin with non-blanchable redness of a localized area usually over a bony prominence.  Wound Description (Comments): red, non-blanchable  Present on Admission: Yes    Estimated body mass index is 21.39 kg/m as calculated from the following:   Height as of 01/11/22: 5' 1.5" (1.562 m).   Weight as of 01/11/22: 52.2 kg.   DVT prophylaxis: lovenox Family Communication:none Status is: Observation The patient will require care spanning >  2 midnights and should be moved to inpatient because: Acute periprostatic right hip fracture    Code Status:     Code Status Orders  (From admission, onward)           Start     Ordered   02/05/22 2352  Do not attempt resuscitation (DNR)  Continuous       Question Answer Comment  In the event of cardiac or respiratory ARREST Do not call a "code blue"   In the event of cardiac or respiratory ARREST Do not perform Intubation, CPR, defibrillation or ACLS   In the event of cardiac or respiratory ARREST Use medication by any route, position, wound care, and other measures to relive pain and suffering. May use oxygen, suction and manual treatment of airway obstruction as needed for comfort.      02/05/22 2351           Code Status History     Date Active Date Inactive Code Status Order ID Comments User Context   02/05/2022 2017 02/05/2022 2351 Full Code [id number]  Emilee Hero, MD Inpatient   11/03/2021 0114 11/05/2021 1816 DNR [id number]  Truett Mainland, DO Inpatient   05/05/2021 1910 05/08/2021 1953 DNR [id number]  Rise Patience, MD ED   05/05/2021 1654 05/05/2021 1910 Full Code [id number]  Rise Patience, MD ED   02/23/2017 1739 03/19/2017 1741 Full Code [id number]  Cathlyn Parsons, PA-C Inpatient   02/23/2017 1739 02/23/2017 1739 Full Code [id number]  Cathlyn Parsons, PA-C Inpatient   02/15/2017 0004 02/23/2017 1726 Full Code [id number]  Bonnita Hollow, MD ED   01/18/2015 2135 01/20/2015 1637 Full Code [id number]  Waldemar Dickens, MD Inpatient   01/29/2014 1622 01/30/2014 1646 Full Code [id number]  Isaac Bliss, Rayford Halsted,  MD Inpatient         IV Access:   Peripheral IV   Procedures and diagnostic studies:   DG Foot Complete Right  Result Date: 02/06/2022 CLINICAL DATA:  Right foot pain after fall EXAM: RIGHT FOOT COMPLETE - 3+ VIEW COMPARISON:  None Available. FINDINGS: Diffuse osseous demineralization. There appear to be nondisplaced fractures  involving the fourth and fifth metatarsal necks. No additional fractures are seen. No dislocation. Overall mild degenerative changes of the foot. Extensive atherosclerotic vascular calcifications. No focal soft tissue swelling. IMPRESSION: Suspected nondisplaced fractures involving the fourth and fifth metatarsal necks. Correlate with point tenderness. Electronically Signed   By: Davina Poke D.O.   On: 02/06/2022 16:26   DG Pelvis 1-2 Views  Result Date: 02/06/2022 CLINICAL DATA:  Right hip pain. EXAM: PELVIS - 1-2 VIEW COMPARISON:  Pelvic/right hip radiographs 02/05/2022 FINDINGS: Sequelae of right hip arthroplasty are again identified. A mildly displaced, comminuted periprosthetic fracture of the proximal right femur involving the subtrochanteric and likely intertrochanteric regions is unchanged from yesterday's study. Prior plate and screw fixation of the more distal right femur and prior lumbar fusion are partially visualized. Extensive atherosclerotic vascular calcifications, an aortoiliac stent graft, and numerous pelvic and lower abdominal surgical clips are present. IMPRESSION: Unchanged periprosthetic fracture of the proximal right femur. Electronically Signed   By: Logan Bores M.D.   On: 02/06/2022 10:42   DG FEMUR, MIN 2 VIEWS RIGHT  Result Date: 02/06/2022 CLINICAL DATA:  Right hip pain. EXAM: RIGHT FEMUR 2 VIEWS COMPARISON:  Right hip radiographs 02/05/2022 FINDINGS: Sequelae of right hip arthroplasty are again noted. A mildly displaced, comminuted fracture of the proximal subtrochanteric/intertrochanteric region around the stem of the femoral prosthesis is unchanged from yesterday's study. Previous plate and screw fixation of a remote, healed fracture of the more distal femur is noted. The hip and knee are located. There is chondrocalcinosis in the medial and lateral compartments of the knee. Extensive atherosclerotic vascular calcifications are noted. IMPRESSION: Unchanged periprosthetic  fracture of the proximal subtrochanteric/intertrochanteric right femur. Electronically Signed   By: Logan Bores M.D.   On: 02/06/2022 10:36     Medical Consultants:   None.   Subjective:    Karina [name] has not had a bowel movement relates her pain is controlled.  Objective:    Vitals:   02/06/22 0847 02/06/22 1811 02/06/22 2210 02/07/22 0752  BP:  (!) 143/78 131/84 (!) 171/93  Pulse:  83 88 84  Resp: '17 16 16 18  '$ Temp:  98.5 F (36.9 C) 98.5 F (36.9 C) 98.5 F (36.9 C)  TempSrc:  Oral Oral Oral  SpO2:  91% 95% 96%   SpO2: 96 % O2 Flow Rate (L/min): 0 L/min FiO2 (%): (!) 0 %   Intake/Output Summary (Last 24 hours) at 02/07/2022 0837 Last data filed at 02/06/2022 1800 Gross per 24 hour  Intake 360 ml  Output 300 ml  Net 60 ml   There were no vitals filed for this visit.  Exam: General exam: In no acute distress. Respiratory system: Good air movement and clear to auscultation. Cardiovascular system: S1 & S2 heard, RRR. No JVD. Gastrointestinal system: Abdomen is nondistended, soft and nontender.  Extremities: No pedal edema. Skin: No rashes, lesions or ulcers  Data Reviewed:    Labs: Basic Metabolic Panel: Recent Labs  Lab 02/05/22 2050  NA 138  K 4.3  CL 100  CO2 24  GLUCOSE 136*  BUN 13  CREATININE 1.03*  CALCIUM 9.6  MG 1.7    GFR CrCl cannot be calculated (Unknown ideal weight.). Liver Function Tests: Recent Labs  Lab 02/05/22 2050  AST 22  ALT 15  ALKPHOS 69  BILITOT 1.1  PROT 6.0*  ALBUMIN 3.4*    No results for input(s): "LIPASE", "AMYLASE" in the last 168 hours. No results for input(s): "AMMONIA" in the last 168 hours. Coagulation profile Recent Labs  Lab 02/05/22 2050  INR 1.2    COVID-19 Labs  No results for input(s): "DDIMER", "FERRITIN", "LDH", "CRP" in the last 72 hours.  Lab Results  Component Value Date   SARSCOV2NAA POSITIVE (A) 01/11/2022   SARSCOV2NAA POSITIVE (A) 05/03/2021   [place] Not  Detected 04/06/2019   [place] Not Detected 03/22/2019    CBC: Recent Labs  Lab 02/05/22 2050  WBC 13.3*  HGB 11.8*  HCT 36.0  MCV 91.4  PLT 227    Cardiac Enzymes: No results for input(s): "CKTOTAL", "CKMB", "CKMBINDEX", "TROPONINI" in the last 168 hours. BNP (last 3 results) No results for input(s): "PROBNP" in the last 8760 hours. CBG: No results for input(s): "GLUCAP" in the last 168 hours. D-Dimer: No results for input(s): "DDIMER" in the last 72 hours. Hgb A1c: No results for input(s): "HGBA1C" in the last 72 hours. Lipid Profile: No results for input(s): "CHOL", "HDL", "LDLCALC", "TRIG", "CHOLHDL", "LDLDIRECT" in the last 72 hours. Thyroid function studies: No results for input(s): "TSH", "T4TOTAL", "T3FREE", "THYROIDAB" in the last 72 hours.  Invalid input(s): "FREET3" Anemia work up: No results for input(s): "VITAMINB12", "FOLATE", "FERRITIN", "TIBC", "IRON", "RETICCTPCT" in the last 72 hours. Sepsis Labs: Recent Labs  Lab 02/05/22 2050  WBC 13.3*    Microbiology No results found for this or any previous visit (from the past 240 hour(s)).   Medications:    citalopram  5 mg Oral q morning   metoprolol succinate  25 mg Oral BID   mirtazapine  15 mg Oral QHS   polyethylene glycol  17 g Oral BID   QUEtiapine  25 mg Oral QHS   Continuous Infusions:    LOS: 2 days   Charlynne Cousins  [facility]  02/07/2022, 8:37 AM
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
## 2022-02-07 NOTE — Evaluation (Signed)
Occupational Therapy Evaluation Patient Details Name: Karina [name] MRN: [id number] DOB: [birth year] Today's Date: 02/07/2022   History of Present Illness 88 y/o female who presents on 02/05/22 after mechanical fall, sustaining a R hip periprosthetic fracture. Orthopedics was consulted and recommend to try and treat non-operatively. She will be TTWB on the RLE likely 6 weeks. PMH significant for CAD, a-fib, chronic edema, DDD, fibromuscular dysplasia, R hip fx s/p fixation 2008, HTN, OA, osteoporosis, pacemaker, peripheral neuropathy, PVD, tachy/brady syndrome, trigeminal neuralgia R side of face.   Clinical Impression   PTA, pt was living alone and performing ADLs; has someone who comes in and "sits with her". Pt currently requiring Min A for UB ADLs, Max A for LB ADLs, and Max A +2 for sit<>stand from EOB with RW. Pt with poor balance, strength, and activity tolerance due to pain. Pt would benefit from further acute OT to facilitate safe dc. Recommend dc to SNF for further OT to optimize safety, independence with ADLs, and return to PLOF.      Recommendations for follow up therapy are one component of a multi-disciplinary discharge planning process, led by the attending physician.  Recommendations may be updated based on patient status, additional functional criteria and insurance authorization.   Follow Up Recommendations  Skilled nursing-short term rehab (<3 hours/day)    Assistance Recommended at Discharge Frequent or constant Supervision/Assistance  Patient can return home with the following Two people to help with walking and/or transfers;Two people to help with bathing/dressing/bathroom    Functional Status Assessment  Patient has had a recent decline in their functional status and demonstrates the ability to make significant improvements in function in a reasonable and predictable amount of time.  Equipment Recommendations  Other (comment) (defer to next venue)    Recommendations  for Other Services PT consult     Precautions / Restrictions Precautions Precautions: Fall Restrictions Weight Bearing Restrictions: Yes RLE Weight Bearing: Touchdown weight bearing (MD note specified toe touch)      Mobility Bed Mobility Overal bed mobility: Needs Assistance Bed Mobility: Supine to Sit, Sit to Supine     Supine to sit: Total assist Sit to supine: Total assist   General bed mobility comments: Total A +2 for bed mobility to manage trunk and LEs. Use of bed pad for maanging hips    Transfers Overall transfer level: Needs assistance Equipment used: Rolling walker (2 wheels) Transfers: Sit to/from Stand Sit to Stand: Max assist, +2 physical assistance, From elevated surface           General transfer comment: Max A for power up and second person holding RLE for pain amangement and adhering to precautions      Balance Overall balance assessment: Needs assistance Sitting-balance support: No upper extremity supported, Feet supported Sitting balance-Leahy Scale: Fair     Standing balance support: Bilateral upper extremity supported, During functional activity Standing balance-Leahy Scale: Zero                             ADL either performed or assessed with clinical judgement   ADL Overall ADL's : Needs assistance/impaired Eating/Feeding: Set up;Sitting   Grooming: Oral care;Wash/dry face;Set up;Sitting   Upper Body Bathing: Minimal assistance;Sitting   Lower Body Bathing: Maximal assistance;Sit to/from stand;Bed level   Upper Body Dressing : Minimal assistance;Sitting   Lower Body Dressing: Maximal assistance;Sit to/from stand               Functional  mobility during ADLs: Maximal assistance (sit<>stand only) General ADL Comments: Pt with decreased acitvity tolerace strength, and balance     Vision         Perception     Praxis      Pertinent Vitals/Pain Pain Assessment Pain Assessment: 0-10 Pain Score: 7  Pain  Location: RLR Pain Descriptors / Indicators: Constant, Discomfort, Guarding, Grimacing Pain Intervention(s): Repositioned, Monitored during session, Limited activity within patient's tolerance, Premedicated before session     Hand Dominance Right   Extremity/Trunk Assessment Upper Extremity Assessment Upper Extremity Assessment: Defer to OT evaluation   Lower Extremity Assessment Lower Extremity Assessment: RLE deficits/detail RLE Deficits / Details: Acute pain, decreased strength and AROM consistent with above mentioned injury. RLE: Unable to fully assess due to pain   Cervical / Trunk Assessment Cervical / Trunk Assessment: Kyphotic   Communication Communication Communication: HOH   Cognition Arousal/Alertness: Awake/alert Behavior During Therapy: WFL for tasks assessed/performed Overall Cognitive Status: Within Functional Limits for tasks assessed                                 General Comments: Baseline memory for age. Reports she feels a little groggy from pain meds. Overall, [facility]     General Comments       Exercises     Shoulder Instructions      Home Living Family/patient expects to be discharged to:: Skilled nursing facility Living Arrangements: Alone Available Help at Discharge: Personal care attendant Type of Home: House Home Access: Stairs to enter Entrance Stairs-Number of Steps: 2 Entrance Stairs-Rails: Right Home Layout: Multi-level Alternate Level Stairs-Number of Steps: uses stair lift to get to 2nd floor   Bathroom Shower/Tub: Occupational psychologist: Standard     Home Equipment: Conservation officer, nature (2 wheels);Cane - single point;Grab bars - toilet;Grab bars - tub/shower;Rollator (4 wheels)   Additional Comments: AFO for LEs      Prior Functioning/Environment Prior Level of Function : Needs assist             Mobility Comments: uses a rollator ADLs Comments: Performs ADLs. Has a cook. and says she is looking for a  Hospital doctor".        OT Problem List: Decreased strength;Decreased range of motion;Decreased activity tolerance;Impaired balance (sitting and/or standing);Decreased knowledge of use of DME or AE;Decreased knowledge of precautions;Pain      OT Treatment/Interventions: Self-care/ADL training;Therapeutic exercise;Energy conservation;DME and/or AE instruction;Therapeutic activities;Balance training;Patient/family education    OT Goals(Current goals can be found in the care plan section) Acute Rehab OT Goals Patient Stated Goal: Get stronger to go home OT Goal Formulation: With patient Time For Goal Achievement: 02/21/22 Potential to Achieve Goals: Good  OT Frequency: Min 2X/week    Co-evaluation PT/OT/SLP Co-Evaluation/Treatment: Yes Reason for Co-Treatment: For patient/therapist safety;To address functional/ADL transfers   OT goals addressed during session: ADL's and self-care      AM-PAC OT "6 Clicks" Daily Activity     Outcome Measure Help from another person eating meals?: A Little Help from another person taking care of personal grooming?: A Little Help from another person toileting, which includes using toliet, bedpan, or urinal?: A Lot Help from another person bathing (including washing, rinsing, drying)?: A Lot Help from another person to put on and taking off regular upper body clothing?: A Little Help from another person to put on and taking off regular lower body clothing?: A Lot 6 Click  Score: 15   End of Session Equipment Utilized During Treatment: Gait belt;Rolling walker (2 wheels) Nurse Communication: Mobility status  Activity Tolerance: Patient tolerated treatment well Patient left: in bed;with call bell/phone within reach;with bed alarm set  OT Visit Diagnosis: Unsteadiness on feet (R26.81);Other abnormalities of gait and mobility (R26.89);Muscle weakness (generalized) (M62.81)                Time: 5364-6803 OT Time Calculation (min): 37 min Charges:  OT  General Charges $OT Visit: 1 Visit OT Evaluation $OT Eval Moderate Complexity: 1 Mod  Orvetta Danielski MSOT, OTR/L Acute Rehab Office: [place] 02/07/2022, 1:12 PM

## 2022-02-08 ENCOUNTER — Inpatient Hospital Stay (HOSPITAL_COMMUNITY): Payer: Medicare Other

## 2022-02-08 DIAGNOSIS — R112 Nausea with vomiting, unspecified: Secondary | ICD-10-CM

## 2022-02-08 LAB — GLUCOSE, CAPILLARY: Glucose-Capillary: 226 mg/dL — ABNORMAL HIGH (ref 70–99)

## 2022-02-08 MED ORDER — APIXABAN 2.5 MG PO TABS
2.5000 mg | ORAL_TABLET | Freq: Two times a day (BID) | ORAL | Status: DC
  Administered 2022-02-08 – 2022-02-12 (×8): 2.5 mg via ORAL
  Filled 2022-02-08 (×9): qty 1

## 2022-02-08 MED ORDER — HYDRALAZINE HCL 10 MG PO TABS: 10.0000 mg | ORAL_TABLET | Freq: Four times a day (QID) | ORAL | Status: DC | PRN

## 2022-02-08 MED ORDER — SORBITOL 70 % SOLN
200.0000 mL | TOPICAL_OIL | Freq: Once | ORAL | Status: AC
  Administered 2022-02-08: 200 mL via RECTAL
  Filled 2022-02-08: qty 60

## 2022-02-08 MED ORDER — LABETALOL HCL 5 MG/ML IV SOLN
5.0000 mg | INTRAVENOUS | Status: DC | PRN
  Administered 2022-02-08 (×4): 5 mg via INTRAVENOUS
  Filled 2022-02-08 (×3): qty 4

## 2022-02-08 MED ORDER — SALINE SPRAY 0.65 % NA SOLN
1.0000 | NASAL | Status: DC | PRN
  Filled 2022-02-08: qty 44

## 2022-02-08 MED ORDER — HYDROCODONE-ACETAMINOPHEN 5-325 MG PO TABS: 1.0000 | ORAL_TABLET | ORAL | Status: DC | PRN

## 2022-02-08 MED ORDER — METOCLOPRAMIDE HCL 5 MG/ML IJ SOLN
5.0000 mg | Freq: Three times a day (TID) | INTRAMUSCULAR | Status: AC
  Administered 2022-02-08 (×2): 5 mg via INTRAVENOUS
  Filled 2022-02-08 (×2): qty 2

## 2022-02-08 NOTE — Progress Notes (Signed)
PT Cancellation Note  Patient Details Name: Karina [name] MRN: [id number] DOB: 02-13-1923   Cancelled Treatment:    Reason Eval/Treat Not Completed: (P) Patient not medically ready (in AM RN defer due to pt needing enema, in PM pt with elevated diastolic BP (700) so defer therapy session until pt more medically stable.) Will continue efforts per PT plan of care as schedule permits once medically appropriate.   [name] Kamil Mchaffie 02/08/2022, 2:53 PM

## 2022-02-08 NOTE — Progress Notes (Signed)
Patient's BP elevated, and she is cool and clammy to the touch. Blood sugar taken, 226. Notified MD. New orders received.

## 2022-02-08 NOTE — Progress Notes (Addendum)
     Subjective:  In bed, asking for water, states she's been spitting up all night. Denies abdominal pain. No pain at hip or right foot at rest. Per nursing, does indicate a fair amount of pain at right hip when being moved to change bed linens. Denies numbness or tingling. No other complaints.   Objective:  PE: VITALS:   Vitals:   02/07/22 0752 02/07/22 1500 02/07/22 1947 02/08/22 0606  BP: (!) 171/93 (!) 166/102 (!) 192/98 (!) 118/43  Pulse: 84 95 91 (!) 103  Resp: '18 16 16 18  '$ Temp: 98.5 F (36.9 C) 98.7 F (37.1 C) 98.8 F (37.1 C) 98.7 F (37.1 C)  TempSrc: Oral Oral    SpO2: 96% 93% 95% 94%   MSK: TTP to right hip, no ecchymosis. No TTP to right foot. Able to flex and extend toes. Not moving right ankle, but unsure if this is limited due to lack of understanding of commands due to hearing loss. Does endorse distal sensation as intact. 2+ DP pulse.  LABS  No results found for this or any previous visit (from the past 24 hour(s)).  DG Foot Complete Right  Result Date: 02/06/2022 CLINICAL DATA:  Right foot pain after fall EXAM: RIGHT FOOT COMPLETE - 3+ VIEW COMPARISON:  None Available. FINDINGS: Diffuse osseous demineralization. There appear to be nondisplaced fractures involving the fourth and fifth metatarsal necks. No additional fractures are seen. No dislocation. Overall mild degenerative changes of the foot. Extensive atherosclerotic vascular calcifications. No focal soft tissue swelling. IMPRESSION: Suspected nondisplaced fractures involving the fourth and fifth metatarsal necks. Correlate with point tenderness. Electronically Signed   By: Davina Poke D.O.   On: 02/06/2022 16:26    Assessment/Plan: Periprosthetic Hip Fracture - continue non-surgical management Weightbearing: TTWB RLEx 6 weeks, up with therapy as able.  Orthopedic device(s): Will order hard sole shoe for her to use on right foot when ambulating. DVT prophylacis - lovenox  Pain control: continue  current regimen Follow - up plan: 2 weeks with Dr. Percell Miller Dispo: ok to discharge to SNF from ortho perspective once bed available  Contact information:   Merlene Pulling, PA-C Weekdays 8-5  After hours and holidays please check [email or web address] for group call information for [facility]  Ventura Bruns 02/08/2022, 10:00 AM

## 2022-02-08 NOTE — Progress Notes (Addendum)
[facility] PROGRESS NOTE    Progress Note  Karina [name]  ZSW:[id number] DOB: [birth year] DOA: 02/05/2022 PCP: [name], MD     Brief Narrative:   Karina [name] is an 88 y.o. female past medical history significant for CAD, A-fib on Eliquis, essential hypertension, TIA comes in after fall where she tripped, x-ray of her right hip periprosthetic fracture, orthopedic surgery recommended transfer to [facility]. Orthopedic surgery recommended conservative management. Patient is medically stable to go to skilled nursing facility   Assessment/Plan:   Acute periprosthetic closed right hip fracture: Orthopedic surgery evaluated the patient recommended not surgical management. Due to her age and multiple comorbidities. Touch weightbearing for 6 weeks. PT/OT evaluated the patient, she will need skilled nursing facility Continue narcotics and bowel regimen Zofran for nausea. She relates her pain is controlled.  But she was vomiting this morning.  Depression/insomnia: Continue Remeron, Celexa and Seroquel.  Essential hypertension/hypertensive urgency: Elevated blood pressure on admission was likely due to pain that improved. Continue metoprolol blood pressure at goal. Use hydralazine IV as needed is elevated again this morning.  But it was well controlled overnight.  Dyslipidemia Noted  Chronic a-fib ([place]): Rate controlled, resume Eliquis postsurgical.  Nausea and vomiting: Likely due to narcotics, she was already on Zofran she relates she is nauseated.   Decubitus ulcer stage I present on admission RN Pressure Injury Documentation:     Estimated body mass index is 21.39 kg/m as calculated from the following:   Height as of 01/11/22: 5' 1.5" (1.562 m).   Weight as of 01/11/22: 52.2 kg.   DVT prophylaxis: lovenox Family Communication:none Status is: Observation The patient will require care spanning > 2 midnights and should be moved to inpatient because:  Acute periprostatic right hip fracture    Code Status:     Code Status Orders  (From admission, onward)           Start     Ordered   02/05/22 2352  Do not attempt resuscitation (DNR)  Continuous       Question Answer Comment  In the event of cardiac or respiratory ARREST Do not call a "code blue"   In the event of cardiac or respiratory ARREST Do not perform Intubation, CPR, defibrillation or ACLS   In the event of cardiac or respiratory ARREST Use medication by any route, position, wound care, and other measures to relive pain and suffering. May use oxygen, suction and manual treatment of airway obstruction as needed for comfort.      02/05/22 2351           Code Status History     Date Active Date Inactive Code Status Order ID Comments User Context   02/05/2022 2017 02/05/2022 2351 Full Code [id number]  Emilee Hero, MD Inpatient   11/03/2021 0114 11/05/2021 1816 DNR [id number]  Truett Mainland, DO Inpatient   05/05/2021 1910 05/08/2021 1953 DNR [id number]  Rise Patience, MD ED   05/05/2021 1654 05/05/2021 1910 Full Code [id number]  Rise Patience, MD ED   02/23/2017 1739 03/19/2017 1741 Full Code [id number]  Cathlyn Parsons, PA-C Inpatient   02/23/2017 1739 02/23/2017 1739 Full Code [id number]  Cathlyn Parsons, PA-C Inpatient   02/15/2017 0004 02/23/2017 1726 Full Code [id number]  Bonnita Hollow, MD ED   01/18/2015 2135 01/20/2015 1637 Full Code [id number]  Waldemar Dickens, MD Inpatient   01/29/2014 1622 01/30/2014 1646 Full Code [id number]  Isaac Bliss, Rayford Halsted, MD Inpatient  IV Access:   Peripheral IV   Procedures and diagnostic studies:   DG Foot Complete Right  Result Date: 02/06/2022 CLINICAL DATA:  Right foot pain after fall EXAM: RIGHT FOOT COMPLETE - 3+ VIEW COMPARISON:  None Available. FINDINGS: Diffuse osseous demineralization. There appear to be nondisplaced fractures involving the fourth and fifth metatarsal necks. No  additional fractures are seen. No dislocation. Overall mild degenerative changes of the foot. Extensive atherosclerotic vascular calcifications. No focal soft tissue swelling. IMPRESSION: Suspected nondisplaced fractures involving the fourth and fifth metatarsal necks. Correlate with point tenderness. Electronically Signed   By: Davina Poke D.O.   On: 02/06/2022 16:26   DG Pelvis 1-2 Views  Result Date: 02/06/2022 CLINICAL DATA:  Right hip pain. EXAM: PELVIS - 1-2 VIEW COMPARISON:  Pelvic/right hip radiographs 02/05/2022 FINDINGS: Sequelae of right hip arthroplasty are again identified. A mildly displaced, comminuted periprosthetic fracture of the proximal right femur involving the subtrochanteric and likely intertrochanteric regions is unchanged from yesterday's study. Prior plate and screw fixation of the more distal right femur and prior lumbar fusion are partially visualized. Extensive atherosclerotic vascular calcifications, an aortoiliac stent graft, and numerous pelvic and lower abdominal surgical clips are present. IMPRESSION: Unchanged periprosthetic fracture of the proximal right femur. Electronically Signed   By: Logan Bores M.D.   On: 02/06/2022 10:42   DG FEMUR, MIN 2 VIEWS RIGHT  Result Date: 02/06/2022 CLINICAL DATA:  Right hip pain. EXAM: RIGHT FEMUR 2 VIEWS COMPARISON:  Right hip radiographs 02/05/2022 FINDINGS: Sequelae of right hip arthroplasty are again noted. A mildly displaced, comminuted fracture of the proximal subtrochanteric/intertrochanteric region around the stem of the femoral prosthesis is unchanged from yesterday's study. Previous plate and screw fixation of a remote, healed fracture of the more distal femur is noted. The hip and knee are located. There is chondrocalcinosis in the medial and lateral compartments of the knee. Extensive atherosclerotic vascular calcifications are noted. IMPRESSION: Unchanged periprosthetic fracture of the proximal  subtrochanteric/intertrochanteric right femur. Electronically Signed   By: Logan Bores M.D.   On: 02/06/2022 10:36     Medical Consultants:   None.   Subjective:    Karina [name] relates her pain is controlled not had a bowel movement with vomiting this morning.  Objective:    Vitals:   02/07/22 0752 02/07/22 1500 02/07/22 1947 02/08/22 0606  BP: (!) 171/93 (!) 166/102 (!) 192/98 (!) 118/43  Pulse: 84 95 91 (!) 103  Resp: '18 16 16 18  '$ Temp: 98.5 F (36.9 C) 98.7 F (37.1 C) 98.8 F (37.1 C) 98.7 F (37.1 C)  TempSrc: Oral Oral    SpO2: 96% 93% 95% 94%   SpO2: 94 % O2 Flow Rate (L/min): 0 L/min FiO2 (%): (!) 0 %   Intake/Output Summary (Last 24 hours) at 02/08/2022 0809 Last data filed at 02/08/2022 1017 Gross per 24 hour  Intake 480 ml  Output 1150 ml  Net -670 ml    There were no vitals filed for this visit.  Exam: General exam: In no acute distress, very hard of hearing Respiratory system: Good air movement and clear to auscultation. Cardiovascular system: S1 & S2 heard, RRR. No JVD. Gastrointestinal system: Abdomen is nondistended, soft and nontender.  Extremities: No pedal edema. Skin: No rashes, lesions or ulcers  Data Reviewed:    Labs: Basic Metabolic Panel: Recent Labs  Lab 02/05/22 2050  NA 138  K 4.3  CL 100  CO2 24  GLUCOSE 136*  BUN 13  CREATININE 1.03*  CALCIUM 9.6  MG 1.7    GFR CrCl cannot be calculated (Unknown ideal weight.). Liver Function Tests: Recent Labs  Lab 02/05/22 2050  AST 22  ALT 15  ALKPHOS 69  BILITOT 1.1  PROT 6.0*  ALBUMIN 3.4*    No results for input(s): "LIPASE", "AMYLASE" in the last 168 hours. No results for input(s): "AMMONIA" in the last 168 hours. Coagulation profile Recent Labs  Lab 02/05/22 2050  INR 1.2    COVID-19 Labs  No results for input(s): "DDIMER", "FERRITIN", "LDH", "CRP" in the last 72 hours.  Lab Results  Component Value Date   SARSCOV2NAA POSITIVE (A) 01/11/2022    SARSCOV2NAA POSITIVE (A) 05/03/2021   [place] Not Detected 04/06/2019   [place] Not Detected 03/22/2019    CBC: Recent Labs  Lab 02/05/22 2050  WBC 13.3*  HGB 11.8*  HCT 36.0  MCV 91.4  PLT 227    Cardiac Enzymes: No results for input(s): "CKTOTAL", "CKMB", "CKMBINDEX", "TROPONINI" in the last 168 hours. BNP (last 3 results) No results for input(s): "PROBNP" in the last 8760 hours. CBG: No results for input(s): "GLUCAP" in the last 168 hours. D-Dimer: No results for input(s): "DDIMER" in the last 72 hours. Hgb A1c: No results for input(s): "HGBA1C" in the last 72 hours. Lipid Profile: No results for input(s): "CHOL", "HDL", "LDLCALC", "TRIG", "CHOLHDL", "LDLDIRECT" in the last 72 hours. Thyroid function studies: No results for input(s): "TSH", "T4TOTAL", "T3FREE", "THYROIDAB" in the last 72 hours.  Invalid input(s): "FREET3" Anemia work up: No results for input(s): "VITAMINB12", "FOLATE", "FERRITIN", "TIBC", "IRON", "RETICCTPCT" in the last 72 hours. Sepsis Labs: Recent Labs  Lab 02/05/22 2050  WBC 13.3*    Microbiology No results found for this or any previous visit (from the past 240 hour(s)).   Medications:    citalopram  5 mg Oral q morning   metoprolol succinate  25 mg Oral BID   mirtazapine  15 mg Oral QHS   polyethylene glycol  17 g Oral BID   QUEtiapine  25 mg Oral QHS   Continuous Infusions:    LOS: 3 days   Charlynne Cousins  [facility]  02/08/2022, 8:09 AM

## 2022-02-09 DIAGNOSIS — M25551 Pain in right hip: Secondary | ICD-10-CM | POA: Diagnosis not present

## 2022-02-09 DIAGNOSIS — Z515 Encounter for palliative care: Secondary | ICD-10-CM | POA: Diagnosis not present

## 2022-02-09 DIAGNOSIS — R638 Other symptoms and signs concerning food and fluid intake: Secondary | ICD-10-CM | POA: Diagnosis not present

## 2022-02-09 DIAGNOSIS — R5383 Other fatigue: Secondary | ICD-10-CM

## 2022-02-09 MED ORDER — PANTOPRAZOLE SODIUM 40 MG PO TBEC
40.0000 mg | DELAYED_RELEASE_TABLET | Freq: Two times a day (BID) | ORAL | Status: DC
  Administered 2022-02-09 – 2022-02-12 (×6): 40 mg via ORAL
  Filled 2022-02-09 (×7): qty 1

## 2022-02-09 MED ORDER — ACETAMINOPHEN 500 MG PO TABS: 1000.0000 mg | ORAL_TABLET | Freq: Four times a day (QID) | ORAL | Status: DC | PRN

## 2022-02-09 MED ORDER — METOCLOPRAMIDE HCL 5 MG/ML IJ SOLN
5.0000 mg | Freq: Three times a day (TID) | INTRAMUSCULAR | Status: AC
  Administered 2022-02-09 (×3): 5 mg via INTRAVENOUS
  Filled 2022-02-09 (×3): qty 2

## 2022-02-09 MED ORDER — ACETAMINOPHEN 650 MG RE SUPP: 650.0000 mg | Freq: Four times a day (QID) | RECTAL | Status: DC | PRN

## 2022-02-09 MED ORDER — SODIUM CHLORIDE 0.9 % IV SOLN: INTRAVENOUS | Status: AC

## 2022-02-09 NOTE — Progress Notes (Signed)
Ortho Trauma Progress Note  Subjective: Resting comfortably this morning. Pain currently well controlled. Does have some discomfort at the level of the hip when being rolled for the bedpan. Otherwise no specific complaints   Objective:  PE: VITALS:   Vitals:   02/08/22 1822 02/08/22 1958 02/08/22 2230 02/09/22 0030  BP: (!) 152/101 (!) 186/157 (!) 176/114 107/73  Pulse:  86    Resp:  17    Temp:  (!) 97.4 F (36.3 C)    TempSrc:  Oral    SpO2:  95%     Gen: Resting comfortably, NAD Resp: No increased WOB at rest RLE: Tenderness over right hip. No ecchymosis. No TTP to right foot. Able to flex and extend toes. Does endorse distal sensation as intact. 2+ DP pulse.  LABS  Results for orders placed or performed during the hospital encounter of 02/05/22 (from the past 24 hour(s))  Glucose, capillary     Status: Abnormal   Collection Time: 02/08/22  1:45 PM  Result Value Ref Range   Glucose-Capillary 226 (H) 70 - 99 mg/dL   Assessment/Plan: Periprosthetic Hip Fracture - continue non-surgical management Weightbearing: TDWB RLEx 6 weeks, up with therapy as able.  Orthopedic device(s):Hard sole shoe for her to use on right foot when ambulating. DVT prophylaxis: Eliquis Pain control: continue current regimen Follow - up plan: 2 weeks with Dr. Percell Miller Dispo: ok to discharge to SNF from ortho perspective once bed available  Contact information:   After hours and holidays please check [email or web address] for group call information for [facility] PA-C [facility] [phone] (office) [email or web address]

## 2022-02-09 NOTE — Progress Notes (Signed)
Patient's dinner arrived, Patient awake. Offered to help patient with meal. Patient took 3 bites and said she did not want any more. Patient did drink half of her milk. Will continue to monitor

## 2022-02-09 NOTE — Consult Note (Signed)
Consultation Note Date: 02/09/2022   Patient Name: Karina [name]  DOB: 01-Jan-1923  MRN: [id number]  Age / Sex: 88 y.o., female  PCP: [name], MD Referring Physician: Aileen Fass, Tammi Klippel, MD  Reason for Consultation: Establishing goals of care and Psychosocial/spiritual support  HPI/Patient Profile: 88 y.o. female  admitted on 02/05/2022 with  past medical history significant for CAD, A-fib on Eliquis, essential hypertension, TIA comes in after fall where she tripped, x-ray of her right hip periprosthetic fracture, orthopedic surgery recommended transfer to [facility].  Orthopedic surgery recommended conservative management.  Past few days patient has had pain with repositioning, and nausea.  Her oral intake has been poor.   Patient and family treatment option decisions, advanced directive decisions and anticipatory care needs.   Clinical Assessment and Goals of Care:  This NP Wadie Lessen reviewed medical records, received report from team, assessed the patient and then meet at the patient's bedside and spoke to her son by phone  to discuss diagnosis, prognosis, GOC, EOL wishes disposition and options.  Patient is too lethargic to engage in conversation on my visit   Discussion and education to son/Karina. Concept of Palliative Care was introduced as specialized medical care for people and their families living with serious illness.  If focuses on providing relief from the symptoms and stress of a serious illness.  The goal is to improve quality of life for both the patient and the family.   Values and goals of care important to patient and family were attempted to be elicited.  Created space and opportunity for family to explore thoughts and feelings regarding current medical situation.  Tells me that he has been updated by the physician that the plan is for her to go to a skilled facility for  continued medical management fracture and ongoing rehabilitation.  Education offered regarding patient's current high risk for decompensation secondary to new hip fracture and its sequela; limited mobility, poor p.o. intake, increased lethargy and pain.   A  discussion was had today regarding advanced directives.  Concepts specific to code status, artifical feeding and hydration, continued IV antibiotics and rehospitalization was had.    Education offered on concept of adult failure to thrive.  Questions and concerns addressed.  Patient  encouraged to call with questions or concerns.     PMT will continue to support holistically.    HCPOA/ son/Karina [name]    Patient's son tells me that he does have paperwork for H POA and living well, he will bring tomorrow for scanning into EMR    SUMMARY OF RECOMMENDATIONS    Code Status/Advance Care Planning: DNR   Symptom Management:  Pain : recommend high dose scheduled tylenol-attending to order  Palliative Prophylaxis:  Aspiration, Bowel Regimen, Delirium Protocol, Frequent Pain Assessment, and Oral Care   Psycho-social/Spiritual:   Additional Recommendations: Emotional support, plan is to meet tomorrow at 12:00 with son for ongoing discussion   Prognosis:  < 6 months  Discharge Planning: [place] for rehab with Palliative care service  follow-up      Primary Diagnoses: Present on Admission:  Closed right hip fracture [facility])  Essential hypertension  Dyslipidemia  Hypertensive urgency  Tachy-brady syndrome ([place])  Chronic a-fib ([place])   I have reviewed the medical record, interviewed the patient and family, and examined the patient. The following aspects are pertinent.  Past Medical History:  Diagnosis Date   Actinic keratosis    Bilateral leg weakness 09/27/12   CAD (coronary artery disease)    EF 55% by 2 D Echo 2008 and 2012   Chronic atrial fibrillation (HCC)    Chronic edema 10/15/11   Chronic  fatigue    DDD (degenerative disc disease)    Encounter for long-term (current) use of other medications 09/02/12   Endometrial carcinoma ([place])    Fibromuscular dysplasia ([place])    S/P PCI right renal aretery 1999   GERD (gastroesophageal reflux disease)    Hip fracture, right ([place]) 08/06/06   S/P surgery   Hx of cardiovascular stress test 09/2010   Nuclear stress testing showing no evidence of ischemia, EF=76%, No EKG changes & no perfusion defects.   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   LVH (left ventricular hypertrophy)    Mild   Osteoarthritis    Osteoporosis    With Hx of thoracic compression fractures   Pacemaker    Paroxysmal atrial fibrillation (HCC)    Peripheral neuropathy    Peripheral vascular disease ([place])    Pre-diabetes 01/21/11   Chronic   Renovascular hypertension    Tachy-brady syndrome (HCC)    Thoracic compression fracture (HCC)    TIA (transient ischemic attack) 08/2011   OSH: flushing, left LE numbness, CT negative   T[place] 1994   S/P successful surgery   Trigeminal neuralgia of right side of face    Social History   Socioeconomic History   Marital status: Widowed    Spouse name: Not on file   Number of children: Not on file   Years of education: Not on file   Highest education level: Not on file  Occupational History   Not on file  Tobacco Use   Smoking status: Never   Smokeless tobacco: Never  Vaping Use   Vaping Use: Never used  Substance and Sexual Activity   Alcohol use: Yes    Alcohol/week: 0.0 standard drinks of alcohol    Comment: Rarely   Drug use: No   Sexual activity: Not Currently  Other Topics Concern   Not on file  Social History Narrative   Not on file   Social Determinants of Health   Financial Resource Strain: Not on file  Food Insecurity: No Food Insecurity  (02/06/2022)   Hunger Vital Sign    Worried About Running Out of Food in the Last Year: Never true    Ran Out of Food in the Last Year: Never true  Transportation Needs: No Transportation Needs (02/06/2022)   PRAPARE - Hydrologist (Medical): No    Lack of Transportation (Non-Medical): No  Physical Activity: Not on file  Stress: Not on file  Social Connections: Not on file   Family History  Problem Relation Age of Onset   Stroke Father    COPD Brother    Diabetes Brother  DM   Hypertension Brother    Stroke Other    Scheduled Meds:  apixaban  2.5 mg Oral BID   citalopram  5 mg Oral q morning   metoCLOPramide (REGLAN) injection  5 mg Intravenous Q8H   metoprolol succinate  25 mg Oral BID   mirtazapine  15 mg Oral QHS   pantoprazole  40 mg Oral BID   QUEtiapine  25 mg Oral QHS   Continuous Infusions:  sodium chloride 75 mL/hr at 02/09/22 0850   PRN Meds:.acetaminophen **OR** acetaminophen, hydrALAZINE, HYDROcodone-acetaminophen, labetalol, morphine injection, ondansetron **OR** ondansetron (ZOFRAN) IV, senna-docusate, sodium chloride Medications Prior to Admission:  Prior to Admission medications   Medication Sig Start Date End Date Taking? Authorizing Provider  apixaban (ELIQUIS) 2.5 MG TABS tablet Take 1 tablet (2.5 mg total) 2 (two) times daily by mouth. 02/23/17  Yes Eloise Levels, MD  citalopram (CELEXA) 10 MG tablet Take 5 mg by mouth every morning. 04/17/21  Yes [provider]  cloNIDine (CATAPRES) 0.1 MG tablet Take 0.1 mg by mouth 3 (three) times daily. 05/07/09  Yes [provider]  diltiazem (TIAZAC) 120 MG 24 hr capsule Take 120 mg by mouth daily. 12/12/21  Yes [provider]  feeding supplement (ENSURE ENLIVE / ENSURE PLUS) LIQD Take 237 mLs by mouth 2 (two) times daily between meals. 11/05/21  Yes Tat, Shanon Brow, MD  furosemide (LASIX) 40 MG tablet Take 0.5 tablets (20 mg total) by mouth daily. 11/05/21  Yes  Tat, Shanon Brow, MD  metoprolol succinate (TOPROL-XL) 25 MG 24 hr tablet Take 25 mg by mouth 2 (two) times daily. 04/17/21  Yes [provider]  mirtazapine (REMERON SOL-TAB) 15 MG disintegrating tablet Take 15 mg by mouth every morning.   Yes [provider]  neomycin-polymyxin b-dexamethasone (MAXITROL) 3.5-10000-0.1 SUSP Place 2 drops into both eyes 3 (three) times daily. 01/20/22  Yes [provider]  nitroGLYCERIN (NITROSTAT) 0.4 MG SL tablet    Yes [provider]  rosuvastatin (CRESTOR) 5 MG tablet Take 1 tablet (5 mg total) by mouth daily. Patient taking differently: Take 5 mg by mouth every morning. 12/21/18  Yes Emokpae, Courage, MD  valsartan (DIOVAN) 80 MG tablet Take 80 mg by mouth every morning.   Yes [provider]  azithromycin (ZITHROMAX) 250 MG tablet Take by mouth. Patient not taking: Reported on 01/11/2022 01/01/22   [provider]  benzonatate (TESSALON) 200 MG capsule Take 200 mg by mouth daily as needed. Patient not taking: Reported on 01/11/2022 12/04/21   [provider]  pantoprazole (PROTONIX) 40 MG tablet Take 40 mg by mouth every morning. Patient not taking: Reported on 01/11/2022    [provider]   Allergies  Allergen Reactions   Ace Inhibitors Hives   Amiodarone Other (See Comments)    Worsening peripheral neuropathy   Tikosyn [Dofetilide] Other (See Comments)    QT prolongation   Meperidine And Related Other (See Comments)    Unknown reaction   Review of Systems  Unable to perform ROS: Mental status change    Physical Exam Constitutional:      Appearance: She is underweight. She is ill-appearing.  Cardiovascular:     Rate and Rhythm: Normal rate.  Pulmonary:     Effort: Pulmonary effort is normal.  Skin:    General: Skin is warm and dry.  Neurological:     Mental Status: She is lethargic.     Vital Signs: BP (!) 173/99 (BP Location: Right Arm)  Pulse 79   Temp 98.2 F (36.8  C) (Axillary)   Resp 18   SpO2 96%  Pain Scale: Faces POSS *See Group Information*: 1-Acceptable,Awake and alert Pain Score: 0-No pain   SpO2: SpO2: 96 % O2 Device:SpO2: 96 % O2 Flow Rate: .O2 Flow Rate (L/min): 0 L/min  IO: Intake/output summary:  Intake/Output Summary (Last 24 hours) at 02/09/2022 1009 Last data filed at 02/08/2022 1300 Gross per 24 hour  Intake 0 ml  Output --  Net 0 ml    LBM: Last BM Date : 02/08/22 Baseline Weight:   Most recent weight:       Palliative Assessment/Data: 30% at best   Discussed with Dr Venetia Constable    and bedside RN/Kelly  Time In: 1030 Time Out: 1145 Time Total: 75 minutes Greater than 50%  of this time was spent counseling and coordinating care related to the above assessment and plan.  Signed by: Wadie Lessen, NP   Please contact Palliative Medicine Team phone at [phone] for questions and concerns.  For individual provider: See Shea Evans

## 2022-02-09 NOTE — TOC Progression Note (Signed)
Transition of Care [facility]) - Progression Note    Patient Details  Name: Karina [name] MRN: [id number] Date of Birth: 1923/03/13  [facility] CM/SW Contact  Emeterio Reeve, [place] Phone Number: 02/09/2022, 1:48 PM  Clinical Narrative:     CSW spoke to pts son [name] on the phone. [name] states that [facility] and [facility] plan was to [facility] his mother [facility]. [facility]. CSW explained that we would have to wait until Monday to talk to admissions coordinator to see if she qualifies. CSW attempted to give other bed offers. [name] wanted to wait to talk to [facility] before hearing bed offers.   Expected Discharge Plan: [place] Barriers to Discharge: Continued Medical Work up, SNF Pending bed offer  Expected Discharge Plan and Services Expected Discharge Plan: [place] In-house Referral: Clinical Social Work   Post Acute Care Choice: [place] arrangements for the past 2 months: [place] Determinants of Health (SDOH) Interventions    Readmission Risk Interventions     No data to display         Emeterio Reeve, [place] Social Worker

## 2022-02-09 NOTE — Progress Notes (Signed)
[facility] PROGRESS NOTE    Progress Note  Karina [name]  XBM:[id number] DOB: 04-Jul-1922 DOA: 02/05/2022 PCP: [name], MD     Brief Narrative:   Karina [name] is an 88 y.o. female past medical history significant for CAD, A-fib on Eliquis, essential hypertension, TIA comes in after fall where she tripped, x-ray of her right hip periprosthetic fracture, orthopedic surgery recommended transfer to [facility]. Orthopedic surgery recommended conservative management. Patient is medically stable to go to skilled nursing facility   Assessment/Plan:   Acute periprosthetic closed right hip fracture: Orthopedic surgery evaluated the patient recommended not surgical management. Due to her age and multiple comorbidities. Touch weightbearing for 6 weeks. PT/OT evaluated the patient, she will need skilled nursing facility Continue narcotics and bowel regimen Zofran for nausea. out of bed to chair. Consult palliative care for end-of-life discussion.  Depression/insomnia: Continue Remeron, Celexa and Seroquel.  Essential hypertension/hypertensive urgency: Elevated blood pressure on admission was likely due to pain that improved. Continue metoprolol blood pressure at goal. Continue to use IV hydralazine and labetalol for elevated elevated blood pressure.  Dyslipidemia Noted  Chronic a-fib ([place]): Rate controlled, continue Eliquis.  Nausea and vomiting: 1 episode of vomiting overnight, continue Zofran and Reglan.  Probably due to narcotics. Started on Protonix twice daily. Abdominal x-ray showed nonobstructive bowel gas pattern with moderate stool in the right colon.   Decubitus ulcer stage I present on admission RN Pressure Injury Documentation:     Estimated body mass index is 21.39 kg/m as calculated from the following:   Height as of 01/11/22: 5' 1.5" (1.562 m).   Weight as of 01/11/22: 52.2 kg.   DVT prophylaxis: lovenox Family Communication:none Status  is: Observation The patient will require care spanning > 2 midnights and should be moved to inpatient because: Acute periprostatic right hip fracture    Code Status:     Code Status Orders  (From admission, onward)           Start     Ordered   02/05/22 2352  Do not attempt resuscitation (DNR)  Continuous       Question Answer Comment  In the event of cardiac or respiratory ARREST Do not call a "code blue"   In the event of cardiac or respiratory ARREST Do not perform Intubation, CPR, defibrillation or ACLS   In the event of cardiac or respiratory ARREST Use medication by any route, position, wound care, and other measures to relive pain and suffering. May use oxygen, suction and manual treatment of airway obstruction as needed for comfort.      02/05/22 2351           Code Status History     Date Active Date Inactive Code Status Order ID Comments User Context   02/05/2022 2017 02/05/2022 2351 Full Code [id number]  Emilee Hero, MD Inpatient   11/03/2021 0114 11/05/2021 1816 DNR [id number]  Truett Mainland, DO Inpatient   05/05/2021 1910 05/08/2021 1953 DNR [id number]  Rise Patience, MD ED   05/05/2021 1654 05/05/2021 1910 Full Code [id number]  Rise Patience, MD ED   02/23/2017 1739 03/19/2017 1741 Full Code [id number]  Cathlyn Parsons, PA-C Inpatient   02/23/2017 1739 02/23/2017 1739 Full Code [id number]  Cathlyn Parsons, PA-C Inpatient   02/15/2017 0004 02/23/2017 1726 Full Code [id number]  Bonnita Hollow, MD ED   01/18/2015 2135 01/20/2015 1637 Full Code [id number]  Waldemar Dickens, MD Inpatient   01/29/2014 1622 01/30/2014 1646 Full Code  [id number]  Isaac Bliss, Rayford Halsted, MD Inpatient         IV Access:   Peripheral IV   Procedures and diagnostic studies:   DG Abd 1 View  Result Date: 02/08/2022 CLINICAL DATA:  Abdominal pain. EXAM: ABDOMEN - 1 VIEW COMPARISON:  CT 11/02/2021 FINDINGS: No bowel dilatation to suggest obstruction. There is  moderate stool in the right colon with small volume of colonic stool distally. Prior aortic stent graft repair of aneurysm. Multiple pelvic surgical clips. Prior cholecystectomy. Rib costochondral cartilage calcification. Postsurgical change in the lumbar spine. IMPRESSION: Nonobstructive bowel gas pattern. Moderate stool in the right colon. Electronically Signed   By: Keith Rake M.D.   On: 02/08/2022 16:08     Medical Consultants:   None.   Subjective:    Karina [name] pain is controlled had a good night  Objective:    Vitals:   02/08/22 1822 02/08/22 1958 02/08/22 2230 02/09/22 0030  BP: (!) 152/101 (!) 186/157 (!) 176/114 107/73  Pulse:  86    Resp:  17    Temp:  (!) 97.4 F (36.3 C)    TempSrc:  Oral    SpO2:  95%     SpO2: 95 % O2 Flow Rate (L/min): 0 L/min FiO2 (%): (!) 0 %   Intake/Output Summary (Last 24 hours) at 02/09/2022 0734 Last data filed at 02/08/2022 1300 Gross per 24 hour  Intake 120 ml  Output --  Net 120 ml    There were no vitals filed for this visit.  Exam: General exam: In no acute distress. Respiratory system: Good air movement and clear to auscultation. Cardiovascular system: S1 & S2 heard, RRR. No JVD. Gastrointestinal system: Abdomen is nondistended, soft and nontender.  Extremities: No pedal edema. Skin: No rashes, lesions or ulcers Psychiatry: Judgement and insight appear normal. Mood & affect appropriate.  Data Reviewed:    Labs: Basic Metabolic Panel: Recent Labs  Lab 02/05/22 2050  NA 138  K 4.3  CL 100  CO2 24  GLUCOSE 136*  BUN 13  CREATININE 1.03*  CALCIUM 9.6  MG 1.7    GFR CrCl cannot be calculated (Unknown ideal weight.). Liver Function Tests: Recent Labs  Lab 02/05/22 2050  AST 22  ALT 15  ALKPHOS 69  BILITOT 1.1  PROT 6.0*  ALBUMIN 3.4*    No results for input(s): "LIPASE", "AMYLASE" in the last 168 hours. No results for input(s): "AMMONIA" in the last 168 hours. Coagulation  profile Recent Labs  Lab 02/05/22 2050  INR 1.2    COVID-19 Labs  No results for input(s): "DDIMER", "FERRITIN", "LDH", "CRP" in the last 72 hours.  Lab Results  Component Value Date   SARSCOV2NAA POSITIVE (A) 01/11/2022   SARSCOV2NAA POSITIVE (A) 05/03/2021   [place] Not Detected 04/06/2019   [place] Not Detected 03/22/2019    CBC: Recent Labs  Lab 02/05/22 2050  WBC 13.3*  HGB 11.8*  HCT 36.0  MCV 91.4  PLT 227    Cardiac Enzymes: No results for input(s): "CKTOTAL", "CKMB", "CKMBINDEX", "TROPONINI" in the last 168 hours. BNP (last 3 results) No results for input(s): "PROBNP" in the last 8760 hours. CBG: Recent Labs  Lab 02/08/22 1345  GLUCAP 226*   D-Dimer: No results for input(s): "DDIMER" in the last 72 hours. Hgb A1c: No results for input(s): "HGBA1C" in the last 72 hours. Lipid Profile: No results for input(s): "CHOL", "HDL", "LDLCALC", "TRIG", "CHOLHDL", "LDLDIRECT" in the last 72 hours. Thyroid function studies: No results  for input(s): "TSH", "T4TOTAL", "T3FREE", "THYROIDAB" in the last 72 hours.  Invalid input(s): "FREET3" Anemia work up: No results for input(s): "VITAMINB12", "FOLATE", "FERRITIN", "TIBC", "IRON", "RETICCTPCT" in the last 72 hours. Sepsis Labs: Recent Labs  Lab 02/05/22 2050  WBC 13.3*    Microbiology No results found for this or any previous visit (from the past 240 hour(s)).   Medications:    apixaban  2.5 mg Oral BID   citalopram  5 mg Oral q morning   metoprolol succinate  25 mg Oral BID   mirtazapine  15 mg Oral QHS   QUEtiapine  25 mg Oral QHS   Continuous Infusions:    LOS: 4 days   Charlynne Cousins  [facility]  02/09/2022, 7:34 AM

## 2022-02-09 NOTE — Progress Notes (Signed)
Patient's lunch arrived, patient asleep. Offered to help patient with lunch. Patient refused stating she was not hungry. Will continue to monitor.

## 2022-02-09 NOTE — Progress Notes (Signed)
  Patient asleep in room when breakfast tray arrived. Offered breakfast to patient and patient stated she did not want to eat. Will continue to monitor.

## 2022-02-09 NOTE — Progress Notes (Signed)
Tried to get patient out of bed to chair. Patient started to scream and refused to move. Was unable to move her to the chair.  Once patient settle back in bed, patient stated she was not in pain. Will continue to monitor, call bell within reach.

## 2022-02-09 NOTE — Plan of Care (Signed)
  Problem: Education: Goal: Knowledge of General Education information will improve Description Including pain rating scale, medication(s)/side effects and non-pharmacologic comfort measures Outcome: Progressing   Problem: Health Behavior/Discharge Planning: Goal: Ability to manage health-related needs will improve Outcome: Progressing   

## 2022-02-10 DIAGNOSIS — I160 Hypertensive urgency: Secondary | ICD-10-CM

## 2022-02-10 DIAGNOSIS — R531 Weakness: Secondary | ICD-10-CM

## 2022-02-10 DIAGNOSIS — Z66 Do not resuscitate: Secondary | ICD-10-CM

## 2022-02-10 MED ORDER — HYDROCODONE-ACETAMINOPHEN 5-325 MG PO TABS: 1.0000 | ORAL_TABLET | ORAL | 0 refills | Status: AC | PRN

## 2022-02-10 MED ORDER — FUROSEMIDE 20 MG PO TABS
20.0000 mg | ORAL_TABLET | Freq: Every day | ORAL | Status: DC
  Administered 2022-02-10 – 2022-02-12 (×3): 20 mg via ORAL
  Filled 2022-02-10 (×3): qty 1

## 2022-02-10 MED ORDER — ROSUVASTATIN CALCIUM 5 MG PO TABS
5.0000 mg | ORAL_TABLET | Freq: Every day | ORAL | Status: DC
  Administered 2022-02-10 – 2022-02-12 (×3): 5 mg via ORAL
  Filled 2022-02-10 (×3): qty 1

## 2022-02-10 MED ORDER — CLONIDINE HCL 0.1 MG PO TABS
0.1000 mg | ORAL_TABLET | Freq: Three times a day (TID) | ORAL | Status: DC
  Administered 2022-02-10 – 2022-02-12 (×4): 0.1 mg via ORAL
  Filled 2022-02-10 (×6): qty 1

## 2022-02-10 MED ORDER — IRBESARTAN 75 MG PO TABS: 37.5000 mg | ORAL_TABLET | Freq: Every day | ORAL | Status: DC

## 2022-02-10 MED ORDER — QUETIAPINE FUMARATE 25 MG PO TABS: 25.0000 mg | ORAL_TABLET | Freq: Every day | ORAL | Status: DC

## 2022-02-10 MED ORDER — AMLODIPINE BESYLATE 5 MG PO TABS
5.0000 mg | ORAL_TABLET | Freq: Every day | ORAL | Status: DC
  Administered 2022-02-10 – 2022-02-12 (×3): 5 mg via ORAL
  Filled 2022-02-10 (×3): qty 1

## 2022-02-10 NOTE — Progress Notes (Signed)
Mobility Specialist Progress Note   02/10/22 1520  Mobility  Activity Transferred from chair to bed  Level of Assistance +2 (takes two people)  Assistive Device Other (Comment) (HHA)  Distance Ambulated (ft) 2 ft  RLE Weight Bearing TWB  Activity Response Tolerated well  $Mobility charge 1 Mobility   NT requesting assistance to get pt from chair to bed d/t increasing discomfort. Required +2A to squat, pivot turn pt and maintain WB precaution at min A. Transferred w/o fault and w/ increased tolerance, left in bed with all needs met, and call bell in reach.  Holland Falling Mobility Specialist Acute Rehab Office:  [phone]

## 2022-02-10 NOTE — Progress Notes (Signed)
Patient ID: [name], female   DOB: 1923/03/18, 88 y.o.   MRN: [id number]    Progress Note from the Palliative Medicine Team at [facility]   Patient Name: Karina [name]        Date: 02/10/2022 DOB: 12-25-1922  Age: 88 y.o. MRN#: [id number] Attending Physician: Charlynne Cousins, MD Primary Care Physician: [name], MD Admit Date: 02/05/2022     88 y.o. female  admitted on 02/05/2022 with  past medical history significant for CAD, A-fib on Eliquis, essential hypertension, TIA comes in after fall where she tripped, x-ray of her right hip periprosthetic fracture, orthopedic surgery recommended transfer to [facility].   Orthopedic surgery recommended conservative management.  Medical records reviewed, assessed the patient.  Patient is out of bed to the chair, and oriented and engaged in conversation with her family   She worked with mobility team this morning   Plan of care -DNR/DNI -No artificial feeding  tube feeding now or in the future -Treat the treatable and hope for improvement -Skilled nursing facility for short-term rehab  Continued conversation with son/[name] Crays/H POA regarding medical situation, and concern for decompensation/failure to thrive 2/2 to hip fx and  and importance Advanced Care planning decisions moving into the future.   MOST form introduced and completed.  Secured healthcare power of attorney paperwork and scanned  Education offered today regarding  the importance of continued conversation with family and their  medical providers regarding overall plan of care and treatment options,  ensuring decisions are within the context of the patients values and GOCs.  Questions and concerns addressed      50 minutes  Wadie Lessen NP  Palliative Medicine Team Team Phone # [phone] Pager [phone]

## 2022-02-10 NOTE — TOC Progression Note (Addendum)
Transition of Care [facility]) - Progression Note    Patient Details  Name: Navaya Wiatrek MRN: [id number] Date of Birth: Oct 12, 1922  [facility] CM/SW Contact  Joanne Chars, LCSW Phone Number: 02/10/2022, 2:06 PM  Clinical Narrative:     TC LM with Katie/[facility].  1100: another attempt to reach Katie/[facility].  1345: TC Katie, she has multiple residents in hospital needing beds, does not think she will be able to offer, but will see where she is at tomorrow.  CSW met with pt and son in room, discussed current bed offers, they are requesting responses from [facility], [facility], [facility], [facility], [facility].  They are also willing to private pay at [facility].  CSW reached out to the SNFs.  Auth request submitted in [place], facility choice pending.   Expected Discharge Plan: [place] Barriers to Discharge: Continued Medical Work up, SNF Pending bed offer  Expected Discharge Plan and Services Expected Discharge Plan: [place] In-house Referral: Clinical Social Work   Post Acute Care Choice: [place] arrangements for the past 2 months: Single Family Home Expected Discharge Date: 02/10/22                                     Social Determinants of Health (SDOH) Interventions    Readmission Risk Interventions     No data to display

## 2022-02-10 NOTE — Care Management Important Message (Signed)
Important Message  Patient Details  Name: Karina [name] MRN: [id number] Date of Birth: [birth year]   Medicare Important Message Given:  Yes     Hannah Beat 02/10/2022, 12:32 PM

## 2022-02-10 NOTE — Progress Notes (Signed)
Physical Therapy Treatment Patient Details Name: Jayah Balthazar MRN: [id number] DOB: 30-Oct-1922 Today's Date: 02/10/2022   History of Present Illness 88 y/o female who presents on 02/05/22 after mechanical fall, sustaining a R hip periprosthetic fracture. Orthopedics was consulted and recommend to try and treat non-operatively. She will be TTWB on the RLE likely 6 weeks. PMH significant for CAD, a-fib, chronic edema, DDD, fibromuscular dysplasia, R hip fx s/p fixation 2008, HTN, OA, osteoporosis, pacemaker, peripheral neuropathy, PVD, tachy/brady syndrome, trigeminal neuralgia R side of face.    PT Comments    Pt received in supine, agreeable to therapy session with encouragement. Pt able to perform AROM and AAROM BLE exercises in supine with mod cues and tactile assist, pt HoH. Pt needing up to +2 maxA for bed mobility and squat stand at bedside with RW but unable to manage TTWB to pivot to chair with RW safely so given +2 HHA dependent pivot to chair from bed with bed pad and gait belt assist. Pt up in chair with family present at end of session. Pt continues to benefit from PT services to progress toward functional mobility goals.   Recommendations for follow up therapy are one component of a multi-disciplinary discharge planning process, led by the attending physician.  Recommendations may be updated based on patient status, additional functional criteria and insurance authorization.  Follow Up Recommendations  Skilled nursing-short term rehab (<3 hours/day) Can patient physically be transported by private vehicle: No   Assistance Recommended at Discharge Frequent or constant Supervision/Assistance  Patient can return home with the following Two people to help with walking and/or transfers;A lot of help with bathing/dressing/bathroom;Assistance with cooking/housework;Assist for transportation;Help with stairs or ramp for entrance   Equipment Recommendations  Rolling walker (2 wheels)     Recommendations for Other Services       Precautions / Restrictions Precautions Precautions: Fall Restrictions Weight Bearing Restrictions: Yes RLE Weight Bearing: Touchdown weight bearing (MD note specified toe touch)     Mobility  Bed Mobility Overal bed mobility: Needs Assistance Bed Mobility: Supine to Sit     Supine to sit: Max assist, +2 for physical assistance, HOB elevated     General bed mobility comments: +2 max assist and use of bed pad to advance LE's and hips around to EOB.    Transfers Overall transfer level: Needs assistance Equipment used: Rolling walker (2 wheels) Transfers: Sit to/from Stand, Bed to chair/wheelchair/BSC Sit to Stand: Max assist, +2 physical assistance, From elevated surface Stand pivot transfers: Total assist, +2 physical assistance         General transfer comment: Max A for power up and second person holding RLE for pain management and adhering to precautions. Pt able to initiate and demonstrate an anterior lean, but unable to tolerate standing unsupported or with less than mod assist, and pt was returned to sitting EOB. +2 totalA for stand pivot to chair with HHA and bed pad assist    Ambulation/Gait               General Gait Details: Unable to progress to ambulation at this time.   Stairs             Wheelchair Mobility    Modified Rankin (Stroke Patients Only)       Balance Overall balance assessment: Needs assistance Sitting-balance support: No upper extremity supported, Feet supported Sitting balance-Leahy Scale: Fair     Standing balance support: Bilateral upper extremity supported, During functional activity Standing balance-Leahy Scale: Zero  Cognition Arousal/Alertness: Awake/alert Behavior During Therapy: WFL for tasks assessed/performed Overall Cognitive Status: Within Functional Limits for tasks assessed                                  General Comments: Baseline memory for age. Overall, WFL, pt HoH causing delays in command following. Pt reports she had purewick but none was placed so bed was wet.        Exercises Other Exercises Other Exercises: supine BLE AROM: heel slides (AAROM on R), ankle pumps, LAQ x10 reps ea    General Comments General comments (skin integrity, edema, etc.): VSS on RA, per RN BP improved after meds given prior to session, no sx n/v today.      Pertinent Vitals/Pain Pain Assessment Pain Assessment: Faces Faces Pain Scale: Hurts even more Pain Location: RLE, worse with hip flexion, minimal with knee extension Pain Descriptors / Indicators: Constant, Discomfort, Guarding, Grimacing, Moaning Pain Intervention(s): Monitored during session, Limited activity within patient's tolerance, Repositioned    Home Living                          Prior Function            PT Goals (current goals can now be found in the care plan section) Acute Rehab PT Goals Patient Stated Goal: for son for her to get stronger at rehab before home PT Goal Formulation: With patient Time For Goal Achievement: 02/21/22 Progress towards PT goals: Progressing toward goals    Frequency    Min 3X/week      PT Plan Current plan remains appropriate       AM-PAC PT "6 Clicks" Mobility   Outcome Measure  Help needed turning from your back to your side while in a flat bed without using bedrails?: A Lot Help needed moving from lying on your back to sitting on the side of a flat bed without using bedrails?: Total Help needed moving to and from a bed to a chair (including a wheelchair)?: Total Help needed standing up from a chair using your arms (e.g., wheelchair or bedside chair)?: Total Help needed to walk in hospital room?: Total Help needed climbing 3-5 steps with a railing? : Total 6 Click Score: 7    End of Session Equipment Utilized During Treatment: Gait belt Activity Tolerance: Patient  limited by pain;Patient tolerated treatment well Patient left: with call bell/phone within reach;in chair;with chair alarm set;Other (comment);with family/visitor present (reclined in chair) Nurse Communication: Mobility status;Other (comment);Need for lift equipment (needs purewick placed; no lift pads available secretary notified) PT Visit Diagnosis: Unsteadiness on feet (R26.81);Pain Pain - Right/Left: Right Pain - part of body:  (hip/thigh)     Time: 7262-0355 PT Time Calculation (min) (ACUTE ONLY): 25 min  Charges:  $Therapeutic Exercise: 8-22 mins $Therapeutic Activity: 8-22 mins                     Kosisochukwu Goldberg P., PTA Acute Rehabilitation Services Secure Chat Preferred 9a-5:30pm Office: [phone]    [name] [facility] 02/10/2022, 12:59 PM

## 2022-02-10 NOTE — Discharge Summary (Signed)
Physician Discharge Summary  Karina [name] TDD:[id number] DOB: Dec 19, 1922 DOA: 02/05/2022  PCP: [name], MD  Admit date: 02/05/2022 Discharge date: 02/10/2022  Admitted From: Home Disposition:  SNF  Recommendations for Outpatient Follow-up:  Follow up with PCP in 1-2 weeks Please obtain BMP/CBC in one week   Home Health:No Equipment/Devices:None  Discharge Condition:Stable CODE STATUS:Full  Diet recommendation: Heart Healthy  Brief/Interim Summary: 88 y.o. female past medical history significant for CAD, A-fib on Eliquis, essential hypertension, TIA comes in after fall where she tripped, x-ray of her right hip periprosthetic fracture, orthopedic surgery recommended transfer to [facility]. Orthopedic surgery recommended conservative management.  Discharge Diagnoses:  Principal Problem:   Closed right hip fracture (HCC) Active Problems:   Essential hypertension   Dyslipidemia   Hypertensive urgency   Tachy-brady syndrome (HCC)   Chronic a-fib (HCC)  Acute Closed right hip fracture: Orthopedic surgery was consulted recommended nonsurgical management due to her age and multiple comorbidities. The recommended touch weightbearing for 6 weeks.  Next physical therapy evaluated the patient who recommended skilled nursing facility. Patient was told with narcotic she was having regular bowel movements. Family had a conversation with palliative care  Depression/insomnia: Continue Remeron Celexa and Seroquel.  Essential hypertension/hypertensive urgency: Her blood pressure was elevated on admission she was started on home regimen no changes were made.  Dyslipidemia: Noted.  Chronic atrial fibrillation: Continue Eliquis.  Nausea vomiting: Likely due to narcotics resolved with Zofran and Reglan. Continue Protonix as an outpatient abdominal x-ray was done that showed nonobstructive bowel gas pattern.    Discharge Instructions  Discharge Instructions     Diet -  low sodium heart healthy   Complete by: As directed    Increase activity slowly   Complete by: As directed       Allergies as of 02/10/2022       Reactions   Ace Inhibitors Hives   Amiodarone Other (See Comments)   Worsening peripheral neuropathy   Tikosyn [dofetilide] Other (See Comments)   QT prolongation   Meperidine And Related Other (See Comments)   Unknown reaction        Medication List     STOP taking these medications    azithromycin 250 MG tablet Commonly known as: ZITHROMAX       TAKE these medications    apixaban 2.5 MG Tabs tablet Commonly known as: ELIQUIS Take 1 tablet (2.5 mg total) 2 (two) times daily by mouth.   benzonatate 200 MG capsule Commonly known as: TESSALON Take 200 mg by mouth daily as needed.   citalopram 10 MG tablet Commonly known as: CELEXA Take 5 mg by mouth every morning.   cloNIDine 0.1 MG tablet Commonly known as: CATAPRES Take 0.1 mg by mouth 3 (three) times daily.   diltiazem 120 MG 24 hr capsule Commonly known as: TIAZAC Take 120 mg by mouth daily.   feeding supplement Liqd Take 237 mLs by mouth 2 (two) times daily between meals.   furosemide 40 MG tablet Commonly known as: LASIX Take 0.5 tablets (20 mg total) by mouth daily.   HYDROcodone-acetaminophen 5-325 MG tablet Commonly known as: NORCO/VICODIN Take 1-2 tablets by mouth every 4 (four) hours as needed for up to 3 days for severe pain.   metoprolol succinate 25 MG 24 hr tablet Commonly known as: TOPROL-XL Take 25 mg by mouth 2 (two) times daily.   mirtazapine 15 MG disintegrating tablet Commonly known as: REMERON SOL-TAB Take 15 mg by mouth every morning.   neomycin-polymyxin b-dexamethasone  3.5-10000-0.1 Susp Commonly known as: MAXITROL Place 2 drops into both eyes 3 (three) times daily.   nitroGLYCERIN 0.4 MG SL tablet Commonly known as: NITROSTAT   pantoprazole 40 MG tablet Commonly known as: PROTONIX Take 40 mg by mouth every morning.    QUEtiapine 25 MG tablet Commonly known as: SEROQUEL Take 1 tablet (25 mg total) by mouth at bedtime. What changed:  how much to take when to take this   rosuvastatin 5 MG tablet Commonly known as: CRESTOR Take 1 tablet (5 mg total) by mouth daily. What changed: when to take this   valsartan 80 MG tablet Commonly known as: DIOVAN Take 80 mg by mouth every morning.        Allergies  Allergen Reactions   Ace Inhibitors Hives   Amiodarone Other (See Comments)    Worsening peripheral neuropathy   Tikosyn [Dofetilide] Other (See Comments)    QT prolongation   Meperidine And Related Other (See Comments)    Unknown reaction    Consultations: Orthopedics during   Procedures/Studies: DG Abd 1 View  Result Date: 02/08/2022 CLINICAL DATA:  Abdominal pain. EXAM: ABDOMEN - 1 VIEW COMPARISON:  CT 11/02/2021 FINDINGS: No bowel dilatation to suggest obstruction. There is moderate stool in the right colon with small volume of colonic stool distally. Prior aortic stent graft repair of aneurysm. Multiple pelvic surgical clips. Prior cholecystectomy. Rib costochondral cartilage calcification. Postsurgical change in the lumbar spine. IMPRESSION: Nonobstructive bowel gas pattern. Moderate stool in the right colon. Electronically Signed   By: Keith Rake M.D.   On: 02/08/2022 16:08   DG Foot Complete Right  Result Date: 02/06/2022 CLINICAL DATA:  Right foot pain after fall EXAM: RIGHT FOOT COMPLETE - 3+ VIEW COMPARISON:  None Available. FINDINGS: Diffuse osseous demineralization. There appear to be nondisplaced fractures involving the fourth and fifth metatarsal necks. No additional fractures are seen. No dislocation. Overall mild degenerative changes of the foot. Extensive atherosclerotic vascular calcifications. No focal soft tissue swelling. IMPRESSION: Suspected nondisplaced fractures involving the fourth and fifth metatarsal necks. Correlate with point tenderness. Electronically Signed    By: Davina Poke D.O.   On: 02/06/2022 16:26   DG Pelvis 1-2 Views  Result Date: 02/06/2022 CLINICAL DATA:  Right hip pain. EXAM: PELVIS - 1-2 VIEW COMPARISON:  Pelvic/right hip radiographs 02/05/2022 FINDINGS: Sequelae of right hip arthroplasty are again identified. A mildly displaced, comminuted periprosthetic fracture of the proximal right femur involving the subtrochanteric and likely intertrochanteric regions is unchanged from yesterday's study. Prior plate and screw fixation of the more distal right femur and prior lumbar fusion are partially visualized. Extensive atherosclerotic vascular calcifications, an aortoiliac stent graft, and numerous pelvic and lower abdominal surgical clips are present. IMPRESSION: Unchanged periprosthetic fracture of the proximal right femur. Electronically Signed   By: Logan Bores M.D.   On: 02/06/2022 10:42   DG FEMUR, MIN 2 VIEWS RIGHT  Result Date: 02/06/2022 CLINICAL DATA:  Right hip pain. EXAM: RIGHT FEMUR 2 VIEWS COMPARISON:  Right hip radiographs 02/05/2022 FINDINGS: Sequelae of right hip arthroplasty are again noted. A mildly displaced, comminuted fracture of the proximal subtrochanteric/intertrochanteric region around the stem of the femoral prosthesis is unchanged from yesterday's study. Previous plate and screw fixation of a remote, healed fracture of the more distal femur is noted. The hip and knee are located. There is chondrocalcinosis in the medial and lateral compartments of the knee. Extensive atherosclerotic vascular calcifications are noted. IMPRESSION: Unchanged periprosthetic fracture of the proximal subtrochanteric/intertrochanteric right femur.  Electronically Signed   By: Logan Bores M.D.   On: 02/06/2022 10:36   CT Head Wo Contrast  Result Date: 01/11/2022 CLINICAL DATA:  Mental status change of unknown cause. Right sided intermittent facial numbness for 2 weeks. EXAM: CT HEAD WITHOUT CONTRAST TECHNIQUE: Contiguous axial images were  obtained from the base of the skull through the vertex without intravenous contrast. RADIATION DOSE REDUCTION: This exam was performed according to the departmental dose-optimization program which includes automated exposure control, adjustment of the mA and/or kV according to patient size and/or use of iterative reconstruction technique. COMPARISON:  09/18/2021 FINDINGS: Brain: No evidence of acute infarction, hemorrhage, hydrocephalus, extra-axial collection or mass lesion/mass effect. Age appropriate ventricular sulcal enlargement. Patchy white matter hypoattenuation consistent with mild to moderate chronic microvascular ischemic change. These findings are stable. Vascular: No hyperdense vessel or unexpected calcification. Skull: Normal. Negative for fracture or focal lesion. Sinuses/Orbits: Globes and orbits are unremarkable. Visualized sinuses are clear. Other: None. IMPRESSION: 1. No acute intracranial abnormalities. Electronically Signed   By: Lajean Manes M.D.   On: 01/11/2022 17:09   DG Chest 2 View  Result Date: 01/11/2022 CLINICAL DATA:  Intermittent right-sided facial numbness, weakness, leukocytosis EXAM: CHEST - 2 VIEW COMPARISON:  11/02/2021 FINDINGS: Frontal and lateral views of the chest demonstrates stable single lead pacer. Cardiac silhouette is unremarkable. No acute airspace disease, effusion, or pneumothorax. Chronic interstitial scarring. Chronic lower thoracic compression deformity unchanged. No acute bony abnormality. IMPRESSION: 1. No acute intrathoracic process. Electronically Signed   By: Randa Ngo M.D.   On: 01/11/2022 16:38   (Echo, Carotid, EGD, Colonoscopy, ERCP)    Subjective: No complaints  Discharge Exam: Vitals:   02/09/22 1300 02/09/22 2044  BP: (!) 143/102 (!) 186/89  Pulse: 89 82  Resp:    Temp: 98.1 F (36.7 C) 98.1 F (36.7 C)  SpO2: 96% 97%   Vitals:   02/09/22 0030 02/09/22 0800 02/09/22 1300 02/09/22 2044  BP: 107/73 (!) 173/99 (!) 143/102 (!)  186/89  Pulse:  79 89 82  Resp:  18    Temp:  98.2 F (36.8 C) 98.1 F (36.7 C) 98.1 F (36.7 C)  TempSrc:  Axillary Oral Oral  SpO2:  96% 96% 97%    General: Pt is alert, awake, not in acute distress Cardiovascular: RRR, S1/S2 +, no rubs, no gallops Respiratory: CTA bilaterally, no wheezing, no rhonchi Abdominal: Soft, NT, ND, bowel sounds + Extremities: no edema, no cyanosis    The results of significant diagnostics from this hospitalization (including imaging, microbiology, ancillary and laboratory) are listed below for reference.     Microbiology: No results found for this or any previous visit (from the past 240 hour(s)).   Labs: BNP (last 3 results) Recent Labs    05/05/21 1326 11/03/21 0633 01/11/22 1548  BNP 1,059.0* 697.0* 161.0*   Basic Metabolic Panel: Recent Labs  Lab 02/05/22 2050  NA 138  K 4.3  CL 100  CO2 24  GLUCOSE 136*  BUN 13  CREATININE 1.03*  CALCIUM 9.6  MG 1.7   Liver Function Tests: Recent Labs  Lab 02/05/22 2050  AST 22  ALT 15  ALKPHOS 69  BILITOT 1.1  PROT 6.0*  ALBUMIN 3.4*   No results for input(s): "LIPASE", "AMYLASE" in the last 168 hours. No results for input(s): "AMMONIA" in the last 168 hours. CBC: Recent Labs  Lab 02/05/22 2050  WBC 13.3*  HGB 11.8*  HCT 36.0  MCV 91.4  PLT 227  Cardiac Enzymes: No results for input(s): "CKTOTAL", "CKMB", "CKMBINDEX", "TROPONINI" in the last 168 hours. BNP: Invalid input(s): "POCBNP" CBG: Recent Labs  Lab 02/08/22 1345  GLUCAP 226*   D-Dimer No results for input(s): "DDIMER" in the last 72 hours. Hgb A1c No results for input(s): "HGBA1C" in the last 72 hours. Lipid Profile No results for input(s): "CHOL", "HDL", "LDLCALC", "TRIG", "CHOLHDL", "LDLDIRECT" in the last 72 hours. Thyroid function studies No results for input(s): "TSH", "T4TOTAL", "T3FREE", "THYROIDAB" in the last 72 hours.  Invalid input(s): "FREET3" Anemia work up No results for input(s):  "VITAMINB12", "FOLATE", "FERRITIN", "TIBC", "IRON", "RETICCTPCT" in the last 72 hours. Urinalysis    Component Value Date/Time   COLORURINE STRAW (A) 01/11/2022 1622   APPEARANCEUR CLEAR 01/11/2022 1622   LABSPEC 1.005 01/11/2022 1622   PHURINE 7.0 01/11/2022 1622   GLUCOSEU NEGATIVE 01/11/2022 1622   HGBUR NEGATIVE 01/11/2022 1622   BILIRUBINUR NEGATIVE 01/11/2022 1622   KETONESUR NEGATIVE 01/11/2022 1622   PROTEINUR NEGATIVE 01/11/2022 1622   UROBILINOGEN 0.2 01/18/2015 2010   NITRITE NEGATIVE 01/11/2022 1622   LEUKOCYTESUR NEGATIVE 01/11/2022 1622   Sepsis Labs Recent Labs  Lab 02/05/22 2050  WBC 13.3*   Microbiology No results found for this or any previous visit (from the past 240 hour(s)).   Time coordinating discharge: Over 30 minutes  SIGNED:   Charlynne Cousins, MD  [facility] 02/10/2022, 8:19 AM Pager   If 7PM-7AM, please contact night-coverage [email or web address] Password [email or web address]

## 2022-02-11 DIAGNOSIS — E785 Hyperlipidemia, unspecified: Secondary | ICD-10-CM

## 2022-02-11 NOTE — TOC Progression Note (Addendum)
Transition of Care [facility]) - Progression Note    Patient Details  Name: Karina [name] MRN: [id number] Date of Birth: [birth year]  [facility] CM/SW Contact  Joanne Chars, LCSW Phone Number: 02/11/2022, 10:09 AM  Clinical Narrative:   [facility] offer bed, as do [facility] and [facility].  [facility] $394/day plus $60 per 30 minutes of PT/OT. [facility], [facility] both full.  CSW spoke with son [name] by phone, provided those updates.  He will review and call back.     [facility] requesting PT note, which was uploaded.  1000: son asks CSW to call [facility] in [place].  CSW LM, did receive call back that they are full.  Son is going to visit [facility] and [facility].  1340: TC son.  Does not like [facility], was told at [place] that pt will only receive 2-3 days of PT per week.  Son talking about trying to change pt medicare from [facility] so that pt would be in network at [place].  CSW told son I am unsure how long that takes but doubt we have time to wait for this to occur.  Son is going to [facility] to look at that facility.   1410: TC son [name].  He would like to accept offer at [facility].  [facility]i updated with facility choice.  Auth approved: [id number], 3 days: 11/7-11/9.  CSW spoke with [facility].  They will have bed available tomorrow.    Expected Discharge Plan: [place] Barriers to Discharge: Continued Medical Work up, SNF Pending bed offer  Expected Discharge Plan and Services Expected Discharge Plan: [place] In-house Referral: Clinical Social Work   Post Acute Care Choice: [place] arrangements for the past 2 months: Single Family Home Expected Discharge Date: 02/10/22                                     Social Determinants of Health (SDOH) Interventions    Readmission Risk Interventions     No data to display

## 2022-02-11 NOTE — Discharge Summary (Signed)
Physician Discharge Summary  Karina [name] IZT:[id number] DOB: [birth year] DOA: 02/05/2022  PCP: [name], MD  Admit date: 02/05/2022 Discharge date: 02/11/2022  Admitted From: Home Disposition:  SNF  Recommendations for Outpatient Follow-up:  Follow up with PCP in 1-2 weeks Please obtain BMP/CBC in one week   Home Health:No Equipment/Devices:None  Discharge Condition:Stable CODE STATUS:Full  Diet recommendation: Heart Healthy  Brief/Interim Summary: 88 y.o. female past medical history significant for CAD, A-fib on Eliquis, essential hypertension, TIA comes in after fall where she tripped, x-ray of her right hip periprosthetic fracture, orthopedic surgery recommended transfer to [facility] Orthopedic surgery recommended conservative management.  Discharge Diagnoses:  Principal Problem:   Closed right hip fracture (HCC) Active Problems:   Essential hypertension   Dyslipidemia   Hypertensive urgency   Tachy-brady syndrome (HCC)   Chronic a-fib (HCC)  Acute Closed right hip fracture: Orthopedic surgery was consulted recommended nonsurgical management due to her age and multiple comorbidities. The recommended touch weightbearing for 6 weeks.  Next physical therapy evaluated the patient who recommended skilled nursing facility. Patient was told with narcotic she was having regular bowel movements. Family had a conversation with palliative care  Depression/insomnia: Continue Remeron Celexa and Seroquel.  Essential hypertension/hypertensive urgency: Her blood pressure was elevated on admission she was started on home regimen no changes were made.  Dyslipidemia: Noted.  Chronic atrial fibrillation: Continue Eliquis.  Nausea vomiting: Likely due to narcotics resolved with Zofran and Reglan. Continue Protonix as an outpatient abdominal x-ray was done that showed nonobstructive bowel gas pattern.    Discharge Instructions  Discharge Instructions     Diet -  low sodium heart healthy   Complete by: As directed    Increase activity slowly   Complete by: As directed       Allergies as of 02/11/2022       Reactions   Ace Inhibitors Hives   Amiodarone Other (See Comments)   Worsening peripheral neuropathy   Tikosyn [dofetilide] Other (See Comments)   QT prolongation   Meperidine And Related Other (See Comments)   Unknown reaction        Medication List     STOP taking these medications    azithromycin 250 MG tablet Commonly known as: ZITHROMAX       TAKE these medications    apixaban 2.5 MG Tabs tablet Commonly known as: ELIQUIS Take 1 tablet (2.5 mg total) 2 (two) times daily by mouth.   benzonatate 200 MG capsule Commonly known as: TESSALON Take 200 mg by mouth daily as needed.   citalopram 10 MG tablet Commonly known as: CELEXA Take 5 mg by mouth every morning.   cloNIDine 0.1 MG tablet Commonly known as: CATAPRES Take 0.1 mg by mouth 3 (three) times daily.   diltiazem 120 MG 24 hr capsule Commonly known as: TIAZAC Take 120 mg by mouth daily.   feeding supplement Liqd Take 237 mLs by mouth 2 (two) times daily between meals.   furosemide 40 MG tablet Commonly known as: LASIX Take 0.5 tablets (20 mg total) by mouth daily.   HYDROcodone-acetaminophen 5-325 MG tablet Commonly known as: NORCO/VICODIN Take 1-2 tablets by mouth every 4 (four) hours as needed for up to 3 days for severe pain.   metoprolol succinate 25 MG 24 hr tablet Commonly known as: TOPROL-XL Take 25 mg by mouth 2 (two) times daily.   mirtazapine 15 MG disintegrating tablet Commonly known as: REMERON SOL-TAB Take 15 mg by mouth every morning.   neomycin-polymyxin b-dexamethasone  3.5-10000-0.1 Susp Commonly known as: MAXITROL Place 2 drops into both eyes 3 (three) times daily.   nitroGLYCERIN 0.4 MG SL tablet Commonly known as: NITROSTAT   pantoprazole 40 MG tablet Commonly known as: PROTONIX Take 40 mg by mouth every morning.    QUEtiapine 25 MG tablet Commonly known as: SEROQUEL Take 1 tablet (25 mg total) by mouth at bedtime. What changed:  how much to take when to take this   rosuvastatin 5 MG tablet Commonly known as: CRESTOR Take 1 tablet (5 mg total) by mouth daily. What changed: when to take this   valsartan 80 MG tablet Commonly known as: DIOVAN Take 80 mg by mouth every morning.        Allergies  Allergen Reactions   Ace Inhibitors Hives   Amiodarone Other (See Comments)    Worsening peripheral neuropathy   Tikosyn [Dofetilide] Other (See Comments)    QT prolongation   Meperidine And Related Other (See Comments)    Unknown reaction    Consultations: Orthopedics during   Procedures/Studies: DG Abd 1 View  Result Date: 02/08/2022 CLINICAL DATA:  Abdominal pain. EXAM: ABDOMEN - 1 VIEW COMPARISON:  CT 11/02/2021 FINDINGS: No bowel dilatation to suggest obstruction. There is moderate stool in the right colon with small volume of colonic stool distally. Prior aortic stent graft repair of aneurysm. Multiple pelvic surgical clips. Prior cholecystectomy. Rib costochondral cartilage calcification. Postsurgical change in the lumbar spine. IMPRESSION: Nonobstructive bowel gas pattern. Moderate stool in the right colon. Electronically Signed   By: Keith Rake M.D.   On: 02/08/2022 16:08   DG Foot Complete Right  Result Date: 02/06/2022 CLINICAL DATA:  Right foot pain after fall EXAM: RIGHT FOOT COMPLETE - 3+ VIEW COMPARISON:  None Available. FINDINGS: Diffuse osseous demineralization. There appear to be nondisplaced fractures involving the fourth and fifth metatarsal necks. No additional fractures are seen. No dislocation. Overall mild degenerative changes of the foot. Extensive atherosclerotic vascular calcifications. No focal soft tissue swelling. IMPRESSION: Suspected nondisplaced fractures involving the fourth and fifth metatarsal necks. Correlate with point tenderness. Electronically Signed    By: Davina Poke D.O.   On: 02/06/2022 16:26   DG Pelvis 1-2 Views  Result Date: 02/06/2022 CLINICAL DATA:  Right hip pain. EXAM: PELVIS - 1-2 VIEW COMPARISON:  Pelvic/right hip radiographs 02/05/2022 FINDINGS: Sequelae of right hip arthroplasty are again identified. A mildly displaced, comminuted periprosthetic fracture of the proximal right femur involving the subtrochanteric and likely intertrochanteric regions is unchanged from yesterday's study. Prior plate and screw fixation of the more distal right femur and prior lumbar fusion are partially visualized. Extensive atherosclerotic vascular calcifications, an aortoiliac stent graft, and numerous pelvic and lower abdominal surgical clips are present. IMPRESSION: Unchanged periprosthetic fracture of the proximal right femur. Electronically Signed   By: Logan Bores M.D.   On: 02/06/2022 10:42   DG FEMUR, MIN 2 VIEWS RIGHT  Result Date: 02/06/2022 CLINICAL DATA:  Right hip pain. EXAM: RIGHT FEMUR 2 VIEWS COMPARISON:  Right hip radiographs 02/05/2022 FINDINGS: Sequelae of right hip arthroplasty are again noted. A mildly displaced, comminuted fracture of the proximal subtrochanteric/intertrochanteric region around the stem of the femoral prosthesis is unchanged from yesterday's study. Previous plate and screw fixation of a remote, healed fracture of the more distal femur is noted. The hip and knee are located. There is chondrocalcinosis in the medial and lateral compartments of the knee. Extensive atherosclerotic vascular calcifications are noted. IMPRESSION: Unchanged periprosthetic fracture of the proximal subtrochanteric/intertrochanteric right femur.  Electronically Signed   By: Logan Bores M.D.   On: 02/06/2022 10:36   (Echo, Carotid, EGD, Colonoscopy, ERCP)    Subjective: No complaints  Discharge Exam: Vitals:   02/10/22 1501 02/10/22 1804  BP: 131/80 (!) 167/95  Pulse: 97   Resp:    Temp: 98.3 F (36.8 C)   SpO2: 97%    Vitals:    02/10/22 0818 02/10/22 0900 02/10/22 1501 02/10/22 1804  BP: (!) 181/111 116/69 131/80 (!) 167/95  Pulse:   97   Resp:      Temp:   98.3 F (36.8 C)   TempSrc:   Oral   SpO2:   97%     General: Pt is alert, awake, not in acute distress Cardiovascular: RRR, S1/S2 +, no rubs, no gallops Respiratory: CTA bilaterally, no wheezing, no rhonchi Abdominal: Soft, NT, ND, bowel sounds + Extremities: no edema, no cyanosis    The results of significant diagnostics from this hospitalization (including imaging, microbiology, ancillary and laboratory) are listed below for reference.     Microbiology: No results found for this or any previous visit (from the past 240 hour(s)).   Labs: BNP (last 3 results) Recent Labs    05/05/21 1326 11/03/21 0633 01/11/22 1548  BNP 1,059.0* 697.0* 382.5*   Basic Metabolic Panel: Recent Labs  Lab 02/05/22 2050  NA 138  K 4.3  CL 100  CO2 24  GLUCOSE 136*  BUN 13  CREATININE 1.03*  CALCIUM 9.6  MG 1.7   Liver Function Tests: Recent Labs  Lab 02/05/22 2050  AST 22  ALT 15  ALKPHOS 69  BILITOT 1.1  PROT 6.0*  ALBUMIN 3.4*   No results for input(s): "LIPASE", "AMYLASE" in the last 168 hours. No results for input(s): "AMMONIA" in the last 168 hours. CBC: Recent Labs  Lab 02/05/22 2050  WBC 13.3*  HGB 11.8*  HCT 36.0  MCV 91.4  PLT 227   Cardiac Enzymes: No results for input(s): "CKTOTAL", "CKMB", "CKMBINDEX", "TROPONINI" in the last 168 hours. BNP: Invalid input(s): "POCBNP" CBG: Recent Labs  Lab 02/08/22 1345  GLUCAP 226*   D-Dimer No results for input(s): "DDIMER" in the last 72 hours. Hgb A1c No results for input(s): "HGBA1C" in the last 72 hours. Lipid Profile No results for input(s): "CHOL", "HDL", "LDLCALC", "TRIG", "CHOLHDL", "LDLDIRECT" in the last 72 hours. Thyroid function studies No results for input(s): "TSH", "T4TOTAL", "T3FREE", "THYROIDAB" in the last 72 hours.  Invalid input(s): "FREET3" Anemia work  up No results for input(s): "VITAMINB12", "FOLATE", "FERRITIN", "TIBC", "IRON", "RETICCTPCT" in the last 72 hours. Urinalysis    Component Value Date/Time   COLORURINE STRAW (A) 01/11/2022 1622   APPEARANCEUR CLEAR 01/11/2022 1622   LABSPEC 1.005 01/11/2022 1622   PHURINE 7.0 01/11/2022 1622   GLUCOSEU NEGATIVE 01/11/2022 1622   HGBUR NEGATIVE 01/11/2022 1622   BILIRUBINUR NEGATIVE 01/11/2022 1622   KETONESUR NEGATIVE 01/11/2022 1622   PROTEINUR NEGATIVE 01/11/2022 1622   UROBILINOGEN 0.2 01/18/2015 2010   NITRITE NEGATIVE 01/11/2022 1622   LEUKOCYTESUR NEGATIVE 01/11/2022 1622   Sepsis Labs Recent Labs  Lab 02/05/22 2050  WBC 13.3*   Microbiology No results found for this or any previous visit (from the past 240 hour(s)).   Time coordinating discharge: Over 30 minutes  SIGNED:   Charlynne Cousins, MD  [facility] 02/11/2022, 7:26 AM Pager   If 7PM-7AM, please contact night-coverage [email or web address] Password [email or web address]

## 2022-02-12 DIAGNOSIS — K219 Gastro-esophageal reflux disease without esophagitis: Secondary | ICD-10-CM | POA: Diagnosis not present

## 2022-02-12 DIAGNOSIS — E119 Type 2 diabetes mellitus without complications: Secondary | ICD-10-CM | POA: Diagnosis not present

## 2022-02-12 DIAGNOSIS — I160 Hypertensive urgency: Secondary | ICD-10-CM | POA: Diagnosis not present

## 2022-02-12 DIAGNOSIS — I10 Essential (primary) hypertension: Secondary | ICD-10-CM | POA: Diagnosis not present

## 2022-02-12 DIAGNOSIS — S72001A Fracture of unspecified part of neck of right femur, initial encounter for closed fracture: Secondary | ICD-10-CM | POA: Diagnosis not present

## 2022-02-12 DIAGNOSIS — R040 Epistaxis: Secondary | ICD-10-CM | POA: Diagnosis not present

## 2022-02-12 DIAGNOSIS — I495 Sick sinus syndrome: Secondary | ICD-10-CM | POA: Diagnosis not present

## 2022-02-12 DIAGNOSIS — F32A Depression, unspecified: Secondary | ICD-10-CM | POA: Diagnosis not present

## 2022-02-12 DIAGNOSIS — Z4789 Encounter for other orthopedic aftercare: Secondary | ICD-10-CM | POA: Diagnosis not present

## 2022-02-12 DIAGNOSIS — Z743 Need for continuous supervision: Secondary | ICD-10-CM | POA: Diagnosis not present

## 2022-02-12 DIAGNOSIS — I4891 Unspecified atrial fibrillation: Secondary | ICD-10-CM | POA: Diagnosis not present

## 2022-02-12 DIAGNOSIS — M9701XD Periprosthetic fracture around internal prosthetic right hip joint, subsequent encounter: Secondary | ICD-10-CM | POA: Diagnosis not present

## 2022-02-12 DIAGNOSIS — W19XXXA Unspecified fall, initial encounter: Secondary | ICD-10-CM | POA: Diagnosis not present

## 2022-02-12 DIAGNOSIS — S72009A Fracture of unspecified part of neck of unspecified femur, initial encounter for closed fracture: Secondary | ICD-10-CM | POA: Diagnosis not present

## 2022-02-12 DIAGNOSIS — I480 Paroxysmal atrial fibrillation: Secondary | ICD-10-CM | POA: Diagnosis not present

## 2022-02-12 DIAGNOSIS — S92355D Nondisplaced fracture of fifth metatarsal bone, left foot, subsequent encounter for fracture with routine healing: Secondary | ICD-10-CM | POA: Diagnosis not present

## 2022-02-12 DIAGNOSIS — I110 Hypertensive heart disease with heart failure: Secondary | ICD-10-CM | POA: Diagnosis not present

## 2022-02-12 DIAGNOSIS — M7989 Other specified soft tissue disorders: Secondary | ICD-10-CM | POA: Diagnosis not present

## 2022-02-12 DIAGNOSIS — R531 Weakness: Secondary | ICD-10-CM | POA: Diagnosis not present

## 2022-02-12 DIAGNOSIS — S7291XA Unspecified fracture of right femur, initial encounter for closed fracture: Secondary | ICD-10-CM | POA: Diagnosis not present

## 2022-02-12 DIAGNOSIS — I5043 Acute on chronic combined systolic (congestive) and diastolic (congestive) heart failure: Secondary | ICD-10-CM | POA: Diagnosis not present

## 2022-02-12 DIAGNOSIS — S92344D Nondisplaced fracture of fourth metatarsal bone, right foot, subsequent encounter for fracture with routine healing: Secondary | ICD-10-CM | POA: Diagnosis not present

## 2022-02-12 NOTE — Progress Notes (Signed)
[facility] PROGRESS NOTE    Progress Note  Karina [name]  OZH:[id number] DOB: 08-Sep-1922 DOA: 02/05/2022 PCP: [name], MD     Brief Narrative:   Karina [name] is an 88 y.o. female past medical history significant for CAD, A-fib on Eliquis, essential hypertension, TIA comes in after fall where she tripped, x-ray of her right hip periprosthetic fracture, orthopedic surgery recommended transfer to [facility]. Orthopedic surgery recommended conservative management. Patient is medically stable to go to skilled nursing facility   Assessment/Plan:   Acute periprosthetic closed right hip fracture: Orthopedic surgery evaluated the patient recommended not surgical management. Due to her age and multiple comorbidities. Touch weightbearing for 6 weeks. Consult palliative care for end-of-life discussion. Awaiting skilled nursing facility placement.  Depression/insomnia: Continue Remeron, Celexa and Seroquel.  Essential hypertension/hypertensive urgency: Elevated blood pressure on admission was likely due to pain that improved. Continue metoprolol blood pressure at goal. Blood pressures well trolled continue amlodipine, clonidine, metoprolol.  Dyslipidemia Noted  Chronic a-fib ([place]): Rate controlled, continue Eliquis.  Nausea and vomiting: 1 episode of vomiting overnight, continue Zofran and Reglan.  Probably due to narcotics. Started on Protonix twice daily. Abdominal x-ray showed nonobstructive bowel gas pattern with moderate stool in the right colon.   Decubitus ulcer stage I present on admission RN Pressure Injury Documentation:     Estimated body mass index is 21.74 kg/m as calculated from the following:   Height as of this encounter: '5\' 1"'$  (1.549 m).   Weight as of this encounter: 52.2 kg.   DVT prophylaxis: lovenox Family Communication:none Status is: Observation The patient will require care spanning > 2 midnights and should be moved to  inpatient because: Acute periprostatic right hip fracture    Code Status:     Code Status Orders  (From admission, onward)           Start     Ordered   02/05/22 2352  Do not attempt resuscitation (DNR)  Continuous       Question Answer Comment  In the event of cardiac or respiratory ARREST Do not call a "code blue"   In the event of cardiac or respiratory ARREST Do not perform Intubation, CPR, defibrillation or ACLS   In the event of cardiac or respiratory ARREST Use medication by any route, position, wound care, and other measures to relive pain and suffering. May use oxygen, suction and manual treatment of airway obstruction as needed for comfort.      02/05/22 2351           Code Status History     Date Active Date Inactive Code Status Order ID Comments User Context   02/05/2022 2017 02/05/2022 2351 Full Code [id number]  Emilee Hero, MD Inpatient   11/03/2021 0114 11/05/2021 1816 DNR [id number]  Truett Mainland, DO Inpatient   05/05/2021 1910 05/08/2021 1953 DNR [id number]  Rise Patience, MD ED   05/05/2021 1654 05/05/2021 1910 Full Code [id number]  Rise Patience, MD ED   02/23/2017 1739 03/19/2017 1741 Full Code [id number]  Cathlyn Parsons, PA-C Inpatient   02/23/2017 1739 02/23/2017 1739 Full Code [id number]  Cathlyn Parsons, PA-C Inpatient   02/15/2017 0004 02/23/2017 1726 Full Code [id number]  Bonnita Hollow, MD ED   01/18/2015 2135 01/20/2015 1637 Full Code [id number]  Waldemar Dickens, MD Inpatient   01/29/2014 1622 01/30/2014 1646 Full Code [id number]  Isaac Bliss, Rayford Halsted, MD Inpatient         IV Access:  Peripheral IV   Procedures and diagnostic studies:   No results found.   Medical Consultants:   None.   Subjective:    Karina [name] pain is controlled had a good night  Objective:    Vitals:   02/11/22 1454 02/11/22 1457 02/11/22 2141 02/12/22 0731  BP:  104/76 111/73 119/77  Pulse:  86 88 90  Resp:   16   Temp:    98.1 F (36.7 C)   TempSrc:   Oral   SpO2:  95% 96% 98%  Weight: 52.2 kg     Height: '5\' 1"'$  (1.549 m)      SpO2: 98 % O2 Flow Rate (L/min): 0 L/min FiO2 (%): (!) 0 %   Intake/Output Summary (Last 24 hours) at 02/12/2022 0749 Last data filed at 02/12/2022 0449 Gross per 24 hour  Intake 120 ml  Output 650 ml  Net -530 ml    Filed Weights   02/11/22 1454  Weight: 52.2 kg    Exam: General exam: In no acute distress. Respiratory system: Good air movement and clear to auscultation. Cardiovascular system: S1 & S2 heard, RRR. No JVD. Gastrointestinal system: Abdomen is nondistended, soft and nontender.  Extremities: No pedal edema. Skin: No rashes, lesions or ulcers Psychiatry: Judgement and insight appear normal. Mood & affect appropriate.  Data Reviewed:    Labs: Basic Metabolic Panel: Recent Labs  Lab 02/05/22 2050  NA 138  K 4.3  CL 100  CO2 24  GLUCOSE 136*  BUN 13  CREATININE 1.03*  CALCIUM 9.6  MG 1.7    GFR Estimated Creatinine Clearance: 23.6 mL/min (A) (by C-G formula based on SCr of 1.03 mg/dL (H)). Liver Function Tests: Recent Labs  Lab 02/05/22 2050  AST 22  ALT 15  ALKPHOS 69  BILITOT 1.1  PROT 6.0*  ALBUMIN 3.4*    No results for input(s): "LIPASE", "AMYLASE" in the last 168 hours. No results for input(s): "AMMONIA" in the last 168 hours. Coagulation profile Recent Labs  Lab 02/05/22 2050  INR 1.2    COVID-19 Labs  No results for input(s): "DDIMER", "FERRITIN", "LDH", "CRP" in the last 72 hours.  Lab Results  Component Value Date   SARSCOV2NAA POSITIVE (A) 01/11/2022   SARSCOV2NAA POSITIVE (A) 05/03/2021   [place] Not Detected 04/06/2019   [place] Not Detected 03/22/2019    CBC: Recent Labs  Lab 02/05/22 2050  WBC 13.3*  HGB 11.8*  HCT 36.0  MCV 91.4  PLT 227    Cardiac Enzymes: No results for input(s): "CKTOTAL", "CKMB", "CKMBINDEX", "TROPONINI" in the last 168 hours. BNP (last 3 results) No results  for input(s): "PROBNP" in the last 8760 hours. CBG: Recent Labs  Lab 02/08/22 1345  GLUCAP 226*    D-Dimer: No results for input(s): "DDIMER" in the last 72 hours. Hgb A1c: No results for input(s): "HGBA1C" in the last 72 hours. Lipid Profile: No results for input(s): "CHOL", "HDL", "LDLCALC", "TRIG", "CHOLHDL", "LDLDIRECT" in the last 72 hours. Thyroid function studies: No results for input(s): "TSH", "T4TOTAL", "T3FREE", "THYROIDAB" in the last 72 hours.  Invalid input(s): "FREET3" Anemia work up: No results for input(s): "VITAMINB12", "FOLATE", "FERRITIN", "TIBC", "IRON", "RETICCTPCT" in the last 72 hours. Sepsis Labs: Recent Labs  Lab 02/05/22 2050  WBC 13.3*    Microbiology No results found for this or any previous visit (from the past 240 hour(s)).   Medications:    amLODipine  5 mg Oral Daily   apixaban  2.5 mg Oral BID  citalopram  5 mg Oral q morning   cloNIDine  0.1 mg Oral TID   furosemide  20 mg Oral Daily   metoprolol succinate  25 mg Oral BID   mirtazapine  15 mg Oral QHS   pantoprazole  40 mg Oral BID   QUEtiapine  25 mg Oral QHS   rosuvastatin  5 mg Oral Daily   Continuous Infusions:    LOS: 6 days   Charlynne Cousins  [facility]  02/12/2022, 7:49 AM

## 2022-02-12 NOTE — Progress Notes (Signed)
Occupational Therapy Treatment Patient Details Name: Karina [name] MRN: [id number] DOB: [birth year] Today's Date: 02/12/2022   History of present illness 88 y/o female who presents on 02/05/22 after mechanical fall, sustaining a R hip periprosthetic fracture. Orthopedics was consulted and recommend to try and treat non-operatively. She will be TTWB on the RLE likely 6 weeks. PMH significant for CAD, a-fib, chronic edema, DDD, fibromuscular dysplasia, R hip fx s/p fixation 2008, HTN, OA, osteoporosis, pacemaker, peripheral neuropathy, PVD, tachy/brady syndrome, trigeminal neuralgia R side of face.   OT comments  Patient received in supine and asking to wash up. Patient performed bathing seated on EOB with min assist for UB and mod assist for LB bathing with min assist to change gown. Patient was max assist to donn mesh panties and stood with max assist +2 to pull up and assistance to maintain WB precautions. Patient assisted back to supine due to transport arrived to take her to SNF. PT/OT assisted transport to transfer patient to stretcher with total assist +3.  Patient would benefit from further OT services in SNF setting.    Recommendations for follow up therapy are one component of a multi-disciplinary discharge planning process, led by the attending physician.  Recommendations may be updated based on patient status, additional functional criteria and insurance authorization.    Follow Up Recommendations  Skilled nursing-short term rehab (<3 hours/day)    Assistance Recommended at Discharge Frequent or constant Supervision/Assistance  Patient can return home with the following  Two people to help with walking and/or transfers;Two people to help with bathing/dressing/bathroom   Equipment Recommendations  Other (comment)    Recommendations for Other Services      Precautions / Restrictions Precautions Precautions: Fall Restrictions Weight Bearing Restrictions: Yes RLE Weight Bearing:  Touchdown weight bearing (MD note specified toe touch)       Mobility Bed Mobility Overal bed mobility: Needs Assistance Bed Mobility: Supine to Sit, Sit to Supine     Supine to sit: Mod assist, +2 for physical assistance Sit to supine: Max assist, +2 for physical assistance   General bed mobility comments: increased assistance to return to supine due to fatigue    Transfers Overall transfer level: Needs assistance Equipment used: Rolling walker (2 wheels) Transfers: Sit to/from Stand, Bed to chair/wheelchair/BSC Sit to Stand: Max assist, +2 physical assistance          Lateral/Scoot Transfers: Max assist, +2 physical assistance General transfer comment: stood to pull up mesh underweare with max assist +2     Balance Overall balance assessment: Needs assistance Sitting-balance support: No upper extremity supported, Feet supported Sitting balance-Leahy Scale: Poor Sitting balance - Comments: pt needing consistent min guard to minA for seated balance while performing UB/LB reaching and body washing tasks (dynamic seated balance)   Standing balance support: Bilateral upper extremity supported, During functional activity Standing balance-Leahy Scale: Zero Standing balance comment: +2 maxA while PTA/OT assisting to pull up her briefs standing with +2 forearm HHA                           ADL either performed or assessed with clinical judgement   ADL Overall ADL's : Needs assistance/impaired     Grooming: Wash/dry hands;Wash/dry face;Min guard;Sitting Grooming Details (indicate cue type and reason): on EOB Upper Body Bathing: Minimal assistance;Sitting Upper Body Bathing Details (indicate cue type and reason): on EOB Lower Body Bathing: Moderate assistance;Sitting/lateral leans Lower Body Bathing Details (indicate cue type and reason):  seated on EOB Upper Body Dressing : Minimal assistance;Sitting Upper Body Dressing Details (indicate cue type and reason):  changed gown Lower Body Dressing: Maximal assistance;Sit to/from stand Lower Body Dressing Details (indicate cue type and reason): assistance to thread legs into clothing and to pull up while standing with assitsance of 2               General ADL Comments: bathing performed seated on EOB with assistance of 2 to stand to pull up mesh underwear    Extremity/Trunk Assessment              Vision       Perception     Praxis      Cognition Arousal/Alertness: Awake/alert Behavior During Therapy: WFL for tasks assessed/performed Overall Cognitive Status: Within Functional Limits for tasks assessed                                 General Comments: pleasant and cooperative        Exercises      Shoulder Instructions       General Comments VSS per chart review, no dizziness or acute s/sx distress    Pertinent Vitals/ Pain       Pain Assessment Pain Assessment: Faces Faces Pain Scale: Hurts even more Pain Location: RLE, worse with hip flexion, minimal with knee extension Pain Descriptors / Indicators: Constant, Discomfort, Guarding, Grimacing Pain Intervention(s): Limited activity within patient's tolerance, Monitored during session, Repositioned  Home Living                                          Prior Functioning/Environment              Frequency  Min 2X/week        Progress Toward Goals  OT Goals(current goals can now be found in the care plan section)  Progress towards OT goals: Progressing toward goals  Acute Rehab OT Goals Patient Stated Goal: get better OT Goal Formulation: With patient Time For Goal Achievement: 02/21/22 Potential to Achieve Goals: Good ADL Goals Pt Will Perform Upper Body Dressing: with set-up;with supervision;sitting Pt Will Perform Lower Body Dressing: with mod assist;sitting/lateral leans;with adaptive equipment Pt Will Transfer to Toilet: with mod assist;bedside commode;squat pivot  transfer;with +2 assist;with transfer board Pt Will Perform Toileting - Clothing Manipulation and hygiene: with mod assist;sitting/lateral leans Additional ADL Goal #1: Pt will perform bed mobility with Min A +2 in preparation for ADLs  Plan Discharge plan remains appropriate    Co-evaluation    PT/OT/SLP Co-Evaluation/Treatment: Yes Reason for Co-Treatment: For patient/therapist safety;To address functional/ADL transfers PT goals addressed during session: Balance;Mobility/safety with mobility;Strengthening/ROM OT goals addressed during session: ADL's and self-care      AM-PAC OT "6 Clicks" Daily Activity     Outcome Measure   Help from another person eating meals?: A Little Help from another person taking care of personal grooming?: A Little Help from another person toileting, which includes using toliet, bedpan, or urinal?: A Lot Help from another person bathing (including washing, rinsing, drying)?: A Lot Help from another person to put on and taking off regular upper body clothing?: A Little Help from another person to put on and taking off regular lower body clothing?: A Lot 6 Click Score: 15    End of Session  OT Visit Diagnosis: Unsteadiness on feet (R26.81);Other abnormalities of gait and mobility (R26.89);Muscle weakness (generalized) (M62.81)   Activity Tolerance Patient tolerated treatment well   Patient Left in bed;Other (comment) (with PTAR to transport to SNF)   Nurse Communication Mobility status        Time: 0475-3391 OT Time Calculation (min): 25 min  Charges: OT General Charges $OT Visit: 1 Visit OT Treatments $Self Care/Home Management : 8-22 mins  Lodema Hong, [place]  Office [place] 02/12/2022, 1:27 PM

## 2022-02-12 NOTE — Progress Notes (Signed)
Physical Therapy Treatment Patient Details Name: Karina [name] MRN: [id number] DOB: 01-29-1923 Today's Date: 02/12/2022   History of Present Illness 88 y/o female who presents on 02/05/22 after mechanical fall, sustaining a R hip periprosthetic fracture. Orthopedics was consulted and recommend to try and treat non-operatively. She will be TTWB on the RLE likely 6 weeks. PMH significant for CAD, a-fib, chronic edema, DDD, fibromuscular dysplasia, R hip fx s/p fixation 2008, HTN, OA, osteoporosis, pacemaker, peripheral neuropathy, PVD, tachy/brady syndrome, trigeminal neuralgia R side of face.    PT Comments    Pt received in supine, agreeable to therapy session with emphasis on bed mobility, seated balance training and transfer training. Pt needing up to +2 maxA for bed mobility, sit<>stand and seated scooting and needing up to minA for seated dynamic balance tasks. Pt session time limited due to arrival of transport team, pt assisted up onto stretcher with assist from OT/transport with totalA +3. Pt continues to benefit from PT services to progress toward functional mobility goals.   Recommendations for follow up therapy are one component of a multi-disciplinary discharge planning process, led by the attending physician.  Recommendations may be updated based on patient status, additional functional criteria and insurance authorization.  Follow Up Recommendations  Skilled nursing-short term rehab (<3 hours/day) Can patient physically be transported by private vehicle: No   Assistance Recommended at Discharge Frequent or constant Supervision/Assistance  Patient can return home with the following Two people to help with walking and/or transfers;A lot of help with bathing/dressing/bathroom;Assistance with cooking/housework;Assist for transportation;Help with stairs or ramp for entrance   Equipment Recommendations  Rolling walker (2 wheels)    Recommendations for Other Services        Precautions / Restrictions Precautions Precautions: Fall Restrictions Weight Bearing Restrictions: Yes RLE Weight Bearing: Touchdown weight bearing (MD note specified toe touch)     Mobility  Bed Mobility Overal bed mobility: Needs Assistance Bed Mobility: Supine to Sit, Sit to Supine     Supine to sit: Mod assist, +2 for physical assistance Sit to supine: Max assist, +2 for physical assistance   General bed mobility comments: increased assist to return to supine due to fatigue and pain; pt following simple 1-step sequencing cues for bed mobility with increased time and able to assist with BLE transition to EOB and propping up on her L elbow with multimodal cues.    Transfers Overall transfer level: Needs assistance Equipment used: Rolling walker (2 wheels) Transfers: Sit to/from Stand, Bed to chair/wheelchair/BSC Sit to Stand: Max assist, +2 physical assistance          Lateral/Scoot Transfers: Max assist, +2 physical assistance General transfer comment: Max A for power up and second person holding RLE for pain management and adhering to precautions. Defer transfer OOB to chair as transport team arriving to take her to SNF, so pt returned to sitting EOB and performed x2 lateral seated scoots toward HOB with +2 maxA    Ambulation/Gait               General Gait Details: Unable to progress to ambulation at this time.       Balance Overall balance assessment: Needs assistance Sitting-balance support: No upper extremity supported, Feet supported Sitting balance-Leahy Scale: Poor Sitting balance - Comments: pt needing consistent min guard to minA for seated balance while performing UB/LB reaching and body washing tasks (dynamic seated balance)   Standing balance support: Bilateral upper extremity supported, During functional activity Standing balance-Leahy Scale: Zero Standing balance comment: +  2 maxA while PTA/OT assisting to pull up her briefs standing with +2  forearm HHA                            Cognition Arousal/Alertness: Awake/alert Behavior During Therapy: WFL for tasks assessed/performed Overall Cognitive Status: Within Functional Limits for tasks assessed                                 General Comments: Baseline memory for age. Overall, WFL, pt HoH causing delays in command following. Pleasant demeanor.        Exercises Other Exercises Other Exercises: supine BLE AROM: hip ab/ADduction (AA on RLE), PROM for ankle pumps, heel slides (3 reps only) with AA on RLE x10 reps unless indicated Other Exercises: seated lateral leans and anterior leans x 5 reps ea Other Exercises: STS x 1 rep for strengthening    General Comments General comments (skin integrity, edema, etc.): VSS per chart review, no dizziness or acute s/sx distress      Pertinent Vitals/Pain Pain Assessment Pain Assessment: Faces Faces Pain Scale: Hurts even more Pain Location: RLE, worse with hip flexion, minimal with knee extension Pain Descriptors / Indicators: Constant, Discomfort, Guarding, Grimacing Pain Intervention(s): Limited activity within patient's tolerance, Monitored during session, Repositioned           PT Goals (current goals can now be found in the care plan section) Acute Rehab PT Goals Patient Stated Goal: Per son, for her to get stronger at rehab before home PT Goal Formulation: With patient Time For Goal Achievement: 02/21/22 Progress towards PT goals: Progressing toward goals    Frequency    Min 3X/week      PT Plan Current plan remains appropriate    Co-evaluation PT/OT/SLP Co-Evaluation/Treatment: Yes Reason for Co-Treatment: For patient/therapist safety;To address functional/ADL transfers PT goals addressed during session: Balance;Mobility/safety with mobility;Strengthening/ROM        AM-PAC PT "6 Clicks" Mobility   Outcome Measure  Help needed turning from your back to your side while in a  flat bed without using bedrails?: A Lot Help needed moving from lying on your back to sitting on the side of a flat bed without using bedrails?: A Lot Help needed moving to and from a bed to a chair (including a wheelchair)?: Total Help needed standing up from a chair using your arms (e.g., wheelchair or bedside chair)?: Total Help needed to walk in hospital room?: Total Help needed climbing 3-5 steps with a railing? : Total 6 Click Score: 8    End of Session Equipment Utilized During Treatment: Gait belt Activity Tolerance: Patient tolerated treatment well Patient left: Other (comment);in bed;with nursing/sitter in room (in care of transport techs x2, awaiting RN) Nurse Communication: Mobility status;Other (comment) (transport here, pt has meds on RN cart and not sure if she needs to take them or not. RN not available so charge RN notified.) PT Visit Diagnosis: Unsteadiness on feet (R26.81);Pain Pain - Right/Left: Right Pain - part of body:  (hip/thigh)     Time: 0086-7619 PT Time Calculation (min) (ACUTE ONLY): 25 min  Charges:  $Therapeutic Exercise: 8-22 mins                     Rocio Wolak P., PTA Acute Rehabilitation Services Secure Chat Preferred 9a-5:30pm Office: [phone]    [name] [facility] 02/12/2022, 11:37 AM

## 2022-02-12 NOTE — TOC Transition Note (Addendum)
Transition of Care [facility]) - CM/SW Discharge Note   Patient Details  Name: Karina [name] MRN: [id number] Date of Birth: 01/07/1923  [facility] CM/SW Contact:  Joanne Chars, LCSW Phone Number: 02/12/2022, 10:30 AM   Clinical Narrative:   Pt discharging to [facility], room [place].  RN call report to [phone].    Final next level of care: Skilled Nursing Facility Barriers to Discharge: Barriers Resolved   Patient Goals and CMS Choice Patient states their goals for this hospitalization and ongoing recovery are:: enjoy life CMS Medicare.gov Compare Post Acute Care list provided to:: Patient Choice offered to / list presented to : Patient  Discharge Placement              Patient chooses bed at:  [facility]) Patient to be transferred to facility by: [place] Name of family member notified: left message son [name].  Son [name] now in room and aware Patient and family notified of of transfer: 02/12/22  Discharge Plan and Services In-house Referral: Clinical Social Work   Post Acute Care Choice: [place]                               Social Determinants of Health (SDOH) Interventions     Readmission Risk Interventions     No data to display

## 2022-02-12 NOTE — Discharge Summary (Signed)
Physician Discharge Summary  Karina [name] LZJ:[id number] DOB: 1922/06/16 DOA: 02/05/2022  PCP: [name], MD  Admit date: 02/05/2022 Discharge date: 02/12/2022  Admitted From: Home Disposition:  SNF  Recommendations for Outpatient Follow-up:  Follow up with PCP in 1-2 weeks Please obtain BMP/CBC in one week   Home Health:No Equipment/Devices:None  Discharge Condition:Stable CODE STATUS:Full  Diet recommendation: Heart Healthy  Brief/Interim Summary: 88 y.o. female past medical history significant for CAD, A-fib on Eliquis, essential hypertension, TIA comes in after fall where she tripped, x-ray of her right hip periprosthetic fracture, orthopedic surgery recommended transfer to [facility]. Orthopedic surgery recommended conservative management.  Discharge Diagnoses:  Principal Problem:   Closed right hip fracture (HCC) Active Problems:   Essential hypertension   Dyslipidemia   Hypertensive urgency   Tachy-brady syndrome (HCC)   Chronic a-fib (HCC)  Acute Closed right hip fracture: Orthopedic surgery was consulted recommended nonsurgical management due to her age and multiple comorbidities. The recommended touch weightbearing for 6 weeks.  Next physical therapy evaluated the patient who recommended skilled nursing facility. Patient was told with narcotic she was having regular bowel movements. Family had a conversation with palliative care  Depression/insomnia: Continue Remeron Celexa and Seroquel.  Essential hypertension/hypertensive urgency: Her blood pressure was elevated on admission she was started on home regimen no changes were made.  Dyslipidemia: Noted.  Chronic atrial fibrillation: Continue Eliquis.  Nausea vomiting: Likely due to narcotics resolved with Zofran and Reglan. Continue Protonix as an outpatient abdominal x-ray was done that showed nonobstructive bowel gas pattern.    Discharge Instructions  Discharge Instructions     Diet -  low sodium heart healthy   Complete by: As directed    Increase activity slowly   Complete by: As directed       Allergies as of 02/12/2022       Reactions   Ace Inhibitors Hives   Amiodarone Other (See Comments)   Worsening peripheral neuropathy   Tikosyn [dofetilide] Other (See Comments)   QT prolongation   Meperidine And Related Other (See Comments)   Unknown reaction        Medication List     STOP taking these medications    azithromycin 250 MG tablet Commonly known as: ZITHROMAX       TAKE these medications    apixaban 2.5 MG Tabs tablet Commonly known as: ELIQUIS Take 1 tablet (2.5 mg total) 2 (two) times daily by mouth.   benzonatate 200 MG capsule Commonly known as: TESSALON Take 200 mg by mouth daily as needed.   citalopram 10 MG tablet Commonly known as: CELEXA Take 5 mg by mouth every morning.   cloNIDine 0.1 MG tablet Commonly known as: CATAPRES Take 0.1 mg by mouth 3 (three) times daily.   diltiazem 120 MG 24 hr capsule Commonly known as: TIAZAC Take 120 mg by mouth daily.   feeding supplement Liqd Take 237 mLs by mouth 2 (two) times daily between meals.   furosemide 40 MG tablet Commonly known as: LASIX Take 0.5 tablets (20 mg total) by mouth daily.   HYDROcodone-acetaminophen 5-325 MG tablet Commonly known as: NORCO/VICODIN Take 1-2 tablets by mouth every 4 (four) hours as needed for up to 3 days for severe pain.   metoprolol succinate 25 MG 24 hr tablet Commonly known as: TOPROL-XL Take 25 mg by mouth 2 (two) times daily.   mirtazapine 15 MG disintegrating tablet Commonly known as: REMERON SOL-TAB Take 15 mg by mouth every morning.   neomycin-polymyxin b-dexamethasone  3.5-10000-0.1 Susp Commonly known as: MAXITROL Place 2 drops into both eyes 3 (three) times daily.   nitroGLYCERIN 0.4 MG SL tablet Commonly known as: NITROSTAT   pantoprazole 40 MG tablet Commonly known as: PROTONIX Take 40 mg by mouth every morning.    QUEtiapine 25 MG tablet Commonly known as: SEROQUEL Take 1 tablet (25 mg total) by mouth at bedtime. What changed:  how much to take when to take this   rosuvastatin 5 MG tablet Commonly known as: CRESTOR Take 1 tablet (5 mg total) by mouth daily. What changed: when to take this   valsartan 80 MG tablet Commonly known as: DIOVAN Take 80 mg by mouth every morning.        Contact information for after-discharge care     Destination     [facility] .   Service: Skilled Nursing Contact information: [address] [phone]                    Allergies  Allergen Reactions   Ace Inhibitors Hives   Amiodarone Other (See Comments)    Worsening peripheral neuropathy   Tikosyn [Dofetilide] Other (See Comments)    QT prolongation   Meperidine And Related Other (See Comments)    Unknown reaction    Consultations: Orthopedics during   Procedures/Studies: DG Abd 1 View  Result Date: 02/08/2022 CLINICAL DATA:  Abdominal pain. EXAM: ABDOMEN - 1 VIEW COMPARISON:  CT 11/02/2021 FINDINGS: No bowel dilatation to suggest obstruction. There is moderate stool in the right colon with small volume of colonic stool distally. Prior aortic stent graft repair of aneurysm. Multiple pelvic surgical clips. Prior cholecystectomy. Rib costochondral cartilage calcification. Postsurgical change in the lumbar spine. IMPRESSION: Nonobstructive bowel gas pattern. Moderate stool in the right colon. Electronically Signed   By: Keith Rake M.D.   On: 02/08/2022 16:08   DG Foot Complete Right  Result Date: 02/06/2022 CLINICAL DATA:  Right foot pain after fall EXAM: RIGHT FOOT COMPLETE - 3+ VIEW COMPARISON:  None Available. FINDINGS: Diffuse osseous demineralization. There appear to be nondisplaced fractures involving the fourth and fifth metatarsal necks. No additional fractures are seen. No dislocation. Overall mild degenerative changes of  the foot. Extensive atherosclerotic vascular calcifications. No focal soft tissue swelling. IMPRESSION: Suspected nondisplaced fractures involving the fourth and fifth metatarsal necks. Correlate with point tenderness. Electronically Signed   By: Davina Poke D.O.   On: 02/06/2022 16:26   DG Pelvis 1-2 Views  Result Date: 02/06/2022 CLINICAL DATA:  Right hip pain. EXAM: PELVIS - 1-2 VIEW COMPARISON:  Pelvic/right hip radiographs 02/05/2022 FINDINGS: Sequelae of right hip arthroplasty are again identified. A mildly displaced, comminuted periprosthetic fracture of the proximal right femur involving the subtrochanteric and likely intertrochanteric regions is unchanged from yesterday's study. Prior plate and screw fixation of the more distal right femur and prior lumbar fusion are partially visualized. Extensive atherosclerotic vascular calcifications, an aortoiliac stent graft, and numerous pelvic and lower abdominal surgical clips are present. IMPRESSION: Unchanged periprosthetic fracture of the proximal right femur. Electronically Signed   By: Logan Bores M.D.   On: 02/06/2022 10:42   DG FEMUR, MIN 2 VIEWS RIGHT  Result Date: 02/06/2022 CLINICAL DATA:  Right hip pain. EXAM: RIGHT FEMUR 2 VIEWS COMPARISON:  Right hip radiographs 02/05/2022 FINDINGS: Sequelae of right hip arthroplasty are again noted. A mildly displaced, comminuted fracture of the proximal subtrochanteric/intertrochanteric region around the stem of the femoral prosthesis is unchanged from  yesterday's study. Previous plate and screw fixation of a remote, healed fracture of the more distal femur is noted. The hip and knee are located. There is chondrocalcinosis in the medial and lateral compartments of the knee. Extensive atherosclerotic vascular calcifications are noted. IMPRESSION: Unchanged periprosthetic fracture of the proximal subtrochanteric/intertrochanteric right femur. Electronically Signed   By: Logan Bores M.D.   On: 02/06/2022  10:36   (Echo, Carotid, EGD, Colonoscopy, ERCP)    Subjective: No complaints  Discharge Exam: Vitals:   02/11/22 2141 02/12/22 0731  BP: 111/73 119/77  Pulse: 88 90  Resp: 16   Temp: 98.1 F (36.7 C) 98 F (36.7 C)  SpO2: 96% 98%   Vitals:   02/11/22 1454 02/11/22 1457 02/11/22 2141 02/12/22 0731  BP:  104/76 111/73 119/77  Pulse:  86 88 90  Resp:   16   Temp:   98.1 F (36.7 C) 98 F (36.7 C)  TempSrc:   Oral Oral  SpO2:  95% 96% 98%  Weight: 52.2 kg     Height: '5\' 1"'$  (1.549 m)       General: Pt is alert, awake, not in acute distress Cardiovascular: RRR, S1/S2 +, no rubs, no gallops Respiratory: CTA bilaterally, no wheezing, no rhonchi Abdominal: Soft, NT, ND, bowel sounds + Extremities: no edema, no cyanosis    The results of significant diagnostics from this hospitalization (including imaging, microbiology, ancillary and laboratory) are listed below for reference.     Microbiology: No results found for this or any previous visit (from the past 240 hour(s)).   Labs: BNP (last 3 results) Recent Labs    05/05/21 1326 11/03/21 0633 01/11/22 1548  BNP 1,059.0* 697.0* 702.6*   Basic Metabolic Panel: Recent Labs  Lab 02/05/22 2050  NA 138  K 4.3  CL 100  CO2 24  GLUCOSE 136*  BUN 13  CREATININE 1.03*  CALCIUM 9.6  MG 1.7   Liver Function Tests: Recent Labs  Lab 02/05/22 2050  AST 22  ALT 15  ALKPHOS 69  BILITOT 1.1  PROT 6.0*  ALBUMIN 3.4*   No results for input(s): "LIPASE", "AMYLASE" in the last 168 hours. No results for input(s): "AMMONIA" in the last 168 hours. CBC: Recent Labs  Lab 02/05/22 2050  WBC 13.3*  HGB 11.8*  HCT 36.0  MCV 91.4  PLT 227   Cardiac Enzymes: No results for input(s): "CKTOTAL", "CKMB", "CKMBINDEX", "TROPONINI" in the last 168 hours. BNP: Invalid input(s): "POCBNP" CBG: Recent Labs  Lab 02/08/22 1345  GLUCAP 226*   D-Dimer No results for input(s): "DDIMER" in the last 72 hours. Hgb A1c No  results for input(s): "HGBA1C" in the last 72 hours. Lipid Profile No results for input(s): "CHOL", "HDL", "LDLCALC", "TRIG", "CHOLHDL", "LDLDIRECT" in the last 72 hours. Thyroid function studies No results for input(s): "TSH", "T4TOTAL", "T3FREE", "THYROIDAB" in the last 72 hours.  Invalid input(s): "FREET3" Anemia work up No results for input(s): "VITAMINB12", "FOLATE", "FERRITIN", "TIBC", "IRON", "RETICCTPCT" in the last 72 hours. Urinalysis    Component Value Date/Time   COLORURINE STRAW (A) 01/11/2022 1622   APPEARANCEUR CLEAR 01/11/2022 1622   LABSPEC 1.005 01/11/2022 1622   PHURINE 7.0 01/11/2022 1622   GLUCOSEU NEGATIVE 01/11/2022 1622   HGBUR NEGATIVE 01/11/2022 1622   BILIRUBINUR NEGATIVE 01/11/2022 1622   KETONESUR NEGATIVE 01/11/2022 1622   PROTEINUR NEGATIVE 01/11/2022 1622   UROBILINOGEN 0.2 01/18/2015 2010   NITRITE NEGATIVE 01/11/2022 1622   LEUKOCYTESUR NEGATIVE 01/11/2022 1622   Sepsis Labs Recent  Labs  Lab 02/05/22 2050  WBC 13.3*   Microbiology No results found for this or any previous visit (from the past 240 hour(s)).   Time coordinating discharge: Over 30 minutes  SIGNED:   Charlynne Cousins, MD  [facility] 02/12/2022, 9:45 AM Pager   If 7PM-7AM, please contact night-coverage [email or web address] Password [email or web address]

## 2022-02-12 NOTE — Plan of Care (Signed)
?  Problem: Education: ?Goal: Knowledge of General Education information will improve ?Description: Including pain rating scale, medication(s)/side effects and non-pharmacologic comfort measures ?Outcome: Progressing ?  ?Problem: Health Behavior/Discharge Planning: ?Goal: Ability to manage health-related needs will improve ?Outcome: Progressing ?  ?Problem: Activity: ?Goal: Risk for activity intolerance will decrease ?Outcome: Progressing ?  ?Problem: Nutrition: ?Goal: Adequate nutrition will be maintained ?Outcome: Progressing ?  ?Problem: Skin Integrity: ?Goal: Risk for impaired skin integrity will decrease ?Outcome: Progressing ?  ?

## 2022-02-12 NOTE — Progress Notes (Signed)
Called Dustin Flock to give report to [place] RN [phone]

## 2022-02-13 DIAGNOSIS — I4891 Unspecified atrial fibrillation: Secondary | ICD-10-CM | POA: Diagnosis not present

## 2022-02-13 DIAGNOSIS — S72009A Fracture of unspecified part of neck of unspecified femur, initial encounter for closed fracture: Secondary | ICD-10-CM | POA: Diagnosis not present

## 2022-02-13 DIAGNOSIS — F32A Depression, unspecified: Secondary | ICD-10-CM | POA: Diagnosis not present

## 2022-02-13 DIAGNOSIS — I10 Essential (primary) hypertension: Secondary | ICD-10-CM | POA: Diagnosis not present

## 2022-02-13 DIAGNOSIS — K219 Gastro-esophageal reflux disease without esophagitis: Secondary | ICD-10-CM | POA: Diagnosis not present

## 2022-02-14 DIAGNOSIS — S72009A Fracture of unspecified part of neck of unspecified femur, initial encounter for closed fracture: Secondary | ICD-10-CM | POA: Diagnosis not present

## 2022-02-14 DIAGNOSIS — I4891 Unspecified atrial fibrillation: Secondary | ICD-10-CM | POA: Diagnosis not present

## 2022-02-14 DIAGNOSIS — I10 Essential (primary) hypertension: Secondary | ICD-10-CM | POA: Diagnosis not present

## 2022-02-14 DIAGNOSIS — K219 Gastro-esophageal reflux disease without esophagitis: Secondary | ICD-10-CM | POA: Diagnosis not present

## 2022-02-14 DIAGNOSIS — F32A Depression, unspecified: Secondary | ICD-10-CM | POA: Diagnosis not present

## 2022-02-21 DIAGNOSIS — K219 Gastro-esophageal reflux disease without esophagitis: Secondary | ICD-10-CM | POA: Diagnosis not present

## 2022-02-21 DIAGNOSIS — F32A Depression, unspecified: Secondary | ICD-10-CM | POA: Diagnosis not present

## 2022-02-21 DIAGNOSIS — I10 Essential (primary) hypertension: Secondary | ICD-10-CM | POA: Diagnosis not present

## 2022-02-21 DIAGNOSIS — S72009A Fracture of unspecified part of neck of unspecified femur, initial encounter for closed fracture: Secondary | ICD-10-CM | POA: Diagnosis not present

## 2022-02-21 DIAGNOSIS — I4891 Unspecified atrial fibrillation: Secondary | ICD-10-CM | POA: Diagnosis not present

## 2022-03-10 DIAGNOSIS — M7989 Other specified soft tissue disorders: Secondary | ICD-10-CM | POA: Diagnosis not present

## 2022-03-11 DIAGNOSIS — S7291XA Unspecified fracture of right femur, initial encounter for closed fracture: Secondary | ICD-10-CM | POA: Diagnosis not present

## 2022-03-13 DIAGNOSIS — R040 Epistaxis: Secondary | ICD-10-CM | POA: Diagnosis not present

## 2022-03-28 DIAGNOSIS — S92344D Nondisplaced fracture of fourth metatarsal bone, right foot, subsequent encounter for fracture with routine healing: Secondary | ICD-10-CM | POA: Diagnosis not present

## 2022-03-28 DIAGNOSIS — N189 Chronic kidney disease, unspecified: Secondary | ICD-10-CM | POA: Diagnosis not present

## 2022-03-28 DIAGNOSIS — I160 Hypertensive urgency: Secondary | ICD-10-CM | POA: Diagnosis not present

## 2022-03-28 DIAGNOSIS — I5043 Acute on chronic combined systolic (congestive) and diastolic (congestive) heart failure: Secondary | ICD-10-CM | POA: Diagnosis not present

## 2022-03-28 DIAGNOSIS — E119 Type 2 diabetes mellitus without complications: Secondary | ICD-10-CM | POA: Diagnosis not present

## 2022-03-28 DIAGNOSIS — Z4789 Encounter for other orthopedic aftercare: Secondary | ICD-10-CM | POA: Diagnosis not present

## 2022-03-28 DIAGNOSIS — I480 Paroxysmal atrial fibrillation: Secondary | ICD-10-CM | POA: Diagnosis not present

## 2022-03-28 DIAGNOSIS — I110 Hypertensive heart disease with heart failure: Secondary | ICD-10-CM | POA: Diagnosis not present

## 2022-03-28 DIAGNOSIS — I129 Hypertensive chronic kidney disease with stage 1 through stage 4 chronic kidney disease, or unspecified chronic kidney disease: Secondary | ICD-10-CM | POA: Diagnosis not present

## 2022-03-28 DIAGNOSIS — S92355D Nondisplaced fracture of fifth metatarsal bone, left foot, subsequent encounter for fracture with routine healing: Secondary | ICD-10-CM | POA: Diagnosis not present

## 2022-03-28 DIAGNOSIS — M9701XD Periprosthetic fracture around internal prosthetic right hip joint, subsequent encounter: Secondary | ICD-10-CM | POA: Diagnosis not present

## 2022-04-01 DIAGNOSIS — I110 Hypertensive heart disease with heart failure: Secondary | ICD-10-CM | POA: Diagnosis not present

## 2022-04-01 DIAGNOSIS — M9701XD Periprosthetic fracture around internal prosthetic right hip joint, subsequent encounter: Secondary | ICD-10-CM | POA: Diagnosis not present

## 2022-04-01 DIAGNOSIS — E119 Type 2 diabetes mellitus without complications: Secondary | ICD-10-CM | POA: Diagnosis not present

## 2022-04-01 DIAGNOSIS — I129 Hypertensive chronic kidney disease with stage 1 through stage 4 chronic kidney disease, or unspecified chronic kidney disease: Secondary | ICD-10-CM | POA: Diagnosis not present

## 2022-04-01 DIAGNOSIS — Z4789 Encounter for other orthopedic aftercare: Secondary | ICD-10-CM | POA: Diagnosis not present

## 2022-04-01 DIAGNOSIS — I480 Paroxysmal atrial fibrillation: Secondary | ICD-10-CM | POA: Diagnosis not present

## 2022-04-01 DIAGNOSIS — I160 Hypertensive urgency: Secondary | ICD-10-CM | POA: Diagnosis not present

## 2022-04-01 DIAGNOSIS — N189 Chronic kidney disease, unspecified: Secondary | ICD-10-CM | POA: Diagnosis not present

## 2022-04-01 DIAGNOSIS — S92344D Nondisplaced fracture of fourth metatarsal bone, right foot, subsequent encounter for fracture with routine healing: Secondary | ICD-10-CM | POA: Diagnosis not present

## 2022-04-01 DIAGNOSIS — S92355D Nondisplaced fracture of fifth metatarsal bone, left foot, subsequent encounter for fracture with routine healing: Secondary | ICD-10-CM | POA: Diagnosis not present

## 2022-04-01 DIAGNOSIS — I5043 Acute on chronic combined systolic (congestive) and diastolic (congestive) heart failure: Secondary | ICD-10-CM | POA: Diagnosis not present

## 2022-04-02 DIAGNOSIS — N189 Chronic kidney disease, unspecified: Secondary | ICD-10-CM | POA: Diagnosis not present

## 2022-04-02 DIAGNOSIS — I5043 Acute on chronic combined systolic (congestive) and diastolic (congestive) heart failure: Secondary | ICD-10-CM | POA: Diagnosis not present

## 2022-04-02 DIAGNOSIS — E119 Type 2 diabetes mellitus without complications: Secondary | ICD-10-CM | POA: Diagnosis not present

## 2022-04-02 DIAGNOSIS — I110 Hypertensive heart disease with heart failure: Secondary | ICD-10-CM | POA: Diagnosis not present

## 2022-04-02 DIAGNOSIS — I129 Hypertensive chronic kidney disease with stage 1 through stage 4 chronic kidney disease, or unspecified chronic kidney disease: Secondary | ICD-10-CM | POA: Diagnosis not present

## 2022-04-02 DIAGNOSIS — S92344D Nondisplaced fracture of fourth metatarsal bone, right foot, subsequent encounter for fracture with routine healing: Secondary | ICD-10-CM | POA: Diagnosis not present

## 2022-04-02 DIAGNOSIS — I160 Hypertensive urgency: Secondary | ICD-10-CM | POA: Diagnosis not present

## 2022-04-02 DIAGNOSIS — Z4789 Encounter for other orthopedic aftercare: Secondary | ICD-10-CM | POA: Diagnosis not present

## 2022-04-02 DIAGNOSIS — I480 Paroxysmal atrial fibrillation: Secondary | ICD-10-CM | POA: Diagnosis not present

## 2022-04-02 DIAGNOSIS — M9701XD Periprosthetic fracture around internal prosthetic right hip joint, subsequent encounter: Secondary | ICD-10-CM | POA: Diagnosis not present

## 2022-04-02 DIAGNOSIS — S92355D Nondisplaced fracture of fifth metatarsal bone, left foot, subsequent encounter for fracture with routine healing: Secondary | ICD-10-CM | POA: Diagnosis not present

## 2022-04-03 DIAGNOSIS — I160 Hypertensive urgency: Secondary | ICD-10-CM | POA: Diagnosis not present

## 2022-04-03 DIAGNOSIS — R040 Epistaxis: Secondary | ICD-10-CM | POA: Diagnosis not present

## 2022-04-03 DIAGNOSIS — I5043 Acute on chronic combined systolic (congestive) and diastolic (congestive) heart failure: Secondary | ICD-10-CM | POA: Diagnosis not present

## 2022-04-03 DIAGNOSIS — S92355D Nondisplaced fracture of fifth metatarsal bone, left foot, subsequent encounter for fracture with routine healing: Secondary | ICD-10-CM | POA: Diagnosis not present

## 2022-04-03 DIAGNOSIS — S92344D Nondisplaced fracture of fourth metatarsal bone, right foot, subsequent encounter for fracture with routine healing: Secondary | ICD-10-CM | POA: Diagnosis not present

## 2022-04-03 DIAGNOSIS — I110 Hypertensive heart disease with heart failure: Secondary | ICD-10-CM | POA: Diagnosis not present

## 2022-04-03 DIAGNOSIS — I10 Essential (primary) hypertension: Secondary | ICD-10-CM | POA: Diagnosis not present

## 2022-04-03 DIAGNOSIS — I480 Paroxysmal atrial fibrillation: Secondary | ICD-10-CM | POA: Diagnosis not present

## 2022-04-03 DIAGNOSIS — E119 Type 2 diabetes mellitus without complications: Secondary | ICD-10-CM | POA: Diagnosis not present

## 2022-04-03 DIAGNOSIS — S72009A Fracture of unspecified part of neck of unspecified femur, initial encounter for closed fracture: Secondary | ICD-10-CM | POA: Diagnosis not present

## 2022-04-03 DIAGNOSIS — F32A Depression, unspecified: Secondary | ICD-10-CM | POA: Diagnosis not present

## 2022-04-03 DIAGNOSIS — I129 Hypertensive chronic kidney disease with stage 1 through stage 4 chronic kidney disease, or unspecified chronic kidney disease: Secondary | ICD-10-CM | POA: Diagnosis not present

## 2022-04-03 DIAGNOSIS — I4891 Unspecified atrial fibrillation: Secondary | ICD-10-CM | POA: Diagnosis not present

## 2022-04-03 DIAGNOSIS — Z4789 Encounter for other orthopedic aftercare: Secondary | ICD-10-CM | POA: Diagnosis not present

## 2022-04-03 DIAGNOSIS — M9701XD Periprosthetic fracture around internal prosthetic right hip joint, subsequent encounter: Secondary | ICD-10-CM | POA: Diagnosis not present

## 2022-04-03 DIAGNOSIS — K219 Gastro-esophageal reflux disease without esophagitis: Secondary | ICD-10-CM | POA: Diagnosis not present

## 2022-04-03 DIAGNOSIS — N189 Chronic kidney disease, unspecified: Secondary | ICD-10-CM | POA: Diagnosis not present

## 2022-04-04 DIAGNOSIS — I480 Paroxysmal atrial fibrillation: Secondary | ICD-10-CM | POA: Diagnosis not present

## 2022-04-04 DIAGNOSIS — E119 Type 2 diabetes mellitus without complications: Secondary | ICD-10-CM | POA: Diagnosis not present

## 2022-04-04 DIAGNOSIS — I110 Hypertensive heart disease with heart failure: Secondary | ICD-10-CM | POA: Diagnosis not present

## 2022-04-04 DIAGNOSIS — S92344D Nondisplaced fracture of fourth metatarsal bone, right foot, subsequent encounter for fracture with routine healing: Secondary | ICD-10-CM | POA: Diagnosis not present

## 2022-04-04 DIAGNOSIS — I160 Hypertensive urgency: Secondary | ICD-10-CM | POA: Diagnosis not present

## 2022-04-04 DIAGNOSIS — Z4789 Encounter for other orthopedic aftercare: Secondary | ICD-10-CM | POA: Diagnosis not present

## 2022-04-04 DIAGNOSIS — N189 Chronic kidney disease, unspecified: Secondary | ICD-10-CM | POA: Diagnosis not present

## 2022-04-04 DIAGNOSIS — M9701XD Periprosthetic fracture around internal prosthetic right hip joint, subsequent encounter: Secondary | ICD-10-CM | POA: Diagnosis not present

## 2022-04-04 DIAGNOSIS — I5043 Acute on chronic combined systolic (congestive) and diastolic (congestive) heart failure: Secondary | ICD-10-CM | POA: Diagnosis not present

## 2022-04-04 DIAGNOSIS — S92355D Nondisplaced fracture of fifth metatarsal bone, left foot, subsequent encounter for fracture with routine healing: Secondary | ICD-10-CM | POA: Diagnosis not present

## 2022-04-04 DIAGNOSIS — I129 Hypertensive chronic kidney disease with stage 1 through stage 4 chronic kidney disease, or unspecified chronic kidney disease: Secondary | ICD-10-CM | POA: Diagnosis not present

## 2022-04-06 DIAGNOSIS — S92344D Nondisplaced fracture of fourth metatarsal bone, right foot, subsequent encounter for fracture with routine healing: Secondary | ICD-10-CM | POA: Diagnosis not present

## 2022-04-06 DIAGNOSIS — E119 Type 2 diabetes mellitus without complications: Secondary | ICD-10-CM | POA: Diagnosis not present

## 2022-04-06 DIAGNOSIS — I5043 Acute on chronic combined systolic (congestive) and diastolic (congestive) heart failure: Secondary | ICD-10-CM | POA: Diagnosis not present

## 2022-04-06 DIAGNOSIS — I110 Hypertensive heart disease with heart failure: Secondary | ICD-10-CM | POA: Diagnosis not present

## 2022-04-06 DIAGNOSIS — I129 Hypertensive chronic kidney disease with stage 1 through stage 4 chronic kidney disease, or unspecified chronic kidney disease: Secondary | ICD-10-CM | POA: Diagnosis not present

## 2022-04-06 DIAGNOSIS — N189 Chronic kidney disease, unspecified: Secondary | ICD-10-CM | POA: Diagnosis not present

## 2022-04-06 DIAGNOSIS — I160 Hypertensive urgency: Secondary | ICD-10-CM | POA: Diagnosis not present

## 2022-04-06 DIAGNOSIS — Z4789 Encounter for other orthopedic aftercare: Secondary | ICD-10-CM | POA: Diagnosis not present

## 2022-04-06 DIAGNOSIS — S92355D Nondisplaced fracture of fifth metatarsal bone, left foot, subsequent encounter for fracture with routine healing: Secondary | ICD-10-CM | POA: Diagnosis not present

## 2022-04-06 DIAGNOSIS — I480 Paroxysmal atrial fibrillation: Secondary | ICD-10-CM | POA: Diagnosis not present

## 2022-04-06 DIAGNOSIS — M9701XD Periprosthetic fracture around internal prosthetic right hip joint, subsequent encounter: Secondary | ICD-10-CM | POA: Diagnosis not present

## 2022-04-07 DIAGNOSIS — I639 Cerebral infarction, unspecified: Secondary | ICD-10-CM | POA: Diagnosis not present

## 2022-04-07 DIAGNOSIS — E119 Type 2 diabetes mellitus without complications: Secondary | ICD-10-CM | POA: Diagnosis not present

## 2022-04-07 DIAGNOSIS — L89610 Pressure ulcer of right heel, unstageable: Secondary | ICD-10-CM | POA: Diagnosis not present

## 2022-04-07 DIAGNOSIS — I509 Heart failure, unspecified: Secondary | ICD-10-CM | POA: Diagnosis not present

## 2022-04-08 DIAGNOSIS — E119 Type 2 diabetes mellitus without complications: Secondary | ICD-10-CM | POA: Diagnosis not present

## 2022-04-08 DIAGNOSIS — I5043 Acute on chronic combined systolic (congestive) and diastolic (congestive) heart failure: Secondary | ICD-10-CM | POA: Diagnosis not present

## 2022-04-08 DIAGNOSIS — I160 Hypertensive urgency: Secondary | ICD-10-CM | POA: Diagnosis not present

## 2022-04-08 DIAGNOSIS — M6281 Muscle weakness (generalized): Secondary | ICD-10-CM | POA: Diagnosis not present

## 2022-04-08 DIAGNOSIS — S7291XD Unspecified fracture of right femur, subsequent encounter for closed fracture with routine healing: Secondary | ICD-10-CM | POA: Diagnosis not present

## 2022-04-08 DIAGNOSIS — I110 Hypertensive heart disease with heart failure: Secondary | ICD-10-CM | POA: Diagnosis not present

## 2022-04-08 DIAGNOSIS — S92355D Nondisplaced fracture of fifth metatarsal bone, left foot, subsequent encounter for fracture with routine healing: Secondary | ICD-10-CM | POA: Diagnosis not present

## 2022-04-08 DIAGNOSIS — M9701XD Periprosthetic fracture around internal prosthetic right hip joint, subsequent encounter: Secondary | ICD-10-CM | POA: Diagnosis not present

## 2022-04-08 DIAGNOSIS — S92344D Nondisplaced fracture of fourth metatarsal bone, right foot, subsequent encounter for fracture with routine healing: Secondary | ICD-10-CM | POA: Diagnosis not present

## 2022-04-08 DIAGNOSIS — Z4789 Encounter for other orthopedic aftercare: Secondary | ICD-10-CM | POA: Diagnosis not present

## 2022-04-09 DIAGNOSIS — E119 Type 2 diabetes mellitus without complications: Secondary | ICD-10-CM | POA: Diagnosis not present

## 2022-04-09 DIAGNOSIS — I5043 Acute on chronic combined systolic (congestive) and diastolic (congestive) heart failure: Secondary | ICD-10-CM | POA: Diagnosis not present

## 2022-04-09 DIAGNOSIS — I110 Hypertensive heart disease with heart failure: Secondary | ICD-10-CM | POA: Diagnosis not present

## 2022-04-09 DIAGNOSIS — S92355D Nondisplaced fracture of fifth metatarsal bone, left foot, subsequent encounter for fracture with routine healing: Secondary | ICD-10-CM | POA: Diagnosis not present

## 2022-04-09 DIAGNOSIS — M9701XD Periprosthetic fracture around internal prosthetic right hip joint, subsequent encounter: Secondary | ICD-10-CM | POA: Diagnosis not present

## 2022-04-09 DIAGNOSIS — M6281 Muscle weakness (generalized): Secondary | ICD-10-CM | POA: Diagnosis not present

## 2022-04-09 DIAGNOSIS — S92344D Nondisplaced fracture of fourth metatarsal bone, right foot, subsequent encounter for fracture with routine healing: Secondary | ICD-10-CM | POA: Diagnosis not present

## 2022-04-09 DIAGNOSIS — I160 Hypertensive urgency: Secondary | ICD-10-CM | POA: Diagnosis not present

## 2022-04-09 DIAGNOSIS — Z4789 Encounter for other orthopedic aftercare: Secondary | ICD-10-CM | POA: Diagnosis not present

## 2022-04-10 DIAGNOSIS — S92355D Nondisplaced fracture of fifth metatarsal bone, left foot, subsequent encounter for fracture with routine healing: Secondary | ICD-10-CM | POA: Diagnosis not present

## 2022-04-10 DIAGNOSIS — M9701XD Periprosthetic fracture around internal prosthetic right hip joint, subsequent encounter: Secondary | ICD-10-CM | POA: Diagnosis not present

## 2022-04-10 DIAGNOSIS — I110 Hypertensive heart disease with heart failure: Secondary | ICD-10-CM | POA: Diagnosis not present

## 2022-04-10 DIAGNOSIS — I160 Hypertensive urgency: Secondary | ICD-10-CM | POA: Diagnosis not present

## 2022-04-10 DIAGNOSIS — I5043 Acute on chronic combined systolic (congestive) and diastolic (congestive) heart failure: Secondary | ICD-10-CM | POA: Diagnosis not present

## 2022-04-10 DIAGNOSIS — Z4789 Encounter for other orthopedic aftercare: Secondary | ICD-10-CM | POA: Diagnosis not present

## 2022-04-10 DIAGNOSIS — M6281 Muscle weakness (generalized): Secondary | ICD-10-CM | POA: Diagnosis not present

## 2022-04-10 DIAGNOSIS — S92344D Nondisplaced fracture of fourth metatarsal bone, right foot, subsequent encounter for fracture with routine healing: Secondary | ICD-10-CM | POA: Diagnosis not present

## 2022-04-10 DIAGNOSIS — E119 Type 2 diabetes mellitus without complications: Secondary | ICD-10-CM | POA: Diagnosis not present

## 2022-04-11 DIAGNOSIS — S92355D Nondisplaced fracture of fifth metatarsal bone, left foot, subsequent encounter for fracture with routine healing: Secondary | ICD-10-CM | POA: Diagnosis not present

## 2022-04-11 DIAGNOSIS — L89610 Pressure ulcer of right heel, unstageable: Secondary | ICD-10-CM | POA: Diagnosis not present

## 2022-04-11 DIAGNOSIS — I160 Hypertensive urgency: Secondary | ICD-10-CM | POA: Diagnosis not present

## 2022-04-11 DIAGNOSIS — Z4789 Encounter for other orthopedic aftercare: Secondary | ICD-10-CM | POA: Diagnosis not present

## 2022-04-11 DIAGNOSIS — I5043 Acute on chronic combined systolic (congestive) and diastolic (congestive) heart failure: Secondary | ICD-10-CM | POA: Diagnosis not present

## 2022-04-11 DIAGNOSIS — I110 Hypertensive heart disease with heart failure: Secondary | ICD-10-CM | POA: Diagnosis not present

## 2022-04-11 DIAGNOSIS — M9701XD Periprosthetic fracture around internal prosthetic right hip joint, subsequent encounter: Secondary | ICD-10-CM | POA: Diagnosis not present

## 2022-04-11 DIAGNOSIS — E119 Type 2 diabetes mellitus without complications: Secondary | ICD-10-CM | POA: Diagnosis not present

## 2022-04-11 DIAGNOSIS — M6281 Muscle weakness (generalized): Secondary | ICD-10-CM | POA: Diagnosis not present

## 2022-04-11 DIAGNOSIS — S92344D Nondisplaced fracture of fourth metatarsal bone, right foot, subsequent encounter for fracture with routine healing: Secondary | ICD-10-CM | POA: Diagnosis not present

## 2022-04-14 DIAGNOSIS — I5043 Acute on chronic combined systolic (congestive) and diastolic (congestive) heart failure: Secondary | ICD-10-CM | POA: Diagnosis not present

## 2022-04-14 DIAGNOSIS — I639 Cerebral infarction, unspecified: Secondary | ICD-10-CM | POA: Diagnosis not present

## 2022-04-14 DIAGNOSIS — I160 Hypertensive urgency: Secondary | ICD-10-CM | POA: Diagnosis not present

## 2022-04-14 DIAGNOSIS — I509 Heart failure, unspecified: Secondary | ICD-10-CM | POA: Diagnosis not present

## 2022-04-14 DIAGNOSIS — Z4789 Encounter for other orthopedic aftercare: Secondary | ICD-10-CM | POA: Diagnosis not present

## 2022-04-14 DIAGNOSIS — S92355D Nondisplaced fracture of fifth metatarsal bone, left foot, subsequent encounter for fracture with routine healing: Secondary | ICD-10-CM | POA: Diagnosis not present

## 2022-04-14 DIAGNOSIS — E119 Type 2 diabetes mellitus without complications: Secondary | ICD-10-CM | POA: Diagnosis not present

## 2022-04-14 DIAGNOSIS — M6281 Muscle weakness (generalized): Secondary | ICD-10-CM | POA: Diagnosis not present

## 2022-04-14 DIAGNOSIS — S92344D Nondisplaced fracture of fourth metatarsal bone, right foot, subsequent encounter for fracture with routine healing: Secondary | ICD-10-CM | POA: Diagnosis not present

## 2022-04-14 DIAGNOSIS — I110 Hypertensive heart disease with heart failure: Secondary | ICD-10-CM | POA: Diagnosis not present

## 2022-04-14 DIAGNOSIS — L89610 Pressure ulcer of right heel, unstageable: Secondary | ICD-10-CM | POA: Diagnosis not present

## 2022-04-14 DIAGNOSIS — M9701XD Periprosthetic fracture around internal prosthetic right hip joint, subsequent encounter: Secondary | ICD-10-CM | POA: Diagnosis not present

## 2022-04-15 DIAGNOSIS — U071 COVID-19: Secondary | ICD-10-CM | POA: Diagnosis not present

## 2022-04-15 DIAGNOSIS — S92355D Nondisplaced fracture of fifth metatarsal bone, left foot, subsequent encounter for fracture with routine healing: Secondary | ICD-10-CM | POA: Diagnosis not present

## 2022-04-15 DIAGNOSIS — S92344D Nondisplaced fracture of fourth metatarsal bone, right foot, subsequent encounter for fracture with routine healing: Secondary | ICD-10-CM | POA: Diagnosis not present

## 2022-04-15 DIAGNOSIS — I5043 Acute on chronic combined systolic (congestive) and diastolic (congestive) heart failure: Secondary | ICD-10-CM | POA: Diagnosis not present

## 2022-04-15 DIAGNOSIS — R059 Cough, unspecified: Secondary | ICD-10-CM | POA: Diagnosis not present

## 2022-04-15 DIAGNOSIS — Z4789 Encounter for other orthopedic aftercare: Secondary | ICD-10-CM | POA: Diagnosis not present

## 2022-04-15 DIAGNOSIS — M9701XD Periprosthetic fracture around internal prosthetic right hip joint, subsequent encounter: Secondary | ICD-10-CM | POA: Diagnosis not present

## 2022-04-15 DIAGNOSIS — M6281 Muscle weakness (generalized): Secondary | ICD-10-CM | POA: Diagnosis not present

## 2022-04-15 DIAGNOSIS — I10 Essential (primary) hypertension: Secondary | ICD-10-CM | POA: Diagnosis not present

## 2022-04-15 DIAGNOSIS — E119 Type 2 diabetes mellitus without complications: Secondary | ICD-10-CM | POA: Diagnosis not present

## 2022-04-15 DIAGNOSIS — I110 Hypertensive heart disease with heart failure: Secondary | ICD-10-CM | POA: Diagnosis not present

## 2022-04-15 DIAGNOSIS — I160 Hypertensive urgency: Secondary | ICD-10-CM | POA: Diagnosis not present

## 2022-04-16 DIAGNOSIS — M6281 Muscle weakness (generalized): Secondary | ICD-10-CM | POA: Diagnosis not present

## 2022-04-16 DIAGNOSIS — Z4789 Encounter for other orthopedic aftercare: Secondary | ICD-10-CM | POA: Diagnosis not present

## 2022-04-16 DIAGNOSIS — I110 Hypertensive heart disease with heart failure: Secondary | ICD-10-CM | POA: Diagnosis not present

## 2022-04-16 DIAGNOSIS — E119 Type 2 diabetes mellitus without complications: Secondary | ICD-10-CM | POA: Diagnosis not present

## 2022-04-16 DIAGNOSIS — I5043 Acute on chronic combined systolic (congestive) and diastolic (congestive) heart failure: Secondary | ICD-10-CM | POA: Diagnosis not present

## 2022-04-16 DIAGNOSIS — S92355D Nondisplaced fracture of fifth metatarsal bone, left foot, subsequent encounter for fracture with routine healing: Secondary | ICD-10-CM | POA: Diagnosis not present

## 2022-04-16 DIAGNOSIS — M9701XD Periprosthetic fracture around internal prosthetic right hip joint, subsequent encounter: Secondary | ICD-10-CM | POA: Diagnosis not present

## 2022-04-16 DIAGNOSIS — I160 Hypertensive urgency: Secondary | ICD-10-CM | POA: Diagnosis not present

## 2022-04-16 DIAGNOSIS — S92344D Nondisplaced fracture of fourth metatarsal bone, right foot, subsequent encounter for fracture with routine healing: Secondary | ICD-10-CM | POA: Diagnosis not present

## 2022-04-17 DIAGNOSIS — I110 Hypertensive heart disease with heart failure: Secondary | ICD-10-CM | POA: Diagnosis not present

## 2022-04-17 DIAGNOSIS — M9701XD Periprosthetic fracture around internal prosthetic right hip joint, subsequent encounter: Secondary | ICD-10-CM | POA: Diagnosis not present

## 2022-04-17 DIAGNOSIS — M6281 Muscle weakness (generalized): Secondary | ICD-10-CM | POA: Diagnosis not present

## 2022-04-17 DIAGNOSIS — S92355D Nondisplaced fracture of fifth metatarsal bone, left foot, subsequent encounter for fracture with routine healing: Secondary | ICD-10-CM | POA: Diagnosis not present

## 2022-04-17 DIAGNOSIS — E119 Type 2 diabetes mellitus without complications: Secondary | ICD-10-CM | POA: Diagnosis not present

## 2022-04-17 DIAGNOSIS — I160 Hypertensive urgency: Secondary | ICD-10-CM | POA: Diagnosis not present

## 2022-04-17 DIAGNOSIS — I5043 Acute on chronic combined systolic (congestive) and diastolic (congestive) heart failure: Secondary | ICD-10-CM | POA: Diagnosis not present

## 2022-04-17 DIAGNOSIS — S92344D Nondisplaced fracture of fourth metatarsal bone, right foot, subsequent encounter for fracture with routine healing: Secondary | ICD-10-CM | POA: Diagnosis not present

## 2022-04-17 DIAGNOSIS — Z4789 Encounter for other orthopedic aftercare: Secondary | ICD-10-CM | POA: Diagnosis not present

## 2022-04-18 DIAGNOSIS — I5043 Acute on chronic combined systolic (congestive) and diastolic (congestive) heart failure: Secondary | ICD-10-CM | POA: Diagnosis not present

## 2022-04-18 DIAGNOSIS — Z4789 Encounter for other orthopedic aftercare: Secondary | ICD-10-CM | POA: Diagnosis not present

## 2022-04-18 DIAGNOSIS — I160 Hypertensive urgency: Secondary | ICD-10-CM | POA: Diagnosis not present

## 2022-04-18 DIAGNOSIS — I110 Hypertensive heart disease with heart failure: Secondary | ICD-10-CM | POA: Diagnosis not present

## 2022-04-18 DIAGNOSIS — S92344D Nondisplaced fracture of fourth metatarsal bone, right foot, subsequent encounter for fracture with routine healing: Secondary | ICD-10-CM | POA: Diagnosis not present

## 2022-04-18 DIAGNOSIS — M6281 Muscle weakness (generalized): Secondary | ICD-10-CM | POA: Diagnosis not present

## 2022-04-18 DIAGNOSIS — S92355D Nondisplaced fracture of fifth metatarsal bone, left foot, subsequent encounter for fracture with routine healing: Secondary | ICD-10-CM | POA: Diagnosis not present

## 2022-04-18 DIAGNOSIS — M9701XD Periprosthetic fracture around internal prosthetic right hip joint, subsequent encounter: Secondary | ICD-10-CM | POA: Diagnosis not present

## 2022-04-18 DIAGNOSIS — E119 Type 2 diabetes mellitus without complications: Secondary | ICD-10-CM | POA: Diagnosis not present

## 2022-04-19 DIAGNOSIS — I110 Hypertensive heart disease with heart failure: Secondary | ICD-10-CM | POA: Diagnosis not present

## 2022-04-19 DIAGNOSIS — E119 Type 2 diabetes mellitus without complications: Secondary | ICD-10-CM | POA: Diagnosis not present

## 2022-04-19 DIAGNOSIS — S92344D Nondisplaced fracture of fourth metatarsal bone, right foot, subsequent encounter for fracture with routine healing: Secondary | ICD-10-CM | POA: Diagnosis not present

## 2022-04-19 DIAGNOSIS — M6281 Muscle weakness (generalized): Secondary | ICD-10-CM | POA: Diagnosis not present

## 2022-04-19 DIAGNOSIS — M9701XD Periprosthetic fracture around internal prosthetic right hip joint, subsequent encounter: Secondary | ICD-10-CM | POA: Diagnosis not present

## 2022-04-19 DIAGNOSIS — Z4789 Encounter for other orthopedic aftercare: Secondary | ICD-10-CM | POA: Diagnosis not present

## 2022-04-19 DIAGNOSIS — U071 COVID-19: Secondary | ICD-10-CM | POA: Diagnosis not present

## 2022-04-19 DIAGNOSIS — I5043 Acute on chronic combined systolic (congestive) and diastolic (congestive) heart failure: Secondary | ICD-10-CM | POA: Diagnosis not present

## 2022-04-19 DIAGNOSIS — I160 Hypertensive urgency: Secondary | ICD-10-CM | POA: Diagnosis not present

## 2022-04-19 DIAGNOSIS — S92355D Nondisplaced fracture of fifth metatarsal bone, left foot, subsequent encounter for fracture with routine healing: Secondary | ICD-10-CM | POA: Diagnosis not present

## 2022-04-21 DIAGNOSIS — I639 Cerebral infarction, unspecified: Secondary | ICD-10-CM | POA: Diagnosis not present

## 2022-04-21 DIAGNOSIS — E119 Type 2 diabetes mellitus without complications: Secondary | ICD-10-CM | POA: Diagnosis not present

## 2022-04-21 DIAGNOSIS — L97429 Non-pressure chronic ulcer of left heel and midfoot with unspecified severity: Secondary | ICD-10-CM | POA: Diagnosis not present

## 2022-04-21 DIAGNOSIS — I509 Heart failure, unspecified: Secondary | ICD-10-CM | POA: Diagnosis not present

## 2022-04-22 DIAGNOSIS — M9701XD Periprosthetic fracture around internal prosthetic right hip joint, subsequent encounter: Secondary | ICD-10-CM | POA: Diagnosis not present

## 2022-04-22 DIAGNOSIS — K219 Gastro-esophageal reflux disease without esophagitis: Secondary | ICD-10-CM | POA: Diagnosis not present

## 2022-04-22 DIAGNOSIS — I5043 Acute on chronic combined systolic (congestive) and diastolic (congestive) heart failure: Secondary | ICD-10-CM | POA: Diagnosis not present

## 2022-04-22 DIAGNOSIS — S92355D Nondisplaced fracture of fifth metatarsal bone, left foot, subsequent encounter for fracture with routine healing: Secondary | ICD-10-CM | POA: Diagnosis not present

## 2022-04-22 DIAGNOSIS — I160 Hypertensive urgency: Secondary | ICD-10-CM | POA: Diagnosis not present

## 2022-04-22 DIAGNOSIS — I4891 Unspecified atrial fibrillation: Secondary | ICD-10-CM | POA: Diagnosis not present

## 2022-04-22 DIAGNOSIS — I10 Essential (primary) hypertension: Secondary | ICD-10-CM | POA: Diagnosis not present

## 2022-04-22 DIAGNOSIS — M6281 Muscle weakness (generalized): Secondary | ICD-10-CM | POA: Diagnosis not present

## 2022-04-22 DIAGNOSIS — Z4789 Encounter for other orthopedic aftercare: Secondary | ICD-10-CM | POA: Diagnosis not present

## 2022-04-22 DIAGNOSIS — E119 Type 2 diabetes mellitus without complications: Secondary | ICD-10-CM | POA: Diagnosis not present

## 2022-04-22 DIAGNOSIS — S92344D Nondisplaced fracture of fourth metatarsal bone, right foot, subsequent encounter for fracture with routine healing: Secondary | ICD-10-CM | POA: Diagnosis not present

## 2022-04-22 DIAGNOSIS — I110 Hypertensive heart disease with heart failure: Secondary | ICD-10-CM | POA: Diagnosis not present

## 2022-04-22 DIAGNOSIS — U071 COVID-19: Secondary | ICD-10-CM | POA: Diagnosis not present

## 2022-04-23 DIAGNOSIS — M24871 Other specific joint derangements of right ankle, not elsewhere classified: Secondary | ICD-10-CM | POA: Diagnosis not present

## 2022-04-23 DIAGNOSIS — S92355D Nondisplaced fracture of fifth metatarsal bone, left foot, subsequent encounter for fracture with routine healing: Secondary | ICD-10-CM | POA: Diagnosis not present

## 2022-04-23 DIAGNOSIS — S92344D Nondisplaced fracture of fourth metatarsal bone, right foot, subsequent encounter for fracture with routine healing: Secondary | ICD-10-CM | POA: Diagnosis not present

## 2022-04-23 DIAGNOSIS — E119 Type 2 diabetes mellitus without complications: Secondary | ICD-10-CM | POA: Diagnosis not present

## 2022-04-23 DIAGNOSIS — M9701XD Periprosthetic fracture around internal prosthetic right hip joint, subsequent encounter: Secondary | ICD-10-CM | POA: Diagnosis not present

## 2022-04-23 DIAGNOSIS — I5043 Acute on chronic combined systolic (congestive) and diastolic (congestive) heart failure: Secondary | ICD-10-CM | POA: Diagnosis not present

## 2022-04-23 DIAGNOSIS — Z4789 Encounter for other orthopedic aftercare: Secondary | ICD-10-CM | POA: Diagnosis not present

## 2022-04-23 DIAGNOSIS — I160 Hypertensive urgency: Secondary | ICD-10-CM | POA: Diagnosis not present

## 2022-04-23 DIAGNOSIS — M6281 Muscle weakness (generalized): Secondary | ICD-10-CM | POA: Diagnosis not present

## 2022-04-23 DIAGNOSIS — I110 Hypertensive heart disease with heart failure: Secondary | ICD-10-CM | POA: Diagnosis not present

## 2022-04-24 DIAGNOSIS — I10 Essential (primary) hypertension: Secondary | ICD-10-CM | POA: Diagnosis not present

## 2022-04-24 DIAGNOSIS — I4891 Unspecified atrial fibrillation: Secondary | ICD-10-CM | POA: Diagnosis not present

## 2022-04-24 DIAGNOSIS — I509 Heart failure, unspecified: Secondary | ICD-10-CM | POA: Diagnosis not present

## 2022-04-24 DIAGNOSIS — M6281 Muscle weakness (generalized): Secondary | ICD-10-CM | POA: Diagnosis not present

## 2022-04-24 DIAGNOSIS — I700 Atherosclerosis of aorta: Secondary | ICD-10-CM | POA: Diagnosis not present

## 2022-04-24 DIAGNOSIS — M9701XD Periprosthetic fracture around internal prosthetic right hip joint, subsequent encounter: Secondary | ICD-10-CM | POA: Diagnosis not present

## 2022-04-24 DIAGNOSIS — R2689 Other abnormalities of gait and mobility: Secondary | ICD-10-CM | POA: Diagnosis not present

## 2022-04-24 DIAGNOSIS — Z299 Encounter for prophylactic measures, unspecified: Secondary | ICD-10-CM | POA: Diagnosis not present

## 2022-04-27 DIAGNOSIS — I509 Heart failure, unspecified: Secondary | ICD-10-CM | POA: Diagnosis not present

## 2022-04-27 DIAGNOSIS — I495 Sick sinus syndrome: Secondary | ICD-10-CM | POA: Diagnosis not present

## 2022-04-27 DIAGNOSIS — F32A Depression, unspecified: Secondary | ICD-10-CM | POA: Diagnosis not present

## 2022-04-27 DIAGNOSIS — G500 Trigeminal neuralgia: Secondary | ICD-10-CM | POA: Diagnosis not present

## 2022-04-27 DIAGNOSIS — Z9181 History of falling: Secondary | ICD-10-CM | POA: Diagnosis not present

## 2022-04-27 DIAGNOSIS — I2510 Atherosclerotic heart disease of native coronary artery without angina pectoris: Secondary | ICD-10-CM | POA: Diagnosis not present

## 2022-04-27 DIAGNOSIS — E785 Hyperlipidemia, unspecified: Secondary | ICD-10-CM | POA: Diagnosis not present

## 2022-04-27 DIAGNOSIS — I482 Chronic atrial fibrillation, unspecified: Secondary | ICD-10-CM | POA: Diagnosis not present

## 2022-04-27 DIAGNOSIS — Z7901 Long term (current) use of anticoagulants: Secondary | ICD-10-CM | POA: Diagnosis not present

## 2022-04-27 DIAGNOSIS — I110 Hypertensive heart disease with heart failure: Secondary | ICD-10-CM | POA: Diagnosis not present

## 2022-04-27 DIAGNOSIS — L89610 Pressure ulcer of right heel, unstageable: Secondary | ICD-10-CM | POA: Diagnosis not present

## 2022-04-27 DIAGNOSIS — Z8673 Personal history of transient ischemic attack (TIA), and cerebral infarction without residual deficits: Secondary | ICD-10-CM | POA: Diagnosis not present

## 2022-04-27 DIAGNOSIS — M9701XD Periprosthetic fracture around internal prosthetic right hip joint, subsequent encounter: Secondary | ICD-10-CM | POA: Diagnosis not present

## 2022-04-27 DIAGNOSIS — S72141D Displaced intertrochanteric fracture of right femur, subsequent encounter for closed fracture with routine healing: Secondary | ICD-10-CM | POA: Diagnosis not present

## 2022-04-29 ENCOUNTER — Encounter: Payer: Self-pay | Admitting: Nurse Practitioner

## 2022-04-29 ENCOUNTER — Ambulatory Visit: Payer: Medicare Other | Attending: Nurse Practitioner | Admitting: Nurse Practitioner

## 2022-04-29 VITALS — BP 115/62 | HR 79 | Ht 61.5 in | Wt 118.0 lb

## 2022-04-29 DIAGNOSIS — I5032 Chronic diastolic (congestive) heart failure: Secondary | ICD-10-CM

## 2022-04-29 DIAGNOSIS — I495 Sick sinus syndrome: Secondary | ICD-10-CM | POA: Diagnosis not present

## 2022-04-29 DIAGNOSIS — I482 Chronic atrial fibrillation, unspecified: Secondary | ICD-10-CM | POA: Diagnosis not present

## 2022-04-29 DIAGNOSIS — Z01812 Encounter for preprocedural laboratory examination: Secondary | ICD-10-CM

## 2022-04-29 DIAGNOSIS — L89619 Pressure ulcer of right heel, unspecified stage: Secondary | ICD-10-CM

## 2022-04-29 DIAGNOSIS — I714 Abdominal aortic aneurysm, without rupture, unspecified: Secondary | ICD-10-CM | POA: Diagnosis not present

## 2022-04-29 DIAGNOSIS — I2510 Atherosclerotic heart disease of native coronary artery without angina pectoris: Secondary | ICD-10-CM

## 2022-04-29 DIAGNOSIS — E785 Hyperlipidemia, unspecified: Secondary | ICD-10-CM

## 2022-04-29 DIAGNOSIS — I10 Essential (primary) hypertension: Secondary | ICD-10-CM

## 2022-04-29 DIAGNOSIS — R531 Weakness: Secondary | ICD-10-CM

## 2022-04-29 DIAGNOSIS — R5383 Other fatigue: Secondary | ICD-10-CM | POA: Diagnosis not present

## 2022-04-29 DIAGNOSIS — Z950 Presence of cardiac pacemaker: Secondary | ICD-10-CM | POA: Diagnosis not present

## 2022-04-29 MED ORDER — VALSARTAN 40 MG PO TABS: 40.0000 mg | ORAL_TABLET | Freq: Every morning | ORAL | 6 refills | Status: DC

## 2022-04-29 NOTE — Patient Instructions (Addendum)
Medication Instructions:  Decrease Valsartan to '40mg'$  daily  Continue all other current medications.    Labwork: BMET - order given today  Do just prior to CT Angio   Testing/Procedures: CT Angio - due July 2024  Follow-Up: 6 months - Dr. Dellia Cloud   Any Other Special Instructions Will Be Listed Below (If Applicable). You have been referred to Wound Clinic    If you need a refill on your cardiac medications before your next appointment, please call your pharmacy.

## 2022-04-29 NOTE — Progress Notes (Unsigned)
Cardiology Office Note:    Date:  04/29/2022  ID:  Karina [name], DOB July 13, 1922, MRN [id number]  PCP:  Karina [name], [place] Providers Cardiologist:  None Electrophysiologist:  Karina Peru, MD     Referring MD: Karina Blitz, MD   CC: Here to establish care  History of Present Illness:    Karina [name] is a delightful 89 y.o. female with a hx of the following:  CAD, s/p remote PCI/ stenting of LAD Longstanding HTN Hyperlipidemia AAA without rupture, s/p EVR Chronic A-fib, on Eliquis Chronic diastolic heart failure Shortness of breath Bilateral leg weakness Tachybradycardia syndrome, s/p PPM History of TIA Peripheral neuropathy PVD Prediabetes Chronic hyponatremia  Patient is a 89 year old female with past medical history as mentioned above.   Has been closely followed by Dr. [name] for history of pacemaker.  Last seen by Dr. [name] in February 2023.  Pacemaker was working normally at the time.  She denied any chest pain symptoms.  No medication changes were made.  Was told to follow-up in 1 year.  She has been followed by [facility] cardiology previously.  She was admitted locally in June 2023 for TIA noted by facial droop and slurred speech.  CT of brain without acute abnormality.  MRI was not completed due to pacemaker.   She presented back to the ED in July 2023 with shortness of breath and bilateral pleural effusions, was diuresed appropriately.  Echocardiogram revealed normal EF.  Presented back to the ED in October 2023 with right facial numbness and tested positive for COVID.  Presented back to the ED later that month with epistaxis and underwent cauterization.  Last seen at the [facility] cardiology office in November 2023.  Patient reported TIA symptoms resolved after 1 block in the ambulance.  She denied any recurrence of TIA symptoms at the office that day.  She reported less severe bleeding since her cauterization.  She was less active and had  deconditioned.  She was considering a stay in rehab for a month to regain her strength.  Appetite was less and was eating very little.  Reported drinking Ensure.  She was living at home by herself with hired help, was by herself in the overnight hours.  Blood pressure at home labile.  [name] with [facility] discussed minimizing ARB dose due to hyponatremia, and could increase metoprolol and clonidine in future if needed.  Was told to follow-up in January 2024.  Today she presents to establish cardiac care closer to home, has been referred by her PCP. She presents with her son. Son helps as historian, as patient has noticeable forgetfulness during interview today. Patient's CC today is her pressure injury on her right heel and states this is affecting her activity and mobility, as she states she "loves to exercise" and wants to get back to exercising. Cardiac wise, she is doing well. Denies any chest pain, shortness of breath, palpitations, syncope, presyncope, dizziness, orthopnea, PND, swelling or significant weight changes, acute bleeding, or claudication. Son states she does have some weakness, as well as low energy. Denies any other questions or concerns.   SH: Son is Visual merchandiser and her late husband was Karina [name] in [place], [place]. Karina [name] in [place], [place] is named after Karina [name].   Past Medical History:  Diagnosis Date   Actinic keratosis    Bilateral leg weakness 09/27/12   CAD (coronary artery disease)    EF 55% by 2 D Echo 2008 and 2012  Chronic atrial fibrillation (HCC)    Chronic edema 10/15/11   Chronic fatigue    DDD (degenerative disc disease)    Encounter for long-term (current) use of other medications 09/02/12   Endometrial carcinoma ([place])    Fibromuscular dysplasia ([place])    S/P PCI right renal aretery 1999   GERD (gastroesophageal reflux disease)    Hip fracture, right ([place]) 08/06/06   S/P surgery   Hx of cardiovascular stress test 09/2010    Nuclear stress testing showing no evidence of ischemia, EF=76%, No EKG changes & no perfusion defects.   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   LVH (left ventricular hypertrophy)    Mild   Osteoarthritis    Osteoporosis    With Hx of thoracic compression fractures   Pacemaker    Paroxysmal atrial fibrillation (HCC)    Peripheral neuropathy    Peripheral vascular disease ([place])    Pre-diabetes 01/21/11   Chronic   Renovascular hypertension    Tachy-brady syndrome (HCC)    Thoracic compression fracture (HCC)    TIA (transient ischemic attack) 08/2011   OSH: fl[place], left LE numbness, CT negative   Tic [place] 1994   S/P successful surgery   Trigeminal neuralgia of right side of face     Past Surgical History:  Procedure Laterality Date   ABDOMINAL AORTAGRAM  09/05/01   Right & left renals 25%   APPENDECTOMY  1946   CARDIAC CATHETERIZATION  11/05/01   EF 72%. RCA 25%. LCX 50%. Ramus 75/100%. LAD 95%. D2 75%.   [place]   CORONARY ANGIOPLASTY     CORONARY ANGIOPLASTY WITH STENT PLACEMENT  11/05/01   PTCA/Stent to 95% mid LAD, Ramus lesion could not be crossed and was not treated.    DIPYRIDAMOLE STRESS TEST  09/2010   Nuclear stress testing showing no evidence of ischemia, EF=76%, No EKG changes & no perfusion defects.   LUMBAR DISC SURGERY     ORIF FEMUR FRACTURE Right 02/16/2017   Procedure: OPEN REDUCTION INTERNAL FIXATION (ORIF) DISTAL FEMUR FRACTURE;  Surgeon: Shona Needles, MD;  Location: [place];  Service: Orthopedics;  Laterality: Right;   RENAL ANGIOGRAM  09/14/97   25% diffuse left renal artery. 50% ostia; right renal artery.   RENAL ARTERY ANGIOPLASTY Right 1994   Renal artery stenosis secondary to fibromusclar dysplasia   RENAL ARTERY ANGIOPLASTY Right  09/13/97   PTA/Stent to ostial right renal artery, No distal fibromuscular hyperplasia   SPINAL FUSION  2002   Percutaneous spinal fusion   TOTAL ABDOMINAL HYSTERECTOMY W/ BILATERAL SALPINGOOPHORECTOMY  1990   VAGINAL HYSTERECTOMY  1990    Current Medications: Current Meds  Medication Sig   apixaban (ELIQUIS) 2.5 MG TABS tablet Take 1 tablet (2.5 mg total) 2 (two) times daily by mouth.   benzonatate (TESSALON) 200 MG capsule Take 200 mg by mouth daily as needed.   cloNIDine (CATAPRES) 0.1 MG tablet Take 0.1 mg by mouth 3 (three) times daily.   cycloSPORINE (RESTASIS) 0.05 % ophthalmic emulsion Place 1 drop into both eyes 2 (two) times daily.   diltiazem (TIAZAC) 120 MG 24 hr capsule Take 120 mg by mouth daily.   feeding supplement (ENSURE ENLIVE / ENSURE PLUS) LIQD Take 237 mLs  by mouth 2 (two) times daily between meals.   furosemide (LASIX) 40 MG tablet Take 0.5 tablets (20 mg total) by mouth daily. (Patient taking differently: Take 40 mg by mouth every other day.)   metoprolol succinate (TOPROL-XL) 25 MG 24 hr tablet Take 25 mg by mouth 2 (two) times daily.   mirtazapine (REMERON SOL-TAB) 15 MG disintegrating tablet Take 15 mg by mouth every morning.   nitroGLYCERIN (NITROSTAT) 0.4 MG SL tablet    valsartan (DIOVAN) 80 MG tablet Take 80 mg by mouth every morning.     Allergies:   Ace inhibitors, Amiodarone, Tikosyn [dofetilide], and Meperidine and related   Social History   Socioeconomic History   Marital status: Widowed    Spouse name: Not on file   Number of children: Not on file   Years of education: Not on file   Highest education level: Not on file  Occupational History   Not on file  Tobacco Use   Smoking status: Never   Smokeless tobacco: Never  Vaping Use   Vaping Use: Never used  Substance and Sexual Activity   Alcohol use: Yes    Alcohol/week: 0.0 standard drinks of alcohol    Comment: Rarely   Drug use: No   Sexual activity: Not Currently  Other Topics  Concern   Not on file  Social History Narrative   Not on file   Social Determinants of Health   Financial Resource Strain: Not on file  Food Insecurity: No Food Insecurity (02/06/2022)   Hunger Vital Sign    Worried About Running Out of Food in the Last Year: Never true    Ran Out of Food in the Last Year: Never true  Transportation Needs: No Transportation Needs (02/06/2022)   PRAPARE - Hydrologist (Medical): No    Lack of Transportation (Non-Medical): No  Physical Activity: Not on file  Stress: Not on file  Social Connections: Not on file     Family History: The patient's family history includes COPD in her brother; Diabetes in her brother; Hypertension in her brother; Stroke in her father and another family member.  ROS:   Review of Systems  Constitutional:  Positive for malaise/fatigue. Negative for chills, diaphoresis, fever and weight loss.  HENT: Negative.    Eyes: Negative.   Respiratory: Negative.    Cardiovascular: Negative.   Gastrointestinal: Negative.   Genitourinary: Negative.   Musculoskeletal: Negative.   Skin: Negative.        See HPI - pressure injury on right heel.   Neurological:  Positive for weakness. Negative for dizziness, tingling, tremors, sensory change, speech change, focal weakness, seizures, loss of consciousness and headaches.  Endo/Heme/Allergies: Negative.   Psychiatric/Behavioral:  Positive for memory loss. Negative for depression, hallucinations, substance abuse and suicidal ideas. The patient is not nervous/anxious and does not have insomnia.     Please see the history of present illness.    All other systems reviewed and are negative.  EKGs/Labs/Other Studies Reviewed:    The following studies were reviewed today:   EKG:  EKG is not ordered today.     Echocardiogram on 11/04/2021: 1. Left ventricular ejection fraction, by estimation, is 55 to 60%. The  left ventricle has normal function. The left  ventricle has no regional  wall motion abnormalities. There is mild left ventricular hypertrophy.  Left ventricular diastolic parameters  are indeterminate.   2. Right ventricular systolic function is normal. The right ventricular  size is normal.  There is moderately elevated pulmonary artery systolic  pressure.   3. Left atrial size was severely dilated.   4. Right atrial size was severely dilated.   5. Mild mitral valve regurgitation.   6. The aortic valve is tricuspid. Aortic valve regurgitation is mild.  Aortic valve sclerosis/calcification is present, without any evidence of  aortic stenosis.   7. The inferior vena cava is dilated in size with >50% respiratory  variability, suggesting right atrial pressure of 8 mmHg.   Comparison(s): The left ventricular function no significant change. The  left ventricular hypertrophy has improved.  CT abdomen/pelvis on 11/02/2021: IMPRESSION: CTA of the chest: No evidence of pulmonary emboli.   Bilateral pleural effusions with basilar atelectasis.   Chronic compression deformities of T11 and T12 stable from the prior exam.   CT of the abdomen and pelvis: Diverticulosis without diverticulitis.   Abdominal aortic aneurysm with evidence of stent graft therapy without significant change in the size of the aneurysm sac. No endoleak is identified.   Stable pancreatic cyst in the mid body. No further follow-up is recommended given the patient's age.  Recent Labs: 11/03/2021: TSH 1.154 01/11/2022: B Natriuretic Peptide 314.0 02/05/2022: ALT 15; BUN 13; Creatinine, Ser 1.03; Hemoglobin 11.8; Magnesium 1.7; Platelets 227; Potassium 4.3; Sodium 138  Recent Lipid Panel No results found for: "CHOL", "TRIG", "HDL", "CHOLHDL", "VLDL", "LDLCALC", "LDLDIRECT"   Physical Exam:    VS:  BP 115/62 (BP Location: Left Arm, Patient Position: Sitting, Cuff Size: Normal)   Pulse 79   Ht 5' 1.5" (1.562 m)   Wt 118 lb (53.5 kg)   SpO2 98%   BMI 21.93 kg/m      Wt Readings from Last 3 Encounters:  04/29/22 118 lb (53.5 kg)  02/11/22 115 lb 1.3 oz (52.2 kg)  01/11/22 115 lb 1.3 oz (52.2 kg)     GEN: Thin, frail 89 y.o. female in no acute distress, sitting in wheelchair HEENT: Normal NECK: No JVD; No carotid bruits CARDIAC: S1/S2, RRR, no murmurs, rubs, gallops; 2+ pulses RESPIRATORY:  Clear to auscultation without rales, wheezing or rhonchi  MUSCULOSKELETAL:  Trace, minimal edema along right foot, no other edema noted; Brace noted along left lower leg SKIN: Thin skin, warm and dry, pressure injury noted to right heel, right foot discoloration noted NEUROLOGIC:  Alert and oriented x 3, some occasional forgetfulness PSYCHIATRIC:  Normal, pleasant affect   ASSESSMENT:    1. Coronary artery disease involving native heart without angina pectoris, unspecified vessel or lesion type   2. Essential hypertension   3. Hyperlipidemia, unspecified hyperlipidemia type   4. Abdominal aortic aneurysm (AAA) without rupture, unspecified part ([place])   5. Pre-procedure lab exam   6. Chronic a-fib ([place])   7. Chronic diastolic CHF (congestive heart failure) ([place])   8. Tachy-brady syndrome ([place])   9. Pacemaker   10. Pressure injury of skin of right heel, unspecified injury stage   11. Generalized weakness   12. Fatigue, unspecified type    PLAN:    In order of problems listed above:  CAD, s/p remote PCI/stenting of LAD Stable with no anginal symptoms. No indication for ischemic evaluation.  Continue Toprol-XL, rosuvastatin, and nitroglycerin as needed.  Will reduce valsartan to 40 mg daily. Heart healthy diet and regular cardiovascular exercise as tolerated encouraged.   HTN Blood pressure on arrival 100/62, repeat BP 115/62.  Will reduce valsartan to 40 mg daily.  Continue rest of medication regimen. Heart healthy diet and regular cardiovascular exercise as  tolerated encouraged.   HLD Being managed by PCP. Continue rosuvastatin. Heart healthy diet  and regular cardiovascular exercise as tolerated encouraged.   AAA without rupture, s/p EVR CT of the abdomen in July 2023 showed abdominal aortic aneurysm with evidence of stent graft therapy without significant change in size of the aneurysm sac.  No endoleak was identified.  Patient denies any symptoms.  Will plan on repeat CT imaging of abdomen for monitoring in 10/2022.  Will check BMET prior to testing per protocol.  Chronic A-fib, on Eliquis Patient denies any tachycardia or palpitations.  Continue Toprol-XL.  Continue Eliquis 2.5 mg twice daily, she is on appropriate dosage.  Denies any bleeding issues.  6. Chronic diastolic CHF Echocardiogram in July 2023 showed normal EF, no RWMA, mild LVH. Euvolemic and well compensated on exam.  Continue current medication regimen. Low sodium diet, fluid restriction <2L, and daily weights encouraged. Educated to contact our office for weight gain of 2 lbs overnight or 5 lbs in one week. Heart healthy diet and regular cardiovascular exercise as tolerated encouraged.   7. Tachybrady syndrome, s/p PPM Denies any symptoms.  Continue to follow-up with the EP.  8. Right heel pressure injury Patient's chief complaint today.  I unwrapped this wound and soft pressure ulcer located along right heel, some eschar tissue noted.  Was covered by Tegaderm.  I recommended using Mepilex to cover area and to float heels when sitting or lying down. I redressed this wound for patient. Will refer patient to wound care clinic locally. Continue to follow with PCP.  Generalized weakness/fatigue Etiology multifactorial. Will reduce Valsartan to 40 mg daily as mentioned above.  Recommended slowly increasing physical activity as much as tolerated.  Heart healthy diet recommended. Continue to follow with PCP.   7. Disposition: Patient in requesting to see Dr. Domenic Polite for follow-up. Will have patient see Dr. Domenic Polite or another Cardiologist in 4-6 months or sooner if anything  changes.    Medication Adjustments/Labs and Tests Ordered: Current medicines are reviewed at length with the patient today.  Concerns regarding medicines are outlined above.  Orders Placed This Encounter  Procedures   CT Angio Abd/Pel w/ and/or w/o   Basic metabolic panel   Basic metabolic panel   Ambulatory referral to Physical Therapy   Meds ordered this encounter  Medications   valsartan (DIOVAN) 40 MG tablet    Sig: Take 1 tablet (40 mg total) by mouth every morning.    Dispense:  30 tablet    Refill:  6    Dose decreased 04/29/2022    Patient Instructions  Medication Instructions:  Decrease Valsartan to '40mg'$  daily  Continue all other current medications.    Labwork: BMET - order given today  Do just prior to CT Angio   Testing/Procedures: CT Angio - due July 2024  Follow-Up: 6 months - Dr. Dellia Cloud   Any Other Special Instructions Will Be Listed Below (If Applicable). You have been referred to Wound Clinic    If you need a refill on your cardiac medications before your next appointment, please call your pharmacy.    Signed, Finis Bud, NP  04/30/2022 8:27 PM    [place]

## 2022-04-30 ENCOUNTER — Encounter: Payer: Self-pay | Admitting: Nurse Practitioner

## 2022-04-30 NOTE — Addendum Note (Signed)
Addended by: Finis Bud on: 04/30/2022 08:36 PM   Modules accepted: Level of Service

## 2022-05-01 ENCOUNTER — Telehealth: Payer: Self-pay | Admitting: Nurse Practitioner

## 2022-05-01 NOTE — Telephone Encounter (Signed)
Checking percert on the following patient for testing scheduled at [facility].   CT ANGIO ABD/PEL W/CM AND/OR W   10/20/2022

## 2022-05-02 DIAGNOSIS — Z8673 Personal history of transient ischemic attack (TIA), and cerebral infarction without residual deficits: Secondary | ICD-10-CM | POA: Diagnosis not present

## 2022-05-02 DIAGNOSIS — F32A Depression, unspecified: Secondary | ICD-10-CM | POA: Diagnosis not present

## 2022-05-02 DIAGNOSIS — I495 Sick sinus syndrome: Secondary | ICD-10-CM | POA: Diagnosis not present

## 2022-05-02 DIAGNOSIS — E785 Hyperlipidemia, unspecified: Secondary | ICD-10-CM | POA: Diagnosis not present

## 2022-05-02 DIAGNOSIS — S72141D Displaced intertrochanteric fracture of right femur, subsequent encounter for closed fracture with routine healing: Secondary | ICD-10-CM | POA: Diagnosis not present

## 2022-05-02 DIAGNOSIS — Z7901 Long term (current) use of anticoagulants: Secondary | ICD-10-CM | POA: Diagnosis not present

## 2022-05-02 DIAGNOSIS — I110 Hypertensive heart disease with heart failure: Secondary | ICD-10-CM | POA: Diagnosis not present

## 2022-05-02 DIAGNOSIS — I482 Chronic atrial fibrillation, unspecified: Secondary | ICD-10-CM | POA: Diagnosis not present

## 2022-05-02 DIAGNOSIS — M9701XD Periprosthetic fracture around internal prosthetic right hip joint, subsequent encounter: Secondary | ICD-10-CM | POA: Diagnosis not present

## 2022-05-02 DIAGNOSIS — Z9181 History of falling: Secondary | ICD-10-CM | POA: Diagnosis not present

## 2022-05-02 DIAGNOSIS — L89610 Pressure ulcer of right heel, unstageable: Secondary | ICD-10-CM | POA: Diagnosis not present

## 2022-05-02 DIAGNOSIS — G500 Trigeminal neuralgia: Secondary | ICD-10-CM | POA: Diagnosis not present

## 2022-05-02 DIAGNOSIS — I2510 Atherosclerotic heart disease of native coronary artery without angina pectoris: Secondary | ICD-10-CM | POA: Diagnosis not present

## 2022-05-02 DIAGNOSIS — I509 Heart failure, unspecified: Secondary | ICD-10-CM | POA: Diagnosis not present

## 2022-05-05 ENCOUNTER — Emergency Department (HOSPITAL_COMMUNITY)
Admission: EM | Admit: 2022-05-05 | Discharge: 2022-05-05 | Disposition: A | Discharge status: Home / Self Care | Payer: Medicare Other | Attending: Emergency Medicine | Admitting: Emergency Medicine

## 2022-05-05 ENCOUNTER — Encounter (HOSPITAL_COMMUNITY): Payer: Self-pay | Admitting: *Deleted

## 2022-05-05 ENCOUNTER — Emergency Department (HOSPITAL_COMMUNITY): Payer: Medicare Other

## 2022-05-05 ENCOUNTER — Other Ambulatory Visit: Payer: Self-pay

## 2022-05-05 DIAGNOSIS — Z79899 Other long term (current) drug therapy: Secondary | ICD-10-CM | POA: Insufficient documentation

## 2022-05-05 DIAGNOSIS — Z7901 Long term (current) use of anticoagulants: Secondary | ICD-10-CM | POA: Insufficient documentation

## 2022-05-05 DIAGNOSIS — I10 Essential (primary) hypertension: Secondary | ICD-10-CM | POA: Diagnosis not present

## 2022-05-05 DIAGNOSIS — Z4801 Encounter for change or removal of surgical wound dressing: Secondary | ICD-10-CM | POA: Diagnosis not present

## 2022-05-05 DIAGNOSIS — L8995 Pressure ulcer of unspecified site, unstageable: Secondary | ICD-10-CM | POA: Diagnosis not present

## 2022-05-05 DIAGNOSIS — Z5189 Encounter for other specified aftercare: Secondary | ICD-10-CM | POA: Insufficient documentation

## 2022-05-05 DIAGNOSIS — E119 Type 2 diabetes mellitus without complications: Secondary | ICD-10-CM | POA: Insufficient documentation

## 2022-05-05 DIAGNOSIS — M7989 Other specified soft tissue disorders: Secondary | ICD-10-CM | POA: Diagnosis not present

## 2022-05-05 DIAGNOSIS — Z743 Need for continuous supervision: Secondary | ICD-10-CM | POA: Diagnosis not present

## 2022-05-05 DIAGNOSIS — Z8673 Personal history of transient ischemic attack (TIA), and cerebral infarction without residual deficits: Secondary | ICD-10-CM | POA: Insufficient documentation

## 2022-05-05 DIAGNOSIS — L03115 Cellulitis of right lower limb: Secondary | ICD-10-CM | POA: Insufficient documentation

## 2022-05-05 LAB — CBC WITH DIFFERENTIAL/PLATELET
Abs Immature Granulocytes: 0.07 10*3/uL (ref 0.00–0.07)
Basophils Absolute: 0.1 10*3/uL (ref 0.0–0.1)
Basophils Relative: 1 %
Eosinophils Absolute: 0.3 10*3/uL (ref 0.0–0.5)
Eosinophils Relative: 3 %
HCT: 42.8 % (ref 36.0–46.0)
Hemoglobin: 13.5 g/dL (ref 12.0–15.0)
Immature Granulocytes: 1 %
Lymphocytes Relative: 20 %
Lymphs Abs: 1.6 10*3/uL (ref 0.7–4.0)
MCH: 29.6 pg (ref 26.0–34.0)
MCHC: 31.5 g/dL (ref 30.0–36.0)
MCV: 93.9 fL (ref 80.0–100.0)
Monocytes Absolute: 0.9 10*3/uL (ref 0.1–1.0)
Monocytes Relative: 11 %
Neutro Abs: 5.4 10*3/uL (ref 1.7–7.7)
Neutrophils Relative %: 64 %
Platelets: 288 10*3/uL (ref 150–400)
RBC: 4.56 MIL/uL (ref 3.87–5.11)
RDW: 13.4 % (ref 11.5–15.5)
WBC: 8.3 10*3/uL (ref 4.0–10.5)
nRBC: 0 % (ref 0.0–0.2)

## 2022-05-05 LAB — BASIC METABOLIC PANEL
Anion gap: 12 (ref 5–15)
BUN: 20 mg/dL (ref 8–23)
CO2: 24 mmol/L (ref 22–32)
Calcium: 9.9 mg/dL (ref 8.9–10.3)
Chloride: 98 mmol/L (ref 98–111)
Creatinine, Ser: 0.97 mg/dL (ref 0.44–1.00)
GFR, Estimated: 53 mL/min — ABNORMAL LOW (ref >60)
Glucose, Bld: 105 mg/dL — ABNORMAL HIGH (ref 70–99)
Potassium: 3.9 mmol/L (ref 3.5–5.1)
Sodium: 134 mmol/L — ABNORMAL LOW (ref 135–145)

## 2022-05-05 MED ORDER — KETOROLAC TROMETHAMINE 15 MG/ML IJ SOLN
15.0000 mg | Freq: Once | INTRAMUSCULAR | Status: AC
  Administered 2022-05-05: 15 mg via INTRAMUSCULAR
  Filled 2022-05-05: qty 1

## 2022-05-05 MED ORDER — CEPHALEXIN 500 MG PO CAPS: 500.0000 mg | ORAL_CAPSULE | Freq: Three times a day (TID) | ORAL | 0 refills | Status: AC

## 2022-05-05 NOTE — ED Provider Notes (Signed)
[place] Provider Note   CSN: [id number] Arrival date & time: 05/05/22  1053     History  Chief Complaint  Patient presents with   Wound Check    Karina [name] is a 89 y.o. female with a past medical history of TIA, multiple orthopedic injuries, diabetes and hypertension presenting today with concern of a wound to her posterior right heel.  She reports having a fall in December and ever since then she has been going to physical therapy and spent some time in inpatient rehab.  She says that she has a wound on her foot that has been present and they have been trying to treat it without patient ointment but she believes that she needs something "more severe."   Wound Check       Home Medications Prior to Admission medications   Medication Sig Start Date End Date Taking? Authorizing Provider  cephALEXin (KEFLEX) 500 MG capsule Take 1 capsule (500 mg total) by mouth 3 (three) times daily for 5 days. 05/05/22 05/10/22 Yes Eleasha Cataldo A, PA-C  apixaban (ELIQUIS) 2.5 MG TABS tablet Take 1 tablet (2.5 mg total) 2 (two) times daily by mouth. 02/23/17   Eloise Levels, MD  benzonatate (TESSALON) 200 MG capsule Take 200 mg by mouth daily as needed. 12/04/21   [provider]  cloNIDine (CATAPRES) 0.1 MG tablet Take 0.1 mg by mouth 3 (three) times daily. 05/07/09   [provider]  cycloSPORINE (RESTASIS) 0.05 % ophthalmic emulsion Place 1 drop into both eyes 2 (two) times daily.    [provider]  diltiazem (TIAZAC) 120 MG 24 hr capsule Take 120 mg by mouth daily. 12/12/21   [provider]  feeding supplement (ENSURE ENLIVE / ENSURE PLUS) LIQD Take 237 mLs by mouth 2 (two) times daily between meals. 11/05/21   Orson Eva, MD  furosemide (LASIX) 40 MG tablet Take 0.5 tablets (20 mg total) by mouth daily. Patient taking differently: Take 40 mg by mouth every other day. 11/05/21   Orson Eva, MD  metoprolol  succinate (TOPROL-XL) 25 MG 24 hr tablet Take 25 mg by mouth 2 (two) times daily. 04/17/21   [provider]  mirtazapine (REMERON SOL-TAB) 15 MG disintegrating tablet Take 15 mg by mouth every morning.    [provider]  nitroGLYCERIN (NITROSTAT) 0.4 MG SL tablet     [provider]  pantoprazole (PROTONIX) 40 MG tablet Take 40 mg by mouth every morning. Patient not taking: Reported on 01/11/2022    [provider]  QUEtiapine (SEROQUEL) 25 MG tablet Take 1 tablet (25 mg total) by mouth at bedtime. 02/10/22   Charlynne Cousins, MD  rosuvastatin (CRESTOR) 5 MG tablet Take 1 tablet (5 mg total) by mouth daily. Patient not taking: Reported on 04/29/2022 12/21/18   Roxan Hockey, MD  valsartan (DIOVAN) 40 MG tablet Take 1 tablet (40 mg total) by mouth every morning. 04/29/22   Finis Bud, NP      Allergies    Ace inhibitors, Amiodarone, Tikosyn [dofetilide], and Meperidine and related    Review of Systems   Review of Systems  Physical Exam Updated Vital Signs BP (!) 123/99   Pulse 78   Temp 97.9 F (36.6 C) (Oral)   Resp 16   SpO2 98%  Physical Exam Vitals and nursing note reviewed.  Constitutional:      Appearance: Normal appearance.  HENT:     Head: Normocephalic and atraumatic.  Eyes:     General: No scleral icterus.    Conjunctiva/sclera: Conjunctivae normal.  Cardiovascular:     Comments: +DP pulse bilat Pulmonary:     Effort: Pulmonary effort is normal. No respiratory distress.  Skin:    Findings: No rash.  Neurological:     Mental Status: She is alert.  Psychiatric:        Mood and Affect: Mood normal.         ED Results / Procedures / Treatments   Labs (all labs ordered are listed, but only abnormal results are displayed) Labs Reviewed  BASIC METABOLIC PANEL - Abnormal; Notable for the following components:      Result Value   Sodium 134 (*)    Glucose, Bld 105 (*)    GFR, Estimated 53 (*)    All other  components within normal limits  CBC WITH DIFFERENTIAL/PLATELET    EKG None  Radiology DG Foot Complete Right  Result Date: 05/05/2022 CLINICAL DATA:  Heel ulcer.  Pain and swelling. EXAM: RIGHT FOOT COMPLETE - 3+ VIEW COMPARISON:  Right foot x-ray 02/06/2022. FINDINGS: The bones are diffusely osteopenic. There is soft tissue swelling over the heel of the foot and over the dorsum of the foot. There is no cortical erosion, acute fracture or dislocation. Peripheral vascular calcifications are present. IMPRESSION: Soft tissue swelling over the heel of the foot and over the dorsum of the foot. No radiographic evidence of osteomyelitis. Electronically Signed   By: Ronney Asters M.D.   On: 05/05/2022 16:20    Procedures Procedures   Medications Ordered in ED Medications  ketorolac (TORADOL) 15 MG/ML injection 15 mg (has no administration in time range)    ED Course/ Medical Decision Making/ A&P Clinical Course as of 05/05/22 Ledora Bottcher May 05, 2022  1622 DG Foot Complete Right [AH]    Clinical Course User Index [AH] Rica Koyanagi                             Medical Decision Making Risk Prescription drug management.   89 year old female presenting with a wound to the right foot.  Differential includes but is not limited to cellulitis, skin breakdown, osteomyelitis, ulceration.  Not exhaustive.  Physical exam: Relatively benign.  Stage II pressure ulcer of the right heel and potential developing stage I ulcer of the posterior left heel. neurovascularly intact on both sides  Imaging: X-ray ordered in triage.  I viewed and interpreted this and agree that there are no abnormalities.  Low suspicion for osteomyelitis requiring further imaging such as MRI  Treatment: Given Toradol shot for pain  MDM/disposition: 89 year old female presenting with a wound to the right foot.  Has some surrounding cellulitis.  Not tachycardic, afebrile and low suspicion osteomyelitis.   Patient does not require IV antibiotics nor does she require admission to the hospital.  She will be sent home with Keflex and follow-up outpatient.  She tells me that she has a wound care appointment on Thursday so she can follow-up with them.  I do not note this in her chart however it may be at an outside facility.  She repeatedly asked if there is anybody here who can perform "the operation."  I continued to reassure her that she at present did not require any further intervention.  She ultimately was agreeable to discharge after lengthy conversations.   Final Clinical Impression(s) / ED Diagnoses Final diagnoses:  Visit for wound  check  Cellulitis of right lower extremity    Rx / DC Orders ED Discharge Orders          Ordered    cephALEXin (KEFLEX) 500 MG capsule  3 times daily        05/05/22 1831           Results and diagnoses were explained to the patient. Return precautions discussed in full. Patient had no additional questions and expressed complete understanding.   This chart was dictated using voice recognition software.  Despite best efforts to proofread,  errors can occur which can change the documentation meaning.    Darliss Ridgel 05/05/22 1918    Davonna Belling, MD 05/06/22 1453

## 2022-05-05 NOTE — ED Notes (Signed)
This RN spoke with Pt friend identified in chart:Vivien Rota [phone], per pt request, reports will be pt discharge ride home.

## 2022-05-05 NOTE — ED Triage Notes (Signed)
[facility] BIB pt in for wound (pressure ulcer) to back of right heel around Dec 23 since in SNF for rehab due to hip fx, pt currently at home and has [place]. Reported that pt did not sleep last night due to pain.

## 2022-05-05 NOTE — ED Provider Triage Note (Signed)
Emergency Medicine Provider Triage Evaluation Note  Karina [name] , a 89 y.o. female  was evaluated in triage.  Pt complains of right foot pain and swelling.  She was recently discharged from a rehab facility to home with round-the-clock nursing care.  During her rehab stay she developed an ulcerated wound on her right heel and has increased pain and now swelling across to her right dorsal foot..  Review of Systems  Positive: Right foot pain and swelling, heel ulcer Negative: Fever, rash  Physical Exam  BP (!) 169/98 (BP Location: Right Arm)   Pulse 77   Temp (!) 96.1 F (35.6 C) (Axillary)   Resp 16   SpO2 98%  Gen:   Awake, no distress   Resp:  Normal effort  MSK:   Moves extremities without difficulty  Other:    Medical Decision Making  Medically screening exam initiated at 3:18 PM.  Appropriate orders placed.  Karina [name] was informed that the remainder of the evaluation will be completed by another provider, this initial triage assessment does not replace that evaluation, and the importance of remaining in the ED until their evaluation is complete.  Basic labs and imaging ordered, concern for foot infection/cellulitis.   Karina Jefferson, PA-C 05/05/22 1520

## 2022-05-05 NOTE — ED Notes (Signed)
Pt d/c home per MD order. Discharge summary reviewed, pt verbalizes understanding. Off unit via WC; No s/s of acute distress noted, has discharge ride home.

## 2022-05-05 NOTE — Discharge Instructions (Addendum)
You came to the emergency department today with a sore to the back of your foot.  It is very important that you keep your feet elevated when you are sitting in the bed.  You are being started on antibiotics and were given a pain shot today.  Continue with the antibiotics for the next 4 days.  Continue to keep the wound clean with mild soap and use bacitracin ointment before wrapping it in the morning and at night. Please call the foot doctor attached to these discharge papers for an appointment.

## 2022-05-13 ENCOUNTER — Telehealth: Payer: Self-pay

## 2022-05-13 DIAGNOSIS — Z515 Encounter for palliative care: Secondary | ICD-10-CM | POA: Diagnosis not present

## 2022-05-13 DIAGNOSIS — I639 Cerebral infarction, unspecified: Secondary | ICD-10-CM | POA: Diagnosis not present

## 2022-05-13 DIAGNOSIS — B351 Tinea unguium: Secondary | ICD-10-CM | POA: Diagnosis not present

## 2022-05-13 DIAGNOSIS — M79676 Pain in unspecified toe(s): Secondary | ICD-10-CM | POA: Diagnosis not present

## 2022-05-13 NOTE — Telephone Encounter (Signed)
     Patient  visit on 1/29  at [facility] you been able to follow up with your primary care physician? Yes   The patient was or was not able to obtain any needed medicine or equipment. Yes   Are there diet recommendations that you are having difficulty following? Na    Patient expresses understanding of discharge instructions and education provided has no other needs at this time.  Yes      [facility] [phone] [address], [place], [place] [address] Phone: [phone] Email: [email or web address]

## 2022-05-14 DIAGNOSIS — Z7901 Long term (current) use of anticoagulants: Secondary | ICD-10-CM | POA: Diagnosis not present

## 2022-05-14 DIAGNOSIS — L89610 Pressure ulcer of right heel, unstageable: Secondary | ICD-10-CM | POA: Diagnosis not present

## 2022-05-14 DIAGNOSIS — I495 Sick sinus syndrome: Secondary | ICD-10-CM | POA: Diagnosis not present

## 2022-05-14 DIAGNOSIS — F32A Depression, unspecified: Secondary | ICD-10-CM | POA: Diagnosis not present

## 2022-05-14 DIAGNOSIS — I509 Heart failure, unspecified: Secondary | ICD-10-CM | POA: Diagnosis not present

## 2022-05-14 DIAGNOSIS — G500 Trigeminal neuralgia: Secondary | ICD-10-CM | POA: Diagnosis not present

## 2022-05-14 DIAGNOSIS — Z8673 Personal history of transient ischemic attack (TIA), and cerebral infarction without residual deficits: Secondary | ICD-10-CM | POA: Diagnosis not present

## 2022-05-14 DIAGNOSIS — I482 Chronic atrial fibrillation, unspecified: Secondary | ICD-10-CM | POA: Diagnosis not present

## 2022-05-14 DIAGNOSIS — I2510 Atherosclerotic heart disease of native coronary artery without angina pectoris: Secondary | ICD-10-CM | POA: Diagnosis not present

## 2022-05-14 DIAGNOSIS — I110 Hypertensive heart disease with heart failure: Secondary | ICD-10-CM | POA: Diagnosis not present

## 2022-05-14 DIAGNOSIS — Z9181 History of falling: Secondary | ICD-10-CM | POA: Diagnosis not present

## 2022-05-14 DIAGNOSIS — S72141D Displaced intertrochanteric fracture of right femur, subsequent encounter for closed fracture with routine healing: Secondary | ICD-10-CM | POA: Diagnosis not present

## 2022-05-14 DIAGNOSIS — M9701XD Periprosthetic fracture around internal prosthetic right hip joint, subsequent encounter: Secondary | ICD-10-CM | POA: Diagnosis not present

## 2022-05-14 DIAGNOSIS — E785 Hyperlipidemia, unspecified: Secondary | ICD-10-CM | POA: Diagnosis not present

## 2022-05-15 ENCOUNTER — Encounter (HOSPITAL_COMMUNITY): Payer: Self-pay | Admitting: Physical Therapy

## 2022-05-15 ENCOUNTER — Ambulatory Visit (HOSPITAL_COMMUNITY): Payer: Medicare Other | Attending: Nurse Practitioner | Admitting: Physical Therapy

## 2022-05-15 DIAGNOSIS — R2689 Other abnormalities of gait and mobility: Secondary | ICD-10-CM | POA: Diagnosis not present

## 2022-05-15 DIAGNOSIS — I509 Heart failure, unspecified: Secondary | ICD-10-CM | POA: Diagnosis not present

## 2022-05-15 DIAGNOSIS — L89619 Pressure ulcer of right heel, unspecified stage: Secondary | ICD-10-CM | POA: Diagnosis not present

## 2022-05-15 DIAGNOSIS — M79671 Pain in right foot: Secondary | ICD-10-CM | POA: Insufficient documentation

## 2022-05-15 DIAGNOSIS — S72141D Displaced intertrochanteric fracture of right femur, subsequent encounter for closed fracture with routine healing: Secondary | ICD-10-CM | POA: Diagnosis not present

## 2022-05-15 DIAGNOSIS — L89610 Pressure ulcer of right heel, unstageable: Secondary | ICD-10-CM | POA: Diagnosis not present

## 2022-05-15 DIAGNOSIS — I110 Hypertensive heart disease with heart failure: Secondary | ICD-10-CM | POA: Diagnosis not present
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
## 2022-05-16 ENCOUNTER — Telehealth (HOSPITAL_COMMUNITY): Payer: Self-pay | Admitting: Physical Therapy

## 2022-05-16 DIAGNOSIS — Z7901 Long term (current) use of anticoagulants: Secondary | ICD-10-CM | POA: Diagnosis not present

## 2022-05-16 DIAGNOSIS — I509 Heart failure, unspecified: Secondary | ICD-10-CM | POA: Diagnosis not present

## 2022-05-16 DIAGNOSIS — I110 Hypertensive heart disease with heart failure: Secondary | ICD-10-CM | POA: Diagnosis not present

## 2022-05-16 DIAGNOSIS — I2510 Atherosclerotic heart disease of native coronary artery without angina pectoris: Secondary | ICD-10-CM | POA: Diagnosis not present

## 2022-05-16 DIAGNOSIS — I482 Chronic atrial fibrillation, unspecified: Secondary | ICD-10-CM | POA: Diagnosis not present

## 2022-05-16 DIAGNOSIS — S72141D Displaced intertrochanteric fracture of right femur, subsequent encounter for closed fracture with routine healing: Secondary | ICD-10-CM | POA: Diagnosis not present

## 2022-05-16 DIAGNOSIS — F32A Depression, unspecified: Secondary | ICD-10-CM | POA: Diagnosis not present

## 2022-05-16 DIAGNOSIS — M9701XD Periprosthetic fracture around internal prosthetic right hip joint, subsequent encounter: Secondary | ICD-10-CM | POA: Diagnosis not present

## 2022-05-16 DIAGNOSIS — Z8673 Personal history of transient ischemic attack (TIA), and cerebral infarction without residual deficits: Secondary | ICD-10-CM | POA: Diagnosis not present

## 2022-05-16 DIAGNOSIS — Z9181 History of falling: Secondary | ICD-10-CM | POA: Diagnosis not present

## 2022-05-16 DIAGNOSIS — L89610 Pressure ulcer of right heel, unstageable: Secondary | ICD-10-CM | POA: Diagnosis not present

## 2022-05-16 DIAGNOSIS — E785 Hyperlipidemia, unspecified: Secondary | ICD-10-CM | POA: Diagnosis not present

## 2022-05-16 DIAGNOSIS — I495 Sick sinus syndrome: Secondary | ICD-10-CM | POA: Diagnosis not present

## 2022-05-16 DIAGNOSIS — G500 Trigeminal neuralgia: Secondary | ICD-10-CM | POA: Diagnosis not present

## 2022-05-16 NOTE — Telephone Encounter (Signed)
Received message from front deck staff that patient's caregiver Cedric Fishman [phone]) was requesting a return phone call to discuss patient's wound and functional mobility. Returned call and she is actually patient's home health PTA for physical therapy. She was wondering if patient should avoid AFO on R foot due to wound. Discussed possibly of not utilizing AFO at this point if it will further apply pressure to heel wound. Also discussed if patient may benefit from [facility] RN for wound care vs. Outpatient PT and she stated she will discuss further with patient.  1:12 PM, 05/16/22 Mearl Latin PT, DPT Physical Therapist at [facility]

## 2022-05-19 ENCOUNTER — Ambulatory Visit (HOSPITAL_COMMUNITY): Payer: Medicare Other | Admitting: Physical Therapy

## 2022-05-19 DIAGNOSIS — R2689 Other abnormalities of gait and mobility: Secondary | ICD-10-CM

## 2022-05-19 DIAGNOSIS — M79671 Pain in right foot: Secondary | ICD-10-CM | POA: Diagnosis not present

## 2022-05-19 DIAGNOSIS — L89619 Pressure ulcer of right heel, unspecified stage: Secondary | ICD-10-CM
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
## 2022-05-20 DIAGNOSIS — L89610 Pressure ulcer of right heel, unstageable: Secondary | ICD-10-CM | POA: Diagnosis not present

## 2022-05-20 DIAGNOSIS — I482 Chronic atrial fibrillation, unspecified: Secondary | ICD-10-CM | POA: Diagnosis not present

## 2022-05-20 DIAGNOSIS — I110 Hypertensive heart disease with heart failure: Secondary | ICD-10-CM | POA: Diagnosis not present

## 2022-05-20 DIAGNOSIS — Z9181 History of falling: Secondary | ICD-10-CM | POA: Diagnosis not present

## 2022-05-20 DIAGNOSIS — I509 Heart failure, unspecified: Secondary | ICD-10-CM | POA: Diagnosis not present

## 2022-05-20 DIAGNOSIS — I495 Sick sinus syndrome: Secondary | ICD-10-CM | POA: Diagnosis not present

## 2022-05-20 DIAGNOSIS — M9701XD Periprosthetic fracture around internal prosthetic right hip joint, subsequent encounter: Secondary | ICD-10-CM | POA: Diagnosis not present

## 2022-05-20 DIAGNOSIS — S72141D Displaced intertrochanteric fracture of right femur, subsequent encounter for closed fracture with routine healing: Secondary | ICD-10-CM | POA: Diagnosis not present

## 2022-05-20 DIAGNOSIS — F32A Depression, unspecified: Secondary | ICD-10-CM | POA: Diagnosis not present

## 2022-05-20 DIAGNOSIS — Z7901 Long term (current) use of anticoagulants: Secondary | ICD-10-CM | POA: Diagnosis not present

## 2022-05-20 DIAGNOSIS — G500 Trigeminal neuralgia: Secondary | ICD-10-CM | POA: Diagnosis not present

## 2022-05-20 DIAGNOSIS — E785 Hyperlipidemia, unspecified: Secondary | ICD-10-CM | POA: Diagnosis not present

## 2022-05-20 DIAGNOSIS — I2510 Atherosclerotic heart disease of native coronary artery without angina pectoris: Secondary | ICD-10-CM | POA: Diagnosis not present

## 2022-05-20 DIAGNOSIS — Z8673 Personal history of transient ischemic attack (TIA), and cerebral infarction without residual deficits: Secondary | ICD-10-CM | POA: Diagnosis not present

## 2022-05-21 ENCOUNTER — Ambulatory Visit (HOSPITAL_COMMUNITY): Payer: Medicare Other | Admitting: Physical Therapy

## 2022-05-21 DIAGNOSIS — R2689 Other abnormalities of gait and mobility: Secondary | ICD-10-CM | POA: Diagnosis not present

## 2022-05-21 DIAGNOSIS — M79671 Pain in right foot: Secondary | ICD-10-CM

## 2022-05-21 DIAGNOSIS — L89619 Pressure ulcer of right heel, unspecified stage: Secondary | ICD-10-CM | POA: Diagnosis not present
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
## 2022-05-23 DIAGNOSIS — I2510 Atherosclerotic heart disease of native coronary artery without angina pectoris: Secondary | ICD-10-CM | POA: Diagnosis not present

## 2022-05-23 DIAGNOSIS — F32A Depression, unspecified: Secondary | ICD-10-CM | POA: Diagnosis not present

## 2022-05-23 DIAGNOSIS — S72141D Displaced intertrochanteric fracture of right femur, subsequent encounter for closed fracture with routine healing: Secondary | ICD-10-CM | POA: Diagnosis not present

## 2022-05-23 DIAGNOSIS — M9701XD Periprosthetic fracture around internal prosthetic right hip joint, subsequent encounter: Secondary | ICD-10-CM | POA: Diagnosis not present

## 2022-05-23 DIAGNOSIS — G500 Trigeminal neuralgia: Secondary | ICD-10-CM | POA: Diagnosis not present

## 2022-05-23 DIAGNOSIS — L89610 Pressure ulcer of right heel, unstageable: Secondary | ICD-10-CM | POA: Diagnosis not present

## 2022-05-23 DIAGNOSIS — I482 Chronic atrial fibrillation, unspecified: Secondary | ICD-10-CM | POA: Diagnosis not present

## 2022-05-23 DIAGNOSIS — E785 Hyperlipidemia, unspecified: Secondary | ICD-10-CM | POA: Diagnosis not present

## 2022-05-23 DIAGNOSIS — I495 Sick sinus syndrome: Secondary | ICD-10-CM | POA: Diagnosis not present

## 2022-05-23 DIAGNOSIS — Z8673 Personal history of transient ischemic attack (TIA), and cerebral infarction without residual deficits: Secondary | ICD-10-CM | POA: Diagnosis not present

## 2022-05-23 DIAGNOSIS — I509 Heart failure, unspecified: Secondary | ICD-10-CM | POA: Diagnosis not present

## 2022-05-23 DIAGNOSIS — Z9181 History of falling: Secondary | ICD-10-CM | POA: Diagnosis not present

## 2022-05-23 DIAGNOSIS — I110 Hypertensive heart disease with heart failure: Secondary | ICD-10-CM | POA: Diagnosis not present

## 2022-05-23 DIAGNOSIS — Z7901 Long term (current) use of anticoagulants: Secondary | ICD-10-CM | POA: Diagnosis not present

## 2022-05-25 DIAGNOSIS — M6281 Muscle weakness (generalized): Secondary | ICD-10-CM | POA: Diagnosis not present

## 2022-05-25 DIAGNOSIS — M9701XD Periprosthetic fracture around internal prosthetic right hip joint, subsequent encounter: Secondary | ICD-10-CM | POA: Diagnosis not present

## 2022-05-25 DIAGNOSIS — R2689 Other abnormalities of gait and mobility: Secondary | ICD-10-CM | POA: Diagnosis not present

## 2022-05-26 ENCOUNTER — Ambulatory Visit (HOSPITAL_COMMUNITY): Payer: Medicare Other | Admitting: Physical Therapy

## 2022-05-28 ENCOUNTER — Ambulatory Visit (HOSPITAL_COMMUNITY): Payer: Medicare Other | Admitting: Physical Therapy

## 2022-05-28 DIAGNOSIS — Z299 Encounter for prophylactic measures, unspecified: Secondary | ICD-10-CM | POA: Diagnosis not present

## 2022-05-28 DIAGNOSIS — M79671 Pain in right foot: Secondary | ICD-10-CM

## 2022-05-28 DIAGNOSIS — L89619 Pressure ulcer of right heel, unspecified stage: Secondary | ICD-10-CM

## 2022-05-28 DIAGNOSIS — R2689 Other abnormalities of gait and mobility: Secondary | ICD-10-CM

## 2022-05-28 DIAGNOSIS — I509 Heart failure, unspecified: Secondary | ICD-10-CM | POA: Diagnosis not present

## 2022-05-28 DIAGNOSIS — L97423 Non-pressure chronic ulcer of left heel and midfoot with necrosis of muscle: Secondary | ICD-10-CM | POA: Diagnosis not present

## 2022-05-28 DIAGNOSIS — I739 Peripheral vascular disease, unspecified: Secondary | ICD-10-CM | POA: Diagnosis not present

## 2022-05-28 DIAGNOSIS — Z6821 Body mass index (BMI) 21.0-21.9, adult: Secondary | ICD-10-CM | POA: Diagnosis not present

## 2022-05-28 DIAGNOSIS — I10 Essential (primary) hypertension: Secondary | ICD-10-CM | POA: Diagnosis not present
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
## 2022-05-29 DIAGNOSIS — I110 Hypertensive heart disease with heart failure: Secondary | ICD-10-CM | POA: Diagnosis not present

## 2022-05-29 DIAGNOSIS — F32A Depression, unspecified: Secondary | ICD-10-CM | POA: Diagnosis not present

## 2022-05-29 DIAGNOSIS — Z7901 Long term (current) use of anticoagulants: Secondary | ICD-10-CM | POA: Diagnosis not present

## 2022-05-29 DIAGNOSIS — I495 Sick sinus syndrome: Secondary | ICD-10-CM | POA: Diagnosis not present

## 2022-05-29 DIAGNOSIS — G500 Trigeminal neuralgia: Secondary | ICD-10-CM | POA: Diagnosis not present

## 2022-05-29 DIAGNOSIS — E785 Hyperlipidemia, unspecified: Secondary | ICD-10-CM | POA: Diagnosis not present

## 2022-05-29 DIAGNOSIS — S72141D Displaced intertrochanteric fracture of right femur, subsequent encounter for closed fracture with routine healing: Secondary | ICD-10-CM | POA: Diagnosis not present

## 2022-05-29 DIAGNOSIS — I482 Chronic atrial fibrillation, unspecified: Secondary | ICD-10-CM | POA: Diagnosis not present

## 2022-05-29 DIAGNOSIS — Z9181 History of falling: Secondary | ICD-10-CM | POA: Diagnosis not present

## 2022-05-29 DIAGNOSIS — I509 Heart failure, unspecified: Secondary | ICD-10-CM | POA: Diagnosis not present

## 2022-05-29 DIAGNOSIS — I2510 Atherosclerotic heart disease of native coronary artery without angina pectoris: Secondary | ICD-10-CM | POA: Diagnosis not present

## 2022-05-29 DIAGNOSIS — M9701XD Periprosthetic fracture around internal prosthetic right hip joint, subsequent encounter: Secondary | ICD-10-CM | POA: Diagnosis not present

## 2022-05-29 DIAGNOSIS — L89610 Pressure ulcer of right heel, unstageable: Secondary | ICD-10-CM | POA: Diagnosis not present

## 2022-05-29 DIAGNOSIS — Z8673 Personal history of transient ischemic attack (TIA), and cerebral infarction without residual deficits: Secondary | ICD-10-CM | POA: Diagnosis not present

## 2022-05-30 DIAGNOSIS — Z1152 Encounter for screening for COVID-19: Secondary | ICD-10-CM | POA: Diagnosis not present

## 2022-05-30 DIAGNOSIS — Z79899 Other long term (current) drug therapy: Secondary | ICD-10-CM | POA: Diagnosis not present

## 2022-05-30 DIAGNOSIS — I509 Heart failure, unspecified: Secondary | ICD-10-CM | POA: Diagnosis not present

## 2022-05-30 DIAGNOSIS — R0902 Hypoxemia: Secondary | ICD-10-CM | POA: Diagnosis not present

## 2022-05-30 DIAGNOSIS — I10 Essential (primary) hypertension: Secondary | ICD-10-CM | POA: Diagnosis not present

## 2022-05-30 DIAGNOSIS — Z20822 Contact with and (suspected) exposure to covid-19: Secondary | ICD-10-CM | POA: Diagnosis not present

## 2022-05-30 DIAGNOSIS — R079 Chest pain, unspecified: Secondary | ICD-10-CM | POA: Diagnosis not present

## 2022-05-30 DIAGNOSIS — J9811 Atelectasis: Secondary | ICD-10-CM | POA: Diagnosis not present

## 2022-05-30 DIAGNOSIS — R531 Weakness: Secondary | ICD-10-CM | POA: Diagnosis not present

## 2022-05-30 DIAGNOSIS — Z888 Allergy status to other drugs, medicaments and biological substances status: Secondary | ICD-10-CM | POA: Diagnosis not present

## 2022-05-30 DIAGNOSIS — J90 Pleural effusion, not elsewhere classified: Secondary | ICD-10-CM | POA: Diagnosis not present

## 2022-05-30 DIAGNOSIS — Z743 Need for continuous supervision: Secondary | ICD-10-CM | POA: Diagnosis not present

## 2022-05-30 DIAGNOSIS — R6889 Other general symptoms and signs: Secondary | ICD-10-CM | POA: Diagnosis not present

## 2022-05-30 DIAGNOSIS — I4891 Unspecified atrial fibrillation: Secondary | ICD-10-CM | POA: Diagnosis not present

## 2022-06-02 DIAGNOSIS — Z7901 Long term (current) use of anticoagulants: Secondary | ICD-10-CM | POA: Diagnosis not present

## 2022-06-02 DIAGNOSIS — I482 Chronic atrial fibrillation, unspecified: Secondary | ICD-10-CM | POA: Diagnosis not present

## 2022-06-02 DIAGNOSIS — G500 Trigeminal neuralgia: Secondary | ICD-10-CM | POA: Diagnosis not present

## 2022-06-02 DIAGNOSIS — E785 Hyperlipidemia, unspecified: Secondary | ICD-10-CM | POA: Diagnosis not present

## 2022-06-02 DIAGNOSIS — Z9181 History of falling: Secondary | ICD-10-CM | POA: Diagnosis not present

## 2022-06-02 DIAGNOSIS — I509 Heart failure, unspecified: Secondary | ICD-10-CM | POA: Diagnosis not present

## 2022-06-02 DIAGNOSIS — L89610 Pressure ulcer of right heel, unstageable: Secondary | ICD-10-CM | POA: Diagnosis not present

## 2022-06-02 DIAGNOSIS — S72141D Displaced intertrochanteric fracture of right femur, subsequent encounter for closed fracture with routine healing: Secondary | ICD-10-CM | POA: Diagnosis not present

## 2022-06-02 DIAGNOSIS — F32A Depression, unspecified: Secondary | ICD-10-CM | POA: Diagnosis not present

## 2022-06-02 DIAGNOSIS — M9701XD Periprosthetic fracture around internal prosthetic right hip joint, subsequent encounter: Secondary | ICD-10-CM | POA: Diagnosis not present

## 2022-06-02 DIAGNOSIS — I2510 Atherosclerotic heart disease of native coronary artery without angina pectoris: Secondary | ICD-10-CM | POA: Diagnosis not present

## 2022-06-02 DIAGNOSIS — I110 Hypertensive heart disease with heart failure: Secondary | ICD-10-CM | POA: Diagnosis not present

## 2022-06-02 DIAGNOSIS — I495 Sick sinus syndrome: Secondary | ICD-10-CM | POA: Diagnosis not present

## 2022-06-02 DIAGNOSIS — Z8673 Personal history of transient ischemic attack (TIA), and cerebral infarction without residual deficits: Secondary | ICD-10-CM | POA: Diagnosis not present

## 2022-06-03 ENCOUNTER — Ambulatory Visit (HOSPITAL_COMMUNITY): Payer: Medicare Other | Admitting: Physical Therapy

## 2022-06-03 DIAGNOSIS — R2689 Other abnormalities of gait and mobility: Secondary | ICD-10-CM | POA: Diagnosis not present

## 2022-06-03 DIAGNOSIS — M79671 Pain in right foot: Secondary | ICD-10-CM

## 2022-06-03 DIAGNOSIS — I509 Heart failure, unspecified: Secondary | ICD-10-CM | POA: Diagnosis not present

## 2022-06-03 DIAGNOSIS — N183 Chronic kidney disease, stage 3 unspecified: Secondary | ICD-10-CM | POA: Diagnosis not present

## 2022-06-03 DIAGNOSIS — L97409 Non-pressure chronic ulcer of unspecified heel and midfoot with unspecified severity: Secondary | ICD-10-CM | POA: Diagnosis not present

## 2022-06-03 DIAGNOSIS — L89619 Pressure ulcer of right heel, unspecified stage: Secondary | ICD-10-CM

## 2022-06-03 DIAGNOSIS — Z299 Encounter for prophylactic measures, unspecified: Secondary | ICD-10-CM | POA: Diagnosis not present

## 2022-06-03 DIAGNOSIS — I10 Essential (primary) hypertension: Secondary | ICD-10-CM | POA: Diagnosis not present
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
## 2022-06-04 DIAGNOSIS — I2510 Atherosclerotic heart disease of native coronary artery without angina pectoris: Secondary | ICD-10-CM | POA: Diagnosis not present

## 2022-06-04 DIAGNOSIS — I509 Heart failure, unspecified: Secondary | ICD-10-CM | POA: Diagnosis not present

## 2022-06-04 DIAGNOSIS — F32A Depression, unspecified: Secondary | ICD-10-CM | POA: Diagnosis not present

## 2022-06-04 DIAGNOSIS — S72141D Displaced intertrochanteric fracture of right femur, subsequent encounter for closed fracture with routine healing: Secondary | ICD-10-CM | POA: Diagnosis not present

## 2022-06-04 DIAGNOSIS — Z7901 Long term (current) use of anticoagulants: Secondary | ICD-10-CM | POA: Diagnosis not present

## 2022-06-04 DIAGNOSIS — I482 Chronic atrial fibrillation, unspecified: Secondary | ICD-10-CM | POA: Diagnosis not present

## 2022-06-04 DIAGNOSIS — E785 Hyperlipidemia, unspecified: Secondary | ICD-10-CM | POA: Diagnosis not present

## 2022-06-04 DIAGNOSIS — I110 Hypertensive heart disease with heart failure: Secondary | ICD-10-CM | POA: Diagnosis not present

## 2022-06-04 DIAGNOSIS — I495 Sick sinus syndrome: Secondary | ICD-10-CM | POA: Diagnosis not present

## 2022-06-04 DIAGNOSIS — G500 Trigeminal neuralgia: Secondary | ICD-10-CM | POA: Diagnosis not present

## 2022-06-04 DIAGNOSIS — L89610 Pressure ulcer of right heel, unstageable: Secondary | ICD-10-CM | POA: Diagnosis not present

## 2022-06-04 DIAGNOSIS — Z9181 History of falling: Secondary | ICD-10-CM | POA: Diagnosis not present

## 2022-06-04 DIAGNOSIS — M9701XD Periprosthetic fracture around internal prosthetic right hip joint, subsequent encounter: Secondary | ICD-10-CM | POA: Diagnosis not present

## 2022-06-04 DIAGNOSIS — Z8673 Personal history of transient ischemic attack (TIA), and cerebral infarction without residual deficits: Secondary | ICD-10-CM | POA: Diagnosis not present

## 2022-06-05 ENCOUNTER — Ambulatory Visit (HOSPITAL_COMMUNITY): Payer: Medicare Other | Admitting: Physical Therapy

## 2022-06-09 ENCOUNTER — Encounter: Payer: Self-pay | Admitting: Internal Medicine

## 2022-06-09 ENCOUNTER — Encounter: Payer: Self-pay | Admitting: *Deleted

## 2022-06-10 ENCOUNTER — Encounter: Payer: Self-pay | Admitting: Internal Medicine

## 2022-06-10 ENCOUNTER — Ambulatory Visit: Payer: Medicare Other | Attending: Internal Medicine | Admitting: Internal Medicine

## 2022-06-10 VITALS — BP 140/80 | HR 67 | Ht 62.0 in | Wt 124.2 lb

## 2022-06-10 DIAGNOSIS — I482 Chronic atrial fibrillation, unspecified: Secondary | ICD-10-CM | POA: Insufficient documentation

## 2022-06-10 DIAGNOSIS — Z8679 Personal history of other diseases of the circulatory system: Secondary | ICD-10-CM | POA: Insufficient documentation

## 2022-06-10 DIAGNOSIS — I2510 Atherosclerotic heart disease of native coronary artery without angina pectoris: Secondary | ICD-10-CM | POA: Insufficient documentation

## 2022-06-10 DIAGNOSIS — E785 Hyperlipidemia, unspecified: Secondary | ICD-10-CM | POA: Diagnosis not present

## 2022-06-10 DIAGNOSIS — Z9889 Other specified postprocedural states: Secondary | ICD-10-CM | POA: Insufficient documentation

## 2022-06-10 DIAGNOSIS — M9701XD Periprosthetic fracture around internal prosthetic right hip joint, subsequent encounter: Secondary | ICD-10-CM | POA: Diagnosis not present

## 2022-06-10 DIAGNOSIS — I739 Peripheral vascular disease, unspecified: Secondary | ICD-10-CM | POA: Insufficient documentation

## 2022-06-10 DIAGNOSIS — M79671 Pain in right foot: Secondary | ICD-10-CM | POA: Insufficient documentation

## 2022-06-10 DIAGNOSIS — I5032 Chronic diastolic (congestive) heart failure: Secondary | ICD-10-CM | POA: Insufficient documentation

## 2022-06-10 DIAGNOSIS — R2689 Other abnormalities of gait and mobility: Secondary | ICD-10-CM | POA: Insufficient documentation

## 2022-06-10 DIAGNOSIS — Z950 Presence of cardiac pacemaker: Secondary | ICD-10-CM | POA: Insufficient documentation

## 2022-06-10 DIAGNOSIS — I10 Essential (primary) hypertension: Secondary | ICD-10-CM | POA: Insufficient documentation

## 2022-06-10 DIAGNOSIS — I110 Hypertensive heart disease with heart failure: Secondary | ICD-10-CM | POA: Diagnosis not present

## 2022-06-10 DIAGNOSIS — Z79899 Other long term (current) drug therapy: Secondary | ICD-10-CM | POA: Insufficient documentation

## 2022-06-10 DIAGNOSIS — I509 Heart failure, unspecified: Secondary | ICD-10-CM | POA: Diagnosis not present

## 2022-06-10 DIAGNOSIS — Z8673 Personal history of transient ischemic attack (TIA), and cerebral infarction without residual deficits: Secondary | ICD-10-CM | POA: Diagnosis not present

## 2022-06-10 DIAGNOSIS — L89619 Pressure ulcer of right heel, unspecified stage: Secondary | ICD-10-CM | POA: Insufficient documentation

## 2022-06-10 DIAGNOSIS — F32A Depression, unspecified: Secondary | ICD-10-CM | POA: Diagnosis not present

## 2022-06-10 DIAGNOSIS — S72141D Displaced intertrochanteric fracture of right femur, subsequent encounter for closed fracture with routine healing: Secondary | ICD-10-CM | POA: Diagnosis not present

## 2022-06-10 DIAGNOSIS — Z7901 Long term (current) use of anticoagulants: Secondary | ICD-10-CM | POA: Diagnosis not present

## 2022-06-10 DIAGNOSIS — Z9181 History of falling: Secondary | ICD-10-CM | POA: Diagnosis not present

## 2022-06-10 DIAGNOSIS — L89610 Pressure ulcer of right heel, unstageable: Secondary | ICD-10-CM | POA: Diagnosis not present

## 2022-06-10 DIAGNOSIS — I495 Sick sinus syndrome: Secondary | ICD-10-CM | POA: Diagnosis not present

## 2022-06-10 DIAGNOSIS — G500 Trigeminal neuralgia: Secondary | ICD-10-CM | POA: Diagnosis not present

## 2022-06-10 NOTE — Patient Instructions (Addendum)
Medication Instructions:  Your physician recommends that you continue on your current medications as directed. Please refer to the Current Medication list given to you today.  Labwork: none  Testing/Procedures: none  Follow-Up: Your physician recommends that you schedule a follow-up appointment in: [facility]  Any Other Special Instructions Will Be Listed Below (If Applicable).  If you need a refill on your cardiac medications before your next appointment, please call your pharmacy.

## 2022-06-10 NOTE — Progress Notes (Signed)
Cardiology Office Note  Date: 06/10/2022   ID: Karina [name], DOB Jan 08, 1923, MRN [id number]  PCP:  [name], MD  Cardiologist:  Chalmers Guest, MD Electrophysiologist:  [name], MD   Reason for Office Visit: Posthospitalization follow-up   History of Present Illness: Karina [name] is a 89 y.o. female known to have chronic A-fib s/p PPM, CAD s/p LAD PCI, fibromuscular dysplasia s/p right renal artery stenting in 1999, AAA status post EVAR, poorly controlled HTN, prediabetes, history of TIA, peripheral neuropathy presented to cardiology clinic for follow-up visit. She was previously followed by [facility] cardiology and wanted to follow-up with cardiology closer to her home.  Accompanied by CNA.  Patient was recently admitted to [facility] with a chief complaint of weakness.  Her blood pressures were poorly controlled and was discharged on clonidine 3 times daily. She continued to have poorly controlled blood pressures at home, ranging between 150 to 180 mmHg SBP. Denies other symptoms of chest pain, DOE, dizziness, lightheadedness, syncope.  Past Medical History:  Diagnosis Date   Actinic keratosis    Bilateral leg weakness 09/27/12   CAD (coronary artery disease)    EF 55% by 2 D Echo 2008 and 2012   Chronic atrial fibrillation (HCC)    Chronic edema 10/15/11   Chronic fatigue    DDD (degenerative disc disease)    Encounter for long-term (current) use of other medications 09/02/12   Endometrial carcinoma ([place])    Fibromuscular dysplasia ([place])    S/P PCI right renal aretery 1999   GERD (gastroesophageal reflux disease)    Hip fracture, right ([place]) 08/06/06   S/P surgery   Hx of cardiovascular stress test 09/2010   Nuclear stress testing showing no evidence of ischemia, EF=76%, No EKG changes & no perfusion defects.   Hx of echocardiogram 05/2012    EF >55%, LA 3.9 cm, mild LVH   Hyperlipidemia    Hypertension    Difficult to control   Hyponatremia     Hypomatremia/SIADH, possibly secondary to Tegretol   LVH (left ventricular hypertrophy)    Mild   Osteoarthritis    Osteoporosis    With Hx of thoracic compression fractures   Pacemaker    Paroxysmal atrial fibrillation (HCC)    Peripheral neuropathy    Peripheral vascular disease ([place])    Pre-diabetes 01/21/11   Chronic   Renovascular hypertension    Tachy-brady syndrome (HCC)    Thoracic compression fracture (HCC)    TIA (transient ischemic attack) 08/2011   OSH: flushing, left LE numbness, CT negative   Tic douloureux 1994   S/P successful surgery   Trigeminal neuralgia of right side of face     Past Surgical History:  Procedure Laterality Date   ABDOMINAL AORTAGRAM  09/05/01   Right & left renals 25%   APPENDECTOMY  1946   CARDIAC CATHETERIZATION  11/05/01   EF 72%. RCA 25%. LCX 50%. Ramus 75/100%. LAD 95%. D2 75%.   [place]   CORONARY ANGIOPLASTY     CORONARY ANGIOPLASTY WITH STENT PLACEMENT  11/05/01   PTCA/Stent to 95% mid LAD, Ramus lesion could not be crossed and was not treated.    DIPYRIDAMOLE STRESS TEST  09/2010   Nuclear stress testing showing no evidence of ischemia, EF=76%, No EKG changes & no perfusion defects.   LUMBAR DISC SURGERY     ORIF FEMUR FRACTURE Right 02/16/2017   Procedure:  OPEN REDUCTION INTERNAL FIXATION (ORIF) DISTAL FEMUR FRACTURE;  Surgeon: Shona Needles, MD;  Location: [facility];  Service: Orthopedics;  Laterality: Right;   RENAL ANGIOGRAM  09/14/97   25% diffuse left renal artery. 50% ostia; right renal artery.   RENAL ARTERY ANGIOPLASTY Right 1994   Renal artery stenosis secondary to fibromusclar dysplasia   RENAL ARTERY ANGIOPLASTY Right 09/13/97   PTA/Stent to ostial right renal artery, No distal fibromuscular hyperplasia   SPINAL FUSION  2002   Percutaneous spinal fusion   TOTAL ABDOMINAL HYSTERECTOMY W/ BILATERAL SALPINGOOPHORECTOMY  1990   VAGINAL HYSTERECTOMY  1990     Current Outpatient Medications  Medication Sig Dispense Refill   apixaban (ELIQUIS) 2.5 MG TABS tablet Take 1 tablet (2.5 mg total) 2 (two) times daily by mouth. 60 tablet 1   benzonatate (TESSALON) 200 MG capsule Take 200 mg by mouth daily as needed.     cloNIDine (CATAPRES) 0.1 MG tablet Take 0.1 mg by mouth 3 (three) times daily.     cycloSPORINE (RESTASIS) 0.05 % ophthalmic emulsion Place 1 drop into both eyes 2 (two) times daily.     diltiazem (TIAZAC) 120 MG 24 hr capsule Take 120 mg by mouth daily.     feeding supplement (ENSURE ENLIVE / ENSURE PLUS) LIQD Take 237 mLs by mouth 2 (two) times daily between meals. 237 mL 12   furosemide (LASIX) 40 MG tablet Take 0.5 tablets (20 mg total) by mouth daily. (Patient taking differently: Take 40 mg by mouth every other day.) 30 tablet 1   Lifitegrast (XIIDRA) 5 % SOLN Apply to eye in the morning and at bedtime.     metoprolol succinate (TOPROL-XL) 25 MG 24 hr tablet Take 25 mg by mouth 2 (two) times daily.     mirtazapine (REMERON SOL-TAB) 15 MG disintegrating tablet Take 15 mg by mouth every morning.     nitroGLYCERIN (NITROSTAT) 0.4 MG SL tablet      QUEtiapine (SEROQUEL) 25 MG tablet Take 1 tablet (25 mg total) by mouth at bedtime.     rosuvastatin (CRESTOR) 5 MG tablet Take 1 tablet (5 mg total) by mouth daily. 30 tablet 3   valsartan (DIOVAN) 80 MG tablet Take 80 mg by mouth daily.     pantoprazole (PROTONIX) 40 MG tablet Take 40 mg by mouth every morning. (Patient not taking: Reported on 06/10/2022)     No current facility-administered medications for this visit.   Allergies:  Ace inhibitors, Amiodarone, Tikosyn [dofetilide], and Meperidine and related   Social History: The patient  reports that she has never smoked. She has never used smokeless tobacco. She reports current alcohol use. She reports that she does not use drugs.   Family History: The patient's family history includes COPD in her brother; Diabetes in her brother;  Hypertension in her brother; Stroke in her father and another family member.   ROS:  Please see the history of present illness. Otherwise, complete review of systems is positive for none.  All other systems are reviewed and negative.   Physical Exam: VS:  BP (!) 140/80 (BP Location: Left Arm, Patient Position: Sitting, Cuff Size: Normal)   Pulse 67   Ht '5\' 2"'$  (1.575 m)   Wt 124 lb 4 oz (56.4 kg)   SpO2 93%   BMI 22.73 kg/m , BMI Body mass index is 22.73 kg/m.  Wt Readings from Last 3 Encounters:  06/10/22 124 lb 4 oz (56.4 kg)  04/29/22 118 lb (53.5 kg)  02/11/22 115  lb 1.3 oz (52.2 kg)    General: Patient appears comfortable at rest. HEENT: Conjunctiva and lids normal, oropharynx clear with moist mucosa. Neck: Supple, no elevated JVP or carotid bruits, no thyromegaly. Lungs: Clear to auscultation, nonlabored breathing at rest. Cardiac: Regular rate and rhythm, no S3 or significant systolic murmur, no pericardial rub. Abdomen: Soft, nontender, no hepatomegaly, bowel sounds present, no guarding or rebound. Extremities: No pitting edema, distal pulses 2+. Skin: Warm and dry. Musculoskeletal: No kyphosis. Neuropsychiatric: Alert and oriented x3, affect grossly appropriate.  ECG: Ventricular paced rhythm, underlying atrial fibrillation rhythm  Recent Labwork: 11/03/2021: TSH 1.154 01/11/2022: B Natriuretic Peptide 314.0 02/05/2022: ALT 15; AST 22; Magnesium 1.7 05/05/2022: BUN 20; Creatinine, Ser 0.97; Hemoglobin 13.5; Platelets 288; Potassium 3.9; Sodium 134  No results found for: "CHOL", "TRIG", "HDL", "CHOLHDL", "VLDL", "LDLCALC", "LDLDIRECT"  Other Studies Reviewed Today: Echocardiogram in 10/2021 LVEF normal RV systolic function normal Biatrial enlargement, severe Mild aortic valve regurgitation  Assessment and Plan: Patient is a 89 year old F known to have chronic A-fib s/p PPM, CAD s/p LAD PCI, fibromuscular dysplasia s/p right renal artery stenting in 1999, AAA status  post EVAR, poorly controlled HTN, prediabetes, history of TIA, peripheral neuropathy presented to cardiology clinic for follow-up visit.  # Generalized body weakness -Has ankle pain, currently follows up with physical therapy and performing ankle exercises. Follow-up with PCP for further management.  # HTN, poorly controlled -Blood pressures not adequately controlled despite on clonidine 0.1 mg 3 times daily along with other antihypertensive medications. Patient reluctant to switch from metoprolol to carvedilol. She said she feels like a [place] pig all these years as everyone tried multiple medications to control blood pressure. Will not make any changes to her medications at this time. She will need close monitoring for blood pressure and recommend referral to advanced HTN clinic. But she wants to make up her mind and reach out to [place] about this.  # CAD status post LAD PCI (remote history) # History of TIA -Not on aspirin due to Eliquis -Continue rosuvastatin 5 mg nightly  # Chronic A-fib s/p Biotronik PPM -Follow-up with EP for remote device interrogations -Continue rate control with metoprolol and Eliquis 2.5 mg twice daily  # AAA s/p EVAR in 2021 -Not on aspirin due to Eliquis use -Continue moderate intensity statin, rosuvastatin 5 mg nightly -Blood pressure goal should be less than 150/90 mmHg (per age)  # Fibromuscular dysplasia s/p R renal artery stenting in 1999 -No intervention  I have spent a total of 30 minutes with patient reviewing chart, EKGs, labs and examining patient as well as establishing an assessment and plan that was discussed with the patient.  > 50% of time was spent in direct patient care.     Medication Adjustments/Labs and Tests Ordered: Current medicines are reviewed at length with the patient today.  Concerns regarding medicines are outlined above.   Tests Ordered: Orders Placed This Encounter  Procedures   Ambulatory referral to Cardiac Electrophysiology     Medication Changes: No orders of the defined types were placed in this encounter.   Disposition:  Follow up with EP  Signed Jeren Dufrane Fidel Levy, MD, 06/10/2022 3:55 PM    [facility], [place], [place] [address]

## 2022-06-11 ENCOUNTER — Ambulatory Visit: Payer: Medicare Other | Attending: Nurse Practitioner | Admitting: Physical Therapy

## 2022-06-11 DIAGNOSIS — Z8679 Personal history of other diseases of the circulatory system: Secondary | ICD-10-CM | POA: Diagnosis not present

## 2022-06-11 DIAGNOSIS — Z79899 Other long term (current) drug therapy: Secondary | ICD-10-CM | POA: Diagnosis not present

## 2022-06-11 DIAGNOSIS — M79671 Pain in right foot: Secondary | ICD-10-CM

## 2022-06-11 DIAGNOSIS — I10 Essential (primary) hypertension: Secondary | ICD-10-CM | POA: Diagnosis not present

## 2022-06-11 DIAGNOSIS — Z950 Presence of cardiac pacemaker: Secondary | ICD-10-CM | POA: Diagnosis not present

## 2022-06-11 DIAGNOSIS — L89619 Pressure ulcer of right heel, unspecified stage: Secondary | ICD-10-CM

## 2022-06-11 DIAGNOSIS — I5032 Chronic diastolic (congestive) heart failure: Secondary | ICD-10-CM | POA: Diagnosis not present

## 2022-06-11 DIAGNOSIS — Z9889 Other specified postprocedural states: Secondary | ICD-10-CM | POA: Diagnosis not present

## 2022-06-11 DIAGNOSIS — I482 Chronic atrial fibrillation, unspecified: Secondary | ICD-10-CM | POA: Diagnosis not present

## 2022-06-11 DIAGNOSIS — E785 Hyperlipidemia, unspecified: Secondary | ICD-10-CM | POA: Diagnosis not present

## 2022-06-11 DIAGNOSIS — I2510 Atherosclerotic heart disease of native coronary artery without angina pectoris: Secondary | ICD-10-CM | POA: Diagnosis not present

## 2022-06-11 DIAGNOSIS — I739 Peripheral vascular disease, unspecified: Secondary | ICD-10-CM | POA: Diagnosis not present

## 2022-06-11 DIAGNOSIS — R2689 Other abnormalities of gait and mobility: Secondary | ICD-10-CM | POA: Diagnosis not present
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
## 2022-06-12 DIAGNOSIS — I10 Essential (primary) hypertension: Secondary | ICD-10-CM | POA: Diagnosis not present

## 2022-06-12 DIAGNOSIS — L97409 Non-pressure chronic ulcer of unspecified heel and midfoot with unspecified severity: Secondary | ICD-10-CM | POA: Diagnosis not present

## 2022-06-12 DIAGNOSIS — Z299 Encounter for prophylactic measures, unspecified: Secondary | ICD-10-CM | POA: Diagnosis not present

## 2022-06-12 DIAGNOSIS — I714 Abdominal aortic aneurysm, without rupture, unspecified: Secondary | ICD-10-CM | POA: Diagnosis not present

## 2022-06-12 DIAGNOSIS — I509 Heart failure, unspecified: Secondary | ICD-10-CM | POA: Diagnosis not present

## 2022-06-12 NOTE — Addendum Note (Signed)
Addended by: Mearl Latin on: 06/12/2022 10:13 AM   Modules accepted: Orders

## 2022-06-16 ENCOUNTER — Ambulatory Visit (HOSPITAL_COMMUNITY): Payer: Medicare Other | Admitting: Physical Therapy

## 2022-06-16 DIAGNOSIS — I10 Essential (primary) hypertension: Secondary | ICD-10-CM | POA: Diagnosis not present

## 2022-06-16 DIAGNOSIS — I2510 Atherosclerotic heart disease of native coronary artery without angina pectoris: Secondary | ICD-10-CM | POA: Diagnosis not present

## 2022-06-16 DIAGNOSIS — I482 Chronic atrial fibrillation, unspecified: Secondary | ICD-10-CM | POA: Diagnosis not present

## 2022-06-16 DIAGNOSIS — E785 Hyperlipidemia, unspecified: Secondary | ICD-10-CM | POA: Diagnosis not present

## 2022-06-16 DIAGNOSIS — R2689 Other abnormalities of gait and mobility: Secondary | ICD-10-CM

## 2022-06-16 DIAGNOSIS — L89619 Pressure ulcer of right heel, unspecified stage: Secondary | ICD-10-CM

## 2022-06-16 DIAGNOSIS — Z79899 Other long term (current) drug therapy: Secondary | ICD-10-CM | POA: Diagnosis not present

## 2022-06-16 DIAGNOSIS — I5032 Chronic diastolic (congestive) heart failure: Secondary | ICD-10-CM | POA: Diagnosis not present

## 2022-06-16 DIAGNOSIS — Z950 Presence of cardiac pacemaker: Secondary | ICD-10-CM | POA: Diagnosis not present

## 2022-06-16 DIAGNOSIS — M79671 Pain in right foot: Secondary | ICD-10-CM | POA: Diagnosis not present

## 2022-06-16 DIAGNOSIS — Z8679 Personal history of other diseases of the circulatory system: Secondary | ICD-10-CM | POA: Diagnosis not present

## 2022-06-16 DIAGNOSIS — I739 Peripheral vascular disease, unspecified: Secondary | ICD-10-CM | POA: Diagnosis not present

## 2022-06-16 DIAGNOSIS — Z9889 Other specified postprocedural states: Secondary | ICD-10-CM | POA: Diagnosis not present
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
## 2022-06-18 DIAGNOSIS — M9701XD Periprosthetic fracture around internal prosthetic right hip joint, subsequent encounter: Secondary | ICD-10-CM | POA: Diagnosis not present

## 2022-06-18 DIAGNOSIS — I110 Hypertensive heart disease with heart failure: Secondary | ICD-10-CM | POA: Diagnosis not present

## 2022-06-18 DIAGNOSIS — S72141D Displaced intertrochanteric fracture of right femur, subsequent encounter for closed fracture with routine healing: Secondary | ICD-10-CM | POA: Diagnosis not present

## 2022-06-18 DIAGNOSIS — E785 Hyperlipidemia, unspecified: Secondary | ICD-10-CM | POA: Diagnosis not present

## 2022-06-18 DIAGNOSIS — Z8673 Personal history of transient ischemic attack (TIA), and cerebral infarction without residual deficits: Secondary | ICD-10-CM | POA: Diagnosis not present

## 2022-06-18 DIAGNOSIS — I495 Sick sinus syndrome: Secondary | ICD-10-CM | POA: Diagnosis not present

## 2022-06-18 DIAGNOSIS — Z7901 Long term (current) use of anticoagulants: Secondary | ICD-10-CM | POA: Diagnosis not present

## 2022-06-18 DIAGNOSIS — F32A Depression, unspecified: Secondary | ICD-10-CM | POA: Diagnosis not present

## 2022-06-18 DIAGNOSIS — I2510 Atherosclerotic heart disease of native coronary artery without angina pectoris: Secondary | ICD-10-CM | POA: Diagnosis not present

## 2022-06-18 DIAGNOSIS — L89610 Pressure ulcer of right heel, unstageable: Secondary | ICD-10-CM | POA: Diagnosis not present

## 2022-06-18 DIAGNOSIS — I509 Heart failure, unspecified: Secondary | ICD-10-CM | POA: Diagnosis not present

## 2022-06-18 DIAGNOSIS — Z9181 History of falling: Secondary | ICD-10-CM | POA: Diagnosis not present

## 2022-06-18 DIAGNOSIS — G500 Trigeminal neuralgia: Secondary | ICD-10-CM | POA: Diagnosis not present

## 2022-06-18 DIAGNOSIS — I482 Chronic atrial fibrillation, unspecified: Secondary | ICD-10-CM | POA: Diagnosis not present

## 2022-06-19 ENCOUNTER — Emergency Department (HOSPITAL_COMMUNITY)
Admission: EM | Admit: 2022-06-19 | Discharge: 2022-06-19 | Disposition: A | Discharge status: Home / Self Care | Payer: Medicare Other | Attending: Emergency Medicine | Admitting: Emergency Medicine

## 2022-06-19 ENCOUNTER — Ambulatory Visit (HOSPITAL_COMMUNITY): Payer: Medicare Other | Admitting: Physical Therapy

## 2022-06-19 ENCOUNTER — Emergency Department (HOSPITAL_COMMUNITY): Payer: Medicare Other

## 2022-06-19 ENCOUNTER — Encounter (HOSPITAL_COMMUNITY): Payer: Self-pay | Admitting: Emergency Medicine

## 2022-06-19 ENCOUNTER — Other Ambulatory Visit: Payer: Self-pay

## 2022-06-19 DIAGNOSIS — I10 Essential (primary) hypertension: Secondary | ICD-10-CM | POA: Insufficient documentation

## 2022-06-19 DIAGNOSIS — Z7901 Long term (current) use of anticoagulants: Secondary | ICD-10-CM | POA: Diagnosis not present

## 2022-06-19 DIAGNOSIS — Z79899 Other long term (current) drug therapy: Secondary | ICD-10-CM | POA: Diagnosis not present

## 2022-06-19 DIAGNOSIS — R0602 Shortness of breath: Secondary | ICD-10-CM | POA: Insufficient documentation

## 2022-06-19 DIAGNOSIS — Z1152 Encounter for screening for COVID-19: Secondary | ICD-10-CM | POA: Diagnosis not present

## 2022-06-19 DIAGNOSIS — R531 Weakness: Secondary | ICD-10-CM | POA: Diagnosis not present

## 2022-06-19 DIAGNOSIS — Z743 Need for continuous supervision: Secondary | ICD-10-CM | POA: Diagnosis not present

## 2022-06-19 DIAGNOSIS — R6889 Other general symptoms and signs: Secondary | ICD-10-CM | POA: Diagnosis not present

## 2022-06-19 DIAGNOSIS — Z950 Presence of cardiac pacemaker: Secondary | ICD-10-CM | POA: Diagnosis not present

## 2022-06-19 LAB — RESP PANEL BY RT-PCR (RSV, FLU A&B, COVID)  RVPGX2
Influenza A by PCR: NEGATIVE
Influenza B by PCR: NEGATIVE
Resp Syncytial Virus by PCR: NEGATIVE
SARS Coronavirus 2 by RT PCR: NEGATIVE

## 2022-06-19 LAB — CBC WITH DIFFERENTIAL/PLATELET
Abs Immature Granulocytes: 0.06 10*3/uL (ref 0.00–0.07)
Basophils Absolute: 0.1 10*3/uL (ref 0.0–0.1)
Basophils Relative: 1 %
Eosinophils Absolute: 0.2 10*3/uL (ref 0.0–0.5)
Eosinophils Relative: 2 %
HCT: 39 % (ref 36.0–46.0)
Hemoglobin: 12.4 g/dL (ref 12.0–15.0)
Immature Granulocytes: 1 %
Lymphocytes Relative: 16 %
Lymphs Abs: 1.4 10*3/uL (ref 0.7–4.0)
MCH: 29.1 pg (ref 26.0–34.0)
MCHC: 31.8 g/dL (ref 30.0–36.0)
MCV: 91.5 fL (ref 80.0–100.0)
Monocytes Absolute: 1.2 10*3/uL — ABNORMAL HIGH (ref 0.1–1.0)
Monocytes Relative: 13 %
Neutro Abs: 6.2 10*3/uL (ref 1.7–7.7)
Neutrophils Relative %: 67 %
Platelets: 274 10*3/uL (ref 150–400)
RBC: 4.26 MIL/uL (ref 3.87–5.11)
RDW: 14.5 % (ref 11.5–15.5)
WBC: 9 10*3/uL (ref 4.0–10.5)
nRBC: 0 % (ref 0.0–0.2)

## 2022-06-19 LAB — COMPREHENSIVE METABOLIC PANEL
ALT: 12 U/L (ref 0–44)
AST: 16 U/L (ref 15–41)
Albumin: 3.6 g/dL (ref 3.5–5.0)
Alkaline Phosphatase: 91 U/L (ref 38–126)
Anion gap: 10 (ref 5–15)
BUN: 16 mg/dL (ref 8–23)
CO2: 25 mmol/L (ref 22–32)
Calcium: 9.6 mg/dL (ref 8.9–10.3)
Chloride: 98 mmol/L (ref 98–111)
Creatinine, Ser: 0.83 mg/dL (ref 0.44–1.00)
GFR, Estimated: >60 mL/min (ref >60)
Glucose, Bld: 102 mg/dL — ABNORMAL HIGH (ref 70–99)
Potassium: 4.7 mmol/L (ref 3.5–5.1)
Sodium: 133 mmol/L — ABNORMAL LOW (ref 135–145)
Total Bilirubin: 0.9 mg/dL (ref 0.3–1.2)
Total Protein: 6.3 g/dL — ABNORMAL LOW (ref 6.5–8.1)

## 2022-06-19 LAB — BRAIN NATRIURETIC PEPTIDE: B Natriuretic Peptide: 596 pg/mL — ABNORMAL HIGH (ref 0.0–100.0)

## 2022-06-19 MED ORDER — FUROSEMIDE 40 MG PO TABS: 20.0000 mg | ORAL_TABLET | Freq: Two times a day (BID) | ORAL | 0 refills | Status: DC

## 2022-06-19 NOTE — ED Triage Notes (Signed)
Family reported pt clutched chest at some point in the day. Pt began to complain of weakness and shortness of breath. EMS reports crackles in posterior bilateral lower lobes.

## 2022-06-19 NOTE — ED Provider Notes (Signed)
[facility] Provider Note   CSN: [id number] Arrival date & time: 06/19/22  1513     History  Chief Complaint  Patient presents with   Shortness of Breath    Karina [name] is a 89 y.o. female.  HPI Patient presents with chest discomfort.  Patient offers a variable history of onset, characteristics, but it seems as though over the past 2 weeks or so she has had some episodic difficulty with breathing at night.  She knowledges multiple medical problems, including hypertension, seemingly takes her medication regularly though she is unable to specify her current Lasix regimen, and may not be taking any. She has seen her physician during this 2 weeks, but notes that she did not ask her physician about her dyspnea. Currently no pain, no lightheadedness, no fever.  She had reported to a friend that she had an episode of pain earlier in the day, but she is dismissive about this when asked about it. She does have known left foot chronic ulceration, unchanged.    Home Medications Prior to Admission medications   Medication Sig Start Date End Date Taking? Authorizing Provider  apixaban (ELIQUIS) 2.5 MG TABS tablet Take 1 tablet (2.5 mg total) 2 (two) times daily by mouth. 02/23/17   Eloise Levels, MD  benzonatate (TESSALON) 200 MG capsule Take 200 mg by mouth daily as needed. 12/04/21   [provider]  cloNIDine (CATAPRES) 0.1 MG tablet Take 0.1 mg by mouth 3 (three) times daily. 05/07/09   [provider]  cycloSPORINE (RESTASIS) 0.05 % ophthalmic emulsion Place 1 drop into both eyes 2 (two) times daily.    [provider]  diltiazem (TIAZAC) 120 MG 24 hr capsule Take 120 mg by mouth daily. 12/12/21   [provider]  feeding supplement (ENSURE ENLIVE / ENSURE PLUS) LIQD Take 237 mLs by mouth 2 (two) times daily between meals. 11/05/21   Orson Eva, MD  furosemide (LASIX) 40 MG tablet Take 0.5 tablets (20 mg total)  by mouth 2 (two) times daily for 7 days. 06/19/22 06/26/22  Carmin Muskrat, MD  Lifitegrast [place]) 5 % SOLN Apply to eye in the morning and at bedtime.    [provider]  metoprolol succinate (TOPROL-XL) 25 MG 24 hr tablet Take 25 mg by mouth 2 (two) times daily. 04/17/21   [provider]  mirtazapine (REMERON SOL-TAB) 15 MG disintegrating tablet Take 15 mg by mouth every morning.    [provider]  nitroGLYCERIN (NITROSTAT) 0.4 MG SL tablet     [provider]  pantoprazole (PROTONIX) 40 MG tablet Take 40 mg by mouth every morning. Patient not taking: Reported on 06/10/2022    [provider]  QUEtiapine (SEROQUEL) 25 MG tablet Take 1 tablet (25 mg total) by mouth at bedtime. 02/10/22   Charlynne Cousins, MD  rosuvastatin (CRESTOR) 5 MG tablet Take 1 tablet (5 mg total) by mouth daily. 12/21/18   Roxan Hockey, MD  valsartan (DIOVAN) 80 MG tablet Take 80 mg by mouth daily.    [provider]      Allergies    Ace inhibitors, Amiodarone, Tikosyn [dofetilide], and Meperidine and related    Review of Systems   Review of Systems  All other systems reviewed and are negative.   Physical Exam Updated Vital Signs BP (!) 168/107   Pulse 89   Temp 98.1 F (36.7 C) (Oral)   Resp 19   Ht '5\' 2"'$  (1.575  m)   Wt 56 kg   SpO2 97%   BMI 22.58 kg/m  Physical Exam Vitals and nursing note reviewed.  Constitutional:      General: She is not in acute distress.    Appearance: She is well-developed.  HENT:     Head: Normocephalic and atraumatic.  Eyes:     Conjunctiva/sclera: Conjunctivae normal.  Cardiovascular:     Rate and Rhythm: Normal rate and regular rhythm.  Pulmonary:     Effort: Pulmonary effort is normal.     Breath sounds: Rhonchi present.  Abdominal:     General: There is no distension.  Skin:    General: Skin is warm and dry.     Comments: No spreading erythema left distal foot wound dressing in place.  Neurological:      Mental Status: She is alert and oriented to person, place, and time.     Cranial Nerves: No cranial nerve deficit.  Psychiatric:        Mood and Affect: Mood normal.     ED Results / Procedures / Treatments   Labs (all labs ordered are listed, but only abnormal results are displayed) Labs Reviewed  COMPREHENSIVE METABOLIC PANEL - Abnormal; Notable for the following components:      Result Value   Sodium 133 (*)    Glucose, Bld 102 (*)    Total Protein 6.3 (*)    All other components within normal limits  BRAIN NATRIURETIC PEPTIDE - Abnormal; Notable for the following components:   B Natriuretic Peptide 596.0 (*)    All other components within normal limits  CBC WITH DIFFERENTIAL/PLATELET - Abnormal; Notable for the following components:   Monocytes Absolute 1.2 (*)    All other components within normal limits  RESP PANEL BY RT-PCR (RSV, FLU A&B, COVID)  [id number]    EKG EKG Interpretation  Date/Time:  Thursday June 19 2022 15:27:56 EDT Ventricular Rate:  87 PR Interval:  100 QRS Duration: 123 QT Interval:  423 QTC Calculation: 509 R Axis:   122 Text Interpretation: Sinus rhythm Short PR interval Nonspecific intraventricular conduction delay Lateral infarct, recent No significant change since last tracing Abnormal ECG Confirmed by Carmin Muskrat [phone]) on 06/19/2022 3:44:36 PM  Radiology DG Chest 2 View  Result Date: 06/19/2022 CLINICAL DATA:  Shortness of breath EXAM: CHEST - 2 VIEW COMPARISON:  Chest radiograph dated January 11, 2022 FINDINGS: The heart size and mediastinal contours are within normal limits. Prominent aortic atherosclerotic calcification and a tortuous course. Left basilar opacity suggesting atelectasis or infiltrate. Osteopenia and multilevel degenerate disc disease of the thoracic spine. No acute osseous abnormality. Pacemaker leads in the right ventricle. IMPRESSION: 1. Left basilar opacity suggesting atelectasis or infiltrate. 2. Prominent aortic  atherosclerotic calcification and a tortuous course, unchanged. Electronically Signed   By: Keane Police D.O.   On: 06/19/2022 16:34    Procedures Procedures    Medications Ordered in ED Medications - No data to display  ED Course/ Medical Decision Making/ A&P                             Medical Decision Making Elderly female with multiple medical issues including hypertension cardiomyopathy, valvular disease and pacemaker placement presents with mild episodic dyspnea, largely at night, negligible currently.  She is awake, alert, afebrile, providing her own history.  Differential includes worsening heart failure, cardiomyopathy, valvular disease, infection, patient started on monitor, pulse oximetry.  Cardiac 90 sinus  normal Pulse ox 97% room air normal   Amount and/or Complexity of Data Reviewed Independent Historian: friend External Data Reviewed: notes.    Details: Echocardiogram from 2023 reviewed, dilation with valvular disease Labs: ordered. Decision-making details documented in ED Course. Radiology: ordered and independent interpretation performed. Decision-making details documented in ED Course. ECG/medicine tests: ordered and independent interpretation performed. Decision-making details documented in ED Course.  Risk Prescription drug management. Decision regarding hospitalization.   6:37 PM On repeat exam the patient is in no distress, is smiling, interactive, we like conversation about her results, need to consistently take Lasix for the next week, follow-up with either primary care or cardiology.  No evidence for pneumonia, with no leukocytosis, no fever, no increased work of breathing, no cough, some suspicion for fluid retention given her inconsistent description of her current Lasix use, and cardiomyopathy on last year's echocardiogram. No evidence of bacteremia, sepsis, no ongoing complaints currently, no new oxygen requirement, patient discharged with close  outpatient follow-up, daily Lasix dosing.        Final Clinical Impression(s) / ED Diagnoses Final diagnoses:  SOB (shortness of breath)    Rx / DC Orders ED Discharge Orders          Ordered    furosemide (LASIX) 40 MG tablet  2 times daily        06/19/22 1837    Ambulatory referral to Cardiology       Comments: If you have not heard from the Cardiology office within the next 72 hours please call [phone].   06/19/22 1837              Carmin Muskrat, MD 06/19/22 [phone]

## 2022-06-19 NOTE — Discharge Instructions (Signed)
As discussed, our cardiology colleagues will contact you for a follow-up visit in the coming days.  Please take your medications as prescribed including today's adjustment in your Lasix dosing.  Return here for concerning changes in your condition.

## 2022-06-23 ENCOUNTER — Ambulatory Visit (HOSPITAL_COMMUNITY): Payer: Medicare Other | Admitting: Physical Therapy

## 2022-06-23 ENCOUNTER — Telehealth: Payer: Self-pay | Admitting: Internal Medicine

## 2022-06-23 ENCOUNTER — Ambulatory Visit: Payer: Medicare Other | Admitting: Nurse Practitioner

## 2022-06-23 DIAGNOSIS — I10 Essential (primary) hypertension: Secondary | ICD-10-CM | POA: Diagnosis not present

## 2022-06-23 DIAGNOSIS — Z950 Presence of cardiac pacemaker: Secondary | ICD-10-CM | POA: Diagnosis not present

## 2022-06-23 DIAGNOSIS — R2689 Other abnormalities of gait and mobility: Secondary | ICD-10-CM

## 2022-06-23 DIAGNOSIS — L89619 Pressure ulcer of right heel, unspecified stage: Secondary | ICD-10-CM

## 2022-06-23 DIAGNOSIS — Z79899 Other long term (current) drug therapy: Secondary | ICD-10-CM | POA: Diagnosis not present

## 2022-06-23 DIAGNOSIS — Z8679 Personal history of other diseases of the circulatory system: Secondary | ICD-10-CM | POA: Diagnosis not present

## 2022-06-23 DIAGNOSIS — Z9889 Other specified postprocedural states: Secondary | ICD-10-CM | POA: Diagnosis not present

## 2022-06-23 DIAGNOSIS — I2510 Atherosclerotic heart disease of native coronary artery without angina pectoris: Secondary | ICD-10-CM | POA: Diagnosis not present

## 2022-06-23 DIAGNOSIS — M79671 Pain in right foot: Secondary | ICD-10-CM | POA: Diagnosis not present

## 2022-06-23 DIAGNOSIS — M6281 Muscle weakness (generalized): Secondary | ICD-10-CM | POA: Diagnosis not present

## 2022-06-23 DIAGNOSIS — I739 Peripheral vascular disease, unspecified: Secondary | ICD-10-CM | POA: Diagnosis not present

## 2022-06-23 DIAGNOSIS — E785 Hyperlipidemia, unspecified: Secondary | ICD-10-CM | POA: Diagnosis not present

## 2022-06-23 DIAGNOSIS — M9701XD Periprosthetic fracture around internal prosthetic right hip joint, subsequent encounter: Secondary | ICD-10-CM | POA: Diagnosis not present

## 2022-06-23 DIAGNOSIS — I482 Chronic atrial fibrillation, unspecified: Secondary | ICD-10-CM | POA: Diagnosis not present

## 2022-06-23 DIAGNOSIS — I5032 Chronic diastolic (congestive) heart failure: Secondary | ICD-10-CM | POA: Diagnosis not present
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
## 2022-06-23 NOTE — Telephone Encounter (Signed)
Per son [name], patient has a wound clinic appointment today at 3:15 pm and wanted to reschedule visit today. Appointment changed to Thursday, 06/26/2022 @4 :00 pm. Verbalized understanding

## 2022-06-23 NOTE — Telephone Encounter (Signed)
Pt's son would like a callback regarding pt's appt today. Please advise.

## 2022-06-25 ENCOUNTER — Ambulatory Visit (HOSPITAL_COMMUNITY): Payer: Medicare Other | Admitting: Physical Therapy

## 2022-06-25 DIAGNOSIS — R2689 Other abnormalities of gait and mobility: Secondary | ICD-10-CM | POA: Diagnosis not present

## 2022-06-25 DIAGNOSIS — Z950 Presence of cardiac pacemaker: Secondary | ICD-10-CM | POA: Diagnosis not present

## 2022-06-25 DIAGNOSIS — M79671 Pain in right foot: Secondary | ICD-10-CM | POA: Diagnosis not present

## 2022-06-25 DIAGNOSIS — I5032 Chronic diastolic (congestive) heart failure: Secondary | ICD-10-CM | POA: Diagnosis not present

## 2022-06-25 DIAGNOSIS — E785 Hyperlipidemia, unspecified: Secondary | ICD-10-CM | POA: Diagnosis not present

## 2022-06-25 DIAGNOSIS — L89619 Pressure ulcer of right heel, unspecified stage: Secondary | ICD-10-CM | POA: Diagnosis not present

## 2022-06-25 DIAGNOSIS — I482 Chronic atrial fibrillation, unspecified: Secondary | ICD-10-CM | POA: Diagnosis not present

## 2022-06-25 DIAGNOSIS — I10 Essential (primary) hypertension: Secondary | ICD-10-CM | POA: Diagnosis not present

## 2022-06-25 DIAGNOSIS — I2510 Atherosclerotic heart disease of native coronary artery without angina pectoris: Secondary | ICD-10-CM | POA: Diagnosis not present

## 2022-06-25 DIAGNOSIS — Z8679 Personal history of other diseases of the circulatory system: Secondary | ICD-10-CM | POA: Diagnosis not present

## 2022-06-25 DIAGNOSIS — Z79899 Other long term (current) drug therapy: Secondary | ICD-10-CM | POA: Diagnosis not present

## 2022-06-25 DIAGNOSIS — Z9889 Other specified postprocedural states: Secondary | ICD-10-CM | POA: Diagnosis not present

## 2022-06-25 DIAGNOSIS — I739 Peripheral vascular disease, unspecified: Secondary | ICD-10-CM | POA: Diagnosis not present
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
## 2022-06-26 ENCOUNTER — Encounter: Payer: Self-pay | Admitting: Nurse Practitioner

## 2022-06-26 ENCOUNTER — Ambulatory Visit: Payer: Medicare Other | Attending: Nurse Practitioner | Admitting: Nurse Practitioner

## 2022-06-26 VITALS — BP 98/74 | HR 82 | Ht 61.5 in | Wt 120.0 lb

## 2022-06-26 DIAGNOSIS — I10 Essential (primary) hypertension: Secondary | ICD-10-CM

## 2022-06-26 DIAGNOSIS — Z950 Presence of cardiac pacemaker: Secondary | ICD-10-CM

## 2022-06-26 DIAGNOSIS — E785 Hyperlipidemia, unspecified: Secondary | ICD-10-CM | POA: Diagnosis not present

## 2022-06-26 DIAGNOSIS — Z9889 Other specified postprocedural states: Secondary | ICD-10-CM | POA: Diagnosis not present

## 2022-06-26 DIAGNOSIS — I482 Chronic atrial fibrillation, unspecified: Secondary | ICD-10-CM

## 2022-06-26 DIAGNOSIS — I2510 Atherosclerotic heart disease of native coronary artery without angina pectoris: Secondary | ICD-10-CM

## 2022-06-26 DIAGNOSIS — I5032 Chronic diastolic (congestive) heart failure: Secondary | ICD-10-CM | POA: Diagnosis not present

## 2022-06-26 DIAGNOSIS — Z79899 Other long term (current) drug therapy: Secondary | ICD-10-CM | POA: Diagnosis not present

## 2022-06-26 DIAGNOSIS — Z8679 Personal history of other diseases of the circulatory system: Secondary | ICD-10-CM

## 2022-06-26 MED ORDER — FUROSEMIDE 20 MG PO TABS: 20.0000 mg | ORAL_TABLET | Freq: Every day | ORAL | 1 refills | Status: DC

## 2022-06-26 NOTE — Patient Instructions (Signed)
Medication Instructions:  Your physician has recommended you make the following change in your medication:  DECREASE lasix to 20 mg once a day, may take additional 20 mg as needed for shortness of breath or swelling. Continue all other medications as directed  Labwork: Labs due in 3 weeks (07/17/22) @ [employer] B[facility]  Testing/Procedures: none  Follow-Up:  Your physician recommends that you schedule a follow-up appointment in: 3-4 months  Any Other Special Instructions Will Be Listed Below (If Applicable).  If you need a refill on your cardiac medications before your next appointment, please call your pharmacy.

## 2022-06-26 NOTE — Progress Notes (Signed)
Cardiology Office Note:    Date:  06/26/2022  ID:  Karina [name], DOB 20-Oct-1922, MRN [id number]  PCP:  [name], [place] [facility] Cardiologist:  Chalmers Guest, MD Electrophysiologist:  [name], MD      Referring MD: Carmin Muskrat, MD   CC: Here for follow-up  History of Present Illness:    Karina [name] is a delightful 89 y.o. female with a hx CAD, s/p remote PCI/ stenting of LAD, longstanding HTN, HLD, AAA without rupture, s/p EVR, chronic A-fib (on Eliquis), chronic diastolic heart failure, bilateral leg weakness, tachybradycardia syndrome, s/p PPM, hx of TIA, peripheral neuropathy, PVD, prediabetes, and chronic hyponatremia.   Followed by Dr. [name]. Previously followed by [facility].    Admitted 09/2021 for TIA noted by facial droop and slurred speech.  CT of brain without acute abnormality.  MRI was not completed due to pacemaker.    ED visit 10/2021 with shortness of breath and bilateral pleural effusions, was diuresed appropriately.  TTE revealed normal EF. Presented back to the ED 01/2022 with right facial numbness and tested positive for COVID.  Presented back to the ED later that month with epistaxis and underwent cauterization.   Admitted to [facility] d/t weakness 05/2022. BP poorly controlled. D/C on clonidine.   Last seen by Dr. Dellia Cloud 06/2022. BP remained poorly controlled; however pt declined medication changes at the time.   Seen at [facility] ED 06/2022 for shortness of breath. Pt unable to specify Lasix regimen. Was d/c on Lasix 40 mg BID, referred to Cardiology.   Today she presents for follow-up. Doing well. Denies any chest pain, shortness of breath, palpitations, syncope, presyncope, dizziness, orthopnea, PND, swelling or significant weight changes, acute bleeding, or claudication. She is drinking a cup of water when I walk into the room, admits to dehydration, but denies any concerning symptoms.   SH: Son is Estate manager/land agent and her late husband was [employer] in [place], [place]. [place] in [place], [place] is named after [employer].   Past Medical History:  Diagnosis Date   Actinic keratosis    Bilateral leg weakness 09/27/12   CAD (coronary artery disease)    EF 55% by 2 D Echo 2008 and 2012   Chronic atrial fibrillation (HCC)    Chronic edema 10/15/11   Chronic fatigue    DDD (degenerative disc disease)    Encounter for long-term (current) use of other medications 09/02/12   Endometrial carcinoma ([place])    Fibromuscular dysplasia ([place])    S/P PCI right renal aretery 1999   GERD (gastroesophageal reflux disease)    Hip fracture, right ([place]) 08/06/06   S/P surgery   Hx of cardiovascular stress test 09/2010   Nuclear stress testing showing no evidence of ischemia, EF=76%, No EKG changes & no perfusion defects.   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   LVH (left ventricular hypertrophy)    Mild   Osteoarthritis    Osteoporosis    With Hx of thoracic compression fractures   Pacemaker    Paroxysmal atrial fibrillation (HCC)    Peripheral neuropathy    Peripheral vascular disease ([place])    Pre-diabetes 01/21/11   Chronic   Renovascular hypertension    Tachy-brady syndrome (HCC)    Thoracic compression fracture ([place])  TIA (transient ischemic attack) 08/2011   OSH: [place], left LE numbness, CT negative   Tic [place] 1994   S/P successful surgery   Trigeminal neuralgia of right side of face     Past Surgical History:  Procedure Laterality Date   ABDOMINAL AORTAGRAM  09/05/01   Right & left renals 25%   APPENDECTOMY  1946   CARDIAC CATHETERIZATION  11/05/01   EF 72%. RCA 25%. LCX 50%. Ramus 75/100%. LAD 95%. D2 75%.   [place]   CORONARY ANGIOPLASTY     CORONARY  ANGIOPLASTY WITH STENT PLACEMENT  11/05/01   PTCA/Stent to 95% mid LAD, Ramus lesion could not be crossed and was not treated.    DIPYRIDAMOLE STRESS TEST  09/2010   Nuclear stress testing showing no evidence of ischemia, EF=76%, No EKG changes & no perfusion defects.   LUMBAR DISC SURGERY     ORIF FEMUR FRACTURE Right 02/16/2017   Procedure: OPEN REDUCTION INTERNAL FIXATION (ORIF) DISTAL FEMUR FRACTURE;  Surgeon: Shona Needles, MD;  Location: [place];  Service: Orthopedics;  Laterality: Right;   RENAL ANGIOGRAM  09/14/97   25% diffuse left renal artery. 50% ostia; right renal artery.   RENAL ARTERY ANGIOPLASTY Right 1994   Renal artery stenosis secondary to fibromusclar dysplasia   RENAL ARTERY ANGIOPLASTY Right 09/13/97   PTA/Stent to ostial right renal artery, No distal fibromuscular hyperplasia   SPINAL FUSION  2002   Percutaneous spinal fusion   TOTAL ABDOMINAL HYSTERECTOMY W/ BILATERAL SALPINGOOPHORECTOMY  1990   VAGINAL HYSTERECTOMY  1990    Current Meds  Medication Sig   apixaban (ELIQUIS) 2.5 MG TABS tablet Take 1 tablet (2.5 mg total) 2 (two) times daily by mouth.   cloNIDine (CATAPRES) 0.1 MG tablet Take 0.1 mg by mouth 3 (three) times daily.   cycloSPORINE (RESTASIS) 0.05 % ophthalmic emulsion Place 1 drop into both eyes 2 (two) times daily.   diltiazem (TIAZAC) 120 MG 24 hr capsule Take 120 mg by mouth daily.   feeding supplement (ENSURE ENLIVE / ENSURE PLUS) LIQD Take 237 mLs by mouth 2 (two) times daily between meals.   Lifitegrast (XIIDRA) 5 % SOLN Apply to eye in the morning and at bedtime.   metoprolol succinate (TOPROL-XL) 25 MG 24 hr tablet Take 25 mg by mouth 2 (two) times daily.   mirtazapine (REMERON SOL-TAB) 15 MG disintegrating tablet Take 15 mg by mouth every morning.   nitroGLYCERIN (NITROSTAT) 0.4 MG SL tablet    QUEtiapine (SEROQUEL) 25 MG tablet Take 1 tablet (25 mg total) by mouth at bedtime.   rosuvastatin (CRESTOR) 5 MG tablet Take 1 tablet (5 mg total) by  mouth daily.   valsartan (DIOVAN) 80 MG tablet Take 80 mg by mouth daily.    furosemide (LASIX) 40 MG tablet Take 0.5 tablets (20 mg total) by mouth 2 (two) times daily for 7 days.    Allergies:   Ace inhibitors, Amiodarone, Tikosyn [dofetilide], and Meperidine and related   Social History   Socioeconomic History   Marital status: Widowed    Spouse name: Not on file   Number of children: Not on file   Years of education: Not on file   Highest education level: Not on file  Occupational History   Not on file  Tobacco Use   Smoking status: Never    Passive exposure: Never   Smokeless tobacco: Never  Vaping Use   Vaping  Use: Never used  Substance and Sexual Activity   Alcohol use: Yes    Alcohol/week: 0.0 standard drinks of alcohol    Comment: Rarely   Drug use: No   Sexual activity: Not Currently  Other Topics Concern   Not on file  Social History Narrative   Not on file   Social Determinants of Health   Financial Resource Strain: Not on file  Food Insecurity: No Food Insecurity (02/06/2022)   Hunger Vital Sign    Worried About Running Out of Food in the Last Year: Never true    Ran Out of Food in the Last Year: Never true  Transportation Needs: No Transportation Needs (02/06/2022)   PRAPARE - Hydrologist (Medical): No    Lack of Transportation (Non-Medical): No  Physical Activity: Not on file  Stress: Not on file  Social Connections: Not on file     Family History: The patient's family history includes COPD in her brother; Diabetes in her brother; Hypertension in her brother; Stroke in her father and another family member.  ROS:     Please see the history of present illness.    All other systems reviewed and are negative.  EKGs/Labs/Other Studies Reviewed:    The following studies were reviewed today:   EKG:  EKG is not ordered today.     Echocardiogram on 11/04/2021: 1. Left ventricular ejection fraction, by estimation, is  55 to 60%. The  left ventricle has normal function. The left ventricle has no regional  wall motion abnormalities. There is mild left ventricular hypertrophy.  Left ventricular diastolic parameters  are indeterminate.   2. Right ventricular systolic function is normal. The right ventricular  size is normal. There is moderately elevated pulmonary artery systolic  pressure.   3. Left atrial size was severely dilated.   4. Right atrial size was severely dilated.   5. Mild mitral valve regurgitation.   6. The aortic valve is tricuspid. Aortic valve regurgitation is mild.  Aortic valve sclerosis/calcification is present, without any evidence of  aortic stenosis.   7. The inferior vena cava is dilated in size with >50% respiratory  variability, suggesting right atrial pressure of 8 mmHg.   Comparison(s): The left ventricular function no significant change. The  left ventricular hypertrophy has improved.  CT abdomen/pelvis on 11/02/2021: IMPRESSION: CTA of the chest: No evidence of pulmonary emboli.   Bilateral pleural effusions with basilar atelectasis.   Chronic compression deformities of T11 and T12 stable from the prior exam.   CT of the abdomen and pelvis: Diverticulosis without diverticulitis.   Abdominal aortic aneurysm with evidence of stent graft therapy without significant change in the size of the aneurysm sac. No endoleak is identified.   Stable pancreatic cyst in the mid body. No further follow-up is recommended given the patient's age.  Recent Labs: 11/03/2021: TSH 1.154 02/05/2022: Magnesium 1.7 06/19/2022: ALT 12; B Natriuretic Peptide 596.0; BUN 16; Creatinine, Ser 0.83; Hemoglobin 12.4; Platelets 274; Potassium 4.7; Sodium 133  Recent Lipid Panel No results found for: "CHOL", "TRIG", "HDL", "CHOLHDL", "VLDL", "LDLCALC", "LDLDIRECT"   Physical Exam:    VS:  BP 98/74 (BP Location: Right Arm, Patient Position: Sitting, Cuff Size: Normal)   Pulse 82   Ht 5' 1.5"  (1.562 m)   Wt 120 lb (54.4 kg)   SpO2 96%   BMI 22.31 kg/m     Wt Readings from Last 3 Encounters:  06/26/22 120 lb (54.4 kg)  06/19/22 123  lb 7.3 oz (56 kg)  06/10/22 124 lb 4 oz (56.4 kg)     GEN: Thin, frail 89 y.o. female in no acute distress HEENT: Normal NECK: No JVD; No carotid bruits CARDIAC: S1/S2, RRR, no murmurs, rubs, gallops; 2+ pulses RESPIRATORY:  Clear to auscultation without rales, wheezing or rhonchi  MUSCULOSKELETAL: no lower extremity edema noted SKIN: Thin skin, warm and dry NEUROLOGIC:  Alert and oriented x 3, some occasional forgetfulness PSYCHIATRIC:  Normal, pleasant affect   ASSESSMENT:    1. Coronary artery disease involving native heart without angina pectoris, unspecified vessel or lesion type   2. Essential hypertension, benign   3. Hyperlipidemia, unspecified hyperlipidemia type   4. S/P aortic aneurysm repair   5. Chronic a-fib ([place])   6. Chronic diastolic CHF (congestive heart failure) ([place])   7. Medication management   8. Pacemaker    PLAN:    In order of problems listed above:  CAD, s/p remote PCI/stenting of LAD Stable with no anginal symptoms. No indication for ischemic evaluation.  Continue current medication regimen.  Heart healthy diet and regular cardiovascular exercise as tolerated encouraged.   HTN Blood pressure 98/74.  Reducing Lasix as mentioned above d/t concern for over-diuresis. Continue rest of medication regimen. Discussed to monitor BP at home at least 2 hours after medications and sitting for 5-10 minutes. Heart healthy diet and regular cardiovascular exercise as tolerated encouraged. Encouraged her to stay well hydrated.  HLD Being managed by PCP. Continue rosuvastatin. Heart healthy diet and regular cardiovascular exercise as tolerated encouraged.   AAA without rupture, s/p EVR CT of the abdomen 10/2021 showed abdominal aortic aneurysm with evidence of stent graft therapy without significant change in size of the  aneurysm sac.  No endoleak was identified.  Patient denies any symptoms.  Will plan on repeat CT imaging of abdomen for monitoring in 10/2022.  Will check BMET prior to testing per protocol.  Chronic A-fib, on Eliquis Patient denies any tachycardia or palpitations.  Continue Toprol-XL.  Continue Eliquis 2.5 mg twice daily, she is on appropriate dosage.  Denies any bleeding issues.  6. Chronic diastolic CHF, medication management TTE 10/2021 showed normal EF, no RWMA, mild LVH. Euvolemic and well compensated on exam.  Continue current medication regimen. Low sodium diet, fluid restriction <2L, and daily weights encouraged. Educated to contact our office for weight gain of 2 lbs overnight or 5 lbs in one week. Will reduce Lasix to 20 mg daily and discussed she make take extra tablet daily PRN for shortness of breath/swelling. Will recheck the following labs: BMET, Mag, and proBNP. Heart healthy diet and regular cardiovascular exercise as tolerated encouraged.   7. Tachybrady syndrome, s/p PPM Denies any symptoms.  Continue to follow-up with the EP.  8. Disposition: Follow-up with Dr. Dellia Cloud or APP in 3-4 months or sooner if anything changes.   Medication Adjustments/Labs and Tests Ordered: Current medicines are reviewed at length with the patient today.  Concerns regarding medicines are outlined above.  Orders Placed This Encounter  Procedures   Basic metabolic panel   Magnesium   Brain natriuretic peptide   Meds ordered this encounter  Medications   furosemide (LASIX) 20 MG tablet    Sig: Take 1 tablet (20 mg total) by mouth daily. May take additional 20 mg as needed for SOB or swelling.    Dispense:  30 tablet    Refill:  1    Patient Instructions  Medication Instructions:  Your physician has recommended you make  the following change in your medication:  DECREASE lasix to 20 mg once a day, may take additional 20 mg as needed for shortness of breath or swelling. Continue all other  medications as directed  Labwork: Labs due in 3 weeks (07/17/22) @ [place] [facility]  Testing/Procedures: none  Follow-Up:  Your physician recommends that you schedule a follow-up appointment in: 3-4 months  Any Other Special Instructions Will Be Listed Below (If Applicable).  If you need a refill on your cardiac medications before your next appointment, please call your pharmacy.    Signed, Finis Bud, NP  06/26/2022 9:56 PM    [place]

## 2022-06-30 ENCOUNTER — Ambulatory Visit (HOSPITAL_COMMUNITY): Payer: Medicare Other | Admitting: Physical Therapy

## 2022-06-30 DIAGNOSIS — E785 Hyperlipidemia, unspecified: Secondary | ICD-10-CM | POA: Diagnosis not present

## 2022-06-30 DIAGNOSIS — M79671 Pain in right foot: Secondary | ICD-10-CM | POA: Diagnosis not present

## 2022-06-30 DIAGNOSIS — Z9889 Other specified postprocedural states: Secondary | ICD-10-CM | POA: Diagnosis not present

## 2022-06-30 DIAGNOSIS — Z8679 Personal history of other diseases of the circulatory system: Secondary | ICD-10-CM | POA: Diagnosis not present

## 2022-06-30 DIAGNOSIS — Z950 Presence of cardiac pacemaker: Secondary | ICD-10-CM | POA: Diagnosis not present

## 2022-06-30 DIAGNOSIS — R2689 Other abnormalities of gait and mobility: Secondary | ICD-10-CM

## 2022-06-30 DIAGNOSIS — I482 Chronic atrial fibrillation, unspecified: Secondary | ICD-10-CM | POA: Diagnosis not present

## 2022-06-30 DIAGNOSIS — Z79899 Other long term (current) drug therapy: Secondary | ICD-10-CM | POA: Diagnosis not present

## 2022-06-30 DIAGNOSIS — L89619 Pressure ulcer of right heel, unspecified stage: Secondary | ICD-10-CM | POA: Diagnosis not present

## 2022-06-30 DIAGNOSIS — I2510 Atherosclerotic heart disease of native coronary artery without angina pectoris: Secondary | ICD-10-CM | POA: Diagnosis not present

## 2022-06-30 DIAGNOSIS — I10 Essential (primary) hypertension: Secondary | ICD-10-CM | POA: Diagnosis not present

## 2022-06-30 DIAGNOSIS — I739 Peripheral vascular disease, unspecified: Secondary | ICD-10-CM | POA: Diagnosis not present

## 2022-06-30 DIAGNOSIS — I5032 Chronic diastolic (congestive) heart failure: Secondary | ICD-10-CM | POA: Diagnosis not present
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
## 2022-07-02 ENCOUNTER — Ambulatory Visit (HOSPITAL_COMMUNITY): Payer: Medicare Other | Admitting: Physical Therapy

## 2022-07-02 DIAGNOSIS — M79671 Pain in right foot: Secondary | ICD-10-CM | POA: Diagnosis not present

## 2022-07-02 DIAGNOSIS — Z9889 Other specified postprocedural states: Secondary | ICD-10-CM | POA: Diagnosis not present

## 2022-07-02 DIAGNOSIS — R2689 Other abnormalities of gait and mobility: Secondary | ICD-10-CM | POA: Diagnosis not present

## 2022-07-02 DIAGNOSIS — I739 Peripheral vascular disease, unspecified: Secondary | ICD-10-CM | POA: Diagnosis not present

## 2022-07-02 DIAGNOSIS — I2510 Atherosclerotic heart disease of native coronary artery without angina pectoris: Secondary | ICD-10-CM | POA: Diagnosis not present

## 2022-07-02 DIAGNOSIS — L89619 Pressure ulcer of right heel, unspecified stage: Secondary | ICD-10-CM | POA: Diagnosis not present

## 2022-07-02 DIAGNOSIS — I10 Essential (primary) hypertension: Secondary | ICD-10-CM | POA: Diagnosis not present

## 2022-07-02 DIAGNOSIS — Z8679 Personal history of other diseases of the circulatory system: Secondary | ICD-10-CM | POA: Diagnosis not present

## 2022-07-02 DIAGNOSIS — I482 Chronic atrial fibrillation, unspecified: Secondary | ICD-10-CM | POA: Diagnosis not present

## 2022-07-02 DIAGNOSIS — Z950 Presence of cardiac pacemaker: Secondary | ICD-10-CM | POA: Diagnosis not present

## 2022-07-02 DIAGNOSIS — E785 Hyperlipidemia, unspecified: Secondary | ICD-10-CM | POA: Diagnosis not present

## 2022-07-02 DIAGNOSIS — I5032 Chronic diastolic (congestive) heart failure: Secondary | ICD-10-CM | POA: Diagnosis not present

## 2022-07-02 DIAGNOSIS — Z79899 Other long term (current) drug therapy: Secondary | ICD-10-CM | POA: Diagnosis not present
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
## 2022-07-07 DIAGNOSIS — H6123 Impacted cerumen, bilateral: Secondary | ICD-10-CM | POA: Diagnosis not present

## 2022-07-08 DIAGNOSIS — R7309 Other abnormal glucose: Secondary | ICD-10-CM | POA: Diagnosis not present

## 2022-07-08 DIAGNOSIS — L97419 Non-pressure chronic ulcer of right heel and midfoot with unspecified severity: Secondary | ICD-10-CM | POA: Diagnosis not present

## 2022-07-08 DIAGNOSIS — I10 Essential (primary) hypertension: Secondary | ICD-10-CM | POA: Diagnosis not present

## 2022-07-08 DIAGNOSIS — E08621 Diabetes mellitus due to underlying condition with foot ulcer: Secondary | ICD-10-CM | POA: Diagnosis not present

## 2022-07-08 DIAGNOSIS — Z299 Encounter for prophylactic measures, unspecified: Secondary | ICD-10-CM | POA: Diagnosis not present

## 2022-07-08 DIAGNOSIS — R7303 Prediabetes: Secondary | ICD-10-CM | POA: Diagnosis not present

## 2022-07-10 ENCOUNTER — Ambulatory Visit (HOSPITAL_COMMUNITY): Payer: Medicare Other | Attending: Nurse Practitioner | Admitting: Physical Therapy

## 2022-07-10 DIAGNOSIS — M79671 Pain in right foot: Secondary | ICD-10-CM | POA: Diagnosis not present

## 2022-07-10 DIAGNOSIS — L89619 Pressure ulcer of right heel, unspecified stage: Secondary | ICD-10-CM

## 2022-07-10 DIAGNOSIS — R2689 Other abnormalities of gait and mobility: Secondary | ICD-10-CM | POA: Diagnosis not present
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
## 2022-07-14 NOTE — Addendum Note (Signed)
Addended by: Wyman Songster on: 07/14/2022 10:13 AM   Modules accepted: Orders

## 2022-07-15 ENCOUNTER — Ambulatory Visit (HOSPITAL_COMMUNITY): Payer: Medicare Other | Admitting: Physical Therapy

## 2022-07-15 DIAGNOSIS — R2689 Other abnormalities of gait and mobility: Secondary | ICD-10-CM | POA: Diagnosis not present

## 2022-07-15 DIAGNOSIS — M79671 Pain in right foot: Secondary | ICD-10-CM | POA: Diagnosis not present

## 2022-07-15 DIAGNOSIS — L89619 Pressure ulcer of right heel, unspecified stage: Secondary | ICD-10-CM | POA: Diagnosis not present
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
## 2022-07-17 ENCOUNTER — Ambulatory Visit (HOSPITAL_COMMUNITY): Payer: Medicare Other | Admitting: Physical Therapy

## 2022-07-17 DIAGNOSIS — L89619 Pressure ulcer of right heel, unspecified stage: Secondary | ICD-10-CM

## 2022-07-17 DIAGNOSIS — M79671 Pain in right foot: Secondary | ICD-10-CM

## 2022-07-17 DIAGNOSIS — R2689 Other abnormalities of gait and mobility: Secondary | ICD-10-CM | POA: Diagnosis not present
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
## 2022-07-22 ENCOUNTER — Ambulatory Visit (HOSPITAL_COMMUNITY): Payer: Medicare Other | Admitting: Physical Therapy

## 2022-07-22 DIAGNOSIS — R2689 Other abnormalities of gait and mobility: Secondary | ICD-10-CM | POA: Diagnosis not present

## 2022-07-22 DIAGNOSIS — M79671 Pain in right foot: Secondary | ICD-10-CM

## 2022-07-22 DIAGNOSIS — L89619 Pressure ulcer of right heel, unspecified stage: Secondary | ICD-10-CM | POA: Diagnosis not present
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
## 2022-07-24 ENCOUNTER — Ambulatory Visit (HOSPITAL_COMMUNITY): Payer: Medicare Other | Admitting: Physical Therapy

## 2022-07-24 DIAGNOSIS — R2689 Other abnormalities of gait and mobility: Secondary | ICD-10-CM | POA: Diagnosis not present

## 2022-07-24 DIAGNOSIS — M9701XD Periprosthetic fracture around internal prosthetic right hip joint, subsequent encounter: Secondary | ICD-10-CM | POA: Diagnosis not present

## 2022-07-24 DIAGNOSIS — M6281 Muscle weakness (generalized): Secondary | ICD-10-CM | POA: Diagnosis not present

## 2022-07-29 ENCOUNTER — Ambulatory Visit (HOSPITAL_COMMUNITY): Payer: Medicare Other | Admitting: Physical Therapy

## 2022-07-29 DIAGNOSIS — M79671 Pain in right foot: Secondary | ICD-10-CM

## 2022-07-29 DIAGNOSIS — L89619 Pressure ulcer of right heel, unspecified stage: Secondary | ICD-10-CM

## 2022-07-29 DIAGNOSIS — R2689 Other abnormalities of gait and mobility: Secondary | ICD-10-CM
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
## 2022-07-30 ENCOUNTER — Ambulatory Visit (HOSPITAL_COMMUNITY): Payer: Medicare Other | Admitting: Physical Therapy

## 2022-07-31 ENCOUNTER — Ambulatory Visit (HOSPITAL_COMMUNITY): Payer: Medicare Other | Admitting: Physical Therapy

## 2022-08-05 ENCOUNTER — Ambulatory Visit (HOSPITAL_COMMUNITY): Payer: Medicare Other | Admitting: Physical Therapy

## 2022-08-05 DIAGNOSIS — R2689 Other abnormalities of gait and mobility: Secondary | ICD-10-CM

## 2022-08-05 DIAGNOSIS — L89619 Pressure ulcer of right heel, unspecified stage: Secondary | ICD-10-CM

## 2022-08-05 DIAGNOSIS — M79671 Pain in right foot: Secondary | ICD-10-CM
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
## 2022-08-06 ENCOUNTER — Ambulatory Visit (HOSPITAL_COMMUNITY): Payer: Medicare Other | Admitting: Physical Therapy

## 2022-08-19 ENCOUNTER — Encounter (HOSPITAL_COMMUNITY): Payer: Self-pay | Admitting: Physical Therapy

## 2022-08-19 ENCOUNTER — Ambulatory Visit (HOSPITAL_COMMUNITY): Payer: Medicare Other | Attending: Nurse Practitioner | Admitting: Physical Therapy

## 2022-08-19 DIAGNOSIS — M79671 Pain in right foot: Secondary | ICD-10-CM | POA: Insufficient documentation

## 2022-08-19 DIAGNOSIS — R2689 Other abnormalities of gait and mobility: Secondary | ICD-10-CM | POA: Diagnosis not present

## 2022-08-19 DIAGNOSIS — L89619 Pressure ulcer of right heel, unspecified stage: Secondary | ICD-10-CM | POA: Insufficient documentation
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
## 2022-08-20 DIAGNOSIS — L97419 Non-pressure chronic ulcer of right heel and midfoot with unspecified severity: Secondary | ICD-10-CM | POA: Diagnosis not present

## 2022-08-20 DIAGNOSIS — E08621 Diabetes mellitus due to underlying condition with foot ulcer: Secondary | ICD-10-CM | POA: Diagnosis not present

## 2022-08-20 DIAGNOSIS — I10 Essential (primary) hypertension: Secondary | ICD-10-CM | POA: Diagnosis not present

## 2022-08-20 DIAGNOSIS — Z299 Encounter for prophylactic measures, unspecified: Secondary | ICD-10-CM | POA: Diagnosis not present

## 2022-08-22 DIAGNOSIS — Z515 Encounter for palliative care: Secondary | ICD-10-CM | POA: Diagnosis not present

## 2022-08-22 DIAGNOSIS — I639 Cerebral infarction, unspecified: Secondary | ICD-10-CM | POA: Diagnosis not present

## 2022-08-23 DIAGNOSIS — R2689 Other abnormalities of gait and mobility: Secondary | ICD-10-CM | POA: Diagnosis not present

## 2022-08-23 DIAGNOSIS — M6281 Muscle weakness (generalized): Secondary | ICD-10-CM | POA: Diagnosis not present

## 2022-08-23 DIAGNOSIS — M9701XD Periprosthetic fracture around internal prosthetic right hip joint, subsequent encounter: Secondary | ICD-10-CM | POA: Diagnosis not present

## 2022-08-25 DIAGNOSIS — M79609 Pain in unspecified limb: Secondary | ICD-10-CM | POA: Diagnosis not present

## 2022-08-28 ENCOUNTER — Ambulatory Visit (HOSPITAL_COMMUNITY): Payer: Medicare Other | Admitting: Physical Therapy

## 2022-08-28 DIAGNOSIS — M79671 Pain in right foot: Secondary | ICD-10-CM

## 2022-08-28 DIAGNOSIS — R2689 Other abnormalities of gait and mobility: Secondary | ICD-10-CM

## 2022-08-28 DIAGNOSIS — L89619 Pressure ulcer of right heel, unspecified stage: Secondary | ICD-10-CM | POA: Diagnosis not present
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
## 2022-09-01 ENCOUNTER — Other Ambulatory Visit: Payer: Self-pay | Admitting: Nurse Practitioner

## 2022-09-03 DIAGNOSIS — I700 Atherosclerosis of aorta: Secondary | ICD-10-CM | POA: Diagnosis not present

## 2022-09-03 DIAGNOSIS — Z7189 Other specified counseling: Secondary | ICD-10-CM | POA: Diagnosis not present

## 2022-09-03 DIAGNOSIS — Z0000 Encounter for general adult medical examination without abnormal findings: Secondary | ICD-10-CM | POA: Diagnosis not present

## 2022-09-03 DIAGNOSIS — I10 Essential (primary) hypertension: Secondary | ICD-10-CM | POA: Diagnosis not present

## 2022-09-03 DIAGNOSIS — Z299 Encounter for prophylactic measures, unspecified: Secondary | ICD-10-CM | POA: Diagnosis not present

## 2022-09-04 ENCOUNTER — Encounter (HOSPITAL_COMMUNITY): Payer: Self-pay | Admitting: Physical Therapy

## 2022-09-04 ENCOUNTER — Ambulatory Visit (HOSPITAL_COMMUNITY): Payer: Medicare Other | Admitting: Physical Therapy

## 2022-09-04 DIAGNOSIS — R2689 Other abnormalities of gait and mobility: Secondary | ICD-10-CM

## 2022-09-04 DIAGNOSIS — M79671 Pain in right foot: Secondary | ICD-10-CM | POA: Diagnosis not present

## 2022-09-04 DIAGNOSIS — L89619 Pressure ulcer of right heel, unspecified stage: Secondary | ICD-10-CM | POA: Diagnosis not present
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
## 2022-09-09 ENCOUNTER — Ambulatory Visit (HOSPITAL_COMMUNITY): Payer: Medicare Other | Attending: Nurse Practitioner | Admitting: Physical Therapy

## 2022-09-09 DIAGNOSIS — R2689 Other abnormalities of gait and mobility: Secondary | ICD-10-CM | POA: Diagnosis not present

## 2022-09-09 DIAGNOSIS — M79671 Pain in right foot: Secondary | ICD-10-CM | POA: Diagnosis not present

## 2022-09-09 DIAGNOSIS — L89619 Pressure ulcer of right heel, unspecified stage: Secondary | ICD-10-CM
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
## 2022-09-17 ENCOUNTER — Ambulatory Visit (HOSPITAL_COMMUNITY): Payer: Medicare Other | Admitting: Physical Therapy

## 2022-09-17 ENCOUNTER — Encounter (HOSPITAL_COMMUNITY): Payer: Self-pay

## 2022-09-17 ENCOUNTER — Ambulatory Visit (HOSPITAL_COMMUNITY): Payer: Medicare Other

## 2022-09-17 DIAGNOSIS — H02135 Senile ectropion of left lower eyelid: Secondary | ICD-10-CM | POA: Diagnosis not present

## 2022-09-17 DIAGNOSIS — L89619 Pressure ulcer of right heel, unspecified stage: Secondary | ICD-10-CM

## 2022-09-17 DIAGNOSIS — M79671 Pain in right foot: Secondary | ICD-10-CM

## 2022-09-17 DIAGNOSIS — R2689 Other abnormalities of gait and mobility: Secondary | ICD-10-CM

## 2022-09-17 DIAGNOSIS — H04123 Dry eye syndrome of bilateral lacrimal glands: Secondary | ICD-10-CM | POA: Diagnosis not present

## 2022-09-17 DIAGNOSIS — H02132 Senile ectropion of right lower eyelid: Secondary | ICD-10-CM | POA: Diagnosis not present
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
## 2022-09-18 ENCOUNTER — Other Ambulatory Visit: Payer: Self-pay

## 2022-09-18 ENCOUNTER — Encounter (HOSPITAL_COMMUNITY): Payer: Self-pay

## 2022-09-18 ENCOUNTER — Emergency Department (HOSPITAL_COMMUNITY)
Admission: EM | Admit: 2022-09-18 | Discharge: 2022-09-18 | Disposition: A | Discharge status: Home / Self Care | Payer: Medicare Other | Attending: Emergency Medicine | Admitting: Emergency Medicine

## 2022-09-18 ENCOUNTER — Emergency Department (HOSPITAL_COMMUNITY): Payer: Medicare Other

## 2022-09-18 DIAGNOSIS — E871 Hypo-osmolality and hyponatremia: Secondary | ICD-10-CM | POA: Insufficient documentation

## 2022-09-18 DIAGNOSIS — R6889 Other general symptoms and signs: Secondary | ICD-10-CM | POA: Diagnosis not present

## 2022-09-18 DIAGNOSIS — M6281 Muscle weakness (generalized): Secondary | ICD-10-CM | POA: Diagnosis not present

## 2022-09-18 DIAGNOSIS — I2510 Atherosclerotic heart disease of native coronary artery without angina pectoris: Secondary | ICD-10-CM | POA: Insufficient documentation

## 2022-09-18 DIAGNOSIS — R531 Weakness: Secondary | ICD-10-CM | POA: Insufficient documentation

## 2022-09-18 DIAGNOSIS — Z7901 Long term (current) use of anticoagulants: Secondary | ICD-10-CM | POA: Insufficient documentation

## 2022-09-18 DIAGNOSIS — Z743 Need for continuous supervision: Secondary | ICD-10-CM | POA: Diagnosis not present

## 2022-09-18 LAB — URINALYSIS, ROUTINE W REFLEX MICROSCOPIC
Bilirubin Urine: NEGATIVE
Glucose, UA: NEGATIVE mg/dL
Hgb urine dipstick: NEGATIVE
Ketones, ur: NEGATIVE mg/dL
Leukocytes,Ua: NEGATIVE
Nitrite: NEGATIVE
Protein, ur: NEGATIVE mg/dL
Specific Gravity, Urine: 1.003 — ABNORMAL LOW (ref 1.005–1.030)
pH: 7 (ref 5.0–8.0)

## 2022-09-18 LAB — CBC
HCT: 36.9 % (ref 36.0–46.0)
Hemoglobin: 12.3 g/dL (ref 12.0–15.0)
MCH: 29.8 pg (ref 26.0–34.0)
MCHC: 33.3 g/dL (ref 30.0–36.0)
MCV: 89.3 fL (ref 80.0–100.0)
Platelets: 227 10*3/uL (ref 150–400)
RBC: 4.13 MIL/uL (ref 3.87–5.11)
RDW: 13.3 % (ref 11.5–15.5)
WBC: 9.3 10*3/uL (ref 4.0–10.5)
nRBC: 0 % (ref 0.0–0.2)

## 2022-09-18 LAB — BASIC METABOLIC PANEL
Anion gap: 8 (ref 5–15)
BUN: 16 mg/dL (ref 8–23)
CO2: 24 mmol/L (ref 22–32)
Calcium: 9.3 mg/dL (ref 8.9–10.3)
Chloride: 95 mmol/L — ABNORMAL LOW (ref 98–111)
Creatinine, Ser: 0.76 mg/dL (ref 0.44–1.00)
GFR, Estimated: >60 mL/min (ref >60)
Glucose, Bld: 116 mg/dL — ABNORMAL HIGH (ref 70–99)
Potassium: 4.4 mmol/L (ref 3.5–5.1)
Sodium: 127 mmol/L — ABNORMAL LOW (ref 135–145)

## 2022-09-18 MED ORDER — SODIUM CHLORIDE 0.9 % IV BOLUS
500.0000 mL | Freq: Once | INTRAVENOUS | Status: AC
  Administered 2022-09-18: 500 mL via INTRAVENOUS

## 2022-09-18 NOTE — Discharge Instructions (Addendum)
Your sodium was mildly low in the emergency department, I recommend rechecking with your primary care doctor some point in the next 1 to 2 weeks to ensure that it is returning to normal level.  Otherwise your workup in the emergency department was reassuring.  I think that your general lower extremity weakness may be somewhat age-related.  No evidence of a stroke today in the emergency department.  In addition, make sure you are drinking enough fluids, include fluids with sodium such as Gatorade can help your low sodium.  Please call your doctor tomorrow or Monday to see if they want you to continue the mirtazapine which can cause low sodium as well.  Discuss this with him before stopping it.

## 2022-09-18 NOTE — ED Triage Notes (Signed)
Patient brought in via EMS for generalized weakness that has been going on for 2 weeks, but became worse over the last day or two, but was at its worse this morning. She denies vision changes, dizziness, changes in speech, and fall.

## 2022-09-18 NOTE — ED Provider Notes (Signed)
[place] EMERGENCY DEPARTMENT AT [facility] Provider Note   CSN: [id number] Arrival date & time: 09/18/22  1422     History  Chief Complaint  Patient presents with   Weakness    Karina [name] is a 89 y.o. female with past medical history significant paroxysmal A-fib chronically anticoagulated on 2.5 mg of Eliquis, CAD, peripheral vascular disease, left ventricular hypertrophy, fibromuscular dysplasia, hyperlipidemia, previous history of tachybradycardia syndrome who presents with some generalized weakness, on additional interview patient does endorse that she has weakness worse in the left as far she can tell, she cannot tell me when the weakness began, intermittently stating yesterday, this morning, few hours prior to arrival.  Best estimate for the last known well would be last night.  She denies any vision changes, dizziness, changes in speech, falls, numbness.   Weakness      Home Medications Prior to Admission medications   Medication Sig Start Date End Date Taking? Authorizing Provider  apixaban (ELIQUIS) 2.5 MG TABS tablet Take 1 tablet (2.5 mg total) 2 (two) times daily by mouth. 02/23/17   Renne Musca, MD  benzonatate (TESSALON) 200 MG capsule Take 200 mg by mouth daily as needed. Patient not taking: Reported on 06/26/2022 12/04/21   [provider]  cloNIDine (CATAPRES) 0.1 MG tablet Take 0.1 mg by mouth 3 (three) times daily. 05/07/09   [provider]  cycloSPORINE (RESTASIS) 0.05 % ophthalmic emulsion Place 1 drop into both eyes 2 (two) times daily.    [provider]  diltiazem (TIAZAC) 120 MG 24 hr capsule Take 120 mg by mouth daily. 12/12/21   [provider]  feeding supplement (ENSURE ENLIVE / ENSURE PLUS) LIQD Take 237 mLs by mouth 2 (two) times daily between meals. 11/05/21   Catarina Hartshorn, MD  furosemide (LASIX) 20 MG tablet TAKE 1 TABLET BY MOUTH DAILY. MAY TAKE 1 extra TABLET AS NEEDED FOR SWELLING OR SHORTNESS OF  BREATH 09/02/22   Sharlene Dory, NP  Lifitegrast Benay Spice) 5 % SOLN Apply to eye in the morning and at bedtime.    [provider]  metoprolol succinate (TOPROL-XL) 25 MG 24 hr tablet Take 25 mg by mouth 2 (two) times daily. 04/17/21   [provider]  mirtazapine (REMERON SOL-TAB) 15 MG disintegrating tablet Take 15 mg by mouth every morning.    [provider]  nitroGLYCERIN (NITROSTAT) 0.4 MG SL tablet     [provider]  pantoprazole (PROTONIX) 40 MG tablet Take 40 mg by mouth every morning. Patient not taking: Reported on 06/10/2022    [provider]  QUEtiapine (SEROQUEL) 25 MG tablet Take 1 tablet (25 mg total) by mouth at bedtime. 02/10/22   Marinda Elk, MD  rosuvastatin (CRESTOR) 5 MG tablet Take 1 tablet (5 mg total) by mouth daily. 12/21/18   Shon Hale, MD  valsartan (DIOVAN) 80 MG tablet Take 80 mg by mouth daily.    [provider]      Allergies    Ace inhibitors, Amiodarone, Tikosyn [dofetilide], and Meperidine and related    Review of Systems   Review of Systems  Neurological:  Positive for weakness.  All other systems reviewed and are negative.   Physical Exam Updated Vital Signs BP (!) 152/85   Pulse 76   Temp 97.7 F (36.5 C) (Oral)   Resp 16   Ht 5\' 2"  (1.575 m)   Wt 53.5 kg   SpO2 95%   BMI 21.58 kg/m  Physical  Exam Vitals and nursing note reviewed.  Constitutional:      General: She is not in acute distress.    Appearance: Normal appearance.  HENT:     Head: Normocephalic and atraumatic.  Eyes:     General:        Right eye: No discharge.        Left eye: No discharge.  Cardiovascular:     Rate and Rhythm: Normal rate and regular rhythm.     Heart sounds: No murmur heard.    No friction rub. No gallop.  Pulmonary:     Effort: Pulmonary effort is normal.     Breath sounds: Normal breath sounds.  Abdominal:     General: Bowel sounds are normal.     Palpations: Abdomen is soft.   Skin:    General: Skin is warm and dry.     Capillary Refill: Capillary refill takes less than 2 seconds.  Neurological:     Mental Status: She is alert and oriented to person, place, and time.     Comments: Cranial nerves II through XII grossly intact.  Intact finger-nose, intact heel-to-shin.  Romberg negative, gait normal.  Alert and oriented x3.  Moves all 4 limbs spontaneously, normal coordination.  No pronator drift.  Intact strength 5 out of 5 bilateral upper extremities, global weakness of bilateral lower extremity strength 4/5 out of 10.  No focal unilateral deficit noted, exam.  Normal coordination to heel-shin, finger-nose-finger.  Patient can ambulate with her walker which is her baseline.    Psychiatric:        Mood and Affect: Mood normal.        Behavior: Behavior normal.     ED Results / Procedures / Treatments   Labs (all labs ordered are listed, but only abnormal results are displayed) Labs Reviewed  BASIC METABOLIC PANEL - Abnormal; Notable for the following components:      Result Value   Sodium 127 (*)    Chloride 95 (*)    Glucose, Bld 116 (*)    All other components within normal limits  URINALYSIS, ROUTINE W REFLEX MICROSCOPIC - Abnormal; Notable for the following components:   Color, Urine STRAW (*)    Specific Gravity, Urine 1.003 (*)    All other components within normal limits  CBC    EKG EKG Interpretation  Date/Time:  Thursday September 18 2022 14:53:49 EDT Ventricular Rate:  74 PR Interval:    QRS Duration: 127 QT Interval:  444 QTC Calculation: 493 R Axis:   124 Text Interpretation: Accelerated junctional rhythm Nonspecific intraventricular conduction delay Lateral infarct, age indeterminate Probable anteroseptal infarct, old No significant change since prior 3/24 Confirmed by Meridee Score [phone]) on 09/18/2022 2:58:55 PM  Radiology CT Head Wo Contrast  Result Date: 09/18/2022 CLINICAL DATA:  Neuro deficit, acute, stroke suspected.  Generalized weakness. EXAM: CT HEAD WITHOUT CONTRAST TECHNIQUE: Contiguous axial images were obtained from the base of the skull through the vertex without intravenous contrast. RADIATION DOSE REDUCTION: This exam was performed according to the departmental dose-optimization program which includes automated exposure control, adjustment of the mA and/or kV according to patient size and/or use of iterative reconstruction technique. COMPARISON:  Head CT 01/11/2022 FINDINGS: Brain: There is no evidence of an acute infarct, intracranial hemorrhage, midline shift, or extra-axial fluid collection. Patchy cerebral white matter hypodensities are unchanged and nonspecific but compatible with moderate chronic small vessel ischemic disease. Mild cerebral atrophy is within limits for age. A 1 cm focus of  dural calcification or possibly densely calcified meningioma in the right middle cranial fossa is unchanged and unlikely to be of any clinical significance. Vascular: Calcified atherosclerosis at the skull base. No suspicious acute vascular hyperdensity. Skull: No acute fracture or suspicious osseous lesion. Sinuses/Orbits: Visualized paranasal sinuses and mastoid air cells are clear. Bilateral cataract extraction. Other: None. IMPRESSION: 1. No evidence of acute intracranial abnormality. 2. Moderate chronic small vessel ischemic disease. Electronically Signed   By: Sebastian Ache M.D.   On: 09/18/2022 15:58    Procedures Procedures    Medications Ordered in ED Medications  sodium chloride 0.9 % bolus 500 mL (0 mLs Intravenous Stopped 09/18/22 1930)    ED Course/ Medical Decision Making/ A&P                             Medical Decision Making Amount and/or Complexity of Data Reviewed Labs: ordered. Radiology: ordered.   This patient is a 89 y.o. female  who presents to the ED for concern of weakness, possibly unilateral, but on report, more consistent with generalized.   Differential diagnoses prior to  evaluation: The emergent differential diagnosis includes, but is not limited to,  CVA, spinal cord injury, ACS, arrhythmia, syncope, orthostatic hypotension, sepsis, hypoglycemia, hypoxia, electrolyte disturbance, endocrine disorder, anemia, environmental exposure, polypharmacy . This is not an exhaustive differential.   Past Medical History / Co-morbidities / Social History:  paroxysmal A-fib chronically anticoagulated on 2.5 mg of Eliquis, CAD, peripheral vascular disease, left ventricular hypertrophy, fibromuscular dysplasia, hyperlipidemia, previous history of tachybradycardia syndrome  Additional history: Chart reviewed. Pertinent results include: Reviewed lab work, imaging from recent previous emergency department visits, outpatient rehabilitation and cardiology visits.  Physical Exam: Physical exam performed. The pertinent findings include: Patient is overall well-appearing, she has some global weakness of bilateral lower extremities, but no focal unilateral deficits.  Her mental status is intact, she is quite responsive with appropriate answers to questions, alert and oriented to self, time, place, situation.  Lab Tests/Imaging studies: I personally interpreted labs/imaging and the pertinent results include: Patient does have mild amount of hyponatremia, sodium 127, may be secondary to her Remeron, will administer a small fluid bolus, and encouraged possibly holding Remeron versus close PCP recheck to discuss medication adjustments.  CBC unremarkable, UA pending at time of handoff.  Independently interpreted CT head without contrast which shows no evidence of acute intracranial abnormality, chronic microvascular disease. I agree with the radiologist interpretation.  Cardiac monitoring: EKG obtained and interpreted by my attending physician which shows: accelerated junctional rhythm, patient has pacemaker in place   Medications: I ordered medication including fluid bolus for hyponatremia.   I have reviewed the patients home medicines and have made adjustments as needed.   9:32 AM Care of Karina [name] transferred to [facility] and Dr. Charm Barges at the end of my shift as the patient will require reassessment once labs/imaging have resulted. Patient presentation, ED course, and plan of care discussed with review of all pertinent labs and imaging. Please see his/her note for further details regarding further ED course and disposition. Plan at time of handoff is dispo with close PCP follow up pending UA resulting. This may be altered or completely changed at the discretion of the oncoming team pending results of further workup.   Final Clinical Impression(s) / ED Diagnoses Final diagnoses:  Weakness  Hyponatremia    Rx / DC Orders ED Discharge Orders     None  Olene Floss, PA-C 09/19/22 0932    Terrilee Files, MD 09/19/22 1040

## 2022-09-18 NOTE — ED Provider Notes (Signed)
  Physical Exam  BP (!) 152/85   Pulse 76   Temp 97.7 F (36.5 C) (Oral)   Resp 16   Ht 5\' 2"  (1.575 m)   Wt 53.5 kg   SpO2 95%   BMI 21.58 kg/m   Physical Exam Vitals and nursing note reviewed.  Constitutional:      General: She is not in acute distress.    Appearance: She is well-developed.  HENT:     Head: Normocephalic and atraumatic.     Mouth/Throat:     Mouth: Mucous membranes are moist.  Eyes:     Conjunctiva/sclera: Conjunctivae normal.  Cardiovascular:     Rate and Rhythm: Normal rate and regular rhythm.     Heart sounds: No murmur heard. Pulmonary:     Effort: Pulmonary effort is normal. No respiratory distress.     Breath sounds: Normal breath sounds.  Abdominal:     Palpations: Abdomen is soft.     Tenderness: There is no abdominal tenderness.  Musculoskeletal:        General: No swelling.     Cervical back: Neck supple.  Skin:    General: Skin is warm and dry.     Capillary Refill: Capillary refill takes less than 2 seconds.  Neurological:     General: No focal deficit present.     Mental Status: She is alert and oriented to person, place, and time.  Psychiatric:        Mood and Affect: Mood normal.     Procedures  Procedures  ED Course / MDM    Medical Decision Making Amount and/or Complexity of Data Reviewed Labs: ordered. Radiology: ordered.   This patient's care was assumed by me at shift change. The tests pending are urinalysis Plan is for discharge after results.  She presents the ED with some generalized weakness, basic labs noted to have sodium of 127.  Patient notes that she does not drink enough fluids, she was given a fluid bolus here to help with this.  She is feeling much better asking to go home at this time.  Her urinalysis is still pending I do note that her urinalysis shows dilute urine despite the report of the patient stating she does not drink a lot of water or fluids in general.  We discussed that she could increase her  intake and increase intake of things such as Gatorade to help her sodium come up.  I reviewed patient's med list as well for possible signs of hyponatremia.  She has noted to be on mirtazapine which could be contributing.  Discussed with patient and her caregiver who is at bedside.  I informed them that they should speak with PCP about this medication and whether or not it is safe to continue.   Patient was re-evaluated by me as well. I discussed their result with them and plan is for discharge.        Karina [name] 09/18/22 2026    Terrilee Files, MD 09/19/22 1036

## 2022-09-19 ENCOUNTER — Ambulatory Visit: Payer: Medicare Other | Admitting: Cardiovascular Disease

## 2022-09-23 ENCOUNTER — Telehealth: Payer: Self-pay

## 2022-09-23 ENCOUNTER — Ambulatory Visit (HOSPITAL_COMMUNITY): Payer: Medicare Other

## 2022-09-23 DIAGNOSIS — M6281 Muscle weakness (generalized): Secondary | ICD-10-CM | POA: Diagnosis not present

## 2022-09-23 DIAGNOSIS — R2689 Other abnormalities of gait and mobility: Secondary | ICD-10-CM | POA: Diagnosis not present

## 2022-09-23 DIAGNOSIS — M9701XD Periprosthetic fracture around internal prosthetic right hip joint, subsequent encounter: Secondary | ICD-10-CM | POA: Diagnosis not present

## 2022-09-23 NOTE — Telephone Encounter (Signed)
  Transition Care Management Follow-up Telephone Call Date of discharge and from where: Karina [name] 6/13 How have you been since you were released from the hospital? Not doing well, she fells like she has had a stroke. Patient feels like going to the PCP wont do any good and has little to no faith in the healthcare system at this point Any questions or concerns? Yes  Items Reviewed: Did the pt receive and understand the discharge instructions provided? Yes  Medications obtained and verified? Yes  Other? No  Any new allergies since your discharge? No  Dietary orders reviewed? No Do you have support at home? Yes    Follow up appointments reviewed:  PCP Hospital f/u appt confirmed? No  Scheduled to see  on  @ . Specialist Hospital f/u appt confirmed? No  Scheduled to see  on  @ . Are transportation arrangements needed? No  If their condition worsens, is the pt aware to call PCP or go to the Emergency Dept.? Yes Was the patient provided with contact information for the PCP's office or ED? Yes Was to pt encouraged to call back with questions or concerns? Yes

## 2022-09-30 ENCOUNTER — Ambulatory Visit (HOSPITAL_COMMUNITY): Payer: Medicare Other | Admitting: Physical Therapy

## 2022-09-30 DIAGNOSIS — R2689 Other abnormalities of gait and mobility: Secondary | ICD-10-CM | POA: Diagnosis not present

## 2022-09-30 DIAGNOSIS — L89619 Pressure ulcer of right heel, unspecified stage: Secondary | ICD-10-CM | POA: Diagnosis not present

## 2022-09-30 DIAGNOSIS — M79671 Pain in right foot: Secondary | ICD-10-CM
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
## 2022-09-30 NOTE — Addendum Note (Signed)
Addended by: Bella Kennedy on: 09/30/2022 05:00 PM   Modules accepted: Orders

## 2022-10-06 ENCOUNTER — Ambulatory Visit (HOSPITAL_COMMUNITY): Payer: Medicare Other | Admitting: Physical Therapy

## 2022-10-10 ENCOUNTER — Ambulatory Visit (INDEPENDENT_AMBULATORY_CARE_PROVIDER_SITE_OTHER): Payer: Medicare Other | Admitting: Cardiovascular Disease

## 2022-10-10 ENCOUNTER — Encounter: Payer: Self-pay | Admitting: Cardiovascular Disease

## 2022-10-10 VITALS — BP 116/78 | HR 82 | Ht 61.5 in | Wt 119.4 lb

## 2022-10-10 DIAGNOSIS — I495 Sick sinus syndrome: Secondary | ICD-10-CM

## 2022-10-10 DIAGNOSIS — R2689 Other abnormalities of gait and mobility: Secondary | ICD-10-CM | POA: Insufficient documentation

## 2022-10-10 DIAGNOSIS — L89619 Pressure ulcer of right heel, unspecified stage: Secondary | ICD-10-CM | POA: Insufficient documentation

## 2022-10-10 DIAGNOSIS — M79671 Pain in right foot: Secondary | ICD-10-CM | POA: Insufficient documentation

## 2022-10-10 NOTE — Patient Instructions (Signed)

## 2022-10-10 NOTE — Progress Notes (Signed)
      HPI Karina [name] returns today for followup. She is a pleasant 89 yo woman with a h/o chronic atrial fib, CAD s/p stenting of the LAD remotely. She is now on a strategy of rate control.   She has a Biotronik pacemaker implanted at [facility] in 2015  BP 116/78   Pulse 82   Ht 5' 1.5" (1.562 m)   Wt 119 lb 6.4 oz (54.2 kg)   SpO2 93%   BMI 22.20 kg/m   Physical Exam:  Gen: Appears comfortable, well-nourished CV: RRR, no dependent edema The device site is normal -- no tenderness, edema, drainage, redness, threatened erosion.  Pulm: breathing easily  DEVICE  Normal device function.  See PaceArt for details.   Assess/Plan:  1. Atrial fib - her VR is controlled.   - on eliquis 2.5 dosed appropriately. Labs 6/24 reviewed 2. CHB - she has an underlying rate in the low 40's today. She is asymptomatic,s/p PPM. 3. PPM -her Biotronik VVIR PPM is working normally. 4. CAD - she denies anginal symptoms.    Maurice Small, MD

## 2022-10-20 ENCOUNTER — Ambulatory Visit (HOSPITAL_COMMUNITY): Payer: Medicare Other

## 2022-10-20 ENCOUNTER — Ambulatory Visit (HOSPITAL_COMMUNITY): Payer: Medicare Other | Admitting: Physical Therapy

## 2022-10-21 ENCOUNTER — Ambulatory Visit: Payer: Medicare Other | Attending: Nurse Practitioner | Admitting: Physical Therapy

## 2022-10-21 DIAGNOSIS — R2689 Other abnormalities of gait and mobility: Secondary | ICD-10-CM

## 2022-10-21 DIAGNOSIS — M79671 Pain in right foot: Secondary | ICD-10-CM

## 2022-10-21 DIAGNOSIS — L89619 Pressure ulcer of right heel, unspecified stage: Secondary | ICD-10-CM | POA: Diagnosis not present

## 2022-10-21 DIAGNOSIS — I495 Sick sinus syndrome: Secondary | ICD-10-CM | POA: Diagnosis not present
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
## 2022-10-23 DIAGNOSIS — M9701XD Periprosthetic fracture around internal prosthetic right hip joint, subsequent encounter: Secondary | ICD-10-CM | POA: Diagnosis not present

## 2022-10-23 DIAGNOSIS — R2689 Other abnormalities of gait and mobility: Secondary | ICD-10-CM | POA: Diagnosis not present

## 2022-10-23 DIAGNOSIS — M6281 Muscle weakness (generalized): Secondary | ICD-10-CM | POA: Diagnosis not present

## 2022-10-30 ENCOUNTER — Ambulatory Visit (HOSPITAL_COMMUNITY): Payer: Medicare Other | Admitting: Physical Therapy

## 2022-10-30 DIAGNOSIS — M79671 Pain in right foot: Secondary | ICD-10-CM | POA: Diagnosis not present

## 2022-10-30 DIAGNOSIS — I495 Sick sinus syndrome: Secondary | ICD-10-CM | POA: Diagnosis not present

## 2022-10-30 DIAGNOSIS — R2689 Other abnormalities of gait and mobility: Secondary | ICD-10-CM | POA: Diagnosis not present

## 2022-10-30 DIAGNOSIS — L89619 Pressure ulcer of right heel, unspecified stage: Secondary | ICD-10-CM | POA: Diagnosis not present
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
## 2022-11-02 ENCOUNTER — Other Ambulatory Visit: Payer: Self-pay | Admitting: Nurse Practitioner

## 2022-11-03 ENCOUNTER — Ambulatory Visit: Payer: Medicare Other | Admitting: Internal Medicine

## 2022-11-03 DIAGNOSIS — Z515 Encounter for palliative care: Secondary | ICD-10-CM | POA: Diagnosis not present

## 2022-11-03 DIAGNOSIS — I639 Cerebral infarction, unspecified: Secondary | ICD-10-CM | POA: Diagnosis not present

## 2022-11-04 ENCOUNTER — Ambulatory Visit: Payer: Medicare Other | Admitting: Internal Medicine

## 2022-11-04 DIAGNOSIS — R2681 Unsteadiness on feet: Secondary | ICD-10-CM | POA: Diagnosis not present

## 2022-11-04 DIAGNOSIS — M6281 Muscle weakness (generalized): Secondary | ICD-10-CM | POA: Diagnosis not present

## 2022-11-05 DIAGNOSIS — I10 Essential (primary) hypertension: Secondary | ICD-10-CM | POA: Diagnosis not present

## 2022-11-05 DIAGNOSIS — Z299 Encounter for prophylactic measures, unspecified: Secondary | ICD-10-CM | POA: Diagnosis not present

## 2022-11-05 DIAGNOSIS — R531 Weakness: Secondary | ICD-10-CM | POA: Diagnosis not present

## 2022-11-06 ENCOUNTER — Ambulatory Visit (HOSPITAL_COMMUNITY): Payer: Medicare Other | Attending: Nurse Practitioner | Admitting: Physical Therapy

## 2022-11-06 DIAGNOSIS — R2689 Other abnormalities of gait and mobility: Secondary | ICD-10-CM | POA: Diagnosis not present

## 2022-11-06 DIAGNOSIS — M79671 Pain in right foot: Secondary | ICD-10-CM | POA: Diagnosis not present

## 2022-11-06 DIAGNOSIS — L89619 Pressure ulcer of right heel, unspecified stage: Secondary | ICD-10-CM | POA: Insufficient documentation
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
## 2022-11-11 DIAGNOSIS — R2681 Unsteadiness on feet: Secondary | ICD-10-CM | POA: Diagnosis not present

## 2022-11-11 DIAGNOSIS — M6281 Muscle weakness (generalized): Secondary | ICD-10-CM | POA: Diagnosis not present

## 2022-11-13 DIAGNOSIS — R2681 Unsteadiness on feet: Secondary | ICD-10-CM | POA: Diagnosis not present

## 2022-11-13 DIAGNOSIS — M6281 Muscle weakness (generalized): Secondary | ICD-10-CM | POA: Diagnosis not present

## 2022-11-14 ENCOUNTER — Encounter (HOSPITAL_COMMUNITY): Payer: Self-pay | Admitting: Physical Therapy

## 2022-11-14 ENCOUNTER — Ambulatory Visit (HOSPITAL_COMMUNITY): Payer: Medicare Other | Admitting: Physical Therapy

## 2022-11-14 DIAGNOSIS — L89619 Pressure ulcer of right heel, unspecified stage: Secondary | ICD-10-CM | POA: Diagnosis not present

## 2022-11-14 DIAGNOSIS — M79671 Pain in right foot: Secondary | ICD-10-CM | POA: Diagnosis not present

## 2022-11-14 DIAGNOSIS — R2689 Other abnormalities of gait and mobility: Secondary | ICD-10-CM
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
## 2022-11-18 DIAGNOSIS — M6281 Muscle weakness (generalized): Secondary | ICD-10-CM | POA: Diagnosis not present

## 2022-11-18 DIAGNOSIS — R2681 Unsteadiness on feet: Secondary | ICD-10-CM | POA: Diagnosis not present

## 2022-11-21 ENCOUNTER — Ambulatory Visit (HOSPITAL_COMMUNITY): Payer: Medicare Other

## 2022-11-23 DIAGNOSIS — M6281 Muscle weakness (generalized): Secondary | ICD-10-CM | POA: Diagnosis not present

## 2022-11-23 DIAGNOSIS — R2689 Other abnormalities of gait and mobility: Secondary | ICD-10-CM | POA: Diagnosis not present

## 2022-11-23 DIAGNOSIS — M9701XD Periprosthetic fracture around internal prosthetic right hip joint, subsequent encounter: Secondary | ICD-10-CM | POA: Diagnosis not present

## 2022-11-24 DIAGNOSIS — R2681 Unsteadiness on feet: Secondary | ICD-10-CM | POA: Diagnosis not present

## 2022-11-24 DIAGNOSIS — M6281 Muscle weakness (generalized): Secondary | ICD-10-CM | POA: Diagnosis not present

## 2022-11-26 ENCOUNTER — Ambulatory Visit (HOSPITAL_COMMUNITY): Payer: Medicare Other | Admitting: Physical Therapy

## 2022-11-26 DIAGNOSIS — M6281 Muscle weakness (generalized): Secondary | ICD-10-CM | POA: Diagnosis not present

## 2022-11-26 DIAGNOSIS — R2681 Unsteadiness on feet: Secondary | ICD-10-CM | POA: Diagnosis not present

## 2022-11-28 ENCOUNTER — Ambulatory Visit (HOSPITAL_COMMUNITY): Payer: Medicare Other | Admitting: Physical Therapy

## 2022-12-02 DIAGNOSIS — M6281 Muscle weakness (generalized): Secondary | ICD-10-CM | POA: Diagnosis not present

## 2022-12-02 DIAGNOSIS — R2681 Unsteadiness on feet: Secondary | ICD-10-CM | POA: Diagnosis not present

## 2022-12-04 DIAGNOSIS — M6281 Muscle weakness (generalized): Secondary | ICD-10-CM | POA: Diagnosis not present

## 2022-12-04 DIAGNOSIS — R2681 Unsteadiness on feet: Secondary | ICD-10-CM | POA: Diagnosis not present

## 2022-12-16 DIAGNOSIS — R350 Frequency of micturition: Secondary | ICD-10-CM | POA: Diagnosis not present

## 2022-12-16 DIAGNOSIS — I10 Essential (primary) hypertension: Secondary | ICD-10-CM | POA: Diagnosis not present

## 2022-12-16 DIAGNOSIS — Z0000 Encounter for general adult medical examination without abnormal findings: Secondary | ICD-10-CM | POA: Diagnosis not present

## 2022-12-16 DIAGNOSIS — Z299 Encounter for prophylactic measures, unspecified: Secondary | ICD-10-CM | POA: Diagnosis not present

## 2022-12-16 DIAGNOSIS — R5383 Other fatigue: Secondary | ICD-10-CM | POA: Diagnosis not present

## 2022-12-18 DIAGNOSIS — M6281 Muscle weakness (generalized): Secondary | ICD-10-CM | POA: Diagnosis not present

## 2022-12-18 DIAGNOSIS — R2681 Unsteadiness on feet: Secondary | ICD-10-CM | POA: Diagnosis not present

## 2022-12-24 DIAGNOSIS — M6281 Muscle weakness (generalized): Secondary | ICD-10-CM | POA: Diagnosis not present

## 2022-12-24 DIAGNOSIS — R2689 Other abnormalities of gait and mobility: Secondary | ICD-10-CM | POA: Diagnosis not present

## 2022-12-24 DIAGNOSIS — M9701XD Periprosthetic fracture around internal prosthetic right hip joint, subsequent encounter: Secondary | ICD-10-CM | POA: Diagnosis not present

## 2022-12-25 DIAGNOSIS — R2681 Unsteadiness on feet: Secondary | ICD-10-CM | POA: Diagnosis not present

## 2022-12-25 DIAGNOSIS — M6281 Muscle weakness (generalized): Secondary | ICD-10-CM | POA: Diagnosis not present

## 2022-12-30 DIAGNOSIS — M6281 Muscle weakness (generalized): Secondary | ICD-10-CM | POA: Diagnosis not present

## 2022-12-30 DIAGNOSIS — R2681 Unsteadiness on feet: Secondary | ICD-10-CM | POA: Diagnosis not present

## 2023-01-01 DIAGNOSIS — M6281 Muscle weakness (generalized): Secondary | ICD-10-CM | POA: Diagnosis not present

## 2023-01-01 DIAGNOSIS — R2681 Unsteadiness on feet: Secondary | ICD-10-CM | POA: Diagnosis not present

## 2023-01-05 DIAGNOSIS — I639 Cerebral infarction, unspecified: Secondary | ICD-10-CM | POA: Diagnosis not present

## 2023-01-05 DIAGNOSIS — Z515 Encounter for palliative care: Secondary | ICD-10-CM | POA: Diagnosis not present

## 2023-01-06 DIAGNOSIS — M6281 Muscle weakness (generalized): Secondary | ICD-10-CM | POA: Diagnosis not present

## 2023-01-06 DIAGNOSIS — R2681 Unsteadiness on feet: Secondary | ICD-10-CM | POA: Diagnosis not present

## 2023-01-09 ENCOUNTER — Encounter: Payer: Medicare Other | Admitting: Cardiovascular Disease

## 2023-01-09 DIAGNOSIS — I509 Heart failure, unspecified: Secondary | ICD-10-CM | POA: Diagnosis not present

## 2023-01-09 DIAGNOSIS — N183 Chronic kidney disease, stage 3 unspecified: Secondary | ICD-10-CM | POA: Diagnosis not present

## 2023-01-09 DIAGNOSIS — Z299 Encounter for prophylactic measures, unspecified: Secondary | ICD-10-CM | POA: Diagnosis not present

## 2023-01-09 DIAGNOSIS — Z23 Encounter for immunization: Secondary | ICD-10-CM | POA: Diagnosis not present

## 2023-01-09 DIAGNOSIS — I4891 Unspecified atrial fibrillation: Secondary | ICD-10-CM | POA: Diagnosis not present

## 2023-01-09 DIAGNOSIS — I10 Essential (primary) hypertension: Secondary | ICD-10-CM | POA: Diagnosis not present

## 2023-01-09 DIAGNOSIS — R7303 Prediabetes: Secondary | ICD-10-CM | POA: Diagnosis not present

## 2023-01-13 DIAGNOSIS — R7303 Prediabetes: Secondary | ICD-10-CM | POA: Diagnosis not present

## 2023-01-13 DIAGNOSIS — R2681 Unsteadiness on feet: Secondary | ICD-10-CM | POA: Diagnosis not present

## 2023-01-13 DIAGNOSIS — M6281 Muscle weakness (generalized): Secondary | ICD-10-CM | POA: Diagnosis not present

## 2023-01-20 DIAGNOSIS — H04123 Dry eye syndrome of bilateral lacrimal glands: Secondary | ICD-10-CM | POA: Diagnosis not present

## 2023-01-20 DIAGNOSIS — H02132 Senile ectropion of right lower eyelid: Secondary | ICD-10-CM | POA: Diagnosis not present

## 2023-01-20 DIAGNOSIS — H02135 Senile ectropion of left lower eyelid: Secondary | ICD-10-CM | POA: Diagnosis not present

## 2023-01-23 DIAGNOSIS — M6281 Muscle weakness (generalized): Secondary | ICD-10-CM | POA: Diagnosis not present

## 2023-01-23 DIAGNOSIS — R2689 Other abnormalities of gait and mobility: Secondary | ICD-10-CM | POA: Diagnosis not present

## 2023-01-23 DIAGNOSIS — R2681 Unsteadiness on feet: Secondary | ICD-10-CM | POA: Diagnosis not present

## 2023-01-23 DIAGNOSIS — M9701XD Periprosthetic fracture around internal prosthetic right hip joint, subsequent encounter: Secondary | ICD-10-CM | POA: Diagnosis not present

## 2023-01-27 DIAGNOSIS — R2681 Unsteadiness on feet: Secondary | ICD-10-CM | POA: Diagnosis not present

## 2023-01-27 DIAGNOSIS — M6281 Muscle weakness (generalized): Secondary | ICD-10-CM | POA: Diagnosis not present

## 2023-02-06 DIAGNOSIS — R2681 Unsteadiness on feet: Secondary | ICD-10-CM | POA: Diagnosis not present

## 2023-02-06 DIAGNOSIS — M6281 Muscle weakness (generalized): Secondary | ICD-10-CM | POA: Diagnosis not present

## 2023-02-13 DIAGNOSIS — M6281 Muscle weakness (generalized): Secondary | ICD-10-CM | POA: Diagnosis not present

## 2023-02-13 DIAGNOSIS — R2681 Unsteadiness on feet: Secondary | ICD-10-CM | POA: Diagnosis not present

## 2023-02-17 DIAGNOSIS — R2681 Unsteadiness on feet: Secondary | ICD-10-CM | POA: Diagnosis not present

## 2023-02-17 DIAGNOSIS — M6281 Muscle weakness (generalized): Secondary | ICD-10-CM | POA: Diagnosis not present

## 2023-02-23 DIAGNOSIS — I639 Cerebral infarction, unspecified: Secondary | ICD-10-CM | POA: Diagnosis not present

## 2023-02-23 DIAGNOSIS — Z515 Encounter for palliative care: Secondary | ICD-10-CM | POA: Diagnosis not present

## 2023-02-23 DIAGNOSIS — R2689 Other abnormalities of gait and mobility: Secondary | ICD-10-CM | POA: Diagnosis not present

## 2023-02-23 DIAGNOSIS — M6281 Muscle weakness (generalized): Secondary | ICD-10-CM | POA: Diagnosis not present

## 2023-02-23 DIAGNOSIS — M9701XD Periprosthetic fracture around internal prosthetic right hip joint, subsequent encounter: Secondary | ICD-10-CM | POA: Diagnosis not present

## 2023-02-24 DIAGNOSIS — M6281 Muscle weakness (generalized): Secondary | ICD-10-CM | POA: Diagnosis not present

## 2023-02-24 DIAGNOSIS — R2681 Unsteadiness on feet: Secondary | ICD-10-CM | POA: Diagnosis not present

## 2023-03-10 DIAGNOSIS — R2681 Unsteadiness on feet: Secondary | ICD-10-CM | POA: Diagnosis not present

## 2023-03-10 DIAGNOSIS — M6281 Muscle weakness (generalized): Secondary | ICD-10-CM | POA: Diagnosis not present

## 2023-03-12 DIAGNOSIS — I509 Heart failure, unspecified: Secondary | ICD-10-CM | POA: Diagnosis not present

## 2023-03-12 DIAGNOSIS — Z299 Encounter for prophylactic measures, unspecified: Secondary | ICD-10-CM | POA: Diagnosis not present

## 2023-03-12 DIAGNOSIS — I10 Essential (primary) hypertension: Secondary | ICD-10-CM | POA: Diagnosis not present

## 2023-03-12 DIAGNOSIS — R531 Weakness: Secondary | ICD-10-CM | POA: Diagnosis not present

## 2023-03-25 DIAGNOSIS — M6281 Muscle weakness (generalized): Secondary | ICD-10-CM | POA: Diagnosis not present

## 2023-03-25 DIAGNOSIS — R2689 Other abnormalities of gait and mobility: Secondary | ICD-10-CM | POA: Diagnosis not present

## 2023-03-25 DIAGNOSIS — M9701XD Periprosthetic fracture around internal prosthetic right hip joint, subsequent encounter: Secondary | ICD-10-CM | POA: Diagnosis not present

## 2023-04-05 ENCOUNTER — Emergency Department (HOSPITAL_COMMUNITY): Payer: Medicare Other

## 2023-04-05 ENCOUNTER — Encounter (HOSPITAL_COMMUNITY): Payer: Self-pay | Admitting: Emergency Medicine

## 2023-04-05 ENCOUNTER — Other Ambulatory Visit: Payer: Self-pay

## 2023-04-05 ENCOUNTER — Emergency Department (HOSPITAL_COMMUNITY)
Admission: EM | Admit: 2023-04-05 | Discharge: 2023-04-05 | Disposition: A | Discharge status: Home / Self Care | Payer: Medicare Other | Attending: Emergency Medicine | Admitting: Emergency Medicine

## 2023-04-05 DIAGNOSIS — R6889 Other general symptoms and signs: Secondary | ICD-10-CM | POA: Diagnosis not present

## 2023-04-05 DIAGNOSIS — R0602 Shortness of breath: Secondary | ICD-10-CM | POA: Diagnosis not present

## 2023-04-05 DIAGNOSIS — J90 Pleural effusion, not elsewhere classified: Secondary | ICD-10-CM | POA: Diagnosis not present

## 2023-04-05 DIAGNOSIS — E119 Type 2 diabetes mellitus without complications: Secondary | ICD-10-CM | POA: Insufficient documentation

## 2023-04-05 DIAGNOSIS — Z743 Need for continuous supervision: Secondary | ICD-10-CM | POA: Diagnosis not present

## 2023-04-05 DIAGNOSIS — I7143 Infrarenal abdominal aortic aneurysm, without rupture: Secondary | ICD-10-CM | POA: Diagnosis not present

## 2023-04-05 DIAGNOSIS — Z79899 Other long term (current) drug therapy: Secondary | ICD-10-CM | POA: Diagnosis not present

## 2023-04-05 DIAGNOSIS — R109 Unspecified abdominal pain: Secondary | ICD-10-CM | POA: Diagnosis not present

## 2023-04-05 DIAGNOSIS — Z7901 Long term (current) use of anticoagulants: Secondary | ICD-10-CM | POA: Insufficient documentation

## 2023-04-05 DIAGNOSIS — R0689 Other abnormalities of breathing: Secondary | ICD-10-CM | POA: Diagnosis not present

## 2023-04-05 DIAGNOSIS — I10 Essential (primary) hypertension: Secondary | ICD-10-CM | POA: Insufficient documentation

## 2023-04-05 DIAGNOSIS — R1033 Periumbilical pain: Secondary | ICD-10-CM | POA: Diagnosis not present

## 2023-04-05 DIAGNOSIS — K5730 Diverticulosis of large intestine without perforation or abscess without bleeding: Secondary | ICD-10-CM | POA: Diagnosis not present

## 2023-04-05 DIAGNOSIS — K769 Liver disease, unspecified: Secondary | ICD-10-CM | POA: Diagnosis not present

## 2023-04-05 DIAGNOSIS — R918 Other nonspecific abnormal finding of lung field: Secondary | ICD-10-CM | POA: Diagnosis not present

## 2023-04-05 LAB — CBC WITH DIFFERENTIAL/PLATELET
Abs Immature Granulocytes: 0.05 10*3/uL (ref 0.00–0.07)
Basophils Absolute: 0.1 10*3/uL (ref 0.0–0.1)
Basophils Relative: 1 %
Eosinophils Absolute: 0.2 10*3/uL (ref 0.0–0.5)
Eosinophils Relative: 2 %
HCT: 39.7 % (ref 36.0–46.0)
Hemoglobin: 12.2 g/dL (ref 12.0–15.0)
Immature Granulocytes: 1 %
Lymphocytes Relative: 11 %
Lymphs Abs: 1 10*3/uL (ref 0.7–4.0)
MCH: 29.3 pg (ref 26.0–34.0)
MCHC: 30.7 g/dL (ref 30.0–36.0)
MCV: 95.2 fL (ref 80.0–100.0)
Monocytes Absolute: 0.9 10*3/uL (ref 0.1–1.0)
Monocytes Relative: 10 %
Neutro Abs: 6.6 10*3/uL (ref 1.7–7.7)
Neutrophils Relative %: 75 %
Platelets: 215 10*3/uL (ref 150–400)
RBC: 4.17 MIL/uL (ref 3.87–5.11)
RDW: 14.1 % (ref 11.5–15.5)
WBC: 8.7 10*3/uL (ref 4.0–10.5)
nRBC: 0 % (ref 0.0–0.2)

## 2023-04-05 LAB — COMPREHENSIVE METABOLIC PANEL
ALT: 17 U/L (ref 0–44)
AST: 18 U/L (ref 15–41)
Albumin: 3.7 g/dL (ref 3.5–5.0)
Alkaline Phosphatase: 75 U/L (ref 38–126)
Anion gap: 8 (ref 5–15)
BUN: 14 mg/dL (ref 8–23)
CO2: 23 mmol/L (ref 22–32)
Calcium: 9.4 mg/dL (ref 8.9–10.3)
Chloride: 102 mmol/L (ref 98–111)
Creatinine, Ser: 0.86 mg/dL (ref 0.44–1.00)
GFR, Estimated: >60 mL/min (ref >60)
Glucose, Bld: 130 mg/dL — ABNORMAL HIGH (ref 70–99)
Potassium: 3.7 mmol/L (ref 3.5–5.1)
Sodium: 133 mmol/L — ABNORMAL LOW (ref 135–145)
Total Bilirubin: 1.1 mg/dL (ref <1.2)
Total Protein: 6.4 g/dL — ABNORMAL LOW (ref 6.5–8.1)

## 2023-04-05 LAB — URINALYSIS, ROUTINE W REFLEX MICROSCOPIC
Bacteria, UA: NONE SEEN
Bilirubin Urine: NEGATIVE
Glucose, UA: NEGATIVE mg/dL
Hgb urine dipstick: NEGATIVE
Ketones, ur: NEGATIVE mg/dL
Leukocytes,Ua: NEGATIVE
Nitrite: NEGATIVE
Protein, ur: 30 mg/dL — AB
Specific Gravity, Urine: 1.031 — ABNORMAL HIGH (ref 1.005–1.030)
pH: 7 (ref 5.0–8.0)

## 2023-04-05 LAB — LIPASE, BLOOD: Lipase: 42 U/L (ref 11–51)

## 2023-04-05 MED ORDER — SODIUM CHLORIDE 0.9 % IV BOLUS
1000.0000 mL | Freq: Once | INTRAVENOUS | Status: AC
  Administered 2023-04-05: 1000 mL via INTRAVENOUS

## 2023-04-05 MED ORDER — IOHEXOL 300 MG/ML  SOLN
100.0000 mL | Freq: Once | INTRAMUSCULAR | Status: AC | PRN
  Administered 2023-04-05: 100 mL via INTRAVENOUS

## 2023-04-05 MED ORDER — MORPHINE SULFATE (PF) 4 MG/ML IV SOLN
4.0000 mg | Freq: Once | INTRAVENOUS | Status: AC
  Administered 2023-04-05: 4 mg via INTRAVENOUS
  Filled 2023-04-05: qty 1

## 2023-04-05 MED ORDER — ONDANSETRON HCL 4 MG/2ML IJ SOLN
4.0000 mg | Freq: Once | INTRAMUSCULAR | Status: AC
  Administered 2023-04-05: 4 mg via INTRAVENOUS
  Filled 2023-04-05: qty 2

## 2023-04-05 NOTE — Discharge Instructions (Signed)
Please the x-rays of your abdomen and the blood work and a urine sample were all very normal.  On your lung x-ray it does appear that you have some inflammation and this is something that does need to be followed up with your family doctor, they may need to send you to a pulmonary doctor.  Please call them first thing tomorrow morning to make this appointment, ER for worsening symptoms

## 2023-04-05 NOTE — ED Triage Notes (Signed)
Pt with c/o abdominal pain, lower leg pain, and a "tune that plays over and over in her head". Last BM yesterday.

## 2023-04-05 NOTE — ED Provider Notes (Signed)
At change of shift I took over the care of this patient.  She has unremarkable vital signs with mild hypertension but no other acute findings.  She has a CT scan and labs which are unremarkable and a urinalysis without signs of infection.  I have added a chest x-ray because of a transient amount of hypoxia, she is not short of breath, her lung sounds are unremarkable, there is possibly some interstitial lung disease, she does not appear fluid overloaded at all.  The patient stable for discharge, she was informed of the findings, her home health aide is at the bedside as well, they expressed her understanding, she has a stable abdomen and is stable for discharge   Eber Hong, MD 04/05/23 1020

## 2023-04-05 NOTE — ED Provider Notes (Signed)
[place] EMERGENCY DEPARTMENT AT [facility] Provider Note   CSN: [id number] Arrival date & time: 04/05/23  4098     History  Chief Complaint  Patient presents with   Abdominal Pain   Leg Pain    Karina [name] is a 89 y.o. female.  The history is provided by the patient.  Abdominal Pain Leg Pain She developed abdominal pain over the last 12-24 hours.  Pain is mainly in the mid abdomen.  There is associated nausea but no vomiting.  Last bowel movement was yesterday.  She also complains of a very dry mouth.  She has diabetic neuropathy in her legs, no actual new pain.   Home Medications Prior to Admission medications   Medication Sig Start Date End Date Taking? Authorizing Provider  apixaban (ELIQUIS) 2.5 MG TABS tablet Take 1 tablet (2.5 mg total) 2 (two) times daily by mouth. 02/23/17   Renne Musca, MD  benzonatate (TESSALON) 200 MG capsule Take 200 mg by mouth daily as needed. Patient not taking: Reported on 06/26/2022 12/04/21   [provider]  cloNIDine (CATAPRES) 0.1 MG tablet Take 0.1 mg by mouth 3 (three) times daily. 05/07/09   [provider]  cycloSPORINE (RESTASIS) 0.05 % ophthalmic emulsion Place 1 drop into both eyes 2 (two) times daily.    [provider]  diltiazem (TIAZAC) 120 MG 24 hr capsule Take 120 mg by mouth daily. 12/12/21   [provider]  feeding supplement (ENSURE ENLIVE / ENSURE PLUS) LIQD Take 237 mLs by mouth 2 (two) times daily between meals. 11/05/21   Catarina Hartshorn, MD  furosemide (LASIX) 20 MG tablet TAKE 1 TABLET BY MOUTH DAILY. MAY TAKE 1 extra TABLET AS NEEDED FOR SWELLING OR SHORTNESS OF BREATH 11/03/22   Sharlene Dory, NP  Lifitegrast [place] Spice) 5 % SOLN Apply to eye in the morning and at bedtime.    [provider]  metoprolol succinate (TOPROL-XL) 25 MG 24 hr tablet Take 25 mg by mouth 2 (two) times daily. 04/17/21   [provider]  mirtazapine (REMERON SOL-TAB) 15 MG  disintegrating tablet Take 15 mg by mouth every morning.    [provider]  nitroGLYCERIN (NITROSTAT) 0.4 MG SL tablet     [provider]  pantoprazole (PROTONIX) 40 MG tablet Take 40 mg by mouth every morning. Patient not taking: Reported on 06/10/2022    [provider]  QUEtiapine (SEROQUEL) 25 MG tablet Take 1 tablet (25 mg total) by mouth at bedtime. Patient not taking: Reported on 10/10/2022 02/10/22   Marinda Elk, MD  rosuvastatin (CRESTOR) 5 MG tablet Take 1 tablet (5 mg total) by mouth daily. Patient not taking: Reported on 10/10/2022 12/21/18   Shon Hale, MD  valsartan (DIOVAN) 80 MG tablet Take 80 mg by mouth daily.    [provider]      Allergies    Ace inhibitors, Amiodarone, Tikosyn [dofetilide], and Meperidine and related    Review of Systems   Review of Systems  Gastrointestinal:  Positive for abdominal pain.  All other systems reviewed and are negative.   Physical Exam Updated Vital Signs BP (!) 163/95   Pulse 78   Temp 97.8 F (36.6 C) (Oral)   Resp 20   Ht 5' 1.5" (1.562 m)   Wt 54 kg   SpO2 97%   BMI 22.13 kg/m  Physical Exam Vitals and nursing note reviewed.   88 year old female, resting comfortably and in no acute distress.  Vital signs are significant for elevated blood pressure. Oxygen saturation is 97%, which is normal. Head is normocephalic and atraumatic. PERRLA, EOMI. Oropharynx is clear. Neck is nontender and supple without adenopathy. Lungs are clear without rales, wheezes, or rhonchi. Chest is nontender. Heart has regular rate and rhythm without murmur. Abdomen is soft, flat, with mild to moderate periumbilical tenderness.  There is no rebound or guarding. Extremities have no cyanosis or edema. Skin is warm and dry without rash. Neurologic: Mental status is normal, .  ED Results / Procedures / Treatments   Labs (all labs ordered are listed, but only abnormal results are displayed) Labs  Reviewed  COMPREHENSIVE METABOLIC PANEL  LIPASE, BLOOD  CBC WITH DIFFERENTIAL/PLATELET  URINALYSIS, ROUTINE W REFLEX MICROSCOPIC    EKG None  Radiology No results found.  Procedures Procedures    Medications Ordered in ED Medications  sodium chloride 0.9 % bolus 1,000 mL (has no administration in time range)  morphine (PF) 4 MG/ML injection 4 mg (has no administration in time range)  ondansetron (ZOFRAN) injection 4 mg (has no administration in time range)    ED Course/ Medical Decision Making/ A&P                                 Medical Decision Making Amount and/or Complexity of Data Reviewed Labs: ordered. Radiology: ordered.  Risk Prescription drug management.   Periumbilical pain, etiology unclear.  Possible umbilical hernia, diverticulitis, sinusitis, pancreatitis.  I have reviewed her past records, and she has history of abdominal aortic aneurysm repair.  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  I have ordered IV fluids, morphine for pain, ondansetron for nausea and I have ordered a CT of abdomen and pelvis.  Case is signed out to Dr. Hyacinth Meeker, oncoming physician.  Final Clinical Impression(s) / ED Diagnoses Final diagnoses:  Periumbilical abdominal pain    Rx / DC Orders ED Discharge Orders     None         Dione Booze, MD 04/05/23 [phone]

## 2023-04-05 NOTE — ED Notes (Signed)
Pt o2 sat dropped 87-88 % on RA, places pt on 2 L [place] with sat coming up to 93-94 %

## 2023-04-07 DIAGNOSIS — R109 Unspecified abdominal pain: Secondary | ICD-10-CM | POA: Diagnosis not present

## 2023-04-07 DIAGNOSIS — R52 Pain, unspecified: Secondary | ICD-10-CM | POA: Diagnosis not present

## 2023-04-07 DIAGNOSIS — K5900 Constipation, unspecified: Secondary | ICD-10-CM | POA: Diagnosis not present

## 2023-04-07 DIAGNOSIS — Z299 Encounter for prophylactic measures, unspecified: Secondary | ICD-10-CM | POA: Diagnosis not present

## 2023-04-07 DIAGNOSIS — I509 Heart failure, unspecified: Secondary | ICD-10-CM | POA: Diagnosis not present

## 2023-04-07 DIAGNOSIS — I10 Essential (primary) hypertension: Secondary | ICD-10-CM | POA: Diagnosis not present

## 2023-04-21 ENCOUNTER — Encounter: Payer: Self-pay | Admitting: Gastroenterology

## 2023-04-21 ENCOUNTER — Ambulatory Visit (INDEPENDENT_AMBULATORY_CARE_PROVIDER_SITE_OTHER): Payer: Medicare Other | Admitting: Gastroenterology

## 2023-04-21 VITALS — BP 148/97 | HR 83 | Temp 97.5°F | Ht 61.5 in | Wt 118.4 lb

## 2023-04-21 DIAGNOSIS — K869 Disease of pancreas, unspecified: Secondary | ICD-10-CM | POA: Diagnosis not present

## 2023-04-21 DIAGNOSIS — R1033 Periumbilical pain: Secondary | ICD-10-CM

## 2023-04-21 DIAGNOSIS — K769 Liver disease, unspecified: Secondary | ICD-10-CM | POA: Insufficient documentation

## 2023-04-21 DIAGNOSIS — K5900 Constipation, unspecified: Secondary | ICD-10-CM | POA: Diagnosis not present

## 2023-04-21 DIAGNOSIS — R109 Unspecified abdominal pain: Secondary | ICD-10-CM

## 2023-04-21 MED ORDER — POLYETHYLENE GLYCOL 3350 17 GM/SCOOP PO POWD: ORAL | 3 refills | Status: DC

## 2023-04-21 NOTE — Progress Notes (Signed)
 GI Office Note    Referring Provider: Maree Isles, MD Primary Care Physician:  Maree Isles, MD  Primary Gastroenterologist: Ozell Hollingshead, MD   Chief Complaint   Chief Complaint  Patient presents with   Constipation    History of Present Illness   Karina [name] is a 89 y.o. female presenting today at the request of Dr. Maree for constipation.  Recent ED visit for abdominal pain. Labs as outlined below. CT as follows.   CT A/P with contrast 03/2023: IMPRESSION: 1. No definite acute findings are noted in the abdomen or pelvis to account for the patient's symptoms. Specifically, no evidence of bowel obstruction. 2. Infrarenal abdominal aortic aneurysm, similar to the prior study in this patient with history of aorto bi-iliac stent graft. The stent graft appears patent. 3. New subcentimeter low-attenuation lesion in the right lobe of the liver, too small to characterize. Although this could simply represent a small cyst, this was not readily apparent on prior examinations. The possibility of a developing neoplasm or metastatic lesion should be considered. However, definitive characterization would require further evaluation with follow-up nonemergent abdominal MRI with and without IV gadolinium, which may not be appropriate given the patient's advanced age) and indwelling pacemaker). Attention on any future follow-up imaging is suggested. 4. Colonic diverticulosis without evidence of acute diverticulitis at this time. 5. 1 cm low-attenuation lesion in the body of the pancreas. This is stable compared to prior studies. No imaging follow-up is recommended given the patient's advanced age. 6. Additional incidental findings, as above, similar to prior studies.   Today: patient presents with caregiver. Reported poor appetite which is chronic. Eats well at breakfast but otherwise they have to beg her to eat. Slowly losing weight. She has chronic constipation for  several years. Symptoms worse more recently after going out of state for a week to visit her son for Christmas. No N/V. She has tried several OTC laxatives but switches around and never stays the course. Has used miralax , MOM, metamucil, docusate sodium , fleets glycerin suppositories. Went to ED recently for severe abdominal pain/back pain. Frustrated that nothing found to explain her symptoms. She has some more abdominal pain last night. Thinks she is constipated. Does not have postprandial abdominal pain. Tried MOM, whole capful, up all night passing small stools. Feels like she shouldn't have to take anything to move her bowels. Notes she is less active over the past one year after hip fracture. Appetite less as well. Occasional heartburn. Eats fruit for breakfast. Drinks 3-4 glasses of water daily.   Colonoscopy 2011.    Wt Readings from Last 10 Encounters:  04/21/23 118 lb 6.4 oz (53.7 kg)  04/05/23 119 lb 0.8 oz (54 kg)  10/10/22 119 lb 6.4 oz (54.2 kg)  09/18/22 118 lb (53.5 kg)  06/26/22 120 lb (54.4 kg)  06/19/22 123 lb 7.3 oz (56 kg)  06/10/22 124 lb 4 oz (56.4 kg)  04/29/22 118 lb (53.5 kg)  02/11/22 115 lb 1.3 oz (52.2 kg)  01/11/22 115 lb 1.3 oz (52.2 kg)     Medications   Current Outpatient Medications  Medication Sig Dispense Refill   apixaban  (ELIQUIS ) 2.5 MG TABS tablet Take 1 tablet (2.5 mg total) 2 (two) times daily by mouth. 60 tablet 1   cloNIDine  (CATAPRES ) 0.1 MG tablet Take 0.1 mg by mouth 3 (three) times daily.     cycloSPORINE (RESTASIS) 0.05 % ophthalmic emulsion Place 1 drop into both eyes 2 (two) times daily.  diltiazem  (TIAZAC ) 120 MG 24 hr capsule Take 120 mg by mouth daily.     feeding supplement (ENSURE ENLIVE / ENSURE PLUS) LIQD Take 237 mLs by mouth 2 (two) times daily between meals. 237 mL 12   fluorometholone (FML) 0.1 % ophthalmic suspension 1 drop 2 (two) times daily.     furosemide  (LASIX ) 20 MG tablet TAKE 1 TABLET BY MOUTH DAILY. MAY TAKE 1  extra TABLET AS NEEDED FOR SWELLING OR SHORTNESS OF BREATH 30 tablet 2   Lifitegrast (XIIDRA) 5 % SOLN Apply to eye in the morning and at bedtime.     metoprolol  succinate (TOPROL -XL) 25 MG 24 hr tablet Take 25 mg by mouth 2 (two) times daily.     mirtazapine  (REMERON  SOL-TAB) 15 MG disintegrating tablet Take 15 mg by mouth every morning.     nitroGLYCERIN  (NITROSTAT ) 0.4 MG SL tablet      pantoprazole  (PROTONIX ) 40 MG tablet Take 40 mg by mouth every morning.     rosuvastatin  (CRESTOR ) 5 MG tablet Take 1 tablet (5 mg total) by mouth daily. 30 tablet 3   valsartan  (DIOVAN ) 80 MG tablet Take 80 mg by mouth daily.     No current facility-administered medications for this visit.    Allergies   Allergies as of 04/21/2023 - Review Complete 04/21/2023  Allergen Reaction Noted   Ace inhibitors Hives 07/14/2013   Amiodarone Other (See Comments) 07/14/2013   Tikosyn [dofetilide] Other (See Comments) 09/15/2013   Meperidine  and related Other (See Comments) 07/14/2013    Past Medical History   Past Medical History:  Diagnosis Date   Actinic keratosis    Bilateral leg weakness 09/27/12   CAD (coronary artery disease)    EF 55% by 2 D Echo 2008 and 2012   Chronic atrial fibrillation (HCC)    Chronic edema 10/15/11   Chronic fatigue    DDD (degenerative disc disease)    Encounter for long-term (current) use of other medications 09/02/12   Endometrial carcinoma (HCC)    Fibromuscular dysplasia (HCC)    S/P PCI right renal aretery 1999   GERD (gastroesophageal reflux disease)    Hip fracture, right (HCC) 08/06/06   S/P surgery   Hx of cardiovascular stress test 09/2010   Nuclear stress testing showing no evidence of ischemia, EF=76%, No EKG changes & no perfusion defects.   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   LVH (left ventricular  hypertrophy)    Mild   Osteoarthritis    Osteoporosis    With Hx of thoracic compression fractures   Pacemaker    Paroxysmal atrial fibrillation (HCC)    Peripheral neuropathy    Peripheral vascular disease (HCC)    Pre-diabetes 01/21/11   Chronic   Renovascular hypertension    Tachy-brady syndrome (HCC)    Thoracic compression fracture (HCC)    TIA (transient ischemic attack) 08/2011   OSH: flushing, left LE numbness, CT negative   Tic douloureux 1994   S/P successful surgery   Trigeminal neuralgia of right side of face     Past Surgical History   Past Surgical History:  Procedure Laterality Date   ABDOMINAL AORTAGRAM  09/05/01   Right & left renals 25%   APPENDECTOMY  1946   CARDIAC CATHETERIZATION  11/05/01   EF 72%. RCA 25%. LCX 50%.  Ramus 75/100%. LAD 95%. D2 75%.   CESAREAN SECTION  1959   CESAREAN SECTION  1961   CHOLECYSTECTOMY  1989   CORONARY ANGIOPLASTY     CORONARY ANGIOPLASTY WITH STENT PLACEMENT  11/05/01   PTCA/Stent to 95% mid LAD, Ramus lesion could not be crossed and was not treated.    DIPYRIDAMOLE STRESS TEST  09/2010   Nuclear stress testing showing no evidence of ischemia, EF=76%, No EKG changes & no perfusion defects.   LUMBAR DISC SURGERY     ORIF FEMUR FRACTURE Right 02/16/2017   Procedure: OPEN REDUCTION INTERNAL FIXATION (ORIF) DISTAL FEMUR FRACTURE;  Surgeon: Kendal Franky SQUIBB, MD;  Location: [facility] OR;  Service: Orthopedics;  Laterality: Right;   RENAL ANGIOGRAM  09/14/97   25% diffuse left renal artery. 50% ostia; right renal artery.   RENAL ARTERY ANGIOPLASTY Right 1994   Renal artery stenosis secondary to fibromusclar dysplasia   RENAL ARTERY ANGIOPLASTY Right 09/13/97   PTA/Stent to ostial right renal artery, No distal fibromuscular hyperplasia   SPINAL FUSION  2002   Percutaneous spinal fusion   TOTAL ABDOMINAL HYSTERECTOMY W/ BILATERAL SALPINGOOPHORECTOMY  1990   VAGINAL HYSTERECTOMY  1990    Past Family History   Family History  Problem  Relation Age of Onset   Stroke Father    COPD Brother    Diabetes Brother        DM   Hypertension Brother    Stroke Other     Past Social History   Social History   Socioeconomic History   Marital status: Widowed    Spouse name: Not on file   Number of children: Not on file   Years of education: Not on file   Highest education level: Not on file  Occupational History   Not on file  Tobacco Use   Smoking status: Never    Passive exposure: Never   Smokeless tobacco: Never  Vaping Use   Vaping status: Never Used  Substance and Sexual Activity   Alcohol  use: Yes    Alcohol /week: 0.0 standard drinks of alcohol     Comment: Rarely   Drug use: No   Sexual activity: Not Currently  Other Topics Concern   Not on file  Social History Narrative   Not on file   Social Drivers of Health   Financial Resource Strain: Not on file  Food Insecurity: No Food Insecurity (02/06/2022)   Hunger Vital Sign    Worried About Running Out of Food in the Last Year: Never true    Ran Out of Food in the Last Year: Never true  Transportation Needs: No Transportation Needs (02/06/2022)   PRAPARE - Administrator, Civil Service (Medical): No    Lack of Transportation (Non-Medical): No  Physical Activity: Not on file  Stress: Not on file  Social Connections: Unknown (09/18/2021)   Received from [facility]   Social Network    Social Network: Not on file  Intimate Partner Violence: Not At Risk (02/06/2022)   Humiliation, Afraid, Rape, and Kick questionnaire    Fear of Current or Ex-Partner: No    Emotionally Abused: No    Physically Abused: No    Sexually Abused: No    Review of Systems   General: Negative for fever, chills, fatigue, weakness. See hpi Eyes: Negative for vision changes.  ENT: Negative for hoarseness, difficulty swallowing , nasal congestion. CV: Negative for chest pain, angina, palpitations, dyspnea on exertion, peripheral edema.  Respiratory:  Negative  for dyspnea at rest, dyspnea on exertion, cough, sputum, wheezing.  GI: See history of present illness. GU:  Negative for dysuria, hematuria, urinary incontinence, urinary frequency, nocturnal urination.  MS: Negative for joint pain, low back pain.  Derm: Negative for rash or itching.  Neuro: Negative for weakness, abnormal sensation, seizure, frequent headaches, memory loss,  confusion.  Psych: Negative for anxiety, depression, suicidal ideation, hallucinations.  Endo: Negative for unusual weight change.  Heme: Negative for bruising or bleeding. Allergy: Negative for rash or hives.  Physical Exam   BP (!) 148/97 (BP Location: Right Arm, Patient Position: Sitting, Cuff Size: Normal)   Pulse 83   Temp (!) 97.5 F (36.4 C) (Oral)   Ht 5' 1.5 (1.562 m)   Wt 118 lb 6.4 oz (53.7 kg)   SpO2 97%   BMI 22.01 kg/m    General: Well-nourished, well-developed in no acute distress.  Head: Normocephalic, atraumatic.   Eyes: Conjunctiva pink, no icterus. Mouth: Oropharyngeal mucosa moist and pink   Neck: Supple without thyromegaly, masses, or lymphadenopathy.  Lungs: Clear to auscultation bilaterally.  Heart: Regular rate and rhythm, no murmurs rubs or gallops.  Abdomen: Bowel sounds are normal, nontender, nondistended, no hepatosplenomegaly or masses,  no abdominal bruits or hernia, no rebound or guarding.   Rectal: not performed Extremities: No lower extremity edema. No clubbing or deformities.  Neuro: Alert and oriented x 4 , grossly normal neurologically.  Skin: Warm and dry, no rash or jaundice.   Psych: Alert and cooperative, normal mood and affect.  Labs   Lab Results  Component Value Date   WBC 8.7 04/05/2023   HGB 12.2 04/05/2023   HCT 39.7 04/05/2023   MCV 95.2 04/05/2023   PLT 215 04/05/2023   Lab Results  Component Value Date   NA 133 (L) 04/05/2023   CL 102 04/05/2023   K 3.7 04/05/2023   CO2 23 04/05/2023   BUN 14 04/05/2023   CREATININE 0.86  04/05/2023   GFRNONAA >60 04/05/2023   CALCIUM  9.4 04/05/2023   ALBUMIN  3.7 04/05/2023   GLUCOSE 130 (H) 04/05/2023   Lab Results  Component Value Date   ALT 17 04/05/2023   AST 18 04/05/2023   ALKPHOS 75 04/05/2023   BILITOT 1.1 04/05/2023   Lab Results  Component Value Date   LIPASE 42 04/05/2023   12/2022: TSH  0.869  Imaging Studies   DG Chest Port 1 View Result Date: 04/05/2023 CLINICAL DATA:  Shortness of breath. EXAM: PORTABLE CHEST 1 VIEW COMPARISON:  06/19/2022. FINDINGS: There are diffuse increased interstitial markings throughout bilateral lungs, which are nonspecific. There is blunting of left lateral costophrenic angle, which may represent small left pleural effusion. Right lateral costophrenic angle is clear. No pneumothorax on either side. Stable cardio-mediastinal silhouette. Note is made of left-sided ICD. No acute osseous abnormalities. Partially seen lumbar spinal fixation hardware an aortic stent. The soft tissues are within normal limits. IMPRESSION: *Diffuse increased interstitial markings throughout bilateral lungs. Differential diagnosis includes pulmonary edema, underlying interstitial lung disease, etc. Correlate clinically. *Small left pleural effusion. Electronically Signed   By: Ree Molt M.D.   On: 04/05/2023 09:25   CT ABDOMEN PELVIS W CONTRAST Result Date: 04/05/2023 CLINICAL DATA:  89 year old female with history of abdominal pain, most severe in the mid abdomen with some associated nausea. Evaluate for potential bowel obstruction. EXAM: CT ABDOMEN AND PELVIS WITH CONTRAST TECHNIQUE: Multidetector CT imaging of the abdomen and pelvis was performed using the standard protocol following bolus administration of  intravenous contrast. RADIATION DOSE REDUCTION: This exam was performed according to the departmental dose-optimization program which includes automated exposure control, adjustment of the mA and/or kV according to patient size and/or use of  iterative reconstruction technique. CONTRAST:  OMNIPAQUE  IOHEXOL  300 MG/ML  SOLN COMPARISON:  CT of the abdomen and pelvis 11/02/2021. FINDINGS: Lower chest: Areas of scarring in the lung bases bilaterally. Cardiomegaly. Pacemaker lead terminating in the right ventricular apex. Atherosclerotic calcifications are noted in the left anterior descending, left circumflex and right coronary arteries. Hepatobiliary: Subcentimeter low-attenuation lesion in the right lobe of the liver, 2 small to characterize on today's examination, but new compared to the prior study. No other larger more suspicious appearing hepatic lesions are noted. No intra or extrahepatic biliary ductal dilatation. Status post cholecystectomy. Pancreas: 1 cm low-attenuation lesion in the body of the pancreas (axial image 29 of series 2), similar to the prior study. No other solid appearing pancreatic neoplasm. No pancreatic ductal dilatation. No peripancreatic fluid collections or inflammatory changes. Spleen: Unremarkable. Adrenals/Urinary Tract: Moderate atrophy of the kidneys bilaterally (left-greater-than-right). Multiple subcentimeter low-attenuation lesions in both kidneys, too small to definitively characterize, but statistically likely to represent cysts (no imaging follow-up recommended). Exophytic 2.7 cm low-attenuation lesion in the lower pole of the left kidney is compatible with a simple cyst (Bosniak class 1), which requires no imaging follow-up. No hydroureteronephrosis. Urinary bladder is unremarkable in appearance. Bilateral adrenal glands are normal in appearance. Stomach/Bowel: The appearance of the stomach is normal. No pathologic dilatation of small bowel or colon. The appendix is not confidently identified and may be surgically absent. Regardless, there are no inflammatory changes noted adjacent to the cecum to suggest the presence of an acute appendicitis at this time. Vascular/Lymphatic: Large infrarenal abdominal aortic  aneurysm measuring 5.9 x 6.8 cm, similar to the prior study. Patient is status post aortoiliac bypass graft, with both limbs of the graft patent. Aneurysmal dilatation of the left common iliac artery distally measuring up to 2 cm in diameter. No lymphadenopathy noted in the abdomen or pelvis. Reproductive: Status post hysterectomy. Ovaries are not confidently identified may be surgically absent or atrophic. Other: No significant volume of ascites.  No pneumoperitoneum. Musculoskeletal: Status post right hip arthroplasty. Orthopedic rod and screw fixation hardware in the lumbar spine. Chronic compression fracture of T11 with 40% loss of anterior vertebral body height. There are no aggressive appearing lytic or blastic lesions noted in the visualized portions of the skeleton. IMPRESSION: 1. No definite acute findings are noted in the abdomen or pelvis to account for the patient's symptoms. Specifically, no evidence of bowel obstruction. 2. Infrarenal abdominal aortic aneurysm, similar to the prior study in this patient with history of aorto bi-iliac stent graft. The stent graft appears patent. 3. New subcentimeter low-attenuation lesion in the right lobe of the liver, too small to characterize. Although this could simply represent a small cyst, this was not readily apparent on prior examinations. The possibility of a developing neoplasm or metastatic lesion should be considered. However, definitive characterization would require further evaluation with follow-up nonemergent abdominal MRI with and without IV gadolinium, which may not be appropriate given the patient's advanced age) and indwelling pacemaker). Attention on any future follow-up imaging is suggested. 4. Colonic diverticulosis without evidence of acute diverticulitis at this time. 5. 1 cm low-attenuation lesion in the body of the pancreas. This is stable compared to prior studies. No imaging follow-up is recommended given the patient's advanced age. 6.  Additional incidental findings, as  above, similar to prior studies. Electronically Signed   By: Toribio Aye M.D.   On: 04/05/2023 08:41    Assessment/plan:    Constipation/abdominal pain: -CT without obstruction or obvious source for her abdominal pain, pain overall improved since ED presentation -will treat her constipation and monitor her for recurrent/persistent abdominal pain -start miralx one capful TID until soft stool, then continue once daily. Explained at length that she will need to continue regularly to see results -call in 3-4 weeks with progress report or sooner if no improvement of symptoms.   Pancreatic lesion: stable since 04/2021, was also noted on imaging at OSH in 2022. Discussed with patient, given her advanced age, would suggest no surveillance of this lesion, given she would unlikely be a surgical candidate given her multiple comorbidities  Liver lesion: subcentimeter lesion, possibly a cyst per radiologist but cannot exclude other etiology including malignancy. Given advanced age and multiple comorbidites, would consider no surveillance at this time, could be reassess at time of any future imaging. Likely a poor MRI candidate given her history of pacemaker and ability to complete MRI with ideal images. If future imaging planned, would consider CT.   Sonny RAMAN. Ezzard, MHS, PA-C [facility]

## 2023-04-21 NOTE — Patient Instructions (Signed)
 Please start miralax  (ok to use generic or store brand). You will take one capful mixed in 6 ounces of fluid three times daily until you have a soft stool, then continue once daily. It may take several days for you to see results.  Call in 3-4 weeks and let me know how you are doing. You can reach my CMA, Tammy at please [phone]. For more immediate assistance, call our general number at [phone].  If you are not having better bowel movements, you have worse abdominal pain, unable to eat, please call.

## 2023-04-25 DIAGNOSIS — M9701XD Periprosthetic fracture around internal prosthetic right hip joint, subsequent encounter: Secondary | ICD-10-CM | POA: Diagnosis not present

## 2023-04-25 DIAGNOSIS — M6281 Muscle weakness (generalized): Secondary | ICD-10-CM | POA: Diagnosis not present

## 2023-04-25 DIAGNOSIS — R2689 Other abnormalities of gait and mobility: Secondary | ICD-10-CM | POA: Diagnosis not present

## 2023-04-26 ENCOUNTER — Emergency Department (HOSPITAL_COMMUNITY)
Admission: EM | Admit: 2023-04-26 | Discharge: 2023-04-26 | Disposition: A | Discharge status: Home / Self Care | Payer: Medicare Other | Attending: Emergency Medicine | Admitting: Emergency Medicine

## 2023-04-26 ENCOUNTER — Emergency Department (HOSPITAL_COMMUNITY): Payer: Medicare Other

## 2023-04-26 DIAGNOSIS — R109 Unspecified abdominal pain: Secondary | ICD-10-CM | POA: Diagnosis not present

## 2023-04-26 DIAGNOSIS — E119 Type 2 diabetes mellitus without complications: Secondary | ICD-10-CM | POA: Diagnosis not present

## 2023-04-26 DIAGNOSIS — K449 Diaphragmatic hernia without obstruction or gangrene: Secondary | ICD-10-CM | POA: Diagnosis not present

## 2023-04-26 DIAGNOSIS — R1033 Periumbilical pain: Secondary | ICD-10-CM

## 2023-04-26 DIAGNOSIS — Z7901 Long term (current) use of anticoagulants: Secondary | ICD-10-CM | POA: Diagnosis not present

## 2023-04-26 DIAGNOSIS — K862 Cyst of pancreas: Secondary | ICD-10-CM | POA: Diagnosis not present

## 2023-04-26 DIAGNOSIS — I7143 Infrarenal abdominal aortic aneurysm, without rupture: Secondary | ICD-10-CM | POA: Diagnosis not present

## 2023-04-26 DIAGNOSIS — K5900 Constipation, unspecified: Secondary | ICD-10-CM | POA: Insufficient documentation

## 2023-04-26 DIAGNOSIS — E860 Dehydration: Secondary | ICD-10-CM | POA: Diagnosis not present

## 2023-04-26 DIAGNOSIS — K769 Liver disease, unspecified: Secondary | ICD-10-CM | POA: Diagnosis not present

## 2023-04-26 DIAGNOSIS — R6889 Other general symptoms and signs: Secondary | ICD-10-CM | POA: Diagnosis not present

## 2023-04-26 DIAGNOSIS — Z743 Need for continuous supervision: Secondary | ICD-10-CM | POA: Diagnosis not present

## 2023-04-26 LAB — URINALYSIS, ROUTINE W REFLEX MICROSCOPIC
Bilirubin Urine: NEGATIVE
Glucose, UA: NEGATIVE mg/dL
Hgb urine dipstick: NEGATIVE
Ketones, ur: NEGATIVE mg/dL
Leukocytes,Ua: NEGATIVE
Nitrite: NEGATIVE
Protein, ur: NEGATIVE mg/dL
Specific Gravity, Urine: 1.005 (ref 1.005–1.030)
pH: 7 (ref 5.0–8.0)

## 2023-04-26 LAB — CBC WITH DIFFERENTIAL/PLATELET
Abs Immature Granulocytes: 0.05 10*3/uL (ref 0.00–0.07)
Basophils Absolute: 0.1 10*3/uL (ref 0.0–0.1)
Basophils Relative: 1 %
Eosinophils Absolute: 0.2 10*3/uL (ref 0.0–0.5)
Eosinophils Relative: 2 %
HCT: 38.7 % (ref 36.0–46.0)
Hemoglobin: 12.9 g/dL (ref 12.0–15.0)
Immature Granulocytes: 1 %
Lymphocytes Relative: 15 %
Lymphs Abs: 1.4 10*3/uL (ref 0.7–4.0)
MCH: 30.1 pg (ref 26.0–34.0)
MCHC: 33.3 g/dL (ref 30.0–36.0)
MCV: 90.4 fL (ref 80.0–100.0)
Monocytes Absolute: 1 10*3/uL (ref 0.1–1.0)
Monocytes Relative: 11 %
Neutro Abs: 6.5 10*3/uL (ref 1.7–7.7)
Neutrophils Relative %: 70 %
Platelets: 277 10*3/uL (ref 150–400)
RBC: 4.28 MIL/uL (ref 3.87–5.11)
RDW: 13.3 % (ref 11.5–15.5)
WBC: 9.2 10*3/uL (ref 4.0–10.5)
nRBC: 0 % (ref 0.0–0.2)

## 2023-04-26 LAB — COMPREHENSIVE METABOLIC PANEL
ALT: 15 U/L (ref 0–44)
AST: 18 U/L (ref 15–41)
Albumin: 3.6 g/dL (ref 3.5–5.0)
Alkaline Phosphatase: 80 U/L (ref 38–126)
Anion gap: 10 (ref 5–15)
BUN: 13 mg/dL (ref 8–23)
CO2: 24 mmol/L (ref 22–32)
Calcium: 9.4 mg/dL (ref 8.9–10.3)
Chloride: 97 mmol/L — ABNORMAL LOW (ref 98–111)
Creatinine, Ser: 0.91 mg/dL (ref 0.44–1.00)
GFR, Estimated: 57 mL/min — ABNORMAL LOW (ref >60)
Glucose, Bld: 97 mg/dL (ref 70–99)
Potassium: 4 mmol/L (ref 3.5–5.1)
Sodium: 131 mmol/L — ABNORMAL LOW (ref 135–145)
Total Bilirubin: 1 mg/dL (ref 0.0–1.2)
Total Protein: 6.4 g/dL — ABNORMAL LOW (ref 6.5–8.1)

## 2023-04-26 LAB — LIPASE, BLOOD: Lipase: 46 U/L (ref 11–51)

## 2023-04-26 MED ORDER — DICYCLOMINE HCL 10 MG PO CAPS
10.0000 mg | ORAL_CAPSULE | Freq: Once | ORAL | Status: AC
  Administered 2023-04-26: 10 mg via ORAL
  Filled 2023-04-26: qty 1

## 2023-04-26 MED ORDER — DICYCLOMINE HCL 20 MG PO TABS: 10.0000 mg | ORAL_TABLET | Freq: Two times a day (BID) | ORAL | 0 refills | Status: DC | PRN

## 2023-04-26 MED ORDER — IOHEXOL 300 MG/ML  SOLN
100.0000 mL | Freq: Once | INTRAMUSCULAR | Status: AC | PRN
  Administered 2023-04-26: 80 mL via INTRAVENOUS

## 2023-04-26 MED ORDER — OMEPRAZOLE 20 MG PO CPDR: 20.0000 mg | DELAYED_RELEASE_CAPSULE | Freq: Every day | ORAL | 0 refills | Status: DC

## 2023-04-26 NOTE — ED Triage Notes (Signed)
Pt arrives via [facility] from home for generalized abd pain. EMS reports that they were told pt has been c/o constipation x3 weeks and was seen by PCP and GI who gave her laxatives. Pt reports small BM's, most recently today. Endorses nausea without vomiting.

## 2023-04-26 NOTE — Discharge Instructions (Signed)
You were seen in the ER today for concerns of abdominal pain. Your labs and imaging thankfully all appear reassuring. I am unsure what exactly is causing your abdominal pain, but I suspect it may be due to reflux or abdominal spasms. I have sent two medications to your pharmacy to try to treat this. Please take this as prescribed. Return to the ER for new or worsening symptoms.

## 2023-04-26 NOTE — ED Provider Notes (Signed)
 [place] EMERGENCY DEPARTMENT AT [facility] Provider Note   CSN: [id number] Arrival date & time: 04/26/23  1422     History Chief Complaint  Patient presents with   Abdominal Pain    Karina [name] is a 89 y.o. female.  Patient past history significant for hyperlipidemia, atrial fibrillation, diabetes, and pancreatic lesions here with concerns of generalized abdominal pain.  She reports she has been experiencing some level of periumbilical abdominal pain for about 3 to 4 weeks.  Has had workup performed and no specific abnormalities have been noted to explain current symptoms.  Has seen GI and states that she was started on MiraLAX for attempted management of constipation.  Feels that she has some straining with bowel movements with last bowel movement was yesterday.  She denies any recent fever chills or bodyaches.   Abdominal Pain      Home Medications Prior to Admission medications   Medication Sig Start Date End Date Taking? Authorizing Provider  apixaban (ELIQUIS) 2.5 MG TABS tablet Take 1 tablet (2.5 mg total) 2 (two) times daily by mouth. 02/23/17  Yes Renne Musca, MD  cloNIDine (CATAPRES) 0.2 MG tablet Take 0.2 mg by mouth 3 (three) times daily.   Yes [provider]  dicyclomine (BENTYL) 20 MG tablet Take 0.5 tablets (10 mg total) by mouth 2 (two) times daily as needed for spasms. 04/26/23  Yes Smitty Knudsen, PA-C  diltiazem (TIAZAC) 120 MG 24 hr capsule Take 120 mg by mouth daily. 12/12/21  Yes [provider]  fluorometholone (FML) 0.1 % ophthalmic suspension Place 1 drop into both eyes 2 (two) times daily. 03/11/23  Yes [provider]  furosemide (LASIX) 20 MG tablet TAKE 1 TABLET BY MOUTH DAILY. MAY TAKE 1 extra TABLET AS NEEDED FOR SWELLING OR SHORTNESS OF BREATH Patient taking differently: Take 20 mg by mouth daily as needed for fluid or edema. 11/03/22  Yes Sharlene Dory, NP  metoprolol succinate (TOPROL-XL) 25 MG 24 hr  tablet Take 25 mg by mouth 2 (two) times daily. 04/17/21  Yes [provider]  mirtazapine (REMERON SOL-TAB) 15 MG disintegrating tablet Take 15 mg by mouth at bedtime.   Yes [provider]  omeprazole (PRILOSEC) 20 MG capsule Take 1 capsule (20 mg total) by mouth daily. 04/26/23  Yes Maryanna Shape A, PA-C  polyethylene glycol powder (GLYCOLAX/MIRALAX) 17 GM/SCOOP powder Take one capful mixed in 6 ounces of liquid three times daily until soft stool. Then continue one capful daily. 04/21/23  Yes Tiffany Kocher, PA-C  valsartan (DIOVAN) 80 MG tablet Take 80 mg by mouth daily.   Yes [provider]      Allergies    Ace inhibitors, Pacerone [amiodarone], Tikosyn [dofetilide], and Meperidine and related    Review of Systems   Review of Systems  Gastrointestinal:  Positive for abdominal pain.  All other systems reviewed and are negative.   Physical Exam Updated Vital Signs BP (!) 170/100   Pulse 79   Temp 97.7 F (36.5 C) (Oral)   Resp (!) 23   SpO2 93%  Physical Exam Vitals and nursing note reviewed.  Constitutional:      General: She is not in acute distress.    Appearance: She is well-developed.  HENT:     Head: Normocephalic and atraumatic.  Eyes:     Conjunctiva/sclera: Conjunctivae normal.  Cardiovascular:     Rate and Rhythm: Normal rate and regular rhythm.     Heart sounds: No  murmur heard. Pulmonary:     Effort: Pulmonary effort is normal. No respiratory distress.     Breath sounds: Normal breath sounds.  Abdominal:     General: Bowel sounds are normal.     Palpations: Abdomen is soft.     Tenderness: There is abdominal tenderness in the periumbilical area. There is guarding.  Musculoskeletal:        General: No swelling.     Cervical back: Neck supple.  Skin:    General: Skin is warm and dry.     Capillary Refill: Capillary refill takes less than 2 seconds.  Neurological:     Mental Status: She is alert.  Psychiatric:        Mood and  Affect: Mood normal.     ED Results / Procedures / Treatments   Labs (all labs ordered are listed, but only abnormal results are displayed) Labs Reviewed  COMPREHENSIVE METABOLIC PANEL - Abnormal; Notable for the following components:      Result Value   Sodium 131 (*)    Chloride 97 (*)    Total Protein 6.4 (*)    GFR, Estimated 57 (*)    All other components within normal limits  URINALYSIS, ROUTINE W REFLEX MICROSCOPIC - Abnormal; Notable for the following components:   APPearance HAZY (*)    All other components within normal limits  CBC WITH DIFFERENTIAL/PLATELET  LIPASE, BLOOD    EKG None  Radiology CT ABDOMEN PELVIS W CONTRAST Result Date: 04/26/2023 CLINICAL DATA:  Generalized abdominal pain EXAM: CT ABDOMEN AND PELVIS WITH CONTRAST TECHNIQUE: Multidetector CT imaging of the abdomen and pelvis was performed using the standard protocol following bolus administration of intravenous contrast. RADIATION DOSE REDUCTION: This exam was performed according to the departmental dose-optimization program which includes automated exposure control, adjustment of the mA and/or kV according to patient size and/or use of iterative reconstruction technique. CONTRAST:  80mL OMNIPAQUE IOHEXOL 300 MG/ML  SOLN COMPARISON:  04/05/2023 FINDINGS: Lower chest: Cardiomegaly. Left greater than right bibasilar atelectasis or consolidation. Hepatobiliary: Unchanged subcentimeter low-density lesion in the right hepatic lobe which remains too small to characterize. Liver is otherwise unremarkable. Prior cholecystectomy. Pancreas: Stable pancreatic cyst, no follow-up imaging recommended. No pancreatic inflammatory changes. Spleen: Normal in size without focal abnormality. Adrenals/Urinary Tract: Unremarkable adrenal glands. No renal stone or hydronephrosis. Urinary bladder within normal limits. Stomach/Bowel: Tiny hiatal hernia. Stomach otherwise within normal limits. No abnormally dilated loops of bowel. No  focal bowel wall thickening or inflammatory changes. Vascular/Lymphatic: Chronic infrarenal abdominal aortic aneurysm status post aortoiliac stent grafting. Excluded aneurysm sac measures 6.7 x 6.3 cm in transaxial diameter, not appreciably changed when remeasured on the prior study. No lymphadenopathy. Reproductive: Status post hysterectomy. No adnexal masses. Other: No free fluid. No abdominopelvic fluid collection. No pneumoperitoneum. No abdominal wall hernia. Musculoskeletal: No new or acute bony abnormalities. IMPRESSION: 1. No acute abdominopelvic findings. Specifically, no evidence of bowel obstruction. 2. Left greater than right bibasilar atelectasis or consolidation. 3. Chronic infrarenal abdominal aortic aneurysm status post aortoiliac stent grafting. 4. Aortic atherosclerosis (ICD10-I70.0). Electronically Signed   By: Duanne Guess D.O.   On: 04/26/2023 17:59    Procedures Procedures    Medications Ordered in ED Medications  dicyclomine (BENTYL) capsule 10 mg (10 mg Oral Given 04/26/23 1739)  iohexol (OMNIPAQUE) 300 MG/ML solution 100 mL (80 mLs Intravenous Contrast Given 04/26/23 1722)    ED Course/ Medical Decision Making/ A&P  Medical Decision Making Amount and/or Complexity of Data Reviewed Labs: ordered. Radiology: ordered.  Risk Prescription drug management.   This patient presents to the ED for concern of abdominal pain.  Differential diagnosis includes gastroenteritis, bowel obstruction, constipation, dehydration, malnutrition    Lab Tests:  I Ordered, and personally interpreted labs.  The pertinent results include: CBC is unremarkable, unremarkable, CMP with mild dehydration with slight decline in GFR 57/131, lipase unremarkable, UA without signs of infection   Imaging Studies ordered:  I ordered imaging studies including CT abdomen pelvis I independently visualized and interpreted imaging which showed no evidence of any acute  abdominal abnormality I agree with the radiologist interpretation   Medicines ordered and prescription drug management:  I ordered medication including Bentyl for abdominal pain Reevaluation of the patient after these medicines showed that the patient improved I have reviewed the patients home medicines and have made adjustments as needed   Problem List / ED Course:  Patient presents the emergency department concerns of abdominal pain.  History significant for hyperlipidemia, atrial fibrillation, diabetes and pancreatic lesions.  States this pain is been ongoing for about 3 to 4 weeks and concern about some worsening constipation although she is having regular bowel movements.  Currently takes MiraLAX which appears to be helping.  Recently had workup which revealed no acute abnormality at that time. Exam is large unremarkable but there is some guarding of the abdomen with gentle palpation.  Primarily in the periumbilical region. Labs and imaging are largely unremarkable.  Mild dehydration seen which I suspect is due to some of the nausea the patient has been endorsing due to the general abdominal discomfort.  No focal findings to suggest bowel obstruction, mass, or other acute concerning findings.  I suspect patient's symptoms may be due to some acid reflux or possible spasming.  Patient given a dose of Bentyl here in the ER with improvement in symptoms.  Will discharge patient home with continuation of Bentyl as well as omeprazole.  Patient's caretaker present in the room and verbalized understanding changes to medications.  No acute indication for admission to the hospital at this time and encourage patient to follow-up with primary care provider in the outpatient setting.  Patient discharged home in stable condition.  Final Clinical Impression(s) / ED Diagnoses Final diagnoses:  Periumbilical abdominal pain  Constipation, unspecified constipation type    Rx / DC Orders ED Discharge Orders           Ordered    dicyclomine (BENTYL) 20 MG tablet  2 times daily PRN        04/26/23 1839    omeprazole (PRILOSEC) 20 MG capsule  Daily        04/26/23 1839              Smitty Knudsen, PA-C 04/26/23 1843    Gloris Manchester, MD 04/27/23 2344

## 2023-04-28 ENCOUNTER — Telehealth: Payer: Self-pay | Admitting: *Deleted

## 2023-04-28 NOTE — Patient Outreach (Signed)
  Care Coordination   04/28/2023 Name: Karina [name] MRN: [id number] DOB: 1922-07-09   Care Coordination Outreach Attempts:  An unsuccessful telephone outreach was attempted today to offer the patient information about available complex care management services. Outreach due to ED visits on 04/26/23 and 04/05/23 for abdominal pain.   Follow Up Plan:  Additional outreach attempts will be made to offer the patient complex care management information and services.   Encounter Outcome:  No Answer   Care Coordination Interventions:  No, not indicated    Demetrios Loll, RN, BSN [place]  [facility], [facility] RN Care Manager Direct Dial: [phone]

## 2023-04-29 DIAGNOSIS — Z299 Encounter for prophylactic measures, unspecified: Secondary | ICD-10-CM | POA: Diagnosis not present

## 2023-04-29 DIAGNOSIS — I4891 Unspecified atrial fibrillation: Secondary | ICD-10-CM | POA: Diagnosis not present

## 2023-04-29 DIAGNOSIS — I509 Heart failure, unspecified: Secondary | ICD-10-CM | POA: Diagnosis not present

## 2023-04-29 DIAGNOSIS — I10 Essential (primary) hypertension: Secondary | ICD-10-CM | POA: Diagnosis not present

## 2023-04-29 DIAGNOSIS — K5909 Other constipation: Secondary | ICD-10-CM | POA: Diagnosis not present

## 2023-04-29 DIAGNOSIS — N183 Chronic kidney disease, stage 3 unspecified: Secondary | ICD-10-CM | POA: Diagnosis not present

## 2023-05-12 DIAGNOSIS — H01001 Unspecified blepharitis right upper eyelid: Secondary | ICD-10-CM | POA: Diagnosis not present

## 2023-05-12 DIAGNOSIS — H01005 Unspecified blepharitis left lower eyelid: Secondary | ICD-10-CM | POA: Diagnosis not present

## 2023-05-12 DIAGNOSIS — Z961 Presence of intraocular lens: Secondary | ICD-10-CM | POA: Diagnosis not present

## 2023-05-12 DIAGNOSIS — H02135 Senile ectropion of left lower eyelid: Secondary | ICD-10-CM | POA: Diagnosis not present

## 2023-05-12 DIAGNOSIS — H02132 Senile ectropion of right lower eyelid: Secondary | ICD-10-CM | POA: Diagnosis not present

## 2023-05-12 DIAGNOSIS — G500 Trigeminal neuralgia: Secondary | ICD-10-CM | POA: Diagnosis not present

## 2023-05-12 DIAGNOSIS — H01004 Unspecified blepharitis left upper eyelid: Secondary | ICD-10-CM | POA: Diagnosis not present

## 2023-05-12 DIAGNOSIS — H04123 Dry eye syndrome of bilateral lacrimal glands: Secondary | ICD-10-CM | POA: Diagnosis not present

## 2023-05-12 DIAGNOSIS — H01002 Unspecified blepharitis right lower eyelid: Secondary | ICD-10-CM | POA: Diagnosis not present

## 2023-05-18 DIAGNOSIS — Z515 Encounter for palliative care: Secondary | ICD-10-CM | POA: Diagnosis not present

## 2023-05-18 DIAGNOSIS — Z7409 Other reduced mobility: Secondary | ICD-10-CM | POA: Diagnosis not present

## 2023-05-18 DIAGNOSIS — K5900 Constipation, unspecified: Secondary | ICD-10-CM | POA: Diagnosis not present

## 2023-05-18 DIAGNOSIS — R5383 Other fatigue: Secondary | ICD-10-CM | POA: Diagnosis not present

## 2023-05-18 DIAGNOSIS — I5032 Chronic diastolic (congestive) heart failure: Secondary | ICD-10-CM | POA: Diagnosis not present

## 2023-05-18 DIAGNOSIS — R634 Abnormal weight loss: Secondary | ICD-10-CM | POA: Diagnosis not present

## 2023-05-18 DIAGNOSIS — I4891 Unspecified atrial fibrillation: Secondary | ICD-10-CM | POA: Diagnosis not present

## 2023-05-26 DIAGNOSIS — M9701XD Periprosthetic fracture around internal prosthetic right hip joint, subsequent encounter: Secondary | ICD-10-CM | POA: Diagnosis not present

## 2023-05-26 DIAGNOSIS — R2689 Other abnormalities of gait and mobility: Secondary | ICD-10-CM | POA: Diagnosis not present

## 2023-05-26 DIAGNOSIS — M6281 Muscle weakness (generalized): Secondary | ICD-10-CM | POA: Diagnosis not present

## 2023-06-11 DIAGNOSIS — I10 Essential (primary) hypertension: Secondary | ICD-10-CM | POA: Diagnosis not present

## 2023-06-11 DIAGNOSIS — I509 Heart failure, unspecified: Secondary | ICD-10-CM | POA: Diagnosis not present

## 2023-06-11 DIAGNOSIS — I4891 Unspecified atrial fibrillation: Secondary | ICD-10-CM | POA: Diagnosis not present

## 2023-06-11 DIAGNOSIS — Z299 Encounter for prophylactic measures, unspecified: Secondary | ICD-10-CM | POA: Diagnosis not present

## 2023-06-11 DIAGNOSIS — Z0000 Encounter for general adult medical examination without abnormal findings: Secondary | ICD-10-CM | POA: Diagnosis not present

## 2023-06-12 DIAGNOSIS — K5909 Other constipation: Secondary | ICD-10-CM | POA: Diagnosis not present

## 2023-06-12 DIAGNOSIS — I4891 Unspecified atrial fibrillation: Secondary | ICD-10-CM | POA: Diagnosis not present

## 2023-06-12 DIAGNOSIS — I509 Heart failure, unspecified: Secondary | ICD-10-CM | POA: Diagnosis not present

## 2023-06-12 DIAGNOSIS — Z299 Encounter for prophylactic measures, unspecified: Secondary | ICD-10-CM | POA: Diagnosis not present

## 2023-06-12 DIAGNOSIS — R52 Pain, unspecified: Secondary | ICD-10-CM | POA: Diagnosis not present

## 2023-06-27 ENCOUNTER — Emergency Department (HOSPITAL_COMMUNITY): Admission: EM | Admit: 2023-06-27 | Discharge: 2023-06-27 | Disposition: A | Discharge status: Home / Self Care

## 2023-06-27 ENCOUNTER — Encounter (HOSPITAL_COMMUNITY): Payer: Self-pay

## 2023-06-27 ENCOUNTER — Emergency Department (HOSPITAL_COMMUNITY)

## 2023-06-27 DIAGNOSIS — R197 Diarrhea, unspecified: Secondary | ICD-10-CM | POA: Diagnosis not present

## 2023-06-27 DIAGNOSIS — K551 Chronic vascular disorders of intestine: Secondary | ICD-10-CM | POA: Diagnosis not present

## 2023-06-27 DIAGNOSIS — K5730 Diverticulosis of large intestine without perforation or abscess without bleeding: Secondary | ICD-10-CM | POA: Diagnosis not present

## 2023-06-27 DIAGNOSIS — Z79899 Other long term (current) drug therapy: Secondary | ICD-10-CM | POA: Insufficient documentation

## 2023-06-27 DIAGNOSIS — Z7901 Long term (current) use of anticoagulants: Secondary | ICD-10-CM | POA: Insufficient documentation

## 2023-06-27 DIAGNOSIS — R1033 Periumbilical pain: Secondary | ICD-10-CM | POA: Diagnosis present

## 2023-06-27 DIAGNOSIS — I7143 Infrarenal abdominal aortic aneurysm, without rupture: Secondary | ICD-10-CM | POA: Diagnosis not present

## 2023-06-27 DIAGNOSIS — R531 Weakness: Secondary | ICD-10-CM | POA: Diagnosis not present

## 2023-06-27 DIAGNOSIS — Z743 Need for continuous supervision: Secondary | ICD-10-CM | POA: Diagnosis not present

## 2023-06-27 DIAGNOSIS — I10 Essential (primary) hypertension: Secondary | ICD-10-CM | POA: Diagnosis not present

## 2023-06-27 DIAGNOSIS — E119 Type 2 diabetes mellitus without complications: Secondary | ICD-10-CM | POA: Diagnosis not present

## 2023-06-27 DIAGNOSIS — R6889 Other general symptoms and signs: Secondary | ICD-10-CM | POA: Diagnosis not present

## 2023-06-27 DIAGNOSIS — R109 Unspecified abdominal pain: Secondary | ICD-10-CM | POA: Diagnosis not present

## 2023-06-27 LAB — CBC WITH DIFFERENTIAL/PLATELET
Abs Immature Granulocytes: 0.04 10*3/uL (ref 0.00–0.07)
Basophils Absolute: 0.1 10*3/uL (ref 0.0–0.1)
Basophils Relative: 1 %
Eosinophils Absolute: 0.1 10*3/uL (ref 0.0–0.5)
Eosinophils Relative: 2 %
HCT: 38.1 % (ref 36.0–46.0)
Hemoglobin: 12.4 g/dL (ref 12.0–15.0)
Immature Granulocytes: 1 %
Lymphocytes Relative: 12 %
Lymphs Abs: 1.1 10*3/uL (ref 0.7–4.0)
MCH: 29.1 pg (ref 26.0–34.0)
MCHC: 32.5 g/dL (ref 30.0–36.0)
MCV: 89.4 fL (ref 80.0–100.0)
Monocytes Absolute: 1 10*3/uL (ref 0.1–1.0)
Monocytes Relative: 11 %
Neutro Abs: 6.4 10*3/uL (ref 1.7–7.7)
Neutrophils Relative %: 73 %
Platelets: 253 10*3/uL (ref 150–400)
RBC: 4.26 MIL/uL (ref 3.87–5.11)
RDW: 13 % (ref 11.5–15.5)
WBC: 8.7 10*3/uL (ref 4.0–10.5)
nRBC: 0 % (ref 0.0–0.2)

## 2023-06-27 LAB — LIPASE, BLOOD: Lipase: 51 U/L (ref 11–51)

## 2023-06-27 LAB — LACTIC ACID, PLASMA
Lactic Acid, Venous: 1 mmol/L (ref 0.5–1.9)
Lactic Acid, Venous: 1 mmol/L (ref 0.5–1.9)

## 2023-06-27 LAB — COMPREHENSIVE METABOLIC PANEL
ALT: 13 U/L (ref 0–44)
AST: 17 U/L (ref 15–41)
Albumin: 3.4 g/dL — ABNORMAL LOW (ref 3.5–5.0)
Alkaline Phosphatase: 72 U/L (ref 38–126)
Anion gap: 14 (ref 5–15)
BUN: 14 mg/dL (ref 8–23)
CO2: 22 mmol/L (ref 22–32)
Calcium: 9.5 mg/dL (ref 8.9–10.3)
Chloride: 96 mmol/L — ABNORMAL LOW (ref 98–111)
Creatinine, Ser: 0.78 mg/dL (ref 0.44–1.00)
GFR, Estimated: >60 mL/min (ref >60)
Glucose, Bld: 120 mg/dL — ABNORMAL HIGH (ref 70–99)
Potassium: 3.8 mmol/L (ref 3.5–5.1)
Sodium: 132 mmol/L — ABNORMAL LOW (ref 135–145)
Total Bilirubin: 0.9 mg/dL (ref 0.0–1.2)
Total Protein: 6.2 g/dL — ABNORMAL LOW (ref 6.5–8.1)

## 2023-06-27 MED ORDER — IOHEXOL 350 MG/ML SOLN
100.0000 mL | Freq: Once | INTRAVENOUS | Status: AC | PRN
  Administered 2023-06-27: 100 mL via INTRAVENOUS

## 2023-06-27 MED ORDER — MORPHINE SULFATE (PF) 4 MG/ML IV SOLN
2.0000 mg | Freq: Once | INTRAVENOUS | Status: AC
  Administered 2023-06-27: 2 mg via INTRAVENOUS
  Filled 2023-06-27: qty 1

## 2023-06-27 MED ORDER — ONDANSETRON HCL 4 MG/2ML IJ SOLN
4.0000 mg | Freq: Once | INTRAMUSCULAR | Status: AC
  Administered 2023-06-27: 4 mg via INTRAVENOUS
  Filled 2023-06-27: qty 2

## 2023-06-27 NOTE — ED Triage Notes (Signed)
 Pt c/o weakness and abdominal pain past "few days" also had diarrhea about 0800

## 2023-06-27 NOTE — Discharge Instructions (Addendum)
 Take 1000 mg of Tylenol every 8 hours and follow-up with the vascular doctor.  It looks like you are already on a blood thinner.  Please follow-up with the vascular doctor to see if they would like to start you on any new medications.  Please start taking a fiber supplement such as Metamucil.  Stop the MiraLAX until you follow-up with your doctor.  Return to the ER for worsening symptoms.

## 2023-06-27 NOTE — ED Provider Notes (Signed)
 [place] EMERGENCY DEPARTMENT AT [facility] Provider Note   CSN: [id number] Arrival date & time: 06/27/23  1100     History  Chief Complaint  Patient presents with   Weakness    Karina [name] is a 89 y.o. female.  89 year old female with past medical history of atrial fibrillation on Eliquis as well as hypertension presenting to the emergency department today with abdominal pain.  The patient states she has been having intermittent issues with abdominal pain now for the past few months.  She states that she occasionally does have nausea but denies any vomiting.  She reports that she has been having normal bowel movements over the past few days.  Denies any blood in her stool or dark stools.  States the pain is mostly periumbilical and to the left side.  This has gradually worsened.  She came to the ER today for further evaluation regarding this.  Denies any associated fevers or chills.  She denies any urinary symptoms.   Weakness      Home Medications Prior to Admission medications   Medication Sig Start Date End Date Taking? Authorizing Provider  apixaban (ELIQUIS) 2.5 MG TABS tablet Take 1 tablet (2.5 mg total) 2 (two) times daily by mouth. 02/23/17   Renne Musca, MD  cloNIDine (CATAPRES) 0.2 MG tablet Take 0.2 mg by mouth 3 (three) times daily.    [provider]  dicyclomine (BENTYL) 20 MG tablet Take 0.5 tablets (10 mg total) by mouth 2 (two) times daily as needed for spasms. 04/26/23   Smitty Knudsen, PA-C  diltiazem (TIAZAC) 120 MG 24 hr capsule Take 120 mg by mouth daily. 12/12/21   [provider]  fluorometholone (FML) 0.1 % ophthalmic suspension Place 1 drop into both eyes 2 (two) times daily. 03/11/23   [provider]  furosemide (LASIX) 20 MG tablet TAKE 1 TABLET BY MOUTH DAILY. MAY TAKE 1 extra TABLET AS NEEDED FOR SWELLING OR SHORTNESS OF BREATH Patient taking differently: Take 20 mg by mouth daily as needed for fluid or  edema. 11/03/22   Sharlene Dory, NP  metoprolol succinate (TOPROL-XL) 25 MG 24 hr tablet Take 25 mg by mouth 2 (two) times daily. 04/17/21   [provider]  mirtazapine (REMERON SOL-TAB) 15 MG disintegrating tablet Take 15 mg by mouth at bedtime.    [provider]  omeprazole (PRILOSEC) 20 MG capsule Take 1 capsule (20 mg total) by mouth daily. 04/26/23   Smitty Knudsen, PA-C  polyethylene glycol powder (GLYCOLAX/MIRALAX) 17 GM/SCOOP powder Take one capful mixed in 6 ounces of liquid three times daily until soft stool. Then continue one capful daily. 04/21/23   Tiffany Kocher, PA-C  valsartan (DIOVAN) 80 MG tablet Take 80 mg by mouth daily.    [provider]      Allergies    Ace inhibitors, Pacerone [amiodarone], Tikosyn [dofetilide], and Meperidine and related    Review of Systems   Review of Systems  Neurological:  Positive for weakness.  All other systems reviewed and are negative.   Physical Exam Updated Vital Signs BP (!) 154/91   Pulse 76   Temp 97.7 F (36.5 C) (Oral)   Resp 18   Ht 5' 1.5" (1.562 m)   Wt 53.1 kg   SpO2 93%   BMI 21.75 kg/m  Physical Exam Vitals and nursing note reviewed.   Gen: NAD Eyes: PERRL, EOMI HEENT: no oropharyngeal swelling Neck: trachea midline Resp: clear to auscultation bilaterally  Card: RRR, no murmurs, rubs, or gallops Abd: periumbilical tenderness noted, no guarding or rebound Extremities: no calf tenderness, no edema Vascular: 2+ radial pulses bilaterally, 2+ DP pulses bilaterally Skin: no rashes Psyc: acting appropriately   ED Results / Procedures / Treatments   Labs (all labs ordered are listed, but only abnormal results are displayed) Labs Reviewed  COMPREHENSIVE METABOLIC PANEL - Abnormal; Notable for the following components:      Result Value   Sodium 132 (*)    Chloride 96 (*)    Glucose, Bld 120 (*)    Total Protein 6.2 (*)    Albumin 3.4 (*)    All other components within normal  limits  CBC WITH DIFFERENTIAL/PLATELET  LIPASE, BLOOD  LACTIC ACID, PLASMA  LACTIC ACID, PLASMA    EKG None  Radiology CT Angio Abd/Pel W and/or Wo Contrast Result Date: 06/27/2023 CLINICAL DATA:  Several day history of abdominal pain and diarrhea with weakness. Concern for mesenteric ischemia. EXAM: CTA ABDOMEN AND PELVIS WITHOUT AND WITH CONTRAST TECHNIQUE: Multidetector CT imaging of the abdomen and pelvis was performed using the standard protocol during bolus administration of intravenous contrast. Multiplanar reconstructed images and MIPs were obtained and reviewed to evaluate the vascular anatomy. RADIATION DOSE REDUCTION: This exam was performed according to the departmental dose-optimization program which includes automated exposure control, adjustment of the mA and/or kV according to patient size and/or use of iterative reconstruction technique. CONTRAST:  OMNIPAQUE IOHEXOL 350 MG/ML SOLN COMPARISON:  CT abdomen and pelvis dated 04/26/2023 and multiple priors FINDINGS: VASCULAR Aorta: Aortic atherosclerosis. Postsurgical changes of infrarenal aortic aneurysm repair with aorto bi-iliac stent, which appears grossly patent. Aneurysm sac measures 6.8 x 6.4 cm, unchanged. Celiac: Patent without evidence of aneurysm, dissection, vasculitis or significant stenosis. SMA: Segmental mild stenosis of the distal superior mesenteric artery extending into branch vessels due to atherosclerotic plaque. No aneurysm or dissection. Renals: Patent proximal right renal artery stent. Moderate stenosis of the proximal left renal artery due to atherosclerotic plaque. IMA: Chronically occluded. Inflow: Unchanged moderate to severe stenosis of the proximal right common iliac artery. 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 Segmental moderate to severe stenosis of the right internal iliac artery.  Proximal Outflow: Segmental mild-to-moderate stenoses of the common, superficial, and profunda femoral arteries. Veins: No obvious venous abnormality within the limitations of this arterial phase study. Review of the MIP images confirms the above findings. NON-VASCULAR Lower chest: Unchanged bilateral lower lobe mild bronchiectasis. No pleural effusion or pneumothorax demonstrated. Multichamber cardiomegaly. Partially imaged pacemaker lead terminates in the right ventricle. Hepatobiliary: No focal hepatic lesions. No intra or extrahepatic biliary ductal dilation. Cholecystectomy. Pancreas: 1.1 cm hypodensity in the pancreatic body (11:26), unchanged from 05/03/2021. No main pancreatic ductal dilation. Spleen: Normal in size without focal abnormality. Adrenals/Urinary Tract: No adrenal nodules. No hydronephrosis or calculi. Multifocal bilateral hypodensities, likely cysts. A hyperattenuating focus within the medial upper pole left kidney measuring 1.8 x 1.3 cm is not substantially changed in size compared to 11/02/2021 but increased in density, likely interval hemorrhage. No focal bladder wall thickening. Stomach/Bowel: Normal appearance of the stomach. No evidence of bowel wall thickening, distention, or inflammatory changes. Colonic diverticulosis without acute diverticulitis. Appendix is not discretely seen. Lymphatic: No enlarged abdominal or pelvic lymph nodes. Reproductive: No adnexal masses. Other: No free fluid, fluid collection, or free air. Musculoskeletal: No acute or abnormal lytic or blastic  osseous lesions. Right hip arthroplasty. Multilevel degenerative changes of the partially imaged thoracic and lumbar spine. L2-L5 spinal fixation. Hardware appears intact. Unchanged compression deformity of T11 and superior endplate of T12. Postsurgical changes of the bilateral inguinal regions. IMPRESSION: 1. No acute vascular abnormality. Unchanged segmental mild stenosis of the distal superior mesenteric artery  extending into branch vessels due to atherosclerotic plaque. Chronically occluded IMA. 2. Postsurgical changes of infrarenal aortic aneurysm repair with patent aorto bi-iliac stent. Unchanged aneurysm sac measures 6.8 x 6.4 cm. 3. Colonic diverticulosis without acute diverticulitis. 4. 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 Follow-up imaging in 2 years can be considered with contrast-enhanced MRI/MRCP according to the patient's goals of care. 5.  Aortic Atherosclerosis (ICD10-I70.0). Electronically Signed   By: Agustin Cree M.D.   On: 06/27/2023 13:51    Procedures Procedures    Medications Ordered in ED Medications  morphine (PF) 4 MG/ML injection 2 mg (2 mg Intravenous Given 06/27/23 1255)  ondansetron (ZOFRAN) injection 4 mg (4 mg Intravenous Given 06/27/23 1254)  iohexol (OMNIPAQUE) 350 MG/ML injection 100 mL (100 mLs Intravenous Contrast Given 06/27/23 1241)    ED Course/ Medical Decision Making/ A&P                                 Medical Decision Making 89 year old female with past medical history of A-fib and diabetes as well as hypertension presenting to the emergency department today with worsening abdominal pain.  I will further evaluate patient here with basic labs including LFTs and a lipase to evaluate for hepatobiliary otology or pancreatitis.  I will repeat a CT scan.  I will obtain a CT angiogram this time to evaluate for mesenteric ischemia in addition to diverticulitis, colitis, obstruction, or other intra-abdominal pathology.  I will give the patient morphine and Zofran for symptoms and reevaluate for ultimate disposition.  The patient CTA shows some chronic mesenteric ischemia.  Her lactate is within normal limits.  I did call and discussed this with Dr. Pascal Lux.  He recommends outpatient follow-up.  The patient remained stable here in the emergency department and is discharged with  return precautions.  Amount and/or Complexity of Data Reviewed Labs: ordered. Radiology: ordered.  Risk Prescription drug management.           Final Clinical Impression(s) / ED Diagnoses Final diagnoses:  Chronic mesenteric ischemia [facility])    Rx / DC Orders ED Discharge Orders     None         Durwin Glaze, MD 06/27/23 [id number]

## 2023-06-29 ENCOUNTER — Encounter: Payer: Self-pay | Admitting: Vascular Surgery

## 2023-06-29 ENCOUNTER — Telehealth: Payer: Self-pay | Admitting: Vascular Surgery

## 2023-06-29 NOTE — Telephone Encounter (Signed)
 Pt appt scheduled

## 2023-07-01 ENCOUNTER — Encounter: Payer: Self-pay | Admitting: Vascular Surgery

## 2023-07-01 ENCOUNTER — Ambulatory Visit (INDEPENDENT_AMBULATORY_CARE_PROVIDER_SITE_OTHER): Admitting: Vascular Surgery

## 2023-07-01 VITALS — BP 131/86 | HR 79 | Temp 97.9°F

## 2023-07-01 DIAGNOSIS — R109 Unspecified abdominal pain: Secondary | ICD-10-CM

## 2023-07-01 NOTE — Progress Notes (Signed)
 Patient ID: Karina [name], female   DOB: 1922-08-21, 89 y.o.   MRN: [id number]  Reason for Consult: New Patient (Initial Visit)   Referred by Kirstie Peri, MD  Subjective:     HPI:  Karina [name] is a 89 y.o. female here for evaluation chronic mesenteric ischemia.  She does have a history of endovascular aneurysm repair performed at [facility] several years ago.  More recently she has 3 separate episodes of presentation to the emergency department for abdominal pain.  She states the pain is chronic, intermittent, crampy and now moderate to severe in nature and becoming more intense both in severity and timing.  She has not had any change in her bowel function although she states she has chronic constipation.  She denies any fevers or chills associated and does not have any vomiting.  She does not have any food fear and the pain is not related to eating.  She has lost weight mostly from just avoiding food due to ongoing pain.  She states the pain is alleviating with getting her pants off of around her waist where it feels tight but she has not had any medications that help.  She does take her medications as previously prescribed but her son who is on the telephone is not sure if she is taking them as directed.  She is on anticoagulation with Eliquis.  Past Medical History:  Diagnosis Date   Actinic keratosis    Bilateral leg weakness 09/27/12   CAD (coronary artery disease)    EF 55% by 2 D Echo 2008 and 2012   Chronic atrial fibrillation (HCC)    Chronic edema 10/15/11   Chronic fatigue    DDD (degenerative disc disease)    Encounter for long-term (current) use of other medications 09/02/12   Endometrial carcinoma (HCC)    Fibromuscular dysplasia (HCC)    S/P PCI right renal aretery 1999   GERD (gastroesophageal reflux disease)    Hip fracture, right (HCC) 08/06/06   S/P surgery   Hx of cardiovascular stress test 09/2010   Nuclear stress testing showing no evidence of ischemia, EF=76%, No EKG  changes & no perfusion defects.   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   LVH (left ventricular hypertrophy)    Mild   Osteoarthritis    Osteoporosis    With Hx of thoracic compression fractures   Pacemaker    Paroxysmal atrial fibrillation (HCC)    Peripheral neuropathy    Peripheral vascular disease (HCC)    Pre-diabetes 01/21/11   Chronic   Renovascular hypertension    Tachy-brady syndrome (HCC)    Thoracic compression fracture (HCC)    TIA (transient ischemic attack) 08/2011   OSH: flushing, left LE numbness, CT negative   Tic douloureux 1994   S/P successful surgery   Trigeminal neuralgia of right side of face    Family History  Problem Relation Age of Onset   Stroke Father    COPD Brother    Diabetes Brother        DM   Hypertension Brother    Stroke Other    Past Surgical History:  Procedure Laterality Date   ABDOMINAL AORTAGRAM  09/05/01   Right & left renals 25%   APPENDECTOMY  1946   CARDIAC CATHETERIZATION  11/05/01   EF 72%.  RCA 25%. LCX 50%. Ramus 75/100%. LAD 95%. D2 75%.   CESAREAN SECTION  1959   CESAREAN SECTION  1961   CHOLECYSTECTOMY  1989   CORONARY ANGIOPLASTY     CORONARY ANGIOPLASTY WITH STENT PLACEMENT  11/05/01   PTCA/Stent to 95% mid LAD, Ramus lesion could not be crossed and was not treated.    DIPYRIDAMOLE STRESS TEST  09/2010   Nuclear stress testing showing no evidence of ischemia, EF=76%, No EKG changes & no perfusion defects.   LUMBAR DISC SURGERY     ORIF FEMUR FRACTURE Right 02/16/2017   Procedure: OPEN REDUCTION INTERNAL FIXATION (ORIF) DISTAL FEMUR FRACTURE;  Surgeon: Roby Lofts, MD;  Location: [facility] OR;  Service: Orthopedics;  Laterality: Right;   RENAL ANGIOGRAM  09/14/97   25% diffuse left renal artery. 50% ostia; right renal artery.   RENAL ARTERY ANGIOPLASTY Right 1994   Renal  artery stenosis secondary to fibromusclar dysplasia   RENAL ARTERY ANGIOPLASTY Right 09/13/97   PTA/Stent to ostial right renal artery, No distal fibromuscular hyperplasia   SPINAL FUSION  2002   Percutaneous spinal fusion   TOTAL ABDOMINAL HYSTERECTOMY W/ BILATERAL SALPINGOOPHORECTOMY  1990   VAGINAL HYSTERECTOMY  1990    Short Social History:  Social History   Tobacco Use   Smoking status: Never    Passive exposure: Never   Smokeless tobacco: Never  Substance Use Topics   Alcohol use: Yes    Alcohol/week: 0.0 standard drinks of alcohol    Comment: Rarely    Allergies  Allergen Reactions   Ace Inhibitors Hives   Pacerone [Amiodarone] Other (See Comments)    Worsening peripheral neuropathy   Tikosyn [Dofetilide] Other (See Comments)    QT prolongation   Meperidine And Related Other (See Comments)    Unknown reaction    Current Outpatient Medications  Medication Sig Dispense Refill   apixaban (ELIQUIS) 2.5 MG TABS tablet Take 1 tablet (2.5 mg total) 2 (two) times daily by mouth. 60 tablet 1   cloNIDine (CATAPRES) 0.2 MG tablet Take 0.2 mg by mouth 3 (three) times daily.     dicyclomine (BENTYL) 20 MG tablet Take 0.5 tablets (10 mg total) by mouth 2 (two) times daily as needed for spasms. 20 tablet 0   diltiazem (TIAZAC) 120 MG 24 hr capsule Take 120 mg by mouth daily.     fluorometholone (FML) 0.1 % ophthalmic suspension Place 1 drop into both eyes 2 (two) times daily.     furosemide (LASIX) 20 MG tablet TAKE 1 TABLET BY MOUTH DAILY. MAY TAKE 1 extra TABLET AS NEEDED FOR SWELLING OR SHORTNESS OF BREATH (Patient taking differently: Take 20 mg by mouth daily as needed for fluid or edema.) 30 tablet 2   metoprolol succinate (TOPROL-XL) 25 MG 24 hr tablet Take 25 mg by mouth 2 (two) times daily.     mirtazapine (REMERON SOL-TAB) 15 MG disintegrating tablet Take 15 mg by mouth at bedtime.     omeprazole (PRILOSEC) 20 MG capsule Take 1 capsule (20 mg total) by mouth daily. 30  capsule 0   polyethylene glycol powder (GLYCOLAX/MIRALAX) 17 GM/SCOOP powder Take one capful mixed in 6 ounces of liquid three times daily until soft stool. Then continue one capful daily. 255 g 3   valsartan (DIOVAN) 80 MG tablet Take 80 mg by mouth daily.     No current facility-administered medications for this visit.    Review of Systems  Constitutional: Positive for unexpected weight change.  HENT: HENT negative.  Eyes: Eyes negative.  GI: Positive for abdominal pain.  Musculoskeletal: Musculoskeletal negative.  Skin: Skin negative.  Neurological: Neurological negative. Hematologic: Hematologic/lymphatic negative.  Psychiatric: Psychiatric negative.        Objective:  Objective   Vitals:   07/01/23 1455  BP: 131/86  Pulse: 79  Temp: 97.9 F (36.6 C)  SpO2: 96%   There is no height or weight on file to calculate BMI.  Physical Exam HENT:     Head: Normocephalic.  Eyes:     Pupils: Pupils are equal, round, and reactive to light.  Cardiovascular:     Rate and Rhythm: Normal rate.     Pulses: Normal pulses.  Pulmonary:     Effort: Pulmonary effort is normal.  Abdominal:     General: Abdomen is flat.  Musculoskeletal:     Cervical back: Normal range of motion and neck supple.     Right lower leg: Edema present.     Left lower leg: Edema present.  Skin:    General: Skin is warm.     Capillary Refill: Capillary refill takes less than 2 seconds.  Neurological:     General: No focal deficit present.     Mental Status: She is alert.  Psychiatric:        Mood and Affect: Mood normal.        Thought Content: Thought content normal.        Judgment: Judgment normal.     Data: CT IMPRESSION: 1. No acute vascular abnormality. Unchanged segmental mild stenosis of the distal superior mesenteric artery extending into branch vessels due to atherosclerotic plaque. Chronically occluded IMA. 2. Postsurgical changes of infrarenal aortic aneurysm repair with patent  aorto bi-iliac stent. Unchanged aneurysm sac measures 6.8 x 6.4 cm. 3. Colonic diverticulosis without acute diverticulitis. 4. 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 Follow-up imaging in 2 years can be considered with contrast-enhanced MRI/MRCP according to the patient's goals of care. 5.  Aortic Atherosclerosis (ICD10-I70.0).     Assessment/Plan:    89 year old female sent for evaluation of chronic mesenteric ischemia with known chronically occluded IMA secondary to endovascular aneurysm repair and disease in her distal SMA.  I have reviewed the CT scan with both the patient and her son via telephone and we discussed that the pattern of disease as well as the symptoms do not fit with chronic mesenteric ischemia and there would really be no way to treat the distal SMA stenoses that are present.  As such I have recommended continued medical management and following up with GI where she has previously been evaluated.  She can see me on an as-needed basis.     Maeola Harman MD [facility]

## 2023-07-07 ENCOUNTER — Ambulatory Visit (INDEPENDENT_AMBULATORY_CARE_PROVIDER_SITE_OTHER): Admitting: Internal Medicine

## 2023-07-07 ENCOUNTER — Encounter: Payer: Self-pay | Admitting: Internal Medicine

## 2023-07-07 VITALS — BP 151/99 | HR 81 | Temp 98.2°F | Wt 116.8 lb

## 2023-07-07 DIAGNOSIS — K219 Gastro-esophageal reflux disease without esophagitis: Secondary | ICD-10-CM | POA: Diagnosis not present

## 2023-07-07 DIAGNOSIS — K5900 Constipation, unspecified: Secondary | ICD-10-CM

## 2023-07-07 MED ORDER — LINACLOTIDE 72 MCG PO CAPS: 72.0000 ug | ORAL_CAPSULE | Freq: Every day | ORAL | 3 refills | Status: DC

## 2023-07-07 NOTE — Progress Notes (Signed)
 Primary Care Physician:  Kirstie Peri, MD Primary Gastroenterologist:  Dr. Jena Gauss  Pre-Procedure History & Physical: HPI:  Karina [name] is a 89 y.o. female here for follow-up chronic abdominal pain in setting of constipation.  Seen previously in our office where she was prescribed MiraLAX twice daily for constipation.  Caregiver today states when she takes a single dose of MiraLAX her legs get weak and she is "washed out" for the rest of the day.  She does move her bowels with this regimen but its only sporadic.  Dr. Alver Fisher tried Linzess previously.  In the ED recently for abdominal pain CTA abdomen done IMA is occluded noncritical narrowing SMA celiac wide open concerns for mesenteric ischemia raise.  However weight is stable it is not postprandial abdominal pain she is not afraid to eat.  GERD well-controlled on omeprazole 20 mg daily nonspecific lesions in her liver and pancreas noted previously.  No further surveillance recommended due to age.  She remains anticoagulated due to atrial fibrillation.   Past Medical History:  Diagnosis Date   Actinic keratosis    Bilateral leg weakness 09/27/12   CAD (coronary artery disease)    EF 55% by 2 D Echo 2008 and 2012   Chronic atrial fibrillation (HCC)    Chronic edema 10/15/11   Chronic fatigue    DDD (degenerative disc disease)    Encounter for long-term (current) use of other medications 09/02/12   Endometrial carcinoma (HCC)    Fibromuscular dysplasia (HCC)    S/P PCI right renal aretery 1999   GERD (gastroesophageal reflux disease)    Hip fracture, right (HCC) 08/06/06   S/P surgery   Hx of cardiovascular stress test 09/2010   Nuclear stress testing showing no evidence of ischemia, EF=76%, No EKG changes & no perfusion defects.   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   LVH (left  ventricular hypertrophy)    Mild   Osteoarthritis    Osteoporosis    With Hx of thoracic compression fractures   Pacemaker    Paroxysmal atrial fibrillation (HCC)    Peripheral neuropathy    Peripheral vascular disease (HCC)    Pre-diabetes 01/21/11   Chronic   Renovascular hypertension    Tachy-brady syndrome (HCC)    Thoracic compression fracture (HCC)    TIA (transient ischemic attack) 08/2011   OSH: flushing, left LE numbness, CT negative   Tic douloureux 1994   S/P successful surgery   Trigeminal neuralgia of right side of face     Past Surgical History:  Procedure Laterality Date   ABDOMINAL AORTAGRAM  09/05/01   Right & left renals 25%   APPENDECTOMY  1946   CARDIAC CATHETERIZATION  11/05/01   EF 72%. RCA 25%. LCX 50%. Ramus 75/100%. LAD 95%. D2 75%.   CESAREAN SECTION  1959   CESAREAN SECTION  1961   CHOLECYSTECTOMY  1989   CORONARY ANGIOPLASTY     CORONARY ANGIOPLASTY WITH STENT PLACEMENT  11/05/01   PTCA/Stent to 95% mid LAD, Ramus lesion could not be crossed and was not treated.    DIPYRIDAMOLE STRESS TEST  09/2010   Nuclear stress testing showing no evidence of ischemia, EF=76%, No EKG changes & no perfusion defects.   LUMBAR DISC SURGERY     ORIF FEMUR FRACTURE Right 02/16/2017  Procedure: OPEN REDUCTION INTERNAL FIXATION (ORIF) DISTAL FEMUR FRACTURE;  Surgeon: Roby Lofts, MD;  Location: [facility] OR;  Service: Orthopedics;  Laterality: Right;   RENAL ANGIOGRAM  09/14/97   25% diffuse left renal artery. 50% ostia; right renal artery.   RENAL ARTERY ANGIOPLASTY Right 1994   Renal artery stenosis secondary to fibromusclar dysplasia   RENAL ARTERY ANGIOPLASTY Right 09/13/97   PTA/Stent to ostial right renal artery, No distal fibromuscular hyperplasia   SPINAL FUSION  2002   Percutaneous spinal fusion   TOTAL ABDOMINAL HYSTERECTOMY W/ BILATERAL SALPINGOOPHORECTOMY  1990   VAGINAL HYSTERECTOMY  1990    Prior to Admission medications   Medication Sig Start Date End Date  Taking? Authorizing Provider  apixaban (ELIQUIS) 2.5 MG TABS tablet Take 1 tablet (2.5 mg total) 2 (two) times daily by mouth. 02/23/17  Yes Renne Musca, MD  cloNIDine (CATAPRES) 0.2 MG tablet Take 0.2 mg by mouth 3 (three) times daily.   Yes [provider]  dicyclomine (BENTYL) 20 MG tablet Take 0.5 tablets (10 mg total) by mouth 2 (two) times daily as needed for spasms. 04/26/23  Yes Smitty Knudsen, PA-C  diltiazem (TIAZAC) 120 MG 24 hr capsule Take 120 mg by mouth daily. 12/12/21  Yes [provider]  fluorometholone (FML) 0.1 % ophthalmic suspension Place 1 drop into both eyes 2 (two) times daily. 03/11/23  Yes [provider]  FT STOOL SOFTENER 100 MG capsule Take 100 mg by mouth 2 (two) times daily. 06/11/23  Yes [provider]  furosemide (LASIX) 20 MG tablet TAKE 1 TABLET BY MOUTH DAILY. MAY TAKE 1 extra TABLET AS NEEDED FOR SWELLING OR SHORTNESS OF BREATH Patient taking differently: Take 20 mg by mouth daily as needed for fluid or edema. 11/03/22  Yes Sharlene Dory, NP  metoprolol succinate (TOPROL-XL) 25 MG 24 hr tablet Take 25 mg by mouth 2 (two) times daily. 04/17/21  Yes [provider]  mirtazapine (REMERON SOL-TAB) 15 MG disintegrating tablet Take 15 mg by mouth at bedtime.   Yes [provider]  MUCINEX 600 MG 12 hr tablet Take 600 mg by mouth 2 (two) times daily. 06/11/23  Yes [provider]  omeprazole (PRILOSEC) 20 MG capsule Take 1 capsule (20 mg total) by mouth daily. 04/26/23  Yes Maryanna Shape A, PA-C  polyethylene glycol powder (GLYCOLAX/MIRALAX) 17 GM/SCOOP powder Take one capful mixed in 6 ounces of liquid three times daily until soft stool. Then continue one capful daily. 04/21/23  Yes Tiffany Kocher, PA-C  valsartan (DIOVAN) 80 MG tablet Take 80 mg by mouth daily.   Yes [provider]    Allergies as of 07/07/2023 - Review Complete 07/07/2023  Allergen Reaction Noted   Ace inhibitors Hives  07/14/2013   Pacerone [amiodarone] Other (See Comments) 07/14/2013   Tikosyn [dofetilide] Other (See Comments) 09/15/2013   Meperidine and related Other (See Comments) 07/14/2013    Family History  Problem Relation Age of Onset   Stroke Father    COPD Brother    Diabetes Brother        DM   Hypertension Brother    Stroke Other     Social History   Socioeconomic History   Marital status: Widowed    Spouse name: Not on file   Number of children: Not on file   Years of education: Not on file   Highest education level: Not on file  Occupational History   Not on file  Tobacco Use  Smoking status: Never    Passive exposure: Never   Smokeless tobacco: Never  Vaping Use   Vaping status: Never Used  Substance and Sexual Activity   Alcohol use: Yes    Alcohol/week: 0.0 standard drinks of alcohol    Comment: Rarely   Drug use: No   Sexual activity: Not Currently  Other Topics Concern   Not on file  Social History Narrative   Not on file   Social Drivers of Health   Financial Resource Strain: Not on file  Food Insecurity: No Food Insecurity (02/06/2022)   Hunger Vital Sign    Worried About Running Out of Food in the Last Year: Never true    Ran Out of Food in the Last Year: Never true  Transportation Needs: No Transportation Needs (02/06/2022)   PRAPARE - Administrator, Civil Service (Medical): No    Lack of Transportation (Non-Medical): No  Physical Activity: Not on file  Stress: Not on file  Social Connections: Unknown (09/18/2021)   Received from [facility], [facility]   Social Network    Social Network: Not on file  Intimate Partner Violence: Not At Risk (02/06/2022)   Humiliation, Afraid, Rape, and Kick questionnaire    Fear of Current or Ex-Partner: No    Emotionally Abused: No    Physically Abused: No    Sexually Abused: No    Review of Systems: See HPI, otherwise negative ROS  Physical Exam: BP (!) 151/99 (BP Location: Left Arm,  Patient Position: Sitting, Cuff Size: Normal)   Pulse 80   Temp 98.2 F (36.8 C) (Oral)   Wt 116 lb 12.8 oz (53 kg)   SpO2 95%   BMI 21.71 kg/m  General:   Alert,  pleasant and cooperative in NAD Lungs:  Clear throughout to auscultation.   No wheezes, crackles, or rhonchi. No acute distress. Heart:  Regular rate and rhythm; no murmurs, clicks, rubs,  or gallops. Abdomen: Non-distended, normal bowel sounds.  Soft and nontender without appreciable mass or hepatosplenomegaly.   Impression/Plan: 89 year old lady with chronic abdominal pain temporally related to constipation.  Not able to take MiraLAX due to various symptoms of feeling weak in the legs etc. as outlined above.  She is not afraid to eat she is able to eat and abdominal pain is not temporally related to meal intake.  She has not lost any weight.  I doubt she has critical mesenteric ischemia particularly with review of CTA. I continue to be suspicious that poorly controlled constipation has much to do with her abdominal pain.  Nonspecific lesions in the pancreas and liver likely noncontributory and further evaluation not recommended  Recommendations:  Lets stop MiraLAX  Linzess low dose of 72 mcg gelcap 1 daily take 1 every day.  New prescription dispense 30 with 11 refills  Office visit here in 4 weeks.  Continue omeprazole 20 mg daily.   Notice: This dictation was prepared with Dragon dictation along with smaller phrase technology. Any transcriptional errors that result from this process are unintentional and may not be corrected upon review.

## 2023-07-07 NOTE — Patient Instructions (Addendum)
 It was nice to see you again today!  Lets stop MiraLAX  Lets try Linzess again at a low dose of 72 mcg gelcap 1 daily take 1 every day.  New prescription dispense 30 with 11 refills  Office visit here in 4 weeks.  Continue omeprazole 20 mg daily.

## 2023-07-15 DIAGNOSIS — K5900 Constipation, unspecified: Secondary | ICD-10-CM | POA: Diagnosis not present

## 2023-07-15 DIAGNOSIS — Z515 Encounter for palliative care: Secondary | ICD-10-CM | POA: Diagnosis not present

## 2023-07-15 DIAGNOSIS — I5032 Chronic diastolic (congestive) heart failure: Secondary | ICD-10-CM | POA: Diagnosis not present

## 2023-07-15 DIAGNOSIS — Z7409 Other reduced mobility: Secondary | ICD-10-CM | POA: Diagnosis not present

## 2023-07-15 DIAGNOSIS — R634 Abnormal weight loss: Secondary | ICD-10-CM | POA: Diagnosis not present

## 2023-07-15 DIAGNOSIS — R5383 Other fatigue: Secondary | ICD-10-CM | POA: Diagnosis not present

## 2023-07-20 ENCOUNTER — Telehealth: Payer: Self-pay | Admitting: Internal Medicine

## 2023-07-20 NOTE — Telephone Encounter (Signed)
 Spoke to pt and pt states that she had uncontrollable diarrhea all last week up until Friday. Pt states that she has not had a bm Sat or Sun. Pt stated that she never stopped taking the Linzess last week during the episode of uncontrollable diarrhea. Pt was instructed not to take the Linzess if she had diarrhea in the future. Pt is now worried about not having a bm Sat or Sun. Pt is going to have aid call to give me more information.

## 2023-07-20 NOTE — Telephone Encounter (Signed)
 Patient left a message that she needed advice.  She didn't say on what.  She is currently scheduled for an appt to see Dr. Riley Cheadle 4/29.

## 2023-07-22 NOTE — Telephone Encounter (Signed)
 Spoke to patient's aide and she stated that the pt is having normal bm's once daily. Stated that Sat she did have one loose stool but hasn't since then. States that pt tends to wait until the last minute to go to the bathroom and will have an accident from time to time in her pull up. States that the linzess seems to be working well for the patient.

## 2023-07-23 ENCOUNTER — Encounter (HOSPITAL_COMMUNITY)

## 2023-07-23 DIAGNOSIS — Z299 Encounter for prophylactic measures, unspecified: Secondary | ICD-10-CM | POA: Diagnosis not present

## 2023-07-23 DIAGNOSIS — R52 Pain, unspecified: Secondary | ICD-10-CM | POA: Diagnosis not present

## 2023-07-23 DIAGNOSIS — I509 Heart failure, unspecified: Secondary | ICD-10-CM | POA: Diagnosis not present

## 2023-07-23 DIAGNOSIS — R531 Weakness: Secondary | ICD-10-CM | POA: Diagnosis not present

## 2023-07-23 DIAGNOSIS — K5909 Other constipation: Secondary | ICD-10-CM | POA: Diagnosis not present

## 2023-07-23 DIAGNOSIS — I10 Essential (primary) hypertension: Secondary | ICD-10-CM | POA: Diagnosis not present

## 2023-07-23 DIAGNOSIS — I4891 Unspecified atrial fibrillation: Secondary | ICD-10-CM | POA: Diagnosis not present

## 2023-07-26 ENCOUNTER — Other Ambulatory Visit: Payer: Self-pay | Admitting: Nurse Practitioner

## 2023-07-29 ENCOUNTER — Encounter: Admitting: Vascular Surgery

## 2023-08-03 DIAGNOSIS — R2681 Unsteadiness on feet: Secondary | ICD-10-CM | POA: Diagnosis not present

## 2023-08-03 DIAGNOSIS — M6281 Muscle weakness (generalized): Secondary | ICD-10-CM | POA: Diagnosis not present

## 2023-08-04 ENCOUNTER — Encounter: Payer: Self-pay | Admitting: Internal Medicine

## 2023-08-04 ENCOUNTER — Ambulatory Visit: Admitting: Internal Medicine

## 2023-08-04 VITALS — BP 143/87 | HR 80 | Temp 97.3°F | Ht 61.5 in | Wt 115.0 lb

## 2023-08-04 DIAGNOSIS — K5909 Other constipation: Secondary | ICD-10-CM

## 2023-08-04 DIAGNOSIS — K5900 Constipation, unspecified: Secondary | ICD-10-CM

## 2023-08-04 DIAGNOSIS — K219 Gastro-esophageal reflux disease without esophagitis: Secondary | ICD-10-CM | POA: Diagnosis not present

## 2023-08-04 NOTE — Progress Notes (Signed)
 Primary Care Physician:  Theoplis Fix, MD Primary Gastroenterologist:  Dr. Riley Cheadle  Pre-Procedure History & Physical: HPI:  Karina [name] is a 89 y.o. female here for abdominal pain  temporally related to constipation;  started on Linzess  72 (1) month ago.  Caregiver accompanies patient today.  Stool diary provided which more or less indicates 1 bowel movement daily.  Patient having much less abdominal pain.  Appears a little confused today.  Also, on omeprazole  20 mg daily and continues to control reflux.   Past Medical History:  Diagnosis Date   Actinic keratosis    Bilateral leg weakness 09/27/12   CAD (coronary artery disease)    EF 55% by 2 D Echo 2008 and 2012   Chronic atrial fibrillation (HCC)    Chronic edema 10/15/11   Chronic fatigue    DDD (degenerative disc disease)    Encounter for long-term (current) use of other medications 09/02/12   Endometrial carcinoma (HCC)    Fibromuscular dysplasia (HCC)    S/P PCI right renal aretery 1999   GERD (gastroesophageal reflux disease)    Hip fracture, right (HCC) 08/06/06   S/P surgery   Hx of cardiovascular stress test 09/2010   Nuclear stress testing showing no evidence of ischemia, EF=76%, No EKG changes & no perfusion defects.   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   LVH (left ventricular hypertrophy)    Mild   Osteoarthritis    Osteoporosis    With Hx of thoracic compression fractures   Pacemaker    Paroxysmal atrial fibrillation (HCC)    Peripheral neuropathy    Peripheral vascular disease (HCC)    Pre-diabetes 01/21/11   Chronic   Renovascular hypertension    Tachy-brady syndrome (HCC)    Thoracic compression fracture (HCC)    TIA (transient ischemic attack) 08/2011   OSH: flushing, left LE numbness, CT negative   Tic douloureux 1994   S/P successful surgery    Trigeminal neuralgia of right side of face     Past Surgical History:  Procedure Laterality Date   ABDOMINAL AORTAGRAM  09/05/01   Right & left renals 25%   APPENDECTOMY  1946   CARDIAC CATHETERIZATION  11/05/01   EF 72%. RCA 25%. LCX 50%. Ramus 75/100%. LAD 95%. D2 75%.   CESAREAN SECTION  1959   CESAREAN SECTION  1961   CHOLECYSTECTOMY  1989   CORONARY ANGIOPLASTY     CORONARY ANGIOPLASTY WITH STENT PLACEMENT  11/05/01   PTCA/Stent to 95% mid LAD, Ramus lesion could not be crossed and was not treated.    DIPYRIDAMOLE STRESS TEST  09/2010   Nuclear stress testing showing no evidence of ischemia, EF=76%, No EKG changes & no perfusion defects.   LUMBAR DISC SURGERY     ORIF FEMUR FRACTURE Right 02/16/2017   Procedure: OPEN REDUCTION INTERNAL FIXATION (ORIF) DISTAL FEMUR FRACTURE;  Surgeon: Laneta Pintos, MD;  Location: [facility];  Service: Orthopedics;  Laterality: Right;   RENAL ANGIOGRAM  09/14/97   25% diffuse left renal artery. 50% ostia; right renal artery.   RENAL ARTERY ANGIOPLASTY Right 1994   Renal artery stenosis secondary to fibromusclar dysplasia   RENAL ARTERY ANGIOPLASTY Right 09/13/97   PTA/Stent to ostial right renal artery, No distal fibromuscular hyperplasia   SPINAL FUSION  2002  Percutaneous spinal fusion   TOTAL ABDOMINAL HYSTERECTOMY W/ BILATERAL SALPINGOOPHORECTOMY  1990   VAGINAL HYSTERECTOMY  1990    Prior to Admission medications   Medication Sig Start Date End Date Taking? Authorizing Provider  apixaban  (ELIQUIS ) 2.5 MG TABS tablet Take 1 tablet (2.5 mg total) 2 (two) times daily by mouth. 02/23/17   Elleni Mozingo Chimes, MD  cloNIDine  (CATAPRES ) 0.2 MG tablet Take 0.2 mg by mouth 3 (three) times daily.    [provider]  dicyclomine  (BENTYL ) 20 MG tablet Take 0.5 tablets (10 mg total) by mouth 2 (two) times daily as needed for spasms. 04/26/23   Zelaya, Oscar A, PA-C  diltiazem  (TIAZAC ) 120 MG 24 hr capsule Take 120 mg by mouth daily. 12/12/21   [provider]  fluorometholone (FML) 0.1 % ophthalmic suspension Place 1 drop into both eyes 2 (two) times daily. 03/11/23   [provider]  FT STOOL SOFTENER 100 MG capsule Take 100 mg by mouth 2 (two) times daily. 06/11/23   [provider]  furosemide  (LASIX ) 20 MG tablet TAKE 1 TABLET BY MOUTH DAILY. MAY TAKE 1 extra TABLET AS NEEDED FOR SWELLING OR SHORTNESS OF BREATH 07/27/23   Mallipeddi, Vishnu P, MD  linaclotide  (LINZESS ) 72 MCG capsule Take 1 capsule (72 mcg total) by mouth daily before breakfast. 07/07/23   Jasiah Buntin, Windsor Hatcher, MD  metoprolol  succinate (TOPROL -XL) 25 MG 24 hr tablet Take 25 mg by mouth 2 (two) times daily. 04/17/21   [provider]  mirtazapine  (REMERON  SOL-TAB) 15 MG disintegrating tablet Take 15 mg by mouth at bedtime.    [provider]  MUCINEX 600 MG 12 hr tablet Take 600 mg by mouth 2 (two) times daily. 06/11/23   [provider]  omeprazole  (PRILOSEC) 20 MG capsule Take 1 capsule (20 mg total) by mouth daily. 04/26/23   Zelaya, Oscar A, PA-C  polyethylene glycol powder (GLYCOLAX /MIRALAX ) 17 GM/SCOOP powder Take one capful mixed in 6 ounces of liquid three times daily until soft stool. Then continue one capful daily. 04/21/23   Lanney Pitts, PA-C  valsartan  (DIOVAN ) 80 MG tablet Take 80 mg by mouth daily.    [provider]    Allergies as of 08/04/2023 - Review Complete 07/07/2023  Allergen Reaction Noted   Ace inhibitors Hives 07/14/2013   Pacerone [amiodarone] Other (See Comments) 07/14/2013   Tikosyn [dofetilide] Other (See Comments) 09/15/2013   Meperidine  and related Other (See Comments) 07/14/2013    Family History  Problem Relation Age of Onset   Stroke Father    COPD Brother    Diabetes Brother        DM   Hypertension Brother    Stroke Other     Social History   Socioeconomic History   Marital status: Widowed    Spouse name: Not on file   Number of children: Not on file   Years of education:  Not on file   Highest education level: Not on file  Occupational History   Not on file  Tobacco Use   Smoking status: Never    Passive exposure: Never   Smokeless tobacco: Never  Vaping Use   Vaping status: Never Used  Substance and Sexual Activity   Alcohol  use: Yes    Alcohol /week: 0.0 standard drinks of alcohol     Comment: Rarely   Drug use: No   Sexual activity: Not Currently  Other Topics Concern   Not on file  Social History Narrative   Not  on file   Social Drivers of Health   Financial Resource Strain: Not on file  Food Insecurity: No Food Insecurity (02/06/2022)   Hunger Vital Sign    Worried About Running Out of Food in the Last Year: Never true    Ran Out of Food in the Last Year: Never true  Transportation Needs: No Transportation Needs (02/06/2022)   PRAPARE - Administrator, Civil Service (Medical): No    Lack of Transportation (Non-Medical): No  Physical Activity: Not on file  Stress: Not on file  Social Connections: Unknown (09/18/2021)   Received from [facility]   Social Network    Social Network: Not on file  Intimate Partner Violence: Not At Risk (02/06/2022)   Humiliation, Afraid, Rape, and Kick questionnaire    Fear of Current or Ex-Partner: No    Emotionally Abused: No    Physically Abused: No    Sexually Abused: No    Review of Systems: See HPI, otherwise negative ROS  Physical Exam: There were no vitals taken for this visit. General:   Alert,  elderly , conversant  pleasant and cooperative in NAD Lungs:  Clear throughout to auscultation.   No wheezes, crackles, or rhonchi. No acute distress. Heart:  Regular rate and rhythm; no murmurs, clicks, rubs,  or gallops. Abdomen: Non-distended, normal bowel sounds.  Soft and nontender without appreciable mass or hepatosplenomegaly.   Impression/Plan: 89 year old lady with chronic constipation temporally associated with abdominal pain now much improved on low-dose Linzess .   She is having regular bowel function and less abdominal pain which is very reassuring.  GERD well-controlled on omeprazole  20 mg daily   Recommendations:  Continue Linzess  1 gelcap daily indefinitely  Also, continue omeprazole  20 mg daily for acid reflux  Office visit here in 6 months    Notice: This dictation was prepared with Dragon dictation along with smaller phrase technology. Any transcriptional errors that result from this process are unintentional and may not be corrected upon review.

## 2023-08-04 NOTE — Patient Instructions (Signed)
 Nice to see you again today!  Bowel function now much better on Linzess  72 gelcap daily  Continue Linzess  1 gelcap daily indefinitely  Also, continue omeprazole  20 mg daily for acid reflux  Office visit here in 6 months

## 2023-08-05 DIAGNOSIS — M6281 Muscle weakness (generalized): Secondary | ICD-10-CM | POA: Diagnosis not present

## 2023-08-05 DIAGNOSIS — R2681 Unsteadiness on feet: Secondary | ICD-10-CM | POA: Diagnosis not present

## 2023-08-10 ENCOUNTER — Encounter: Payer: Self-pay | Admitting: Internal Medicine

## 2023-08-17 DIAGNOSIS — R2681 Unsteadiness on feet: Secondary | ICD-10-CM | POA: Diagnosis not present

## 2023-08-17 DIAGNOSIS — M6281 Muscle weakness (generalized): Secondary | ICD-10-CM | POA: Diagnosis not present

## 2023-08-19 DIAGNOSIS — Z961 Presence of intraocular lens: Secondary | ICD-10-CM | POA: Diagnosis not present

## 2023-08-19 DIAGNOSIS — H02135 Senile ectropion of left lower eyelid: Secondary | ICD-10-CM | POA: Diagnosis not present

## 2023-08-19 DIAGNOSIS — H01004 Unspecified blepharitis left upper eyelid: Secondary | ICD-10-CM | POA: Diagnosis not present

## 2023-08-19 DIAGNOSIS — H04123 Dry eye syndrome of bilateral lacrimal glands: Secondary | ICD-10-CM | POA: Diagnosis not present

## 2023-08-19 DIAGNOSIS — H01002 Unspecified blepharitis right lower eyelid: Secondary | ICD-10-CM | POA: Diagnosis not present

## 2023-08-19 DIAGNOSIS — G500 Trigeminal neuralgia: Secondary | ICD-10-CM | POA: Diagnosis not present

## 2023-08-19 DIAGNOSIS — H01005 Unspecified blepharitis left lower eyelid: Secondary | ICD-10-CM | POA: Diagnosis not present

## 2023-08-19 DIAGNOSIS — R2681 Unsteadiness on feet: Secondary | ICD-10-CM | POA: Diagnosis not present

## 2023-08-19 DIAGNOSIS — H02132 Senile ectropion of right lower eyelid: Secondary | ICD-10-CM | POA: Diagnosis not present

## 2023-08-19 DIAGNOSIS — H01001 Unspecified blepharitis right upper eyelid: Secondary | ICD-10-CM | POA: Diagnosis not present

## 2023-08-19 DIAGNOSIS — M6281 Muscle weakness (generalized): Secondary | ICD-10-CM | POA: Diagnosis not present

## 2023-08-25 DIAGNOSIS — M6281 Muscle weakness (generalized): Secondary | ICD-10-CM | POA: Diagnosis not present

## 2023-08-25 DIAGNOSIS — R2681 Unsteadiness on feet: Secondary | ICD-10-CM | POA: Diagnosis not present

## 2023-08-27 DIAGNOSIS — R2681 Unsteadiness on feet: Secondary | ICD-10-CM | POA: Diagnosis not present

## 2023-08-27 DIAGNOSIS — M6281 Muscle weakness (generalized): Secondary | ICD-10-CM | POA: Diagnosis not present

## 2023-09-01 DIAGNOSIS — K5909 Other constipation: Secondary | ICD-10-CM | POA: Diagnosis not present

## 2023-09-01 DIAGNOSIS — R079 Chest pain, unspecified: Secondary | ICD-10-CM | POA: Diagnosis not present

## 2023-09-01 DIAGNOSIS — I700 Atherosclerosis of aorta: Secondary | ICD-10-CM | POA: Diagnosis not present

## 2023-09-01 DIAGNOSIS — I10 Essential (primary) hypertension: Secondary | ICD-10-CM | POA: Diagnosis not present

## 2023-09-01 DIAGNOSIS — R2681 Unsteadiness on feet: Secondary | ICD-10-CM | POA: Diagnosis not present

## 2023-09-01 DIAGNOSIS — M6281 Muscle weakness (generalized): Secondary | ICD-10-CM | POA: Diagnosis not present

## 2023-09-01 DIAGNOSIS — Z299 Encounter for prophylactic measures, unspecified: Secondary | ICD-10-CM | POA: Diagnosis not present

## 2023-09-03 DIAGNOSIS — M6281 Muscle weakness (generalized): Secondary | ICD-10-CM | POA: Diagnosis not present

## 2023-09-03 DIAGNOSIS — R2681 Unsteadiness on feet: Secondary | ICD-10-CM | POA: Diagnosis not present

## 2023-09-10 DIAGNOSIS — M6281 Muscle weakness (generalized): Secondary | ICD-10-CM | POA: Diagnosis not present

## 2023-09-10 DIAGNOSIS — R2681 Unsteadiness on feet: Secondary | ICD-10-CM | POA: Diagnosis not present

## 2023-09-21 DIAGNOSIS — Z299 Encounter for prophylactic measures, unspecified: Secondary | ICD-10-CM | POA: Diagnosis not present

## 2023-09-21 DIAGNOSIS — K219 Gastro-esophageal reflux disease without esophagitis: Secondary | ICD-10-CM | POA: Diagnosis not present

## 2023-09-21 DIAGNOSIS — I10 Essential (primary) hypertension: Secondary | ICD-10-CM | POA: Diagnosis not present

## 2023-09-21 DIAGNOSIS — K5909 Other constipation: Secondary | ICD-10-CM | POA: Diagnosis not present

## 2023-09-21 DIAGNOSIS — I509 Heart failure, unspecified: Secondary | ICD-10-CM | POA: Diagnosis not present

## 2023-09-22 DIAGNOSIS — M6281 Muscle weakness (generalized): Secondary | ICD-10-CM | POA: Diagnosis not present

## 2023-09-22 DIAGNOSIS — R2681 Unsteadiness on feet: Secondary | ICD-10-CM | POA: Diagnosis not present

## 2023-09-24 ENCOUNTER — Telehealth: Payer: Self-pay | Admitting: Cardiovascular Disease

## 2023-09-24 NOTE — Telephone Encounter (Signed)
 Patient has apt on 09/28/23 with Dr.Mallipeddi. She will need to be examined to determine what medications she may need.

## 2023-09-24 NOTE — Telephone Encounter (Signed)
 New Message:     Patient's son is calling. He lives out of town.Aaron Aas He says Mother have been having choking episode. She have not had one today that he knows of. He said that on Tuesday(09-22-23)patient had a very alarming choking episode.  He would like for patient to be seen next week, between Tuesday and Friday. He will be in town to bring her for her appointment.

## 2023-09-24 NOTE — Telephone Encounter (Signed)
 Called spoke with pt son.  Reports pt has choked on mucus lately.  Had OV with PCP on Monday was placed on Protonix  but was stopped today d/t not feeling that symptoms were r/t GERD.  Also, reports pt needs f/u OV hasn't been seen in over a year.  Son does not live with pt unsure if she is having SOB or weight gain.  Son wonders if pt is congested with fluid d/t heart.  Doesn't know if pt has taken any PRN furosemide .   I called pt home phone to further assess pt symptoms.  No answer; will send to [facility] office to f/u.

## 2023-09-28 ENCOUNTER — Ambulatory Visit: Admitting: Internal Medicine

## 2023-09-28 DIAGNOSIS — R2681 Unsteadiness on feet: Secondary | ICD-10-CM | POA: Diagnosis not present

## 2023-09-28 DIAGNOSIS — M6281 Muscle weakness (generalized): Secondary | ICD-10-CM | POA: Diagnosis not present

## 2023-09-30 ENCOUNTER — Ambulatory Visit: Attending: Internal Medicine | Admitting: Internal Medicine

## 2023-09-30 VITALS — BP 128/82 | HR 81 | Ht 61.5 in | Wt 114.6 lb

## 2023-09-30 DIAGNOSIS — I2510 Atherosclerotic heart disease of native coronary artery without angina pectoris: Secondary | ICD-10-CM

## 2023-09-30 DIAGNOSIS — R2681 Unsteadiness on feet: Secondary | ICD-10-CM | POA: Diagnosis not present

## 2023-09-30 DIAGNOSIS — I509 Heart failure, unspecified: Secondary | ICD-10-CM

## 2023-09-30 DIAGNOSIS — M6281 Muscle weakness (generalized): Secondary | ICD-10-CM | POA: Diagnosis not present

## 2023-09-30 NOTE — Progress Notes (Signed)
 Cardiology Office Note  Date: 09/30/2023   ID: Karina [name], DOB 09-15-1922, MRN [id number]  PCP:  Maree Isles, MD  Cardiologist:  Diannah SHAUNNA Maywood, MD Electrophysiologist:  Eulas FORBES Furbish, MD   Reason for Office Visit: Posthospitalization follow-up   History of Present Illness: Karina [name] is a 89 y.o. female known to have chronic A-fib s/p PPM, CAD s/p LAD PCI, fibromuscular dysplasia s/p right renal artery stenting in 1999, AAA status post EVAR, poorly controlled HTN, prediabetes, history of TIA, peripheral neuropathy presented to cardiology clinic for follow-up visit. She was previously followed by [facility] cardiology and wanted to follow-up with cardiology closer to her home.  Accompanied by CNA previously and currently, son.  Patient continued to have generalized body weakness.  She has loss of appetite and has been eating barely every day.  Does not drink enough water.  No angina.  Not much active at home, no DOE.  Patient son is concerned that she is bringing up mucus and has choking episodes at night.  He is worried she might have congestive heart failure.  No syncope.  She has a hoarse voice.  Past Medical History:  Diagnosis Date   Actinic keratosis    Bilateral leg weakness 09/27/12   CAD (coronary artery disease)    EF 55% by 2 D Echo 2008 and 2012   Chronic atrial fibrillation (HCC)    Chronic edema 10/15/11   Chronic fatigue    DDD (degenerative disc disease)    Encounter for long-term (current) use of other medications 09/02/12   Endometrial carcinoma (HCC)    Fibromuscular dysplasia (HCC)    S/P PCI right renal aretery 1999   GERD (gastroesophageal reflux disease)    Hip fracture, right (HCC) 08/06/06   S/P surgery   Hx of cardiovascular stress test 09/2010   Nuclear stress testing showing no evidence of ischemia, EF=76%, No EKG changes & no perfusion defects.   Hx of echocardiogram 05/2012    EF >55%, LA 3.9 cm, mild LVH   Hyperlipidemia     Hypertension    Difficult to control   Hyponatremia    Hypomatremia/SIADH, possibly secondary to Tegretol   LVH (left ventricular hypertrophy)    Mild   Osteoarthritis    Osteoporosis    With Hx of thoracic compression fractures   Pacemaker    Paroxysmal atrial fibrillation (HCC)    Peripheral neuropathy    Peripheral vascular disease (HCC)    Pre-diabetes 01/21/11   Chronic   Renovascular hypertension    Tachy-brady syndrome (HCC)    Thoracic compression fracture (HCC)    TIA (transient ischemic attack) 08/2011   OSH: flushing, left LE numbness, CT negative   Tic douloureux 1994   S/P successful surgery   Trigeminal neuralgia of right side of face     Past Surgical History:  Procedure Laterality Date   ABDOMINAL AORTAGRAM  09/05/01   Right & left renals 25%   APPENDECTOMY  1946   CARDIAC CATHETERIZATION  11/05/01   EF 72%. RCA 25%. LCX 50%. Ramus 75/100%. LAD 95%. D2 75%.   CESAREAN SECTION  1959   CESAREAN SECTION  1961   CHOLECYSTECTOMY  1989   CORONARY ANGIOPLASTY     CORONARY ANGIOPLASTY WITH STENT PLACEMENT  11/05/01   PTCA/Stent to 95% mid LAD, Ramus lesion could not be crossed and was not treated.    DIPYRIDAMOLE STRESS TEST  09/2010   Nuclear stress testing showing no evidence of ischemia, EF=76%, No EKG  changes & no perfusion defects.   LUMBAR DISC SURGERY     ORIF FEMUR FRACTURE Right 02/16/2017   Procedure: OPEN REDUCTION INTERNAL FIXATION (ORIF) DISTAL FEMUR FRACTURE;  Surgeon: Kendal Franky SQUIBB, MD;  Location: [facility] OR;  Service: Orthopedics;  Laterality: Right;   RENAL ANGIOGRAM  09/14/97   25% diffuse left renal artery. 50% ostia; right renal artery.   RENAL ARTERY ANGIOPLASTY Right 1994   Renal artery stenosis secondary to fibromusclar dysplasia   RENAL ARTERY ANGIOPLASTY Right 09/13/97   PTA/Stent to ostial right renal artery, No distal fibromuscular hyperplasia   SPINAL FUSION  2002   Percutaneous spinal fusion   TOTAL ABDOMINAL HYSTERECTOMY W/ BILATERAL  SALPINGOOPHORECTOMY  1990   VAGINAL HYSTERECTOMY  1990    Current Outpatient Medications  Medication Sig Dispense Refill   apixaban  (ELIQUIS ) 2.5 MG TABS tablet Take 1 tablet (2.5 mg total) 2 (two) times daily by mouth. 60 tablet 1   cloNIDine  (CATAPRES ) 0.2 MG tablet Take 0.2 mg by mouth 3 (three) times daily.     diltiazem  (TIAZAC ) 120 MG 24 hr capsule Take 120 mg by mouth daily.     fluorometholone (FML) 0.1 % ophthalmic suspension Place 1 drop into both eyes 2 (two) times daily.     FT STOOL SOFTENER 100 MG capsule Take 100 mg by mouth 2 (two) times daily.     furosemide  (LASIX ) 20 MG tablet TAKE 1 TABLET BY MOUTH DAILY. MAY TAKE 1 extra TABLET AS NEEDED FOR SWELLING OR SHORTNESS OF BREATH 30 tablet 0   linaclotide  (LINZESS ) 72 MCG capsule Take 1 capsule (72 mcg total) by mouth daily before breakfast. 30 capsule 3   metoprolol  succinate (TOPROL -XL) 25 MG 24 hr tablet Take 25 mg by mouth 2 (two) times daily.     MUCINEX 600 MG 12 hr tablet Take 600 mg by mouth 2 (two) times daily.     polyethylene glycol powder (GLYCOLAX /MIRALAX ) 17 GM/SCOOP powder Take one capful mixed in 6 ounces of liquid three times daily until soft stool. Then continue one capful daily. 255 g 3   traZODone (DESYREL) 50 MG tablet Take 50 mg by mouth daily.     valsartan  (DIOVAN ) 80 MG tablet Take 80 mg by mouth daily.     No current facility-administered medications for this visit.   Allergies:  Ace inhibitors, Pacerone [amiodarone], Tikosyn [dofetilide], and Meperidine  and related   Social History: The patient  reports that she has never smoked. She has never been exposed to tobacco smoke. She has never used smokeless tobacco. She reports current alcohol  use. She reports that she does not use drugs.   Family History: The patient's family history includes COPD in her brother; Diabetes in her brother; Hypertension in her brother; Stroke in her father and another family member.   ROS:  Please see the history of  present illness. Otherwise, complete review of systems is positive for none.  All other systems are reviewed and negative.   Physical Exam: VS:  There were no vitals taken for this visit., BMI There is no height or weight on file to calculate BMI.  Wt Readings from Last 3 Encounters:  08/04/23 115 lb (52.2 kg)  07/07/23 116 lb 12.8 oz (53 kg)  06/27/23 117 lb (53.1 kg)    General: Patient appears comfortable at rest. HEENT: Conjunctiva and lids normal, oropharynx clear with moist mucosa. Neck: Supple, no elevated JVP or carotid bruits, no thyromegaly. Lungs: Clear to auscultation, nonlabored breathing at rest. Cardiac: Regular  rate and rhythm, no S3 or significant systolic murmur, no pericardial rub. Abdomen: Soft, nontender, no hepatomegaly, bowel sounds present, no guarding or rebound. Extremities: No pitting edema, distal pulses 2+. Skin: Warm and dry. Musculoskeletal: No kyphosis. Neuropsychiatric: Alert and oriented x3, affect grossly appropriate.  ECG: Ventricular paced rhythm, underlying atrial fibrillation rhythm  Recent Labwork: 06/27/2023: ALT 13; AST 17; BUN 14; Creatinine, Ser 0.78; Hemoglobin 12.4; Platelets 253; Potassium 3.8; Sodium 132  No results found for: CHOL, TRIG, HDL, CHOLHDL, VLDL, LDLCALC, LDLDIRECT  Other Studies Reviewed Today: Echocardiogram in 10/2021 LVEF normal RV systolic function normal Biatrial enlargement, severe Mild aortic valve regurgitation  Assessment and Plan:  Productive cough and choking spells at night: Patient son is worried she might have congestive heart failure.  She has productive cough and choking spells at night.  She is not much active at home, does not have DOE.  Has 1+ pitting edema around and just above the ankles.  Obtain BNP and echocardiogram.  Prior echocardiogram from 2023 showed normal LVEF, normal RV function, mild AR and severe biatrial enlargement.  Continue p.o. Lasix  20 mg once daily.  Generalized body  weakness: Follows with physical therapy.  She has no appetite, barely eating daily.  Does not drink much water daily.  Strongly encouraged p.o. hydration and nutrition.  HTN, controlled: Continue current antihypertensives, clonidine  0.2 mg TID, metoprolol  succinate 25 mg twice daily, valsartan  80 mg once daily and p.o. Lasix  20 mg once daily.  CAD a/p LAD PCI (remote history) and history of TIA: Not on aspirin  due to Eliquis  use, continue rosuvastatin  5 mg at bedtime.  HLD, known values: Continue rosuvastatin  5 mg at bedtime.  Goal LDL less than 70.  AAA s/p EVAR in 2021: Not on aspirin  due to Eliquis  use, continue rosuvastatin  5 mg nightly.  BP goal less than 150/90 mmHg.  Chronic A-fib s/p Biotronik PPM: Follow-up with EP for device interrogations.  Continue rate control with metoprolol  succinate 25 mg once daily and Eliquis  2.5 mg twice daily.  Fibromuscular dysplasia s/p R renal artery stenting in 1999: No intervention.   Medication Adjustments/Labs and Tests Ordered: Current medicines are reviewed at length with the patient today.  Concerns regarding medicines are outlined above.   Tests Ordered: No orders of the defined types were placed in this encounter.   Medication Changes: No orders of the defined types were placed in this encounter.   Disposition:  Follow up 3 months  Signed Vannah Nadal Priya Zeinab Rodwell, MD, 09/30/2023 11:15 AM    [facility] [address] [place], [place], [place] [address]

## 2023-09-30 NOTE — Patient Instructions (Addendum)
 Medication Instructions:  Your physician recommends that you continue on your current medications as directed. Please refer to the Current Medication list given to you today.   Labwork: BNP to be completed today [facility]  Testing/Procedures: Your physician has requested that you have an echocardiogram. Echocardiography is a painless test that uses sound waves to create images of your heart. It provides your doctor with information about the size and shape of your heart and how well your heart's chambers and valves are working. This procedure takes approximately one hour. There are no restrictions for this procedure. Please do NOT wear cologne, perfume, aftershave, or lotions (deodorant is allowed). Please arrive 15 minutes prior to your appointment time.  Please note: We ask at that you not bring children with you during ultrasound (echo/ vascular) testing. Due to room size and safety concerns, children are not allowed in the ultrasound rooms during exams. Our front office staff cannot provide observation of children in our lobby area while testing is being conducted. An adult accompanying a patient to their appointment will only be allowed in the ultrasound room at the discretion of the ultrasound technician under special circumstances. We apologize for any inconvenience.   Follow-Up: Your physician recommends that you schedule a follow-up appointment in: 3 months  Any Other Special Instructions Will Be Listed Below (If Applicable).  If you need a refill on your cardiac medications before your next appointment, please call your pharmacy.

## 2023-10-01 ENCOUNTER — Ambulatory Visit: Attending: Internal Medicine

## 2023-10-01 DIAGNOSIS — I509 Heart failure, unspecified: Secondary | ICD-10-CM

## 2023-10-02 ENCOUNTER — Encounter: Payer: Self-pay | Admitting: Internal Medicine

## 2023-10-02 LAB — ECHOCARDIOGRAM COMPLETE
AR max vel: 1.85 cm2
AV Area VTI: 1.68 cm2
AV Area mean vel: 1.86 cm2
AV Mean grad: 3 mmHg
AV Peak grad: 6.1 mmHg
AV Vena cont: 0.6 cm
Ao pk vel: 1.23 m/s
Calc EF: 54.6 %
MV VTI: 1.89 cm2
P 1/2 time: 534 ms
S' Lateral: 2.8 cm
Single Plane A2C EF: 50.8 %
Single Plane A4C EF: 57.6 %

## 2023-10-05 DIAGNOSIS — R2681 Unsteadiness on feet: Secondary | ICD-10-CM | POA: Diagnosis not present

## 2023-10-05 DIAGNOSIS — M6281 Muscle weakness (generalized): Secondary | ICD-10-CM | POA: Diagnosis not present

## 2023-10-06 ENCOUNTER — Ambulatory Visit: Payer: Self-pay | Admitting: Internal Medicine

## 2023-10-06 DIAGNOSIS — I509 Heart failure, unspecified: Secondary | ICD-10-CM

## 2023-10-06 DIAGNOSIS — H16221 Keratoconjunctivitis sicca, not specified as Sjogren's, right eye: Secondary | ICD-10-CM | POA: Diagnosis not present

## 2023-10-06 DIAGNOSIS — H04121 Dry eye syndrome of right lacrimal gland: Secondary | ICD-10-CM | POA: Diagnosis not present

## 2023-10-06 DIAGNOSIS — Z79899 Other long term (current) drug therapy: Secondary | ICD-10-CM

## 2023-10-07 DIAGNOSIS — R2681 Unsteadiness on feet: Secondary | ICD-10-CM | POA: Diagnosis not present

## 2023-10-07 DIAGNOSIS — M6281 Muscle weakness (generalized): Secondary | ICD-10-CM | POA: Diagnosis not present

## 2023-10-10 ENCOUNTER — Emergency Department (HOSPITAL_COMMUNITY)
Admission: EM | Admit: 2023-10-10 | Discharge: 2023-10-10 | Disposition: A | Discharge status: Home / Self Care | Attending: Emergency Medicine | Admitting: Emergency Medicine

## 2023-10-10 ENCOUNTER — Other Ambulatory Visit: Payer: Self-pay

## 2023-10-10 ENCOUNTER — Encounter (HOSPITAL_COMMUNITY): Payer: Self-pay | Admitting: Emergency Medicine

## 2023-10-10 ENCOUNTER — Emergency Department (HOSPITAL_COMMUNITY)

## 2023-10-10 DIAGNOSIS — K5900 Constipation, unspecified: Secondary | ICD-10-CM | POA: Diagnosis not present

## 2023-10-10 DIAGNOSIS — Z7901 Long term (current) use of anticoagulants: Secondary | ICD-10-CM | POA: Diagnosis not present

## 2023-10-10 DIAGNOSIS — R1084 Generalized abdominal pain: Secondary | ICD-10-CM | POA: Diagnosis present

## 2023-10-10 DIAGNOSIS — R918 Other nonspecific abnormal finding of lung field: Secondary | ICD-10-CM | POA: Diagnosis not present

## 2023-10-10 DIAGNOSIS — R109 Unspecified abdominal pain: Secondary | ICD-10-CM | POA: Diagnosis not present

## 2023-10-10 DIAGNOSIS — R6889 Other general symptoms and signs: Secondary | ICD-10-CM | POA: Diagnosis not present

## 2023-10-10 DIAGNOSIS — Z743 Need for continuous supervision: Secondary | ICD-10-CM | POA: Diagnosis not present

## 2023-10-10 MED ORDER — LINACLOTIDE 72 MCG PO CAPS: 72.0000 ug | ORAL_CAPSULE | Freq: Every day | ORAL | 1 refills | Status: AC

## 2023-10-10 NOTE — ED Triage Notes (Addendum)
 Pt bib [facility] from home. Pt c/o of constipation on and off x1 month & mid abd pain

## 2023-10-10 NOTE — ED Notes (Signed)
 Pts Son given an update

## 2023-10-10 NOTE — ED Provider Notes (Signed)
  EMERGENCY DEPARTMENT AT [facility] Provider Note   CSN: [id number] Arrival date & time: 10/10/23  9242     Patient presents with: Abdominal Pain   Karina [name] is a 89 y.o. female.  {Add pertinent medical, surgical, social history, OB history to YEP:67052} Patient has a history of constipation.  She has been out of her Linzess    Abdominal Pain      Prior to Admission medications   Medication Sig Start Date End Date Taking? Authorizing Provider  linaclotide  (LINZESS ) 72 MCG capsule Take 1 capsule (72 mcg total) by mouth daily before breakfast. 10/10/23  Yes Suzette Pac, MD  apixaban  (ELIQUIS ) 2.5 MG TABS tablet Take 1 tablet (2.5 mg total) 2 (two) times daily by mouth. 02/23/17   Rennie Toribio CROME, MD  cloNIDine  (CATAPRES ) 0.2 MG tablet Take 0.2 mg by mouth 3 (three) times daily.    [provider]  diltiazem  (TIAZAC ) 120 MG 24 hr capsule Take 120 mg by mouth daily. 12/12/21   [provider]  fluorometholone (FML) 0.1 % ophthalmic suspension Place 1 drop into both eyes 2 (two) times daily. 03/11/23   [provider]  FT STOOL SOFTENER 100 MG capsule Take 100 mg by mouth 2 (two) times daily. 06/11/23   [provider]  furosemide  (LASIX ) 20 MG tablet TAKE 1 TABLET BY MOUTH DAILY. MAY TAKE 1 extra TABLET AS NEEDED FOR SWELLING OR SHORTNESS OF BREATH 07/27/23   Mallipeddi, Vishnu P, MD  metoprolol  succinate (TOPROL -XL) 25 MG 24 hr tablet Take 25 mg by mouth 2 (two) times daily. 04/17/21   [provider]  MUCINEX 600 MG 12 hr tablet Take 600 mg by mouth 2 (two) times daily. 06/11/23   [provider]  polyethylene glycol powder (GLYCOLAX /MIRALAX ) 17 GM/SCOOP powder Take one capful mixed in 6 ounces of liquid three times daily until soft stool. Then continue one capful daily. Patient not taking: Reported on 09/30/2023 04/21/23   Ezzard Sonny RAMAN, PA-C  traZODone (DESYREL) 50 MG tablet Take 50 mg by mouth daily. Patient  not taking: Reported on 09/30/2023 07/23/23   [provider]  valsartan  (DIOVAN ) 80 MG tablet Take 80 mg by mouth daily.    [provider]    Allergies: Ace inhibitors, Pacerone [amiodarone], Tikosyn [dofetilide], and Meperidine  and related    Review of Systems  Gastrointestinal:  Positive for abdominal pain.    Updated Vital Signs BP (!) 156/95   Pulse 76   Temp 97.9 F (36.6 C) (Oral)   Ht 5' 2 (1.575 m)   Wt 52 kg   SpO2 96%   BMI 20.97 kg/m   Physical Exam  (all labs ordered are listed, but only abnormal results are displayed) Labs Reviewed - No data to display  EKG: None  Radiology: DG ABD ACUTE 2+V W 1V CHEST Result Date: 10/10/2023 CLINICAL DATA:  Constipation.  Abdominal pain. EXAM: DG ABDOMEN ACUTE WITH 1 VIEW CHEST COMPARISON:  CT 06/27/2023 FINDINGS: There is a moderate volume of retained stool identified within the cecum and ascending colon. Desiccated stool noted within the rectum. No dilated loops of large or small bowel. Signs of previous aortic stent graft repair. Heart size is enlarged. There is a left chest wall pacer device with lead in the right ventricle. Aortic atherosclerosis. Blunting of the left costophrenic angle is identified which may reflect pleuroparenchymal scarring or small effusion. No interstitial edema or airspace consolidation. Previous lumbar hardware fixation and right hip arthroplasty. IMPRESSION: 1.  Moderate volume of retained stool within the cecum and ascending colon. 2. Desiccated stool within the rectum. 3. Cardiomegaly. 4. Blunting of the left costophrenic angle may reflect pleuroparenchymal scarring or small effusion. Electronically Signed   By: Waddell Calk M.D.   On: 10/10/2023 09:15    {Document cardiac monitor, telemetry assessment procedure when appropriate:32947} Procedures   Medications Ordered in the ED - No data to display    {Click here for ABCD2, HEART and other calculators REFRESH Note before  signing:1}                              Medical Decision Making Amount and/or Complexity of Data Reviewed Radiology: ordered.  Risk Prescription drug management.   Patient with constipation.  She will get another prescription of Linzess  and follow-up with her GI doctor  {Document critical care time when appropriate  Document review of labs and clinical decision tools ie CHADS2VASC2, etc  Document your independent review of radiology images and any outside records  Document your discussion with family members, caretakers and with consultants  Document social determinants of health affecting pt's care  Document your decision making why or why not admission, treatments were needed:32947:::1}   Final diagnoses:  Constipation, unspecified constipation type    ED Discharge Orders          Ordered    linaclotide  (LINZESS ) 72 MCG capsule  Daily before breakfast        10/10/23 1007

## 2023-10-10 NOTE — Discharge Instructions (Signed)
 Drink plenty of fluids.  Start taking back your Linzess  and follow-up with your GI doctor

## 2023-10-12 ENCOUNTER — Telehealth: Payer: Self-pay | Admitting: Internal Medicine

## 2023-10-12 MED ORDER — FUROSEMIDE 20 MG PO TABS: 40.0000 mg | ORAL_TABLET | Freq: Every day | ORAL | 5 refills | Status: AC

## 2023-10-12 NOTE — Telephone Encounter (Signed)
-----   Message from Vishnu P Mallipeddi sent at 10/06/2023 11:02 AM EDT ----- proBNP is significantly elevated.  Increase p.o. Lasix  from 20 mg to 40 mg once daily.  Obtain BMP in 5 days to make sure serum potassium is normal. ----- Message ----- From: Delbra Krebs T Sent: 10/01/2023   4:06 PM EDT To: Vishnu P Mallipeddi, MD

## 2023-10-12 NOTE — Telephone Encounter (Signed)
 Son called in stated he was returning a call from last week about changes in medication and some additional test the provider wanted to do.    Best number [phone] or [phone]

## 2023-10-12 NOTE — Telephone Encounter (Signed)
 Patient notified of result.  Please refer to phone note from today for complete details.   Littie CHRISTELLA Croak, CMA 10/12/2023 5:38 PM

## 2023-10-12 NOTE — Telephone Encounter (Signed)
 The patient has been notified of the result and verbalized understanding.  All questions (if any) were answered. Advised son will mail lab order to his mom to have completed on or after 07/14 at [facility]. Also increased Lasix  to 40 mg daily. Verbalized understanding  Littie CHRISTELLA Croak, CMA 10/12/2023 5:34 PM

## 2023-10-19 DIAGNOSIS — I509 Heart failure, unspecified: Secondary | ICD-10-CM | POA: Diagnosis not present

## 2023-10-19 DIAGNOSIS — I10 Essential (primary) hypertension: Secondary | ICD-10-CM | POA: Diagnosis not present

## 2023-10-19 DIAGNOSIS — M6281 Muscle weakness (generalized): Secondary | ICD-10-CM | POA: Diagnosis not present

## 2023-10-21 DIAGNOSIS — I509 Heart failure, unspecified: Secondary | ICD-10-CM | POA: Diagnosis not present

## 2023-10-21 DIAGNOSIS — M6281 Muscle weakness (generalized): Secondary | ICD-10-CM | POA: Diagnosis not present

## 2023-10-21 DIAGNOSIS — R2681 Unsteadiness on feet: Secondary | ICD-10-CM | POA: Diagnosis not present

## 2023-11-08 ENCOUNTER — Emergency Department (HOSPITAL_COMMUNITY)
Admission: EM | Admit: 2023-11-08 | Discharge: 2023-11-08 | Disposition: A | Discharge status: Home / Self Care | Attending: Emergency Medicine | Admitting: Emergency Medicine

## 2023-11-08 ENCOUNTER — Encounter (HOSPITAL_COMMUNITY): Payer: Self-pay

## 2023-11-08 ENCOUNTER — Other Ambulatory Visit: Payer: Self-pay

## 2023-11-08 DIAGNOSIS — R6889 Other general symptoms and signs: Secondary | ICD-10-CM | POA: Diagnosis not present

## 2023-11-08 DIAGNOSIS — Z950 Presence of cardiac pacemaker: Secondary | ICD-10-CM | POA: Insufficient documentation

## 2023-11-08 DIAGNOSIS — I499 Cardiac arrhythmia, unspecified: Secondary | ICD-10-CM | POA: Diagnosis not present

## 2023-11-08 DIAGNOSIS — Z743 Need for continuous supervision: Secondary | ICD-10-CM | POA: Diagnosis not present

## 2023-11-08 DIAGNOSIS — Z79899 Other long term (current) drug therapy: Secondary | ICD-10-CM | POA: Insufficient documentation

## 2023-11-08 DIAGNOSIS — I509 Heart failure, unspecified: Secondary | ICD-10-CM | POA: Diagnosis not present

## 2023-11-08 DIAGNOSIS — I110 Hypertensive heart disease with heart failure: Secondary | ICD-10-CM | POA: Diagnosis not present

## 2023-11-08 DIAGNOSIS — I2510 Atherosclerotic heart disease of native coronary artery without angina pectoris: Secondary | ICD-10-CM | POA: Insufficient documentation

## 2023-11-08 DIAGNOSIS — Z7901 Long term (current) use of anticoagulants: Secondary | ICD-10-CM | POA: Insufficient documentation

## 2023-11-08 DIAGNOSIS — R040 Epistaxis: Secondary | ICD-10-CM | POA: Insufficient documentation

## 2023-11-08 DIAGNOSIS — R58 Hemorrhage, not elsewhere classified: Secondary | ICD-10-CM | POA: Diagnosis not present

## 2023-11-08 LAB — COMPREHENSIVE METABOLIC PANEL WITH GFR
ALT: 13 U/L (ref 0–44)
AST: 19 U/L (ref 15–41)
Albumin: 3.6 g/dL (ref 3.5–5.0)
Alkaline Phosphatase: 73 U/L (ref 38–126)
Anion gap: 14 (ref 5–15)
BUN: 37 mg/dL — ABNORMAL HIGH (ref 8–23)
CO2: 26 mmol/L (ref 22–32)
Calcium: 9.4 mg/dL (ref 8.9–10.3)
Chloride: 94 mmol/L — ABNORMAL LOW (ref 98–111)
Creatinine, Ser: 0.77 mg/dL (ref 0.44–1.00)
GFR, Estimated: >60 mL/min (ref >60)
Glucose, Bld: 144 mg/dL — ABNORMAL HIGH (ref 70–99)
Potassium: 3.3 mmol/L — ABNORMAL LOW (ref 3.5–5.1)
Sodium: 134 mmol/L — ABNORMAL LOW (ref 135–145)
Total Bilirubin: 1.3 mg/dL — ABNORMAL HIGH (ref 0.0–1.2)
Total Protein: 6.6 g/dL (ref 6.5–8.1)

## 2023-11-08 LAB — CBC WITH DIFFERENTIAL/PLATELET
Abs Immature Granulocytes: 0.12 K/uL — ABNORMAL HIGH (ref 0.00–0.07)
Basophils Absolute: 0.1 K/uL (ref 0.0–0.1)
Basophils Relative: 1 %
Eosinophils Absolute: 0.1 K/uL (ref 0.0–0.5)
Eosinophils Relative: 1 %
HCT: 36 % (ref 36.0–46.0)
Hemoglobin: 12 g/dL (ref 12.0–15.0)
Immature Granulocytes: 1 %
Lymphocytes Relative: 8 %
Lymphs Abs: 0.9 K/uL (ref 0.7–4.0)
MCH: 29.9 pg (ref 26.0–34.0)
MCHC: 33.3 g/dL (ref 30.0–36.0)
MCV: 89.8 fL (ref 80.0–100.0)
Monocytes Absolute: 0.9 K/uL (ref 0.1–1.0)
Monocytes Relative: 8 %
Neutro Abs: 9.4 K/uL — ABNORMAL HIGH (ref 1.7–7.7)
Neutrophils Relative %: 81 %
Platelets: 296 K/uL (ref 150–400)
RBC: 4.01 MIL/uL (ref 3.87–5.11)
RDW: 12.8 % (ref 11.5–15.5)
WBC: 11.4 K/uL — ABNORMAL HIGH (ref 4.0–10.5)
nRBC: 0 % (ref 0.0–0.2)

## 2023-11-08 LAB — PROTIME-INR
INR: 1.4 — ABNORMAL HIGH (ref 0.8–1.2)
Prothrombin Time: 17.7 s — ABNORMAL HIGH (ref 11.4–15.2)

## 2023-11-08 LAB — MAGNESIUM: Magnesium: 1.9 mg/dL (ref 1.7–2.4)

## 2023-11-08 MED ORDER — POTASSIUM CHLORIDE 20 MEQ PO PACK
40.0000 meq | PACK | Freq: Once | ORAL | Status: AC
  Administered 2023-11-08: 40 meq via ORAL
  Filled 2023-11-08: qty 2

## 2023-11-08 NOTE — ED Triage Notes (Addendum)
 Patient BIB [facility] From home for complaint of a nosebleed that produced some clotts started at 2am went to [facility] waited 4hrs and did not see a provider and left ama. EMS initial BP 171/114, Hx of afib, CHF, HTN. EMS second BP 130/80.

## 2023-11-08 NOTE — ED Notes (Signed)
Patient wearing hearing aids

## 2023-11-08 NOTE — Discharge Instructions (Signed)
 Hold your Eliquis  for the next 24 hours.  Resume taking after that.  Avoid nose picking.  Call the telephone number below to follow-up with a ear, nose, and throat doctor.  Return to the emergency department for any return of concerning symptoms.

## 2023-11-08 NOTE — ED Notes (Signed)
 ED Provider at bedside.

## 2023-11-08 NOTE — ED Provider Notes (Signed)
 [place] EMERGENCY DEPARTMENT AT [facility] Provider Note   CSN: [id number] Arrival date & time: 11/08/23  1043     Patient presents with: Epistaxis   Karina [name] is a 89 y.o. female.   HPI Patient presents for epistaxis.  Medical history includes HTN, HLD, atrial fibrillation, CAD, PAD, pacemaker placement, prediabetes, CHF.  She is prescribed Eliquis .  At around 2 AM, she had onset of left-sided epistaxis.  She went to [facility], where she waited for 4 hours without being seen.  During that time, nosebleed resolved.  She left AMA.  After she got home, she had recurrence of nosebleed which has also now resolved.  She denies any recent trauma.  Last dose of Eliquis  was this morning.    Prior to Admission medications   Medication Sig Start Date End Date Taking? Authorizing Provider  apixaban  (ELIQUIS ) 2.5 MG TABS tablet Take 1 tablet (2.5 mg total) 2 (two) times daily by mouth. 02/23/17   Rennie Toribio CROME, MD  cloNIDine  (CATAPRES ) 0.2 MG tablet Take 0.2 mg by mouth 3 (three) times daily.    [provider]  diltiazem  (TIAZAC ) 120 MG 24 hr capsule Take 120 mg by mouth daily. 12/12/21   [provider]  fluorometholone (FML) 0.1 % ophthalmic suspension Place 1 drop into both eyes 2 (two) times daily. 03/11/23   [provider]  FT STOOL SOFTENER 100 MG capsule Take 100 mg by mouth 2 (two) times daily. 06/11/23   [provider]  furosemide  (LASIX ) 20 MG tablet Take 2 tablets (40 mg total) by mouth daily. 10/12/23   Mallipeddi, Vishnu P, MD  linaclotide  (LINZESS ) 72 MCG capsule Take 1 capsule (72 mcg total) by mouth daily before breakfast. 10/10/23   Suzette Pac, MD  metoprolol  succinate (TOPROL -XL) 25 MG 24 hr tablet Take 25 mg by mouth 2 (two) times daily. 04/17/21   [provider]  MUCINEX 600 MG 12 hr tablet Take 600 mg by mouth 2 (two) times daily. 06/11/23   [provider]  polyethylene glycol powder (GLYCOLAX /MIRALAX )  17 GM/SCOOP powder Take one capful mixed in 6 ounces of liquid three times daily until soft stool. Then continue one capful daily. Patient not taking: Reported on 09/30/2023 04/21/23   Ezzard Sonny RAMAN, PA-C  traZODone (DESYREL) 50 MG tablet Take 50 mg by mouth daily. Patient not taking: Reported on 09/30/2023 07/23/23   [provider]  valsartan  (DIOVAN ) 80 MG tablet Take 80 mg by mouth daily.    [provider]    Allergies: Ace inhibitors, Pacerone [amiodarone], Tikosyn [dofetilide], and Meperidine  and related    Review of Systems  HENT:  Positive for nosebleeds.   All other systems reviewed and are negative.   Updated Vital Signs BP 135/82   Pulse 78   Temp 98 F (36.7 C) (Oral)   Resp 14   Ht 5' 2 (1.575 m)   Wt 49.9 kg   SpO2 95%   BMI 20.12 kg/m   Physical Exam Vitals and nursing note reviewed.  Constitutional:      General: She is not in acute distress.    Appearance: Normal appearance. She is well-developed. She is not ill-appearing, toxic-appearing or diaphoretic.  HENT:     Head: Normocephalic and atraumatic.     Right Ear: External ear normal.     Left Ear: External ear normal.     Nose:     Comments: Dried blood in left nares.  No active bleeding.  Mouth/Throat:     Mouth: Mucous membranes are moist.  Eyes:     Extraocular Movements: Extraocular movements intact.     Conjunctiva/sclera: Conjunctivae normal.  Cardiovascular:     Rate and Rhythm: Normal rate and regular rhythm.  Pulmonary:     Effort: Pulmonary effort is normal. No respiratory distress.  Abdominal:     General: There is no distension.     Palpations: Abdomen is soft.  Musculoskeletal:        General: No swelling. Normal range of motion.     Cervical back: Normal range of motion and neck supple.  Skin:    General: Skin is warm and dry.     Coloration: Skin is not jaundiced or pale.  Neurological:     General: No focal deficit present.     Mental Status: She is alert  and oriented to person, place, and time.  Psychiatric:        Mood and Affect: Mood normal.        Behavior: Behavior normal.     (all labs ordered are listed, but only abnormal results are displayed) Labs Reviewed  CBC WITH DIFFERENTIAL/PLATELET - Abnormal; Notable for the following components:      Result Value   WBC 11.4 (*)    Neutro Abs 9.4 (*)    Abs Immature Granulocytes 0.12 (*)    All other components within normal limits  PROTIME-INR - Abnormal; Notable for the following components:   Prothrombin Time 17.7 (*)    INR 1.4 (*)    All other components within normal limits  COMPREHENSIVE METABOLIC PANEL WITH GFR - Abnormal; Notable for the following components:   Sodium 134 (*)    Potassium 3.3 (*)    Chloride 94 (*)    Glucose, Bld 144 (*)    BUN 37 (*)    Total Bilirubin 1.3 (*)    All other components within normal limits  MAGNESIUM     EKG: None  Radiology: No results found.   Procedures   Medications Ordered in the ED  potassium chloride  (KLOR-CON ) packet 40 mEq (40 mEq Oral Given 11/08/23 1237)                                    Medical Decision Making Amount and/or Complexity of Data Reviewed Labs: ordered.  Risk Prescription drug management.   Patient presents for epistaxis, now resolved.  Onset was 2 AM.  She initially went to [facility] ER but left AMA after bleeding had stopped.  She had recurrence while at home this morning.  On arrival in the ED, vital signs notable for moderate hypertension.  Patient is well-appearing on exam.  She has dried blood in left nares.  Given what she describes as prolonged bleeding, will check some lab work and monitor patient for recurrence.  Lab work shows normal hemoglobin.  Potassium is slightly low but other electrolytes are normal.  Replacement potassium was ordered.  Patient had no recurrence of epistaxis while in the ED. she was discharged in stable condition.     Final diagnoses:  Epistaxis    ED Discharge  Orders     None          Melvenia Motto, MD 11/08/23 1329

## 2023-11-19 ENCOUNTER — Encounter: Payer: Self-pay | Admitting: Internal Medicine

## 2023-11-25 DIAGNOSIS — R634 Abnormal weight loss: Secondary | ICD-10-CM | POA: Diagnosis not present

## 2023-11-25 DIAGNOSIS — I509 Heart failure, unspecified: Secondary | ICD-10-CM | POA: Diagnosis not present

## 2023-11-25 DIAGNOSIS — M6281 Muscle weakness (generalized): Secondary | ICD-10-CM | POA: Diagnosis not present

## 2023-11-25 DIAGNOSIS — I10 Essential (primary) hypertension: Secondary | ICD-10-CM | POA: Diagnosis not present

## 2023-11-26 DIAGNOSIS — I10 Essential (primary) hypertension: Secondary | ICD-10-CM | POA: Diagnosis not present

## 2023-11-26 DIAGNOSIS — N183 Chronic kidney disease, stage 3 unspecified: Secondary | ICD-10-CM | POA: Diagnosis not present

## 2023-11-26 DIAGNOSIS — Z0000 Encounter for general adult medical examination without abnormal findings: Secondary | ICD-10-CM | POA: Diagnosis not present

## 2023-11-26 DIAGNOSIS — Z299 Encounter for prophylactic measures, unspecified: Secondary | ICD-10-CM | POA: Diagnosis not present

## 2023-11-26 DIAGNOSIS — I4891 Unspecified atrial fibrillation: Secondary | ICD-10-CM | POA: Diagnosis not present

## 2023-11-26 DIAGNOSIS — I509 Heart failure, unspecified: Secondary | ICD-10-CM | POA: Diagnosis not present

## 2023-11-26 DIAGNOSIS — Z1389 Encounter for screening for other disorder: Secondary | ICD-10-CM | POA: Diagnosis not present

## 2023-11-26 DIAGNOSIS — Z7189 Other specified counseling: Secondary | ICD-10-CM | POA: Diagnosis not present

## 2023-12-17 DIAGNOSIS — R2681 Unsteadiness on feet: Secondary | ICD-10-CM | POA: Diagnosis not present

## 2023-12-17 DIAGNOSIS — I10 Essential (primary) hypertension: Secondary | ICD-10-CM | POA: Diagnosis not present

## 2023-12-17 DIAGNOSIS — Z299 Encounter for prophylactic measures, unspecified: Secondary | ICD-10-CM | POA: Diagnosis not present

## 2023-12-17 DIAGNOSIS — I509 Heart failure, unspecified: Secondary | ICD-10-CM | POA: Diagnosis not present

## 2023-12-17 DIAGNOSIS — K5909 Other constipation: Secondary | ICD-10-CM | POA: Diagnosis not present

## 2023-12-17 DIAGNOSIS — R040 Epistaxis: Secondary | ICD-10-CM | POA: Diagnosis not present

## 2023-12-18 ENCOUNTER — Encounter: Payer: Self-pay | Admitting: Internal Medicine

## 2023-12-28 DIAGNOSIS — M25569 Pain in unspecified knee: Secondary | ICD-10-CM | POA: Diagnosis not present

## 2023-12-28 DIAGNOSIS — R2681 Unsteadiness on feet: Secondary | ICD-10-CM | POA: Diagnosis not present

## 2023-12-28 DIAGNOSIS — M6281 Muscle weakness (generalized): Secondary | ICD-10-CM | POA: Diagnosis not present

## 2023-12-29 ENCOUNTER — Ambulatory Visit: Attending: Internal Medicine | Admitting: Internal Medicine

## 2023-12-29 ENCOUNTER — Encounter: Payer: Self-pay | Admitting: Internal Medicine

## 2023-12-29 VITALS — BP 114/78 | HR 78 | Ht 61.0 in | Wt 112.4 lb

## 2023-12-29 DIAGNOSIS — I5032 Chronic diastolic (congestive) heart failure: Secondary | ICD-10-CM | POA: Diagnosis not present

## 2023-12-29 DIAGNOSIS — W57XXXA Bitten or stung by nonvenomous insect and other nonvenomous arthropods, initial encounter: Secondary | ICD-10-CM | POA: Diagnosis not present

## 2023-12-29 DIAGNOSIS — R52 Pain, unspecified: Secondary | ICD-10-CM | POA: Diagnosis not present

## 2023-12-29 DIAGNOSIS — I351 Nonrheumatic aortic (valve) insufficiency: Secondary | ICD-10-CM | POA: Insufficient documentation

## 2023-12-29 DIAGNOSIS — Z299 Encounter for prophylactic measures, unspecified: Secondary | ICD-10-CM | POA: Diagnosis not present

## 2023-12-29 DIAGNOSIS — R040 Epistaxis: Secondary | ICD-10-CM | POA: Diagnosis not present

## 2023-12-29 NOTE — Patient Instructions (Signed)
 Medication Instructions:  Your physician recommends that you continue on your current medications as directed. Please refer to the Current Medication list given to you today.   Labwork: None  Testing/Procedures: None  Follow-Up: Your physician recommends that you schedule a follow-up appointment in: 4 months  Any Other Special Instructions Will Be Listed Below (If Applicable). Thank you for choosing [facility]!     If you need a refill on your cardiac medications before your next appointment, please call your pharmacy.

## 2023-12-29 NOTE — Progress Notes (Signed)
 Cardiology Office Note  Date: 12/29/2023   ID: Clarine [name], DOB 24-Aug-1922, MRN [id number]  PCP:  Maree Isles, MD  Cardiologist:  Diannah SHAUNNA Maywood, MD Electrophysiologist:  Eulas FORBES Furbish, MD   Reason for Office Visit: Follow-up of ADHF   History of Present Illness: Karina [name] is a 89 y.o. female known to have chronic A-fib s/p PPM, CAD s/p LAD PCI, fibromuscular dysplasia s/p right renal artery stenting in 1999, AAA status post EVAR, poorly controlled HTN, prediabetes, history of TIA, peripheral neuropathy presented to cardiology clinic for follow-up visit. She was previously followed by [facility] cardiology and wanted to follow-up with cardiology closer to her home.  Accompanied by caretaker.  In the previous clinic visit, she was accompanied by son who reported that she had productive cough, choking spells at night.  She also had 1+ pitting edema at the time.  proBNP was obtained that was significantly elevated.  She was already on p.o. Lasix  20 mg once daily that was increased to p.o. Lasix  40 mg once daily.  She is here today for follow-up visit.  Doing much better.  Leg swelling completely resolved.  She has cough occasionally but not as much as before.  Choking spells also resolved.  No SOB.  No angina.  Dizziness few times per month.  No syncope.    Past Medical History:  Diagnosis Date   Actinic keratosis    Bilateral leg weakness 09/27/12   CAD (coronary artery disease)    EF 55% by 2 D Echo 2008 and 2012   Chronic atrial fibrillation (HCC)    Chronic edema 10/15/11   Chronic fatigue    DDD (degenerative disc disease)    Encounter for long-term (current) use of other medications 09/02/12   Endometrial carcinoma (HCC)    Fibromuscular dysplasia    S/P PCI right renal aretery 1999   GERD (gastroesophageal reflux disease)    Hip fracture, right (HCC) 08/06/06   S/P surgery   Hx of cardiovascular stress test 09/2010   Nuclear stress testing showing no evidence of  ischemia, EF=76%, No EKG changes & no perfusion defects.   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   LVH (left ventricular hypertrophy)    Mild   Osteoarthritis    Osteoporosis    With Hx of thoracic compression fractures   Pacemaker    Paroxysmal atrial fibrillation (HCC)    Peripheral neuropathy    Peripheral vascular disease    Pre-diabetes 01/21/11   Chronic   Renovascular hypertension    Tachy-brady syndrome (HCC)    Thoracic compression fracture (HCC)    TIA (transient ischemic attack) 08/2011   OSH: flushing, left LE numbness, CT negative   Tic douloureux 1994   S/P successful surgery   Trigeminal neuralgia of right side of face     Past Surgical History:  Procedure Laterality Date   ABDOMINAL AORTAGRAM  09/05/01   Right & left renals 25%   APPENDECTOMY  1946   CARDIAC CATHETERIZATION  11/05/01   EF 72%. RCA 25%. LCX 50%. Ramus 75/100%. LAD 95%. D2 75%.   CESAREAN SECTION  1959   CESAREAN SECTION  1961   CHOLECYSTECTOMY  1989   CORONARY ANGIOPLASTY     CORONARY ANGIOPLASTY WITH STENT PLACEMENT  11/05/01   PTCA/Stent to 95% mid  LAD, Ramus lesion could not be crossed and was not treated.    DIPYRIDAMOLE STRESS TEST  09/2010   Nuclear stress testing showing no evidence of ischemia, EF=76%, No EKG changes & no perfusion defects.   LUMBAR DISC SURGERY     ORIF FEMUR FRACTURE Right 02/16/2017   Procedure: OPEN REDUCTION INTERNAL FIXATION (ORIF) DISTAL FEMUR FRACTURE;  Surgeon: Kendal Franky SQUIBB, MD;  Location: [facility] OR;  Service: Orthopedics;  Laterality: Right;   RENAL ANGIOGRAM  09/14/97   25% diffuse left renal artery. 50% ostia; right renal artery.   RENAL ARTERY ANGIOPLASTY Right 1994   Renal artery stenosis secondary to fibromusclar dysplasia   RENAL ARTERY ANGIOPLASTY Right 09/13/97   PTA/Stent to ostial right renal artery, No  distal fibromuscular hyperplasia   SPINAL FUSION  2002   Percutaneous spinal fusion   TOTAL ABDOMINAL HYSTERECTOMY W/ BILATERAL SALPINGOOPHORECTOMY  1990   VAGINAL HYSTERECTOMY  1990    Current Outpatient Medications  Medication Sig Dispense Refill   apixaban  (ELIQUIS ) 2.5 MG TABS tablet Take 1 tablet (2.5 mg total) 2 (two) times daily by mouth. 60 tablet 1   cloNIDine  (CATAPRES ) 0.2 MG tablet Take 0.2 mg by mouth 3 (three) times daily.     diltiazem  (TIAZAC ) 120 MG 24 hr capsule Take 120 mg by mouth daily.     fluorometholone (FML) 0.1 % ophthalmic suspension Place 1 drop into both eyes 2 (two) times daily.     FT STOOL SOFTENER 100 MG capsule Take 100 mg by mouth 2 (two) times daily.     furosemide  (LASIX ) 20 MG tablet Take 2 tablets (40 mg total) by mouth daily. 30 tablet 5   linaclotide  (LINZESS ) 72 MCG capsule Take 1 capsule (72 mcg total) by mouth daily before breakfast. 30 capsule 1   metoprolol  succinate (TOPROL -XL) 25 MG 24 hr tablet Take 25 mg by mouth 2 (two) times daily.     MUCINEX 600 MG 12 hr tablet Take 600 mg by mouth 2 (two) times daily.     valsartan  (DIOVAN ) 80 MG tablet Take 80 mg by mouth daily.     No current facility-administered medications for this visit.   Allergies:  Ace inhibitors, Pacerone [amiodarone], Tikosyn [dofetilide], and Meperidine  and related   Social History: The patient  reports that she has never smoked. She has never been exposed to tobacco smoke. She has never used smokeless tobacco. She reports current alcohol  use. She reports that she does not use drugs.   Family History: The patient's family history includes COPD in her brother; Diabetes in her brother; Hypertension in her brother; Stroke in her father and another family member.   ROS:  Please see the history of present illness. Otherwise, complete review of systems is positive for none.  All other systems are reviewed and negative.   Physical Exam: VS:  Ht 5' 1 (1.549 m)   Wt 112 lb 6.4  oz (51 kg)   BMI 21.24 kg/m , BMI Body mass index is 21.24 kg/m.  Wt Readings from Last 3 Encounters:  12/29/23 112 lb 6.4 oz (51 kg)  11/08/23 110 lb (49.9 kg)  10/10/23 114 lb 10.2 oz (52 kg)    General: Patient appears comfortable at rest. HEENT: Conjunctiva and lids normal, oropharynx clear with moist mucosa. Neck: Supple, no elevated JVP or carotid bruits, no thyromegaly. Lungs: Clear to auscultation, nonlabored breathing at rest. Cardiac: Regular rate and rhythm, no S3 or significant systolic murmur, no pericardial rub. Abdomen: Soft, nontender, no  hepatomegaly, bowel sounds present, no guarding or rebound. Extremities: No pitting edema, distal pulses 2+. Skin: Warm and dry. Musculoskeletal: No kyphosis. Neuropsychiatric: Alert and oriented x3, affect grossly appropriate.  ECG: Ventricular paced rhythm, underlying atrial fibrillation rhythm  Recent Labwork: 11/08/2023: ALT 13; AST 19; BUN 37; Creatinine, Ser 0.77; Hemoglobin 12.0; Magnesium  1.9; Platelets 296; Potassium 3.3; Sodium 134  No results found for: CHOL, TRIG, HDL, CHOLHDL, VLDL, LDLCALC, LDLDIRECT  Other Studies Reviewed Today: Echocardiogram in 10/2021 LVEF normal RV systolic function normal Biatrial enlargement, severe Mild aortic valve regurgitation  Assessment and Plan:  Acute on chronic diastolic heart failure: Symptomatic previously with productive cough, choking spells at night, 1+ pitting edema and proBNP elevated, 2609.  Initially on p.o. Lasix  20 mg once daily that was increased to 40 mg once daily.  Compensated, continue p.o. Lasix  40 mg once daily. Echo from June 2025 showed normal LVEF, normal RV function, moderate LA dilatation, severe RA dilatation, mild MR, mild to moderate AR CVP 8 mmHg.  Mild to moderate AI in 2025: Asymptomatic.  Can repeat echocardiogram in 2026.  CAD a/p LAD PCI (remote history) and history of TIA: Not on aspirin  due to Eliquis  use, continue rosuvastatin  5 mg  nightly.  HLD, known values: Continue rosuvastatin  5 mg nightly.  Goal LDL less than 70.  AAA s/p EVAR in 2021: Not on aspirin  due to Eliquis  use, continue rosuvastatin  5 mg nightly.  BP goal less than 150 mmHg SBP.  Chronic A-fib s/p Biotronik PPM: Follow-up with EP for device interrogations.  Continue rate control with metoprolol  succinate 25 mg twice daily, Eliquis  2.5 mg twice daily.  Fibromuscular dysplasia s/p R renal artery stenting in 1999: No intervention.  HTN, controlled: Continue current antihypertensives, clonidine  0.2 mg 3 times daily, diltiazem  120 mg once daily, metoprolol  succinate 25 mg twice daily, valsartan  80 mg once daily and p.o. Lasix  40 mg once daily.   30 minutes spent in reviewing the prior records, imaging/test/labs/reports, discussing the above problems with the patient, documentation and answering all her questions.  Medication Adjustments/Labs and Tests Ordered: Current medicines are reviewed at length with the patient today.  Concerns regarding medicines are outlined above.   Tests Ordered: No orders of the defined types were placed in this encounter.   Medication Changes: No orders of the defined types were placed in this encounter.   Disposition:  Follow up 4 months  Signed Chala Gul Priya Lynford Espinoza, MD, 12/29/2023 11:26 AM    [facility] at [facility] [address] [place], [place], [place] [address]

## 2024-01-04 DIAGNOSIS — Z7409 Other reduced mobility: Secondary | ICD-10-CM | POA: Diagnosis not present

## 2024-01-04 DIAGNOSIS — M6281 Muscle weakness (generalized): Secondary | ICD-10-CM | POA: Diagnosis not present

## 2024-01-04 DIAGNOSIS — Z515 Encounter for palliative care: Secondary | ICD-10-CM | POA: Diagnosis not present

## 2024-01-04 DIAGNOSIS — I509 Heart failure, unspecified: Secondary | ICD-10-CM | POA: Diagnosis not present

## 2024-01-05 DIAGNOSIS — R040 Epistaxis: Secondary | ICD-10-CM | POA: Diagnosis not present

## 2024-01-05 DIAGNOSIS — Z7901 Long term (current) use of anticoagulants: Secondary | ICD-10-CM | POA: Diagnosis not present

## 2024-01-08 ENCOUNTER — Ambulatory Visit: Attending: Cardiovascular Disease | Admitting: Cardiovascular Disease

## 2024-01-08 ENCOUNTER — Encounter: Payer: Self-pay | Admitting: Cardiovascular Disease

## 2024-01-08 VITALS — BP 100/62 | HR 64 | Ht 61.5 in | Wt 110.0 lb

## 2024-01-08 DIAGNOSIS — I480 Paroxysmal atrial fibrillation: Secondary | ICD-10-CM | POA: Diagnosis not present

## 2024-01-08 DIAGNOSIS — I5043 Acute on chronic combined systolic (congestive) and diastolic (congestive) heart failure: Secondary | ICD-10-CM | POA: Diagnosis not present

## 2024-01-08 DIAGNOSIS — I495 Sick sinus syndrome: Secondary | ICD-10-CM | POA: Diagnosis not present

## 2024-01-08 LAB — CUP PACEART INCLINIC DEVICE CHECK
Date Time Interrogation Session: 20251003164556
Implantable Lead Connection Status: 753985
Implantable Lead Implant Date: 20150413
Implantable Lead Location: 753860
Implantable Lead Serial Number: 29601447
Implantable Pulse Generator Implant Date: 20150413
Pulse Gen Serial Number: 68133668

## 2024-01-08 NOTE — Patient Instructions (Signed)

## 2024-01-08 NOTE — Progress Notes (Signed)
      HPI Karina [name] returns today for followup. She is a pleasant 89 yo woman with a h/o chronic atrial fib, CAD s/p stenting of the LAD remotely. She is now on a strategy of rate control.   She has a Secondary school teacher implanted at [employer] in 2015. she has no device related complaints -- no new tenderness, drainage, redness.   BP 100/62   Pulse 64   Ht 5' 1.5 (1.562 m)   Wt 110 lb (49.9 kg)   SpO2 95%   BMI 20.45 kg/m   Physical Exam:  Gen: Appears comfortable, well-nourished CV: RRR, no dependent edema The device site is normal -- no tenderness, edema, drainage, redness, threatened erosion.  Pulm: breathing easily  DEVICE  Normal device function.  See PaceArt for details.   Assess/Plan:  1. Atrial fib - her VR is controlled.   - on eliquis  2.5 dosed appropriately. Labs August 2025 reviewed 2. CHB - she has an underlying rate in the low 40's today. She is asymptomatic,s/p PPM. 3. PPM -her Biotronik VVIR PPM is working normally. 4. CAD - she denies anginal symptoms.    Eulas FORBES Furbish, MD

## 2024-01-11 DIAGNOSIS — I509 Heart failure, unspecified: Secondary | ICD-10-CM | POA: Diagnosis not present

## 2024-01-11 DIAGNOSIS — I10 Essential (primary) hypertension: Secondary | ICD-10-CM | POA: Diagnosis not present

## 2024-01-11 DIAGNOSIS — Z299 Encounter for prophylactic measures, unspecified: Secondary | ICD-10-CM | POA: Diagnosis not present

## 2024-01-11 DIAGNOSIS — R040 Epistaxis: Secondary | ICD-10-CM | POA: Diagnosis not present

## 2024-01-13 DIAGNOSIS — R2681 Unsteadiness on feet: Secondary | ICD-10-CM | POA: Diagnosis not present

## 2024-01-13 DIAGNOSIS — M25569 Pain in unspecified knee: Secondary | ICD-10-CM | POA: Diagnosis not present

## 2024-01-13 DIAGNOSIS — M6281 Muscle weakness (generalized): Secondary | ICD-10-CM | POA: Diagnosis not present

## 2024-01-18 ENCOUNTER — Ambulatory Visit: Payer: Self-pay | Admitting: Cardiovascular Disease

## 2024-01-18 DIAGNOSIS — M25569 Pain in unspecified knee: Secondary | ICD-10-CM | POA: Diagnosis not present

## 2024-01-18 DIAGNOSIS — M6281 Muscle weakness (generalized): Secondary | ICD-10-CM | POA: Diagnosis not present

## 2024-01-18 DIAGNOSIS — R2681 Unsteadiness on feet: Secondary | ICD-10-CM | POA: Diagnosis not present

## 2024-01-19 DIAGNOSIS — R040 Epistaxis: Secondary | ICD-10-CM | POA: Diagnosis not present

## 2024-01-19 DIAGNOSIS — Z7901 Long term (current) use of anticoagulants: Secondary | ICD-10-CM | POA: Diagnosis not present

## 2024-01-20 DIAGNOSIS — M6281 Muscle weakness (generalized): Secondary | ICD-10-CM | POA: Diagnosis not present

## 2024-01-20 DIAGNOSIS — M25569 Pain in unspecified knee: Secondary | ICD-10-CM | POA: Diagnosis not present

## 2024-01-20 DIAGNOSIS — R2681 Unsteadiness on feet: Secondary | ICD-10-CM | POA: Diagnosis not present

## 2024-01-25 DIAGNOSIS — R2681 Unsteadiness on feet: Secondary | ICD-10-CM | POA: Diagnosis not present

## 2024-01-25 DIAGNOSIS — M25569 Pain in unspecified knee: Secondary | ICD-10-CM | POA: Diagnosis not present

## 2024-01-25 DIAGNOSIS — M6281 Muscle weakness (generalized): Secondary | ICD-10-CM | POA: Diagnosis not present

## 2024-01-27 DIAGNOSIS — M25569 Pain in unspecified knee: Secondary | ICD-10-CM | POA: Diagnosis not present

## 2024-01-27 DIAGNOSIS — M6281 Muscle weakness (generalized): Secondary | ICD-10-CM | POA: Diagnosis not present

## 2024-01-27 DIAGNOSIS — R2681 Unsteadiness on feet: Secondary | ICD-10-CM | POA: Diagnosis not present

## 2024-02-01 DIAGNOSIS — M25569 Pain in unspecified knee: Secondary | ICD-10-CM | POA: Diagnosis not present

## 2024-02-01 DIAGNOSIS — M6281 Muscle weakness (generalized): Secondary | ICD-10-CM | POA: Diagnosis not present

## 2024-02-01 DIAGNOSIS — R2681 Unsteadiness on feet: Secondary | ICD-10-CM | POA: Diagnosis not present

## 2024-02-19 ENCOUNTER — Telehealth: Payer: Self-pay

## 2024-02-19 NOTE — Telephone Encounter (Signed)
 Biotronik Alert:  No messages received for at least 21 days No messages received for 21 days (since last activation). The patient will be deactivated in 68 days.

## 2024-02-19 NOTE — Telephone Encounter (Signed)
 LM for patient to call and discuss ensuring her remote monitor is plugged in and working.

## 2024-02-19 NOTE — Telephone Encounter (Signed)
 Pt nursing home called in stating that they have gotten the monitor and they will plug it in today

## 2024-02-22 ENCOUNTER — Ambulatory Visit

## 2024-02-23 NOTE — Telephone Encounter (Signed)
 02/23/24 - Patient's monitor is still not communicating with website. Spoke with caregiver Dena there who confirms new monitor is plugged in and reading ok in the window.  Waiting on call back from industry to troubleshoot remote monitoring.

## 2024-02-25 NOTE — Telephone Encounter (Signed)
 Spoke with [employer] tech support and pt needs to come in for a reset for monitor. Pt needs to bring in new monitor with her so it can be reset. Per [facility] pt can come in 12/5 in [place] at 1pm

## 2024-03-04 ENCOUNTER — Other Ambulatory Visit: Payer: Self-pay | Admitting: Internal Medicine

## 2024-04-29 ENCOUNTER — Ambulatory Visit: Admitting: Internal Medicine

## 2024-04-29 ENCOUNTER — Ambulatory Visit

## 2024-05-20 ENCOUNTER — Ambulatory Visit: Admitting: Cardiovascular Disease

## 2024-05-20 ENCOUNTER — Ambulatory Visit

## 2024-05-20 ENCOUNTER — Ambulatory Visit: Admitting: Internal Medicine

## 2024-05-23 ENCOUNTER — Ambulatory Visit

## 2024-08-22 ENCOUNTER — Ambulatory Visit

## 2024-11-21 ENCOUNTER — Ambulatory Visit

## 2025-02-20 ENCOUNTER — Ambulatory Visit
